# Patient Record
Sex: Female | Born: 1935
Health system: Southern US, Community
[De-identification: ages and names within clinical notes are randomized; demographics above are authoritative.]

## PROBLEM LIST (undated history)

## (undated) DIAGNOSIS — N189 Chronic kidney disease, unspecified: Secondary | ICD-10-CM

## (undated) DIAGNOSIS — Z950 Presence of cardiac pacemaker: Secondary | ICD-10-CM

## (undated) DIAGNOSIS — K219 Gastro-esophageal reflux disease without esophagitis: Secondary | ICD-10-CM

## (undated) DIAGNOSIS — E039 Hypothyroidism, unspecified: Secondary | ICD-10-CM

## (undated) DIAGNOSIS — I480 Paroxysmal atrial fibrillation: Secondary | ICD-10-CM

## (undated) DIAGNOSIS — R809 Proteinuria, unspecified: Secondary | ICD-10-CM

## (undated) DIAGNOSIS — E119 Type 2 diabetes mellitus without complications: Secondary | ICD-10-CM

## (undated) DIAGNOSIS — I10 Essential (primary) hypertension: Secondary | ICD-10-CM

## (undated) DIAGNOSIS — K5790 Diverticulosis of intestine, part unspecified, without perforation or abscess without bleeding: Secondary | ICD-10-CM

## (undated) DIAGNOSIS — E785 Hyperlipidemia, unspecified: Secondary | ICD-10-CM

## (undated) DIAGNOSIS — I639 Cerebral infarction, unspecified: Secondary | ICD-10-CM

## (undated) DIAGNOSIS — I495 Sick sinus syndrome: Secondary | ICD-10-CM

## (undated) HISTORY — DX: Hyperlipidemia, unspecified: E78.5

## (undated) HISTORY — DX: Type 2 diabetes mellitus without complications: E11.9

## (undated) HISTORY — DX: Hypothyroidism, unspecified: E03.9

## (undated) HISTORY — PX: HEMORRHOID SURGERY: SHX153

## (undated) HISTORY — DX: Sick sinus syndrome: I49.5

## (undated) HISTORY — PX: CHOLECYSTECTOMY: SHX55

## (undated) HISTORY — DX: Cerebral infarction, unspecified: I63.9

## (undated) HISTORY — DX: Essential (primary) hypertension: I10

## (undated) HISTORY — PX: OTHER SURGICAL HISTORY: SHX169

## (undated) HISTORY — DX: Proteinuria, unspecified: R80.9

## (undated) HISTORY — DX: Gastro-esophageal reflux disease without esophagitis: K21.9

## (undated) HISTORY — DX: Paroxysmal atrial fibrillation: I48.0

---

## 1997-02-27 DIAGNOSIS — I639 Cerebral infarction, unspecified: Secondary | ICD-10-CM

## 1997-02-27 HISTORY — PX: OTHER SURGICAL HISTORY: SHX169

## 1997-02-27 HISTORY — DX: Cerebral infarction, unspecified: I63.9

## 1997-10-21 ENCOUNTER — Inpatient Hospital Stay (HOSPITAL_COMMUNITY): Admission: RE | Admit: 1997-10-21 | Discharge: 1997-10-24 | Payer: Self-pay | Admitting: Internal Medicine

## 1997-10-27 ENCOUNTER — Inpatient Hospital Stay (HOSPITAL_COMMUNITY): Admission: EM | Admit: 1997-10-27 | Discharge: 1997-11-09 | Payer: Self-pay | Admitting: Internal Medicine

## 1998-04-22 ENCOUNTER — Encounter: Payer: Self-pay | Admitting: Emergency Medicine

## 1998-04-22 ENCOUNTER — Inpatient Hospital Stay (HOSPITAL_COMMUNITY): Admission: EM | Admit: 1998-04-22 | Discharge: 1998-04-23 | Payer: Self-pay | Admitting: Emergency Medicine

## 1999-02-24 ENCOUNTER — Encounter: Payer: Self-pay | Admitting: Gastroenterology

## 1999-02-24 ENCOUNTER — Ambulatory Visit (HOSPITAL_COMMUNITY): Admission: RE | Admit: 1999-02-24 | Discharge: 1999-02-24 | Payer: Self-pay | Admitting: Gastroenterology

## 1999-03-13 ENCOUNTER — Inpatient Hospital Stay (HOSPITAL_COMMUNITY): Admission: EM | Admit: 1999-03-13 | Discharge: 1999-03-13 | Payer: Self-pay | Admitting: Emergency Medicine

## 1999-05-17 ENCOUNTER — Encounter (INDEPENDENT_AMBULATORY_CARE_PROVIDER_SITE_OTHER): Payer: Self-pay

## 1999-05-17 ENCOUNTER — Ambulatory Visit (HOSPITAL_COMMUNITY): Admission: RE | Admit: 1999-05-17 | Discharge: 1999-05-17 | Payer: Self-pay | Admitting: Gastroenterology

## 1999-11-18 ENCOUNTER — Encounter: Admission: RE | Admit: 1999-11-18 | Discharge: 1999-11-18 | Payer: Self-pay

## 1999-11-18 ENCOUNTER — Encounter: Payer: Self-pay | Admitting: Internal Medicine

## 2000-04-01 ENCOUNTER — Emergency Department (HOSPITAL_COMMUNITY): Admission: EM | Admit: 2000-04-01 | Discharge: 2000-04-01 | Payer: Self-pay

## 2000-09-07 ENCOUNTER — Encounter: Payer: Self-pay | Admitting: General Surgery

## 2000-09-10 ENCOUNTER — Encounter (INDEPENDENT_AMBULATORY_CARE_PROVIDER_SITE_OTHER): Payer: Self-pay

## 2000-09-10 ENCOUNTER — Observation Stay (HOSPITAL_COMMUNITY): Admission: RE | Admit: 2000-09-10 | Discharge: 2000-09-11 | Payer: Self-pay | Admitting: General Surgery

## 2002-01-06 ENCOUNTER — Encounter: Payer: Self-pay | Admitting: Family Medicine

## 2002-01-06 ENCOUNTER — Encounter: Admission: RE | Admit: 2002-01-06 | Discharge: 2002-01-06 | Payer: Self-pay | Admitting: Family Medicine

## 2003-01-20 ENCOUNTER — Encounter: Admission: RE | Admit: 2003-01-20 | Discharge: 2003-01-20 | Payer: Self-pay | Admitting: Nurse Practitioner

## 2003-07-10 ENCOUNTER — Ambulatory Visit (HOSPITAL_COMMUNITY): Admission: RE | Admit: 2003-07-10 | Discharge: 2003-07-10 | Payer: Self-pay | Admitting: Internal Medicine

## 2003-10-29 ENCOUNTER — Ambulatory Visit: Payer: Self-pay | Admitting: Nurse Practitioner

## 2003-11-30 ENCOUNTER — Ambulatory Visit: Payer: Self-pay | Admitting: Nurse Practitioner

## 2003-12-21 ENCOUNTER — Ambulatory Visit: Payer: Self-pay | Admitting: Nurse Practitioner

## 2003-12-29 ENCOUNTER — Encounter (INDEPENDENT_AMBULATORY_CARE_PROVIDER_SITE_OTHER): Payer: Self-pay | Admitting: Nurse Practitioner

## 2003-12-29 LAB — CONVERTED CEMR LAB

## 2003-12-30 ENCOUNTER — Ambulatory Visit: Payer: Self-pay | Admitting: Nurse Practitioner

## 2004-01-05 ENCOUNTER — Encounter (INDEPENDENT_AMBULATORY_CARE_PROVIDER_SITE_OTHER): Payer: Self-pay | Admitting: Nurse Practitioner

## 2004-01-05 ENCOUNTER — Other Ambulatory Visit: Admission: RE | Admit: 2004-01-05 | Discharge: 2004-01-05 | Payer: Self-pay | Admitting: Family Medicine

## 2004-01-05 LAB — CONVERTED CEMR LAB: Pap Smear: NORMAL

## 2004-01-28 ENCOUNTER — Ambulatory Visit: Payer: Self-pay | Admitting: Nurse Practitioner

## 2004-01-29 ENCOUNTER — Encounter: Admission: RE | Admit: 2004-01-29 | Discharge: 2004-01-29 | Payer: Self-pay | Admitting: Family Medicine

## 2004-03-02 ENCOUNTER — Ambulatory Visit: Payer: Self-pay | Admitting: Nurse Practitioner

## 2004-03-30 ENCOUNTER — Ambulatory Visit: Payer: Self-pay | Admitting: Nurse Practitioner

## 2004-04-27 ENCOUNTER — Ambulatory Visit: Payer: Self-pay | Admitting: Nurse Practitioner

## 2004-05-02 ENCOUNTER — Ambulatory Visit (HOSPITAL_COMMUNITY): Admission: RE | Admit: 2004-05-02 | Discharge: 2004-05-02 | Payer: Self-pay | Admitting: Internal Medicine

## 2004-05-02 ENCOUNTER — Ambulatory Visit: Payer: Self-pay | Admitting: Nurse Practitioner

## 2004-05-05 ENCOUNTER — Ambulatory Visit: Payer: Self-pay | Admitting: Nurse Practitioner

## 2004-05-30 ENCOUNTER — Ambulatory Visit: Payer: Self-pay | Admitting: Nurse Practitioner

## 2004-06-29 ENCOUNTER — Ambulatory Visit: Payer: Self-pay | Admitting: Nurse Practitioner

## 2004-07-20 ENCOUNTER — Encounter (INDEPENDENT_AMBULATORY_CARE_PROVIDER_SITE_OTHER): Payer: Self-pay | Admitting: Specialist

## 2004-07-20 ENCOUNTER — Ambulatory Visit (HOSPITAL_COMMUNITY): Admission: RE | Admit: 2004-07-20 | Discharge: 2004-07-20 | Payer: Self-pay | Admitting: Gastroenterology

## 2004-08-01 ENCOUNTER — Ambulatory Visit: Payer: Self-pay | Admitting: Nurse Practitioner

## 2004-09-01 ENCOUNTER — Ambulatory Visit: Payer: Self-pay | Admitting: Nurse Practitioner

## 2004-09-29 ENCOUNTER — Ambulatory Visit: Payer: Self-pay | Admitting: Nurse Practitioner

## 2004-10-13 ENCOUNTER — Ambulatory Visit: Payer: Self-pay | Admitting: Nurse Practitioner

## 2004-11-01 ENCOUNTER — Ambulatory Visit: Payer: Self-pay | Admitting: Nurse Practitioner

## 2004-11-09 ENCOUNTER — Ambulatory Visit (HOSPITAL_COMMUNITY): Admission: RE | Admit: 2004-11-09 | Discharge: 2004-11-09 | Payer: Self-pay | Admitting: *Deleted

## 2004-11-15 ENCOUNTER — Ambulatory Visit: Payer: Self-pay | Admitting: Nurse Practitioner

## 2004-11-22 ENCOUNTER — Ambulatory Visit: Payer: Self-pay | Admitting: Nurse Practitioner

## 2004-12-06 ENCOUNTER — Ambulatory Visit: Payer: Self-pay | Admitting: Nurse Practitioner

## 2005-01-03 ENCOUNTER — Ambulatory Visit: Payer: Self-pay | Admitting: Nurse Practitioner

## 2005-01-17 ENCOUNTER — Ambulatory Visit: Payer: Self-pay | Admitting: Internal Medicine

## 2005-02-13 ENCOUNTER — Ambulatory Visit: Payer: Self-pay | Admitting: Nurse Practitioner

## 2005-03-06 ENCOUNTER — Encounter (INDEPENDENT_AMBULATORY_CARE_PROVIDER_SITE_OTHER): Payer: Self-pay | Admitting: Nurse Practitioner

## 2005-03-06 LAB — CONVERTED CEMR LAB: WBC, blood: 7 10*3/uL

## 2005-03-20 ENCOUNTER — Ambulatory Visit: Payer: Self-pay | Admitting: Nurse Practitioner

## 2005-03-22 ENCOUNTER — Ambulatory Visit: Payer: Self-pay | Admitting: Nurse Practitioner

## 2005-04-10 ENCOUNTER — Encounter: Admission: RE | Admit: 2005-04-10 | Discharge: 2005-04-10 | Payer: Self-pay | Admitting: Family Medicine

## 2005-04-20 ENCOUNTER — Ambulatory Visit: Payer: Self-pay | Admitting: Nurse Practitioner

## 2005-05-22 ENCOUNTER — Ambulatory Visit: Payer: Self-pay | Admitting: Nurse Practitioner

## 2005-06-26 ENCOUNTER — Ambulatory Visit: Payer: Self-pay | Admitting: Internal Medicine

## 2005-07-25 ENCOUNTER — Ambulatory Visit: Payer: Self-pay | Admitting: Nurse Practitioner

## 2005-07-31 ENCOUNTER — Ambulatory Visit: Payer: Self-pay | Admitting: Internal Medicine

## 2005-08-02 ENCOUNTER — Ambulatory Visit: Payer: Self-pay | Admitting: Nurse Practitioner

## 2005-08-24 ENCOUNTER — Ambulatory Visit: Payer: Self-pay | Admitting: Nurse Practitioner

## 2005-08-28 ENCOUNTER — Ambulatory Visit: Payer: Self-pay | Admitting: Nurse Practitioner

## 2005-09-20 ENCOUNTER — Ambulatory Visit: Payer: Self-pay | Admitting: Nurse Practitioner

## 2005-10-23 ENCOUNTER — Ambulatory Visit: Payer: Self-pay | Admitting: Nurse Practitioner

## 2005-10-31 ENCOUNTER — Ambulatory Visit: Payer: Self-pay | Admitting: Nurse Practitioner

## 2005-11-14 ENCOUNTER — Ambulatory Visit: Payer: Self-pay | Admitting: Nurse Practitioner

## 2005-11-28 ENCOUNTER — Ambulatory Visit: Payer: Self-pay | Admitting: Nurse Practitioner

## 2005-12-01 ENCOUNTER — Ambulatory Visit: Payer: Self-pay | Admitting: Nurse Practitioner

## 2005-12-01 LAB — CONVERTED CEMR LAB: Hgb A1c MFr Bld: 7.7 %

## 2005-12-15 ENCOUNTER — Ambulatory Visit: Payer: Self-pay | Admitting: Nurse Practitioner

## 2005-12-15 LAB — CONVERTED CEMR LAB: Blood Glucose, Fasting: 175 mg/dL

## 2006-01-15 ENCOUNTER — Ambulatory Visit: Payer: Self-pay | Admitting: Nurse Practitioner

## 2006-02-12 ENCOUNTER — Ambulatory Visit: Payer: Self-pay | Admitting: Nurse Practitioner

## 2006-03-05 ENCOUNTER — Ambulatory Visit: Payer: Self-pay | Admitting: Nurse Practitioner

## 2006-03-05 LAB — CONVERTED CEMR LAB: TSH: 3.436 microintl units/mL

## 2006-03-06 ENCOUNTER — Encounter (INDEPENDENT_AMBULATORY_CARE_PROVIDER_SITE_OTHER): Payer: Self-pay | Admitting: Nurse Practitioner

## 2006-03-06 LAB — CONVERTED CEMR LAB
RBC count: 4.91 10*6/uL
WBC, blood: 4.91 10*3/uL
WBC, blood: 7 10*3/uL

## 2006-03-27 ENCOUNTER — Ambulatory Visit: Payer: Self-pay | Admitting: Nurse Practitioner

## 2006-04-02 ENCOUNTER — Ambulatory Visit: Payer: Self-pay | Admitting: Nurse Practitioner

## 2006-04-30 ENCOUNTER — Ambulatory Visit: Payer: Self-pay | Admitting: Nurse Practitioner

## 2006-04-30 LAB — CONVERTED CEMR LAB: INR: 3.3

## 2006-05-16 ENCOUNTER — Encounter (INDEPENDENT_AMBULATORY_CARE_PROVIDER_SITE_OTHER): Payer: Self-pay | Admitting: Nurse Practitioner

## 2006-05-16 DIAGNOSIS — I6789 Other cerebrovascular disease: Secondary | ICD-10-CM | POA: Insufficient documentation

## 2006-05-16 DIAGNOSIS — E119 Type 2 diabetes mellitus without complications: Secondary | ICD-10-CM | POA: Insufficient documentation

## 2006-05-16 DIAGNOSIS — K649 Unspecified hemorrhoids: Secondary | ICD-10-CM | POA: Insufficient documentation

## 2006-05-17 ENCOUNTER — Encounter: Admission: RE | Admit: 2006-05-17 | Discharge: 2006-05-17 | Payer: Self-pay | Admitting: Nurse Practitioner

## 2006-05-28 ENCOUNTER — Ambulatory Visit: Payer: Self-pay | Admitting: Nurse Practitioner

## 2006-06-04 ENCOUNTER — Ambulatory Visit: Payer: Self-pay | Admitting: Nurse Practitioner

## 2006-06-25 ENCOUNTER — Ambulatory Visit: Payer: Self-pay | Admitting: Nurse Practitioner

## 2006-07-25 ENCOUNTER — Ambulatory Visit: Payer: Self-pay | Admitting: Nurse Practitioner

## 2006-07-28 DIAGNOSIS — I10 Essential (primary) hypertension: Secondary | ICD-10-CM | POA: Insufficient documentation

## 2006-07-28 DIAGNOSIS — I428 Other cardiomyopathies: Secondary | ICD-10-CM | POA: Insufficient documentation

## 2006-07-28 DIAGNOSIS — E785 Hyperlipidemia, unspecified: Secondary | ICD-10-CM | POA: Insufficient documentation

## 2006-08-22 ENCOUNTER — Ambulatory Visit: Payer: Self-pay | Admitting: Family Medicine

## 2006-09-19 ENCOUNTER — Ambulatory Visit: Payer: Self-pay | Admitting: Nurse Practitioner

## 2006-10-02 ENCOUNTER — Ambulatory Visit: Payer: Self-pay | Admitting: Nurse Practitioner

## 2006-10-16 ENCOUNTER — Ambulatory Visit: Payer: Self-pay | Admitting: Nurse Practitioner

## 2006-11-13 ENCOUNTER — Ambulatory Visit: Payer: Self-pay | Admitting: Nurse Practitioner

## 2006-12-18 ENCOUNTER — Encounter (INDEPENDENT_AMBULATORY_CARE_PROVIDER_SITE_OTHER): Payer: Self-pay | Admitting: Nurse Practitioner

## 2006-12-18 ENCOUNTER — Ambulatory Visit: Payer: Self-pay | Admitting: Internal Medicine

## 2006-12-18 LAB — CONVERTED CEMR LAB
ALT: 19 units/L (ref 0–35)
AST: 19 units/L (ref 0–37)
Albumin: 4.2 g/dL (ref 3.5–5.2)
Alkaline Phosphatase: 75 units/L (ref 39–117)
BUN: 16 mg/dL (ref 6–23)
Basophils Absolute: 0.1 10*3/uL (ref 0.0–0.1)
Basophils Relative: 1 % (ref 0–1)
CO2: 22 meq/L (ref 19–32)
Calcium: 9 mg/dL (ref 8.4–10.5)
Chloride: 103 meq/L (ref 96–112)
Cholesterol: 182 mg/dL (ref 0–200)
Creatinine, Ser: 0.83 mg/dL (ref 0.40–1.20)
Eosinophils Absolute: 0.5 10*3/uL (ref 0.0–0.7)
Eosinophils Relative: 9 % — ABNORMAL HIGH (ref 0–5)
Glucose, Bld: 188 mg/dL — ABNORMAL HIGH (ref 70–99)
HCT: 43.1 % (ref 36.0–46.0)
HDL: 42 mg/dL (ref >39)
Hemoglobin: 13.6 g/dL (ref 12.0–15.0)
LDL Cholesterol: 87 mg/dL (ref 0–99)
Lymphocytes Relative: 22 % (ref 12–46)
Lymphs Abs: 1.2 10*3/uL (ref 0.7–3.3)
MCHC: 31.6 g/dL (ref 30.0–36.0)
MCV: 87.1 fL (ref 78.0–100.0)
Monocytes Absolute: 0.4 10*3/uL (ref 0.2–0.7)
Monocytes Relative: 8 % (ref 3–11)
Neutro Abs: 3.3 10*3/uL (ref 1.7–7.7)
Neutrophils Relative %: 60 % (ref 43–77)
Platelets: 224 10*3/uL (ref 150–400)
Potassium: 4.4 meq/L (ref 3.5–5.3)
RBC: 4.95 M/uL (ref 3.87–5.11)
RDW: 16 % — ABNORMAL HIGH (ref 11.5–14.0)
Sodium: 142 meq/L (ref 135–145)
TSH: 5.953 microintl units/mL — ABNORMAL HIGH (ref 0.350–5.50)
Total Bilirubin: 0.5 mg/dL (ref 0.3–1.2)
Total CHOL/HDL Ratio: 4.3
Total Protein: 7.5 g/dL (ref 6.0–8.3)
Triglycerides: 265 mg/dL — ABNORMAL HIGH (ref <150)
VLDL: 53 mg/dL — ABNORMAL HIGH (ref 0–40)
WBC: 5.5 10*3/uL (ref 4.0–10.5)

## 2007-01-15 ENCOUNTER — Ambulatory Visit: Payer: Self-pay | Admitting: Nurse Practitioner

## 2007-02-12 ENCOUNTER — Ambulatory Visit: Payer: Self-pay | Admitting: Nurse Practitioner

## 2007-02-19 ENCOUNTER — Ambulatory Visit: Payer: Self-pay | Admitting: Nurse Practitioner

## 2007-02-27 ENCOUNTER — Ambulatory Visit: Payer: Self-pay | Admitting: Nurse Practitioner

## 2007-03-27 ENCOUNTER — Ambulatory Visit: Payer: Self-pay | Admitting: Nurse Practitioner

## 2007-04-12 ENCOUNTER — Ambulatory Visit: Payer: Self-pay | Admitting: Internal Medicine

## 2007-04-12 ENCOUNTER — Encounter (INDEPENDENT_AMBULATORY_CARE_PROVIDER_SITE_OTHER): Payer: Self-pay | Admitting: Nurse Practitioner

## 2007-04-12 LAB — CONVERTED CEMR LAB
ALT: 30 units/L (ref 0–35)
AST: 16 units/L (ref 0–37)
Albumin: 4.1 g/dL (ref 3.5–5.2)
Alkaline Phosphatase: 82 units/L (ref 39–117)
BUN: 16 mg/dL (ref 6–23)
Basophils Absolute: 0 10*3/uL (ref 0.0–0.1)
Basophils Relative: 1 % (ref 0–1)
CO2: 19 meq/L (ref 19–32)
Calcium: 9.4 mg/dL (ref 8.4–10.5)
Chloride: 100 meq/L (ref 96–112)
Creatinine, Ser: 0.76 mg/dL (ref 0.40–1.20)
Eosinophils Absolute: 0.5 10*3/uL (ref 0.0–0.7)
Eosinophils Relative: 7 % — ABNORMAL HIGH (ref 0–5)
Glucose, Bld: 193 mg/dL — ABNORMAL HIGH (ref 70–99)
HCT: 41.4 % (ref 36.0–46.0)
Hemoglobin: 13.2 g/dL (ref 12.0–15.0)
Lymphocytes Relative: 20 % (ref 12–46)
Lymphs Abs: 1.5 10*3/uL (ref 0.7–4.0)
MCHC: 31.9 g/dL (ref 30.0–36.0)
MCV: 87.2 fL (ref 78.0–100.0)
Monocytes Absolute: 0.5 10*3/uL (ref 0.1–1.0)
Monocytes Relative: 6 % (ref 3–12)
Neutro Abs: 5.1 10*3/uL (ref 1.7–7.7)
Neutrophils Relative %: 67 % (ref 43–77)
Platelets: 215 10*3/uL (ref 150–400)
Potassium: 4.2 meq/L (ref 3.5–5.3)
RBC: 4.75 M/uL (ref 3.87–5.11)
RDW: 15.8 % — ABNORMAL HIGH (ref 11.5–15.5)
Sodium: 133 meq/L — ABNORMAL LOW (ref 135–145)
TSH: 3.208 microintl units/mL (ref 0.350–5.50)
Total Bilirubin: 0.5 mg/dL (ref 0.3–1.2)
Total Protein: 7.5 g/dL (ref 6.0–8.3)
WBC: 7.6 10*3/uL (ref 4.0–10.5)

## 2007-04-24 ENCOUNTER — Ambulatory Visit: Payer: Self-pay | Admitting: Internal Medicine

## 2007-05-22 ENCOUNTER — Ambulatory Visit: Payer: Self-pay | Admitting: Nurse Practitioner

## 2007-05-29 ENCOUNTER — Encounter: Admission: RE | Admit: 2007-05-29 | Discharge: 2007-05-29 | Payer: Self-pay | Admitting: Family Medicine

## 2007-06-05 ENCOUNTER — Ambulatory Visit: Payer: Self-pay | Admitting: Internal Medicine

## 2007-07-03 ENCOUNTER — Ambulatory Visit: Payer: Self-pay | Admitting: Nurse Practitioner

## 2007-07-31 ENCOUNTER — Ambulatory Visit: Payer: Self-pay | Admitting: Nurse Practitioner

## 2007-08-28 ENCOUNTER — Ambulatory Visit: Payer: Self-pay | Admitting: Internal Medicine

## 2007-09-25 ENCOUNTER — Ambulatory Visit: Payer: Self-pay | Admitting: Family Medicine

## 2007-09-25 ENCOUNTER — Encounter (INDEPENDENT_AMBULATORY_CARE_PROVIDER_SITE_OTHER): Payer: Self-pay | Admitting: Internal Medicine

## 2007-09-25 LAB — CONVERTED CEMR LAB
INR: 2.4 — ABNORMAL HIGH (ref 0.0–1.5)
Prothrombin Time: 27.7 s — ABNORMAL HIGH (ref 11.6–15.2)

## 2007-10-23 ENCOUNTER — Ambulatory Visit: Payer: Self-pay | Admitting: Internal Medicine

## 2007-10-23 LAB — CONVERTED CEMR LAB
INR: 2.4 — ABNORMAL HIGH (ref 0.0–1.5)
Prothrombin Time: 27.4 s — ABNORMAL HIGH (ref 11.6–15.2)

## 2007-11-20 ENCOUNTER — Ambulatory Visit: Payer: Self-pay | Admitting: Family Medicine

## 2007-11-20 ENCOUNTER — Encounter (INDEPENDENT_AMBULATORY_CARE_PROVIDER_SITE_OTHER): Payer: Self-pay | Admitting: Internal Medicine

## 2007-11-20 LAB — CONVERTED CEMR LAB
INR: 2.4 — ABNORMAL HIGH (ref 0.0–1.5)
Prothrombin Time: 27.6 s — ABNORMAL HIGH (ref 11.6–15.2)

## 2007-12-18 ENCOUNTER — Ambulatory Visit: Payer: Self-pay | Admitting: Internal Medicine

## 2007-12-18 ENCOUNTER — Encounter (INDEPENDENT_AMBULATORY_CARE_PROVIDER_SITE_OTHER): Payer: Self-pay | Admitting: Internal Medicine

## 2007-12-18 ENCOUNTER — Ambulatory Visit: Payer: Self-pay | Admitting: Family Medicine

## 2007-12-18 LAB — CONVERTED CEMR LAB
Basophils Absolute: 0 10*3/uL (ref 0.0–0.1)
Basophils Relative: 1 % (ref 0–1)
Eosinophils Absolute: 0.6 10*3/uL (ref 0.0–0.7)
Eosinophils Relative: 9 % — ABNORMAL HIGH (ref 0–5)
HCT: 40.1 % (ref 36.0–46.0)
Hemoglobin: 12.8 g/dL (ref 12.0–15.0)
INR: 2.4 — ABNORMAL HIGH (ref 0.0–1.5)
Lymphocytes Relative: 21 % (ref 12–46)
Lymphs Abs: 1.5 10*3/uL (ref 0.7–4.0)
MCHC: 31.9 g/dL (ref 30.0–36.0)
MCV: 83.5 fL (ref 78.0–100.0)
Monocytes Absolute: 0.4 10*3/uL (ref 0.1–1.0)
Monocytes Relative: 6 % (ref 3–12)
Neutro Abs: 4.5 10*3/uL (ref 1.7–7.7)
Neutrophils Relative %: 64 % (ref 43–77)
Platelets: 238 10*3/uL (ref 150–400)
Prothrombin Time: 28.1 s — ABNORMAL HIGH (ref 11.6–15.2)
RBC: 4.8 M/uL (ref 3.87–5.11)
RDW: 16.4 % — ABNORMAL HIGH (ref 11.5–15.5)
TSH: 2.738 microintl units/mL (ref 0.350–4.50)
WBC: 7.1 10*3/uL (ref 4.0–10.5)

## 2008-01-15 ENCOUNTER — Ambulatory Visit: Payer: Self-pay | Admitting: Family Medicine

## 2008-01-15 ENCOUNTER — Encounter (INDEPENDENT_AMBULATORY_CARE_PROVIDER_SITE_OTHER): Payer: Self-pay | Admitting: Internal Medicine

## 2008-01-15 LAB — CONVERTED CEMR LAB
INR: 2.6 — ABNORMAL HIGH (ref 0.0–1.5)
Prothrombin Time: 30 s — ABNORMAL HIGH (ref 11.6–15.2)

## 2008-02-06 ENCOUNTER — Ambulatory Visit: Payer: Self-pay | Admitting: Family Medicine

## 2008-03-04 ENCOUNTER — Ambulatory Visit: Payer: Self-pay | Admitting: Internal Medicine

## 2008-03-04 ENCOUNTER — Encounter (INDEPENDENT_AMBULATORY_CARE_PROVIDER_SITE_OTHER): Payer: Self-pay | Admitting: Internal Medicine

## 2008-03-04 LAB — CONVERTED CEMR LAB
ALT: 16 units/L (ref 0–35)
AST: 12 units/L (ref 0–37)
Albumin: 4 g/dL (ref 3.5–5.2)
Alkaline Phosphatase: 73 units/L (ref 39–117)
BUN: 12 mg/dL (ref 6–23)
CO2: 24 meq/L (ref 19–32)
Calcium: 9 mg/dL (ref 8.4–10.5)
Chloride: 105 meq/L (ref 96–112)
Cholesterol: 98 mg/dL (ref 0–200)
Creatinine, Ser: 0.74 mg/dL (ref 0.40–1.20)
Glucose, Bld: 172 mg/dL — ABNORMAL HIGH (ref 70–99)
HDL: 50 mg/dL (ref >39)
LDL Cholesterol: 28 mg/dL (ref 0–99)
Potassium: 4.7 meq/L (ref 3.5–5.3)
Sodium: 142 meq/L (ref 135–145)
Total Bilirubin: 0.6 mg/dL (ref 0.3–1.2)
Total CHOL/HDL Ratio: 2
Total Protein: 7.3 g/dL (ref 6.0–8.3)
Triglycerides: 99 mg/dL (ref <150)
VLDL: 20 mg/dL (ref 0–40)

## 2008-03-12 ENCOUNTER — Ambulatory Visit (HOSPITAL_COMMUNITY): Admission: RE | Admit: 2008-03-12 | Discharge: 2008-03-12 | Payer: Self-pay | Admitting: Family Medicine

## 2008-04-01 ENCOUNTER — Ambulatory Visit: Payer: Self-pay | Admitting: Internal Medicine

## 2008-04-29 ENCOUNTER — Ambulatory Visit: Payer: Self-pay | Admitting: Internal Medicine

## 2008-06-01 ENCOUNTER — Ambulatory Visit: Payer: Self-pay | Admitting: Internal Medicine

## 2008-06-15 ENCOUNTER — Ambulatory Visit: Payer: Self-pay | Admitting: *Deleted

## 2008-06-18 ENCOUNTER — Encounter: Admission: RE | Admit: 2008-06-18 | Discharge: 2008-06-18 | Payer: Self-pay | Admitting: Family Medicine

## 2008-07-06 ENCOUNTER — Ambulatory Visit: Payer: Self-pay | Admitting: Internal Medicine

## 2008-08-03 ENCOUNTER — Ambulatory Visit: Payer: Self-pay | Admitting: Internal Medicine

## 2008-09-01 ENCOUNTER — Ambulatory Visit: Payer: Self-pay | Admitting: Internal Medicine

## 2008-10-05 ENCOUNTER — Ambulatory Visit: Payer: Self-pay | Admitting: Family Medicine

## 2008-10-05 ENCOUNTER — Ambulatory Visit: Payer: Self-pay | Admitting: Internal Medicine

## 2008-10-27 ENCOUNTER — Ambulatory Visit: Payer: Self-pay | Admitting: Internal Medicine

## 2008-11-10 ENCOUNTER — Ambulatory Visit: Payer: Self-pay | Admitting: Family Medicine

## 2008-11-24 ENCOUNTER — Ambulatory Visit: Payer: Self-pay | Admitting: Family Medicine

## 2008-12-14 ENCOUNTER — Encounter (INDEPENDENT_AMBULATORY_CARE_PROVIDER_SITE_OTHER): Payer: Self-pay | Admitting: Internal Medicine

## 2008-12-14 ENCOUNTER — Ambulatory Visit: Payer: Self-pay | Admitting: Internal Medicine

## 2008-12-14 ENCOUNTER — Ambulatory Visit: Payer: Self-pay | Admitting: Family Medicine

## 2008-12-14 LAB — CONVERTED CEMR LAB
ALT: 16 units/L (ref 0–35)
AST: 14 units/L (ref 0–37)
Albumin: 4.5 g/dL (ref 3.5–5.2)
Alkaline Phosphatase: 61 units/L (ref 39–117)
BUN: 19 mg/dL (ref 6–23)
CO2: 20 meq/L (ref 19–32)
Calcium: 9.7 mg/dL (ref 8.4–10.5)
Chloride: 107 meq/L (ref 96–112)
Cholesterol: 92 mg/dL (ref 0–200)
Creatinine, Ser: 0.89 mg/dL (ref 0.40–1.20)
Glucose, Bld: 155 mg/dL — ABNORMAL HIGH (ref 70–99)
HDL: 39 mg/dL — ABNORMAL LOW (ref >39)
LDL Cholesterol: 28 mg/dL (ref 0–99)
Potassium: 4.8 meq/L (ref 3.5–5.3)
Sodium: 144 meq/L (ref 135–145)
Total Bilirubin: 0.5 mg/dL (ref 0.3–1.2)
Total CHOL/HDL Ratio: 2.4
Total Protein: 7.3 g/dL (ref 6.0–8.3)
Triglycerides: 123 mg/dL (ref <150)
VLDL: 25 mg/dL (ref 0–40)

## 2008-12-16 ENCOUNTER — Encounter (INDEPENDENT_AMBULATORY_CARE_PROVIDER_SITE_OTHER): Payer: Self-pay | Admitting: Internal Medicine

## 2008-12-16 LAB — CONVERTED CEMR LAB: TSH: 2.393 microintl units/mL (ref 0.350–4.500)

## 2008-12-28 ENCOUNTER — Ambulatory Visit: Payer: Self-pay | Admitting: Internal Medicine

## 2008-12-28 ENCOUNTER — Ambulatory Visit: Payer: Self-pay | Admitting: Family Medicine

## 2009-01-25 ENCOUNTER — Ambulatory Visit: Payer: Self-pay | Admitting: Internal Medicine

## 2009-02-24 ENCOUNTER — Ambulatory Visit: Payer: Self-pay | Admitting: Internal Medicine

## 2009-03-24 ENCOUNTER — Ambulatory Visit: Payer: Self-pay | Admitting: Internal Medicine

## 2009-03-31 ENCOUNTER — Ambulatory Visit: Payer: Self-pay | Admitting: Family Medicine

## 2009-03-31 ENCOUNTER — Encounter (INDEPENDENT_AMBULATORY_CARE_PROVIDER_SITE_OTHER): Payer: Self-pay | Admitting: Adult Health

## 2009-04-12 ENCOUNTER — Ambulatory Visit: Payer: Self-pay | Admitting: Internal Medicine

## 2009-04-20 ENCOUNTER — Ambulatory Visit: Payer: Self-pay | Admitting: Internal Medicine

## 2009-04-21 ENCOUNTER — Ambulatory Visit: Payer: Self-pay | Admitting: Family Medicine

## 2009-04-21 ENCOUNTER — Encounter (INDEPENDENT_AMBULATORY_CARE_PROVIDER_SITE_OTHER): Payer: Self-pay | Admitting: Family Medicine

## 2009-05-05 ENCOUNTER — Ambulatory Visit: Payer: Self-pay | Admitting: Family Medicine

## 2009-05-19 ENCOUNTER — Ambulatory Visit: Payer: Self-pay | Admitting: Family Medicine

## 2009-06-08 ENCOUNTER — Ambulatory Visit: Payer: Self-pay | Admitting: Internal Medicine

## 2009-06-08 LAB — CONVERTED CEMR LAB
Cholesterol: 95 mg/dL (ref 0–200)
HDL: 43 mg/dL (ref >39)
Hgb A1c MFr Bld: 8.1 % — ABNORMAL HIGH (ref 4.6–6.1)
LDL Cholesterol: 35 mg/dL (ref 0–99)
Microalb, Ur: 0.51 mg/dL (ref 0.00–1.89)
Total CHOL/HDL Ratio: 2.2
Triglycerides: 87 mg/dL (ref <150)
VLDL: 17 mg/dL (ref 0–40)

## 2009-06-14 ENCOUNTER — Ambulatory Visit: Payer: Self-pay | Admitting: Internal Medicine

## 2009-06-14 LAB — CONVERTED CEMR LAB
BUN: 17 mg/dL (ref 6–23)
CO2: 21 meq/L (ref 19–32)
Calcium: 9.7 mg/dL (ref 8.4–10.5)
Chloride: 105 meq/L (ref 96–112)
Creatinine, Ser: 0.82 mg/dL (ref 0.40–1.20)
Glucose, Bld: 210 mg/dL — ABNORMAL HIGH (ref 70–99)
Potassium: 4.5 meq/L (ref 3.5–5.3)
Sodium: 139 meq/L (ref 135–145)

## 2009-06-22 ENCOUNTER — Encounter: Admission: RE | Admit: 2009-06-22 | Discharge: 2009-06-22 | Payer: Self-pay | Admitting: Family Medicine

## 2009-06-28 ENCOUNTER — Ambulatory Visit: Payer: Self-pay | Admitting: Internal Medicine

## 2009-07-27 ENCOUNTER — Ambulatory Visit: Payer: Self-pay | Admitting: Internal Medicine

## 2009-08-24 ENCOUNTER — Ambulatory Visit: Payer: Self-pay | Admitting: Internal Medicine

## 2009-08-31 ENCOUNTER — Ambulatory Visit: Payer: Self-pay | Admitting: Internal Medicine

## 2009-08-31 LAB — CONVERTED CEMR LAB
Direct LDL: 45 mg/dL
Hgb A1c MFr Bld: 7.9 % — ABNORMAL HIGH (ref <5.7)

## 2009-09-21 ENCOUNTER — Ambulatory Visit: Payer: Self-pay | Admitting: Internal Medicine

## 2009-10-05 ENCOUNTER — Ambulatory Visit: Payer: Self-pay | Admitting: Internal Medicine

## 2009-11-02 ENCOUNTER — Ambulatory Visit: Payer: Self-pay | Admitting: Internal Medicine

## 2009-11-09 ENCOUNTER — Ambulatory Visit: Payer: Self-pay | Admitting: Internal Medicine

## 2009-11-09 LAB — CONVERTED CEMR LAB
INR: 1.96 — ABNORMAL HIGH (ref <1.50)
Prothrombin Time: 22.5 s — ABNORMAL HIGH (ref 11.6–15.2)

## 2009-11-20 ENCOUNTER — Encounter: Payer: Self-pay | Admitting: Internal Medicine

## 2009-11-23 ENCOUNTER — Encounter (INDEPENDENT_AMBULATORY_CARE_PROVIDER_SITE_OTHER): Payer: Self-pay | Admitting: Internal Medicine

## 2009-11-23 LAB — CONVERTED CEMR LAB
INR: 1.89 — ABNORMAL HIGH (ref <1.50)
Prothrombin Time: 21.9 s — ABNORMAL HIGH (ref 11.6–15.2)

## 2009-12-06 ENCOUNTER — Ambulatory Visit: Payer: Self-pay | Admitting: Internal Medicine

## 2009-12-07 ENCOUNTER — Encounter (INDEPENDENT_AMBULATORY_CARE_PROVIDER_SITE_OTHER): Payer: Self-pay | Admitting: *Deleted

## 2009-12-07 LAB — CONVERTED CEMR LAB
INR: 2.38 — ABNORMAL HIGH (ref <1.50)
Prothrombin Time: 26.1 s — ABNORMAL HIGH (ref 11.6–15.2)

## 2009-12-28 ENCOUNTER — Encounter (INDEPENDENT_AMBULATORY_CARE_PROVIDER_SITE_OTHER): Payer: Self-pay | Admitting: Family Medicine

## 2009-12-28 LAB — CONVERTED CEMR LAB
INR: 2.36 — ABNORMAL HIGH (ref <1.50)
Prothrombin Time: 25.9 s — ABNORMAL HIGH (ref 11.6–15.2)

## 2010-01-05 ENCOUNTER — Ambulatory Visit: Payer: Self-pay | Admitting: Internal Medicine

## 2010-01-05 DIAGNOSIS — I4891 Unspecified atrial fibrillation: Secondary | ICD-10-CM | POA: Insufficient documentation

## 2010-03-20 ENCOUNTER — Encounter: Payer: Self-pay | Admitting: Family Medicine

## 2010-03-30 ENCOUNTER — Encounter (INDEPENDENT_AMBULATORY_CARE_PROVIDER_SITE_OTHER): Payer: Self-pay | Admitting: *Deleted

## 2010-03-30 LAB — CONVERTED CEMR LAB
ALT: 17 units/L (ref 0–35)
AST: 19 units/L (ref 0–37)
Albumin: 4.2 g/dL (ref 3.5–5.2)
Alkaline Phosphatase: 73 units/L (ref 39–117)
BUN: 17 mg/dL (ref 6–23)
Basophils Absolute: 0 10*3/uL (ref 0.0–0.1)
Basophils Relative: 1 % (ref 0–1)
CO2: 23 meq/L (ref 19–32)
Calcium: 9.9 mg/dL (ref 8.4–10.5)
Chloride: 103 meq/L (ref 96–112)
Creatinine, Ser: 0.92 mg/dL (ref 0.40–1.20)
Eosinophils Absolute: 0.4 10*3/uL (ref 0.0–0.7)
Eosinophils Relative: 7 % — ABNORMAL HIGH (ref 0–5)
Glucose, Bld: 207 mg/dL — ABNORMAL HIGH (ref 70–99)
HCT: 39.5 % (ref 36.0–46.0)
Hemoglobin: 12.8 g/dL (ref 12.0–15.0)
Lymphocytes Relative: 17 % (ref 12–46)
Lymphs Abs: 1 10*3/uL (ref 0.7–4.0)
MCHC: 32.4 g/dL (ref 30.0–36.0)
MCV: 84.8 fL (ref 78.0–100.0)
Monocytes Absolute: 0.4 10*3/uL (ref 0.1–1.0)
Monocytes Relative: 7 % (ref 3–12)
Neutro Abs: 4 10*3/uL (ref 1.7–7.7)
Neutrophils Relative %: 68 % (ref 43–77)
Platelets: 211 10*3/uL (ref 150–400)
Potassium: 4.5 meq/L (ref 3.5–5.3)
RBC: 4.66 M/uL (ref 3.87–5.11)
RDW: 16.2 % — ABNORMAL HIGH (ref 11.5–15.5)
Sodium: 140 meq/L (ref 135–145)
Total Bilirubin: 0.4 mg/dL (ref 0.3–1.2)
Total Protein: 7.5 g/dL (ref 6.0–8.3)
WBC: 5.9 10*3/uL (ref 4.0–10.5)

## 2010-03-31 NOTE — Cardiovascular Report (Signed)
Summary: Office Visit   Office Visit   Imported By: Sallee Provencal 01/14/2010 16:40:57  _____________________________________________________________________  External Attachment:    Type:   Image     Comment:   External Document

## 2010-03-31 NOTE — Procedures (Signed)
Summary: pacer check/medtronic   Current Medications (verified): 1)  Tiazac 180 Mg Cp24 (Diltiazem Hcl Er Beads) .... Once Daily 2)  Glucophage 500 Mg Tabs (Metformin Hcl) .... Bid 3)  Toprol Xl 50 Mg Tb24 (Metoprolol Succinate) .... Take 1 Tablet By Mouth Two Times A Day 4)  Ranitidine Hcl 150 Mg Caps (Ranitidine Hcl) .... Two Times A Day 5)  Crestor 10 Mg Tabs (Rosuvastatin Calcium) .... Once Daily 6)  Klor-Con 20 Meq Pack (Potassium Chloride) .... Once Daily 7)  Furosemide 20 Mg Tabs (Furosemide) .Marland Kitchen.. 1 Tablet By Mouth Once Daily 8)  Coumadin  Tabs (Warfarin Sodium Tabs) .... Dose Per Pharmacy 9)  Levothyroxine Sodium 25 Mcg Tabs (Levothyroxine Sodium) .... Take 1 Tablet By Mouth Once Daily  Allergies (verified): No Known Drug Allergies  PPM Specifications Following MD:  Thompson Grayer, MD     Referring MD:  Geanie Cooley PPM Vendor:  Medtronic     PPM Model Number:  [id number]     PPM Serial Number:  [id number] PPM DOI:  11/09/2004     PPM Implanting MD:  Sinda Du  Lead 1    Location: RA     DOI: 11/03/1997     Model #: 438-01     Serial #: [id number]     Status: active Lead 2    Location: RV     DOI: 11/03/1997     Model #: [id number]     Serial #: [id number]     Status: active  Magnet Response Rate:  BOL 85 ERI 65  Indications:  Brady   PPM Follow Up Battery Voltage:  2.95 V       PPM Device Measurements Atrium  Amplitude: 0.2 mV, Impedance: 312 ohms, Threshold: 0.5 V at 0.4 msec Right Ventricle  Amplitude: 6.4 mV, Impedance: 272 ohms, Threshold: 1.5 V at 0.4 msec  Episodes MS Episodes:  462     Percent Mode Switch:  19.3%     Ventricular High Rate:  9     Atrial Pacing:  64.8%     Ventricular Pacing:  14.4%  Parameters Mode:  MVP     Lower Rate Limit:  60     Upper Rate Limit:  130 Paced AV Delay:  180     Sensed AV Delay:  150 Next Cardiology Appt Due:  05/30/2010 Tech Comments:  462 AF EPISODES---221 TREATED EPISODES.  LONGEST EPISODE 7 MINUTES.  + COUMADIN. NORMAL DEVICE  FUNCTION.  PT HR AT TODAYS CHECK 130.  DR Martinique IS PT'S CARDIOLOGIST--WILL FAX OVER CHECK.  TURNED OFF ATRIAL THERAPIES.  CHANGED RA OUTPUT FROM 1.5 TO 2.00 AND RV OUTPUT FROM 2.0 TO 3.00 V DUE TO RV THRESHOLD. SPOKE W/PT ABOUT CARELINK AND AT THIS TIME PT IS NOT INTERESTED.  PT PREFERS TO COME IN OFFICE.  ROV IN 6 MTHS W/JA. Shelly Bombard  December 06, 2009 9:44 AM   Patient Instructions: 1)  Your physician recommends that you schedule a follow-up appointment in: 6 months with Dr Rayann Heman.     Appended Document: pacer check/medtronic She appears to be tracking an atrial tachycardia.  AT therapy at times appears to be successful. I would like to see the patient to determine the next appropriate treatment.  I would favor considering turning atrial tachycardia therapies back on, decreasing upper tracking rate, and adjusting medicine. Please schedule her to see me in next few weeks.  Appended Document: pacer check/medtronic pt scheduled for ov w/ja on 01-05-10 @ 930.  pt aware of  appt. Shelly Bombard

## 2010-03-31 NOTE — Miscellaneous (Signed)
Summary: Device preload  Clinical Lists Changes  Observations: Added new observation of PPM INDICATN: Brady (11/20/2009 12:01) Added new observation of MAGNET RTE: BOL 85 ERI 65 (11/20/2009 12:01) Added new observation of PPMLEADSTAT2: active (11/20/2009 12:01) Added new observation of PPMLEADSER2: [id number]  (11/20/2009 12:01) Added new observation of PPMLEADMOD2: 438-10  (11/20/2009 12:01) Added new observation of PPMLEADDOI2: 11/03/1997  (11/20/2009 12:01) Added new observation of PPMLEADLOC2: RV  (11/20/2009 12:01) Added new observation of PPMLEADSTAT1: active  (11/20/2009 12:01) Added new observation of PPMLEADSER1: 33421  (11/20/2009 12:01) Added new observation of PPMLEADMOD1: 438-01  (11/20/2009 12:01) Added new observation of PPMLEADDOI1: 11/03/1997  (11/20/2009 12:01) Added new observation of PPMLEADLOC1: RA  (11/20/2009 12:01) Added new observation of PPM IMP MD: Sinda Du  (11/20/2009 12:01) Added new observation of PPM DOI: 11/09/2004  (11/20/2009 12:01) Added new observation of PPM SERL#: [id number]  (11/20/2009 12:01) Added new observation of PPM MODL#: [id number]  (11/20/2009 12:01) Added new observation of PACEMAKERMFG: Medtronic  (11/20/2009 12:01) Added new observation of PPM REFER MD: Geanie Cooley  (11/20/2009 12:01) Added new observation of PACEMAKER MD: Thompson Grayer, MD  (11/20/2009 12:01)      PPM Specifications Following MD:  Thompson Grayer, MD     Referring MD:  Geanie Cooley PPM Vendor:  Medtronic     PPM Model Number:  [id number]     PPM Serial Number:  [id number] H PPM DOI:  11/09/2004     PPM Implanting MD:  Sinda Du  Lead 1    Location: RA     DOI: 11/03/1997     Model #: 438-01     Serial #: [id number]     Status: active Lead 2    Location: RV     DOI: 11/03/1997     Model #: [id number]     Serial #: [id number]     Status: active  Magnet Response Rate:  BOL 85 ERI 65  Indications:  [name]

## 2010-03-31 NOTE — Assessment & Plan Note (Signed)
Summary: tachaycardia/mt   Visit Type:  Follow-up Referring Tiffany [name]:  Dr Martinique Primary Tiffany [name]:  Prudencio Pair   History of Present Illness: Tiffany [name] is a pleasant 77 yo WF with a h/o prior CVA, atrial fibrillation/ atrial tachycardia, DM, and bradycardia s/p PPM who presents today to establish care in the EP clinic.  She underwent initial pacemaker implantation 1999 for SSS.  Her most recent MDT pacemaker generator was placed by Dr Verlon Setting in 2006.   Presently, she is doing quite well.  The patient denies symptoms of palpitations, chest pain, orthopnea, PND, lower extremity edema, dizziness, presyncope, syncope, or neurologic sequela.   She reports dypsnea with ambulation to the mail box which is stable.  The patient is tolerating medications without difficulties and is otherwise without complaint today.   Current Medications (verified): 1)  Tiazac 180 Mg Cp24 (Diltiazem Hcl Er Beads) .... Once Daily 2)  Metformin Hcl 1000 Mg Tabs (Metformin Hcl) .... Two Times A Day 3)  Metoprolol Tartrate 25 Mg Tabs (Metoprolol Tartrate) .... Take One Tablet By Mouth Twice A Day 4)  Ranitidine Hcl 150 Mg Caps (Ranitidine Hcl) .... Two Times A Day 5)  Crestor 10 Mg Tabs (Rosuvastatin Calcium) .... Once Daily 6)  Klor-Con 20 Meq Pack (Potassium Chloride) .... Once Daily 7)  Furosemide 20 Mg Tabs (Furosemide) .Marland Kitchen.. 1 Tablet By Mouth Once Daily 8)  Coumadin  Tabs (Warfarin Sodium Tabs) .... Dose Per Pharmacy 9)  Levothyroxine Sodium 25 Mcg Tabs (Levothyroxine Sodium) .... Take 1 Tablet By Mouth Once Daily 10)  Potassium Chloride Crys Cr 20 Meq Cr-Tabs (Potassium Chloride Crys Cr) .... Take One Tablet By Mouth Daily  Allergies (verified): No Known Drug Allergies  Past History:  Past Medical History: CVA 1999 Paroxysmal Atrial fibrillation and atrial tachycardia s/p PPM for tachycardia/ bradycardia syndrome Diabetes mellitus, type II Hyperlipidemia Hypertension last microalbumin done in 1/07     Past Surgical History: Hemorrhoidectomy Hysterectomy Permanent pacemaker (1999), updated 2006 (MDT) by Dr Verlon Setting Cholecystectomy  Social History: Pt lives in [place].  Divorced.  Denies TED.  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  Review of Systems       All systems are reviewed and negative except as listed in the HPI.   Vital Signs:  Patient profile:   77 year old female Height:      62 inches Weight:      188 pounds BMI:     34.51 Pulse rate:   66 / minute BP sitting:   102 / 70  (left arm)  Vitals Entered By: Tiffany [name] CMA (January 05, 2010 9:27 AM)  Physical Exam  General:  NAD Head:  normocephalic and atraumatic Eyes:  PERRLA/EOM intact; conjunctiva and lids normal. Mouth:  Teeth, gums and palate normal. Oral mucosa normal. Neck:  supple Chest Wall:  R sided pacemaker pocket is well healed Lungs:  Clear bilaterally to auscultation and percussion. Heart:  Non-displaced PMI, chest non-tender; regular rate and rhythm, S1, S2 without murmurs, rubs or gallops. Carotid upstroke normal, no bruit. Normal abdominal aortic size, no bruits. Femorals normal pulses, no bruits. Pedals normal pulses. No edema, no varicosities. Abdomen:  Bowel sounds positive; abdomen soft and non-tender without masses, organomegaly, or hernias noted. No hepatosplenomegaly. Msk:  Back normal, normal gait. Muscle strength and tone normal. Pulses:  pulses normal in all 4 extremities Extremities:  No clubbing or cyanosis. Neurologic:  Alert and oriented x 3.   PPM Specifications Following MD:  Thompson Grayer, MD     Referring MD:  Geanie Cooley PPM Vendor:  Medtronic     PPM Model Number:  [id number]     PPM Serial Number:  [id number] PPM DOI:  11/09/2004     PPM Implanting MD:  Sinda Du  Lead 1    Location: RA     DOI: 11/03/1997     Model #: 438-01     Serial #: [id number]     Status: active Lead 2    Location: RV     DOI: 11/03/1997     Model #: 438-10      Serial #: [id number]     Status: active  Magnet Response Rate:  BOL 85 ERI 65  Indications:  [name]   PPM Follow Up Battery Voltage:  2.95 V       PPM Device Measurements Atrium  Impedance: 312 ohms, Threshold: 1.0 V at 0.4 msec Right Ventricle  Amplitude: 10.4 mV, Impedance: 264 ohms, Threshold: 1.0 V at 0.4 msec  Parameters Mode:  MVP     Lower Rate Limit:  60     Upper Rate Limit:  130 Paced AV Delay:  180     Sensed AV Delay:  150 Next Remote Date:  04/07/2010     Tech Comments:  [place] [facility] PT---NORMAL DEVICE FUNCTION.  CHANGED RV OUTPUT FROM 3.0 TO 2.5 AND Atrial  SENSITIVITY FROM 0.15 TO 0.4mV.  CHANGED MTR/MSR FROM 130 TO 110 AND PAV INTERVAL FROM 180 TO 200 AND SAV INTERVAL FROM 150 TO 179ms.  TURNED ATRIAL THERAPIES ON PER JA. PT WOULD LIKE TO BE ENROLLED IN CARELINK.  [place] TRANSMISSION 04-07-10. Shelly Bombard  January 05, 2010 9:58 AM MD Comments:  agree atrial sensitivity adjusted to reduce far field sensing on the atrial channel Atrial therapies cut back on to treat Atach/aflutter and upper rate decreased to 110  bpm  Impression & Recommendations:  Problem # 1:  ATRIAL FIBRILLATION (ICD-427.31) The patient has h/o afib and atrial tachycardia which appears to be reasonably well controlled without AAD. Continue cardizem and metoprolol.  Continue coumadin longterm. Pacemaker therapies adjusted as above  Problem # 2:  * Hx of PACEMAKER pacemaker for bradycardia as above  Problem # 3:  HYPERTENSION (ICD-401.9) stable Her updated medication list for this problem includes:    Tiazac 180 Mg Cp24 (Diltiazem hcl er beads) ..... Once daily    Metoprolol Tartrate 25 Mg Tabs (Metoprolol tartrate) .Marland Kitchen... Take one tablet by mouth twice a day    Furosemide 20 Mg Tabs (Furosemide) .Marland Kitchen... 1 tablet by mouth once daily  Patient Instructions: 1)  Your physician wants you to follow-up in: 12 months with Dr Vallery Ridge will receive a reminder letter in the mail two months in advance. If  you don't receive a letter, please call our office to schedule the follow-up appointment. 2)  [facility] Transmission on 04/07/2010

## 2010-04-13 ENCOUNTER — Encounter: Payer: Self-pay | Admitting: Internal Medicine

## 2010-04-13 ENCOUNTER — Encounter (INDEPENDENT_AMBULATORY_CARE_PROVIDER_SITE_OTHER): Payer: Medicare Other

## 2010-04-13 DIAGNOSIS — I498 Other specified cardiac arrhythmias: Secondary | ICD-10-CM

## 2010-04-20 NOTE — Procedures (Signed)
Summary: mdt ppm   Current Medications (verified): 1)  Tiazac 180 Mg Cp24 (Diltiazem Hcl Er Beads) .... Once Daily 2)  Metformin Hcl 1000 Mg Tabs (Metformin Hcl) .... Two Times A Day 3)  Metoprolol Tartrate 25 Mg Tabs (Metoprolol Tartrate) .... Take One Tablet By Mouth Twice A Day 4)  Ranitidine Hcl 150 Mg Caps (Ranitidine Hcl) .... Two Times A Day 5)  Crestor 10 Mg Tabs (Rosuvastatin Calcium) .... Once Daily 6)  Klor-Con 20 Meq Pack (Potassium Chloride) .... Once Daily 7)  Furosemide 20 Mg Tabs (Furosemide) .Marland Kitchen.. 1 Tablet By Mouth Once Daily 8)  Coumadin  Tabs (Warfarin Sodium Tabs) .... Dose Per Pharmacy 9)  Levothyroxine Sodium 25 Mcg Tabs (Levothyroxine Sodium) .... Take 1 Tablet By Mouth Once Daily  Allergies (verified): No Known Drug Allergies  PPM Specifications Following MD:  Thompson Grayer, MD     Referring MD:  Geanie Cooley PPM Vendor:  Medtronic     PPM Model Number:  [id number]     PPM Serial Number:  [id number] PPM DOI:  11/09/2004     PPM Implanting MD:  Sinda Du  Lead 1    Location: RA     DOI: 11/03/1997     Model #: 438-01     Serial #: [id number]     Status: active Lead 2    Location: RV     DOI: 11/03/1997     Model #: [id number]     Serial #: [id number]     Status: active  Magnet Response Rate:  BOL 85 ERI 65  Indications:  [name]   Parameters Mode:  MVP     Lower Rate Limit:  60     Upper Rate Limit:  130 Paced AV Delay:  180     Sensed AV Delay:  150 Tech Comments:  See PaceArt MD Comments:  see scanned report

## 2010-04-26 NOTE — Cardiovascular Report (Signed)
Summary: Office Visit   Office Visit   Imported By: Sallee Provencal 04/22/2010 14:43:20  _____________________________________________________________________  External Attachment:    Type:   Image     Comment:   External Document

## 2010-05-16 ENCOUNTER — Other Ambulatory Visit: Payer: Self-pay | Admitting: Family Medicine

## 2010-05-16 DIAGNOSIS — Z1231 Encounter for screening mammogram for malignant neoplasm of breast: Secondary | ICD-10-CM

## 2010-06-24 ENCOUNTER — Ambulatory Visit
Admission: RE | Admit: 2010-06-24 | Discharge: 2010-06-24 | Disposition: A | Discharge status: Home / Self Care | Payer: Medicare Other | Source: Ambulatory Visit | Attending: Family Medicine | Admitting: Family Medicine

## 2010-06-24 DIAGNOSIS — Z1231 Encounter for screening mammogram for malignant neoplasm of breast: Secondary | ICD-10-CM

## 2010-07-15 NOTE — H&P (Signed)
[place]  Patient:    Tiffany [name]                    MRN: [id number] Adm. Date:  [id number] Attending:  Zigmund Gottron Dictator:   Alethia Berthold, M.D.                         History and Physical  PROBLEM LIST: 1. Bright red blood per rectum. 2. History of sick sinus syndrome status post pacemaker. 3. History of congestive heart failure. 4. History of atrial fibrillation. 5. Status post CVA in 1999.  CHIEF COMPLAINT:  Rectal bleeding.  HISTORY OF PRESENT ILLNESS:  This is a 77 year old white female who had been constipated x 5 days.  She took a natural laxative, over-the-counter, and had three to four large bowel movements today.  She did not have any abdominal pain with these bowel movements, with the last bowel movement she noted a small amount of  bright red blood per rectum.  She was not concerned, within an hour to two hours she had another urge to have a bowel movement.  She went into the restroom. She did not have any stool rather it was a large amount of bright red blood per rectum in the toilet.  She would estimate that it was approximately 1/2 to 1 cup of bleeding.  The bleeding continued even with wiping.  She held some pressure and  came into the emergency department.  She has not had any bleeding since that time. She has had GI history and has been seen by both Dr. Deatra Ina with whom she had a  colonoscopy with polyp removal x 2.  She has also been seen by Dr. Watt Climes.  She ad a bleeding episode 2 to 3 weeks ago and was seen at [facility] where she had workup with what apparently was a barium enema and was noted to have some diverticuli per the patient and was referred back to Dr. Watt Climes on February 16.  PAST MEDICAL HISTORY:  As above.  Also status post cholelithiasis.  Status post  hysterectomy.  Status post CVA in 1999.  CURRENT MEDICATIONS: 1. Lasix 20 mg q.d. 2. K-Dur 10 mg q.d. 3. Coumadin 2.5 mg  q.d. 4. Diltiazem 180 mg capsule q.d.  SOCIAL HISTORY:  No tobacco.  No alcohol.  She does live by herself at a senior  citizen apartment.  FAMILY HISTORY:  Three brothers with diabetes and two sisters with diabetes. Father with diabetes and coronary artery disease.  REVIEW OF SYSTEMS:  Negative for fever or chills.  No urinary complaints.  No chest pain.  No palpitations.  No abdominal pain.  Positive shortness of breath with exertion.  Patient is able to climb 6-8 stairs.  No diarrhea.  Positive constipation.  Occasional right ankle swelling.  No weakness.  PHYSICAL EXAMINATION:  GENERAL:  She is generally a pleasant female.  She is alert and oriented x 3.  HEENT:  Normocephalic, atraumatic.  Tympanic membranes were pearly white. Pupils equal, round, and reactive to light.  Oropharynx was clear.  Moist mucous membranes.  Upper dentures.  NECK:  Supple, no lymphadenopathy, no thyromegaly.  HEART:  Irregularly irregular, no murmurs, rubs, or gallops.  LUNGS:  She had occasional crackling of the base, otherwise, was clear to auscultation.  GASTROINTESTINAL:  Positive bowel sounds, soft, nontender, no distention, no rebound, no guarding.  EXTREMITIES:  No clubbing.  No cyanosis.  No edema.  Positive varicosities.  NEUROLOGICAL:  Reflexes 2+ and symmetrical.  Cranial nerves II-XII intact.  LABORATORY DATA:  WBC 8.2, hemoglobin was 13.6 and hematocrit 39, platelets 240, 72% were neutrophils.  Sodium 138, potassium 3.7, bicarb 106, chloride 25, BUN nd creatinine are 17 and 0.9, and her sugars were 178.  The rest of her complete metabolic panel was within normal limits.  Her INR was 2.3.  ASSESSMENT AND PLAN:  #1 - BRIGHT RED BLOOD PER RECTUM.  This is a 77 year old white female with a moderate amount of bright red blood per rectum, hemodynamically currently stable and asymptomatic, not currently bleeding.  We will admit for close observation.  She has had a GI workup  in the past, apparently relatively benign, suspect internal hemorrhoids but we will refer to Dr. Watt Climes for further evaluation.  If she continues to bleed here in the hospital, we will obtain an in-hospital consult. IV fluids at 50 cc/hr at one-half normal saline.  Follow up on BMP.  #2 - CONGESTIVE HEART FAILURE.  We will continue usual doses of Lasix and K-Dur.  #3 - HISTORY OF ATRIAL FIBRILLATION WITH SICK SINUS SYNDROME.  Patient had stated that she had a thyroid problem but was not continued on thyroid medication at discharge.  We will recheck a TSH for further follow up.  #4 - HYPERGLYCEMIA.  Here in the hospital we will check a hemoglobin A1C to rule out diabetes given family history and age. DD:  03/13/99 TD:  03/12/99 Job: [id number]

## 2010-07-15 NOTE — Discharge Summary (Signed)
[place]  Patient:    Tiffany [name]                    MRN: [id number] Adm. Date:  [id number] Disc. Date: 03/13/99 Attending:  Zigmund Gottron Dictator:   Charlcie Cradle, M.D.                           Discharge Summary  DISCHARGE DIAGNOSES: 1. Rectal bleeding. 2. Sick sinus syndrome with pacemaker. 3. Congestive heart failure. 4. History of atrial fibrillation. 5. Status post cerebrovascular accident.  DISCHARGE MEDICATIONS: 1. Lasix 20 mg p.o. q.d. 2. K-Dur 10 mEq p.o. q.d. 3. Cardizem 180 p.o. q.d. 4. Coumadin 2.5 mg p.o. q.d.  BRIEF ADMISSION HISTORY AND PHYSICAL:  This patient is a 77 year old Caucasian female with past medical history as stated above.  She reported that she had several bowel movements on the day of admission.  Originally, the patient did not notice any blood in her stools; however, with the last two bowel movements, she  noted some blood.  On the ______, she noted about 1/2 cup of blood.  The patient reports she has had rectal bleeding x 8 or 9 years with multiple workups for the rectal bleeding.  She also notes she has had polyps removed x 2.  The patient reports her last bleeding was over two to three weeks ago.  She was seen at [facility].  The patient reports that she had some sort of radiographic study done, possibly a colonoscopy.  The patient was not admitted at that time, and she was set up with a followup appointment with Dr. Watt Climes on February 16.  The patients initial exam showed her vital signs to be stable.  Her physical examination was significant for crackles in the bases of her lungs bilaterally,  otherwise clear to auscultation.  Her abdominal exam was soft, nontender, nondistended with good bowel sounds, no masses.  Initial laboratory work was significant for hemoglobin of 15.6, hematocrit 39.  Initial complete metabolic panel was completely normal.  Initial PT,  PTT, and INR showed a PT of 20.1, INR 2.3, PTT 40.  HOSPITAL COURSE:  #1 - LOWER GASTROINTESTINAL BLEEDING:  The patient was admitted for a lower GI bleed which had been extensively worked up in the past.  The patient was currently hemodynamically stable when she was admitted.  She was started on IV fluids. She was typed and crossed for two units of packed red blood cells in the event of hemodynamic instability.  Given the patients extensive GI workup in the past, it was decided the patient did not need to have a GI workup now.  Dr. Hassell Done was called as he was on call for Dr. Watt Climes, and Dr. Hassell Done had nothing to offer the  patient at this time.  The patient had a CBC rechecked in the morning.   Her hemoglobin and hematocrit was stable at 13.1 and 38.1.  The patient reported no  bleeding.  Given the fact that the patient already had a followup appointment scheduled with Dr. Watt Climes on February 16, the patient was deemed stable for discharge.  She will follow up as scheduled with him on February 16.  #2 - CONGESTIVE HEART FAILURE:  The patient was continued on Lasix throughout this admission.  She remained very stable.  No further workup was done.  #3 - PACEMAKER WITH SICK SINUS SYNDROME AND HISTORY OF ATRIAL  FIBRILLATION: The patient was on Coumadin when she came in.  Her Coumadin was appropriately therapeutic at an INR of 2.3.  Given the patients small amount of bleeding and he fact that she was not actively bleeding and the fact that the patient has a pacemaker, her Coumadin was restarted prior to discharge.  The patient also reported that she thought she had a history of being on Synthroid in the past.  A TSH was drawn during this hospitalization which is not back. If the TSH is abnormal, this will need to be followed up as an outpatient.  CONDITION ON DISCHARGE:  Stable.  FOLLOWUP:  She is discharged to be followed by with Dr. Watt Climes on February 16. DD:  03/13/99 TD:   03/13/99 Job: [id number]

## 2010-07-15 NOTE — Discharge Summary (Signed)
[facility]  Patient:    Tiffany [name], Tiffany [name]                 MRN: [id number] Proc. Date: 09/11/00 Adm. Date:  [id number] Disc. Date: [id number] Attending:  Myrtie Cruise                           Discharge Summary  DISCHARGE DIAGNOSES: 1. Internal and external hemorrhoids. 2. Internal and external hemorrhoidectomy.  HISTORY:  Tiffany [name] is a 77 year old Caucasian female referred to me by [facility] for management of symptomatic bleeding hemorrhoids of longstanding duration.  The patient is a mild diabetic, also has a history of chronic atrial fibrillation which she is followed by Dr. Mare Ferrari, who I discussed with, and he said it would be fine just to stop her Coumadin that she has been on in preparation for the surgery.  Because of her age, etc., I thought it would be best to do her as a 24-hour evaluation instead of doing her as a straight out patient.  The patient was taken to the operative suite.  Her hematocrit was not low. Her prothrombin time was about 16 seconds, and she had an internal and external hemorrhoidectomy in all three quadrants with general anesthesia in a lithotomy position.  She had been given some antibiotics immediately preoperatively and has done nicely.  She is now 24 hours after surgery, was a little bit nauseated, but I think can be released and is not having any significant rectal discomfort.  She has voided and has also soaked in a sitz bath and will resume her chronic medications including her Coumadin.  Her glucoses here have been in the 130 range, and she is on Glucophage which she will continue.  CONDITION ON DISCHARGE:  She is discharged in improved condition and will be seen in the office in one week. DD:  09/11/00 TD:  09/11/00 Job: [id number]

## 2010-07-15 NOTE — Cardiovascular Report (Signed)
Tiffany [name], KOPAS          ACCOUNT NO.:  [id number]   MEDICAL RECORD NO.:  [id number]          PATIENT TYPE:  OIB   LOCATION:  2854                         FACILITY:  [facility]   PHYSICIAN:  Kaylyn Lim., M.D.DATE OF BIRTH:  12-16-1931   DATE OF PROCEDURE:  11/09/2004  DATE OF DISCHARGE:                              CARDIAC CATHETERIZATION   PROCEDURE:  Physicist, medical.   INDICATIONS FOR PROCEDURE:  Generator end-of-life.   PROCEDURE DESCRIPTION:  The patient was brought to the cardiac  catheterization laboratory after appropriate informed consent. She is  prepped and draped in sterile fashion. Approximately 20 cc of 1% lidocaine  was used for local anesthesia. A 2-inch incision was made in the right  deltopectoral groove above her implanted generator. Blunt and Bovie  dissection was then performed until we got to the capsule surrounding the  generator. The capsule was then opened with scissors. The generator was  identified, as well as the header, with leads attached. The generator was  removed from the pocket without difficulty. The atrial lead was then  interrogated. The atrial lead is an Intermedics model O6467120, serial number  [id number], date of  implant November 03, 1997. The atrial lead results are as  follows:  P waves measured 1.2 mV, impedance 395 ohms, threshold 0.8 volts  at 0.4 milliseconds, with a current of 2.1 mA. The ventricular lead was then  identified and removed from the generator. The ventricular lead was then  interrogated. The ventricular lead is an Intermedics model O6467120, serial  number [id number], date of implant November 03, 1997. The following results  were obtained from the ventricular lead:  Impedance was 319 ohms, threshold  of 0.6 volts at 0.4 milliseconds, with a current of 2.4 mA. The ventricular  and atrial leads were then attached to an In-Rhythm Medtronic generator,  [id number], serial number [id number]. Generator was then sewn into the  pocket.  Pocket was copiously irrigated with kanamycin solution. Pocket was then  closed in three continuous layers with 2-0, followed by 2-0, followed by 4-0  Vicryl suture. The patient tolerated the procedure well, no apparent  complications.      Kaylyn Lim., M.D.  Electronically Signed     TWK/MEDQ  D:  11/09/2004  T:  11/09/2004  Job:  [id number]   cc:   Peter M. Martinique, M.D.  [address], [place], [place] [address]  Fax: [phone]

## 2010-07-15 NOTE — Op Note (Signed)
[facility]  Patient:    AIRYANNA, DEFRIES                 MRN: [id number] Proc. Date: 09/10/00 Adm. Date:  [id number] Attending:  Myrtie Cruise CC:         [facility]   Operative Report  PREOPERATIVE DIAGNOSES:  Internal/external hemorrhoids.  OPERATION PERFORMED:  Internal/external hemorrhoidectomy.  ANESTHESIA:  General.  SURGEON:  Dr. Rise Patience.  HISTORY OF PRESENT ILLNESS:  Ms. Margaret Pyle is a 77 year old female referred to me by [facility] for management of sympathetic bleeding, prolapse and internal and external hemorrhoids. The patient had previous colonoscopies. The last one was approximately a year ago by Dr. Watt Climes and no abnormalities noted with the exception of significant hemorrhoids. The patient is on Coumadin for chronic atrial fibrillation and with this combination, she has had frequent episodes of bleeding and was referred to [place]. On examination in the office, she has got large internal and external hemorrhoids and with the Coumadin, I thought it was best just to proceed on and perform internal and external hemorrhoidectomy and not try to do any type of rubber banding or local excision in this setting. I therefore stopped the Coumadin, talked with her cardiologist and he said it was fine to be off the Coumadin for a short time. Her prothrombin time today was about 15 seconds and she was taken to the operating room.  DESCRIPTION OF PROCEDURE:  Preoperatively, she was given 3 gm of Unasyn and induction of general anesthesia ______ tube. She has PAS stockings and was placed up in lithotomy position. With this position, she had prolapse ______, general hemorrhoids and we prepped the perianal area with a Betadine surgical solution and draped her in a sterile manner. I anesthetized the sphincter complex with about 20 cc of 0.5 Marcaine with adrenaline and then she had been draped with a sterile field. I removed all three  internal and external hemorrhoids starting at the right anterior quadrant first and the right posterior lateral quadrant second near the left lateral quadrant which was the largest in complex. The pedicles were sutured with 2-0 chromic. Using a Bowie clamp, I under clamped the hemorrhoid, the ______ was divided and then sutured over the clamp removing the clamp, pulling the sutures tied and then the exterior half of the hemorrhoidectomy incision closed with running 2-0 chromic sutures. The bleeding was controlled and was good and at completion, it would still admit an anoscope without problems. I had exposed the sphincter in all three quadrants but the sphincter was not divided. The area of the anus, I then applied some xylocaine ointment within the anoscope and then dressing on the external portion of the ______ with a little stretch bandage. Because of her cardiac pathology, I am going to keep her overnight and she should be able to be released in the morning and will restart Coumadin at that time. DD:  09/10/00 TD:  09/10/00 Job: [id number]

## 2010-07-15 NOTE — Op Note (Signed)
Tiffany [name], Tiffany [name]          ACCOUNT NO.:  [id number]   MEDICAL RECORD NO.:  [id number]          PATIENT TYPE:  AMB   LOCATION:  ENDO                         FACILITY:  [facility]   PHYSICIAN:  Jeryl Columbia, M.D.    DATE OF BIRTH:  May 16, 1931   DATE OF PROCEDURE:  07/20/2004  DATE OF DISCHARGE:                                 OPERATIVE REPORT   PROCEDURE:  Colonoscopy with biopsy.   INDICATIONS FOR PROCEDURE:  Patient with lower abdominal pain and cramps due  for colonic screening.   Consent was signed after risks, benefits, methods, and options were  thoroughly discussed multiple times in the past.   MEDICINES USED:  Demerol 40, Versed 4.   DESCRIPTION OF PROCEDURE:  Rectal inspection was pertinent for small  external hemorrhoids. Digital exam was negative. The video pediatric  adjustable colonoscope was inserted, fairly easily advanced around the colon  to the level of the ileocecal valve. Unfortunately to advance to the cecal  pole required rolling her on her back and then on her right side and then  back on her back with various abdominal pressures. The cecum was identified  by the appendiceal orifice and the ileocecal valve. In fact, the scope was  inserted a short ways into the terminal ileum which was normal. Photo  documentation was obtained. The scope was slowly withdrawn. On insertion,  other than a left sided diverticula, no abnormalities were seen. On slow  withdrawal through the colon, the prep was adequate. There was some liquid  stool that required washing and suctioning. Other than the left sided  diverticula and tortuosity and some difficulty holding air, no other  abnormalities were seen as we slowly withdrew back to the rectum. On  retroflexion, there were some small residual hemorrhoids but a tiny polyp on  retroflexion which was cold biopsied x2.  The scope was straightened and  readvanced a short ways up the left side of the colon, air was suctioned,  scope removed. Anorectal pullthrough confirmed the hemorrhoids, ruled out  any other lesions. The polyp seemed to stop bleeding on withdrawal. The  patient tolerated the procedure well. There was no obvious or immediate  complications.   ENDOSCOPIC DIAGNOSIS:  1.  Internal and external small hemorrhoids.  2.  Left sided diverticula and tortuosity.  3.  Rectal tiny polyp on retroflexion cold biopsied.  4.  Otherwise within normal limits to the terminal ileum.   PLAN:  Await pathology. Happy to see back p.r.n., consider CT scan or a  trial of antispasmodics if her pain continues. Possibly just due to  adhesional disease but would get a one CT to be sure and as above happy to  see back p.r.n. Consider repeat colonic screening pending path.      MEM/MEDQ  D:  07/20/2004  T:  07/20/2004  Job:  [id number]   cc:   [place]

## 2010-08-22 ENCOUNTER — Encounter: Payer: Self-pay | Admitting: Internal Medicine

## 2010-10-19 ENCOUNTER — Other Ambulatory Visit: Payer: Self-pay | Admitting: *Deleted

## 2010-10-19 MED ORDER — METOPROLOL TARTRATE 25 MG PO TABS: 25.0000 mg | ORAL_TABLET | Freq: Two times a day (BID) | ORAL | Status: DC

## 2010-10-19 NOTE — Telephone Encounter (Signed)
escribe medication per fax request  

## 2011-01-04 ENCOUNTER — Ambulatory Visit (INDEPENDENT_AMBULATORY_CARE_PROVIDER_SITE_OTHER): Payer: Medicare Other | Admitting: Internal Medicine

## 2011-01-04 ENCOUNTER — Encounter: Payer: Self-pay | Admitting: Internal Medicine

## 2011-01-04 DIAGNOSIS — I428 Other cardiomyopathies: Secondary | ICD-10-CM

## 2011-01-04 DIAGNOSIS — I498 Other specified cardiac arrhythmias: Secondary | ICD-10-CM

## 2011-01-04 DIAGNOSIS — I4719 Other supraventricular tachycardia: Secondary | ICD-10-CM

## 2011-01-04 DIAGNOSIS — I495 Sick sinus syndrome: Secondary | ICD-10-CM | POA: Insufficient documentation

## 2011-01-04 DIAGNOSIS — I4891 Unspecified atrial fibrillation: Secondary | ICD-10-CM

## 2011-01-04 DIAGNOSIS — I10 Essential (primary) hypertension: Secondary | ICD-10-CM

## 2011-01-04 DIAGNOSIS — I471 Supraventricular tachycardia: Secondary | ICD-10-CM | POA: Insufficient documentation

## 2011-01-04 LAB — PACEMAKER DEVICE OBSERVATION
AL AMPLITUDE: 0.7 mv
AL IMPEDENCE PM: 300 Ohm
ATRIAL PACING PM: 24.25
BATTERY VOLTAGE: 2.92 V
BRDY-0002RV: 60 {beats}/min
BRDY-0004RV: 110 {beats}/min
RV LEAD AMPLITUDE: 10.8 mv
RV LEAD IMPEDENCE PM: 272 Ohm
RV LEAD THRESHOLD: 1 V
VENTRICULAR PACING PM: 71.49

## 2011-01-04 MED ORDER — DILTIAZEM HCL ER COATED BEADS 240 MG PO CP24: 240.0000 mg | ORAL_CAPSULE | Freq: Every day | ORAL | Status: DC

## 2011-01-04 NOTE — Progress Notes (Signed)
The patient presents today for routine electrophysiology followup.  Since last being seen in our clinic, the patient reports doing very well.   She remains active. Today, she denies symptoms of palpitations, chest pain, shortness of breath, orthopnea, PND, lower extremity edema, dizziness, presyncope, syncope, or neurologic sequela.  The patient feels that she is tolerating medications without difficulties and is otherwise without complaint today.   Past Medical History  Diagnosis Date  . Stroke 1999  . Paroxysmal atrial fibrillation     and atrial tachycardia  . Tachycardia-bradycardia syndrome     s/p PPM  . DM2 (diabetes mellitus, type 2)   . Hyperlipidemia   . HTN (hypertension)   . Microalbuminuria     last done in 1/07    Past Surgical History  Procedure Date  . Hemorrhoid surgery   . Hysterectomy (other)   . Permanent pacemaker 1999    updated 2006 (MDT) by Dr Verlon Setting  . Cholecystectomy     Current Outpatient Prescriptions  Medication Sig Dispense Refill  . glipiZIDE (GLUCOTROL) 10 MG tablet Take 10 mg by mouth daily.        Marland Kitchen diltiazem (TIAZAC) 180 MG 24 hr capsule Take 180 mg by mouth daily.        . furosemide (LASIX) 20 MG tablet Take 20 mg by mouth daily.        . Levothyroxine Sodium 25 MCG CAPS Take by mouth daily.        . metFORMIN (GLUCOPHAGE) 1000 MG tablet Take 1,000 mg by mouth 2 (two) times daily.        . metoprolol tartrate (LOPRESSOR) 25 MG tablet Take 1 tablet (25 mg total) by mouth 2 (two) times daily.  60 tablet  5  . potassium chloride (KLOR-CON) 20 MEQ packet Take 20 mEq by mouth daily.        . ranitidine (ZANTAC) 150 MG capsule Take 150 mg by mouth 2 (two) times daily.        . rosuvastatin (CRESTOR) 10 MG tablet Take 10 mg by mouth daily.        Marland Kitchen warfarin (COUMADIN) 1 MG tablet Take 2.5 mg by mouth as directed. DOSE PER PHARMACY        No Known Allergies  History   Social History  . Marital Status: Divorced    Spouse Name: N/A    Number of  Children: N/A  . Years of Education: N/A   Occupational History  . Not on file.   Social History Main Topics  . Smoking status: Never Smoker   . Smokeless tobacco: Never Used  . Alcohol Use: No  . Drug Use: No  . Sexually Active: Not on file   Other Topics Concern  . Not on file   Social History Narrative   Lives in [place]. 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 Denies TED.    Physical Exam: Filed Vitals:   01/04/11 0900  BP: 128/74  Pulse: 108  Height: 5\' 2"  (1.575 m)  Weight: 199 lb (90.266 kg)    GEN- The patient is well appearing, alert and oriented x 3 today.   Head- normocephalic, atraumatic Eyes-  Sclera clear, conjunctiva pink Ears- hearing intact Oropharynx- clear Neck- supple, no JVP Lymph- no cervical lymphadenopathy Lungs- Clear to ausculation bilaterally, normal work of breathing Chest- pacemaker pocket is well healed Heart- tachycardic Regular rate and rhythm, no murmurs, rubs or gallops, PMI not laterally displaced GI- soft, NT, ND, + BS  Extremities- no clubbing, cyanosis, or edema MS- no significant deformity or atrophy Skin- no rash or lesion Psych- euthymic mood, full affect Neuro- strength and sensation are intact  Pacemaker interrogation- reviewed in detail today,  See PACEART report  Assessment and Plan:

## 2011-01-04 NOTE — Assessment & Plan Note (Signed)
Stable Increase dilitiazem as above Continue coumadin long term

## 2011-01-04 NOTE — Assessment & Plan Note (Signed)
Stable No change required today  

## 2011-01-04 NOTE — Patient Instructions (Addendum)
Your physician wants you to follow-up in: 12 months with Dr Vallery Ridge will receive a reminder letter in the mail two months in advance. If you don't receive a letter, please call our office to schedule the follow-up appointment.  Remote monitoring is used to monitor your Pacemaker of ICD from home. This monitoring reduces the number of office visits required to check your device to one time per year. It allows [place] to keep an eye on the functioning of your device to ensure it is working properly. You are scheduled for a device check from home on 04/06/2011. You may send your transmission at any time that day. If you have a wireless device, the transmission will be sent automatically. After your physician reviews your transmission, you will receive a postcard with your next transmission date.   Your physician has recommended you make the following change in your medication:  1)Increase your Diltiazem to 240mg  daily

## 2011-01-04 NOTE — Assessment & Plan Note (Signed)
Normal pacemaker function See Pace Art report Changed to DDIR as above Also decreased atrial sensitivity to 0.45 due to oversensing.  Carelink every 3 months, return in 12 months

## 2011-01-04 NOTE — Assessment & Plan Note (Signed)
Pacemaker interrogation today reveals atrial tachycardia 110 bpm,  Pt is asymptomatic I have reprogrammed to DDIR to reduce V pacing.  We ambulated her in the hall and she appears to be tolerating this. Increase diltiazem to 240mg  daily

## 2011-03-13 ENCOUNTER — Telehealth: Payer: Self-pay | Admitting: Internal Medicine

## 2011-03-13 NOTE — Telephone Encounter (Signed)
Patient will call Medtronic Carelink for instructions and help with set up of transmitter.

## 2011-03-13 NOTE — Telephone Encounter (Deleted)
Pt calling re doing home transmission and cant figure it out, pls call

## 2011-04-04 ENCOUNTER — Telehealth: Payer: Self-pay | Admitting: Internal Medicine

## 2011-04-06 ENCOUNTER — Encounter: Payer: Medicare Other | Admitting: *Deleted

## 2011-04-07 NOTE — Telephone Encounter (Signed)
FU Call: Pt calling wanting to speak with Claiborne Billings regarding pt pacemaker machine. Pt said the first light never goes to the bulls eye. Please return pt call to discuss further.

## 2011-04-07 NOTE — Telephone Encounter (Signed)
Patient unable to send transmission remotely.  She is scheduled for an in office check 2/13 with the device clinic.

## 2011-04-12 ENCOUNTER — Ambulatory Visit (INDEPENDENT_AMBULATORY_CARE_PROVIDER_SITE_OTHER): Payer: Medicare Other | Admitting: *Deleted

## 2011-04-12 ENCOUNTER — Encounter: Payer: Self-pay | Admitting: Internal Medicine

## 2011-04-12 DIAGNOSIS — I428 Other cardiomyopathies: Secondary | ICD-10-CM

## 2011-04-12 DIAGNOSIS — I498 Other specified cardiac arrhythmias: Secondary | ICD-10-CM

## 2011-04-12 DIAGNOSIS — I471 Supraventricular tachycardia: Secondary | ICD-10-CM

## 2011-04-12 LAB — PACEMAKER DEVICE OBSERVATION
AL AMPLITUDE: 0.704 mv
AL IMPEDENCE PM: 288 Ohm
AL THRESHOLD: 0.5 V
ATRIAL PACING PM: 38.05
BATTERY VOLTAGE: 2.9 V
RV LEAD AMPLITUDE: 10.0992 mv
RV LEAD IMPEDENCE PM: 252 Ohm
RV LEAD THRESHOLD: 0.5 V
VENTRICULAR PACING PM: 96.73

## 2011-04-24 ENCOUNTER — Other Ambulatory Visit: Payer: Self-pay | Admitting: *Deleted

## 2011-04-24 MED ORDER — METOPROLOL TARTRATE 25 MG PO TABS: 25.0000 mg | ORAL_TABLET | Freq: Every day | ORAL | Status: DC

## 2011-04-25 ENCOUNTER — Other Ambulatory Visit: Payer: Self-pay | Admitting: *Deleted

## 2011-04-25 MED ORDER — METOPROLOL TARTRATE 25 MG PO TABS: 25.0000 mg | ORAL_TABLET | Freq: Two times a day (BID) | ORAL | Status: DC

## 2011-05-04 LAB — CBC AND DIFFERENTIAL
HCT: 39 % (ref 36–46)
Hemoglobin: 12.1 g/dL (ref 12.0–16.0)
Platelets: 213 10*3/uL (ref 150–399)

## 2011-05-04 LAB — LIPID PANEL
Cholesterol: 101 mg/dL (ref 0–200)
HDL: 45 mg/dL (ref 35–70)
LDL Cholesterol: 32 mg/dL
Triglycerides: 118 mg/dL (ref 40–160)

## 2011-05-04 LAB — BASIC METABOLIC PANEL: Glucose: 163 mg/dL

## 2011-05-04 LAB — TSH: TSH: 2.55 u[IU]/mL (ref <=5.90)

## 2011-05-16 ENCOUNTER — Other Ambulatory Visit: Payer: Self-pay | Admitting: Family Medicine

## 2011-05-16 DIAGNOSIS — Z1231 Encounter for screening mammogram for malignant neoplasm of breast: Secondary | ICD-10-CM

## 2011-06-26 ENCOUNTER — Ambulatory Visit: Payer: Medicare Other

## 2011-06-27 ENCOUNTER — Ambulatory Visit: Payer: Medicare Other

## 2011-06-27 ENCOUNTER — Ambulatory Visit (INDEPENDENT_AMBULATORY_CARE_PROVIDER_SITE_OTHER): Payer: Medicare Other | Admitting: Family Medicine

## 2011-06-27 VITALS — BP 135/67 | HR 83 | Temp 97.8°F | Resp 18 | Ht 62.0 in | Wt 200.4 lb

## 2011-06-27 DIAGNOSIS — R062 Wheezing: Secondary | ICD-10-CM

## 2011-06-27 DIAGNOSIS — R0989 Other specified symptoms and signs involving the circulatory and respiratory systems: Secondary | ICD-10-CM

## 2011-06-27 DIAGNOSIS — R0609 Other forms of dyspnea: Secondary | ICD-10-CM

## 2011-06-27 DIAGNOSIS — R058 Other specified cough: Secondary | ICD-10-CM

## 2011-06-27 DIAGNOSIS — J40 Bronchitis, not specified as acute or chronic: Secondary | ICD-10-CM

## 2011-06-27 DIAGNOSIS — R05 Cough: Secondary | ICD-10-CM

## 2011-06-27 MED ORDER — PREDNISONE 20 MG PO TABS: 40.0000 mg | ORAL_TABLET | Freq: Every day | ORAL | Status: AC

## 2011-06-27 MED ORDER — ALBUTEROL SULFATE (2.5 MG/3ML) 0.083% IN NEBU
2.5000 mg | INHALATION_SOLUTION | Freq: Once | RESPIRATORY_TRACT | Status: AC
  Administered 2011-06-27: 2.5 mg via RESPIRATORY_TRACT

## 2011-06-27 MED ORDER — MOMETASONE FURO-FORMOTEROL FUM 200-5 MCG/ACT IN AERO: 2.0000 | INHALATION_SPRAY | Freq: Two times a day (BID) | RESPIRATORY_TRACT | Status: DC

## 2011-06-27 NOTE — Patient Instructions (Signed)
Reactive Airway Disease, Child Reactive airway disease (RAD) is a condition where your lungs have overreacted to something and caused you to wheeze. As many as 15% of children will experience wheezing in the first year of life and as many as 25% may report a wheezing illness before their 5th birthday.  Many people believe that wheezing problems in a child means the child has the disease asthma. This is not always true. Because not all wheezing is asthma, the term reactive airway disease is often used until a diagnosis is made. A diagnosis of asthma is based on a number of different factors and made by your doctor. The more you know about this illness the better you will be prepared to handle it. Reactive airway disease cannot be cured, but it can usually be prevented and controlled. CAUSES  For reasons not completely known, a trigger causes your child's airways to become overactive, narrowed, and inflamed.  Some common triggers include:  Allergens (things that cause allergic reactions or allergies).   Infection (usually viral) commonly triggers attacks. Antibiotics are not helpful for viral infections and usually do not help with attacks.   Certain pets.   Pollens, trees, and grasses.   Certain foods.   Molds and dust.   Strong odors.   Exercise can trigger an attack.   Irritants (for example, pollution, cigarette smoke, strong odors, aerosol sprays, paint fumes) may trigger an attack. SMOKING CANNOT BE ALLOWED IN HOMES OF CHILDREN WITH REACTIVE AIRWAY DISEASE.   Weather changes - There does not seem to be one ideal climate for children with RAD. Trying to find one may be disappointing. Moving often does not help. In general:   Winds increase molds and pollens in the air.   Rain refreshes the air by washing irritants out.   Cold air may cause irritation.   Stress and emotional upset - Emotional problems do not cause reactive airway disease, but they can trigger an attack. Anxiety,  frustration, and anger may produce attacks. These emotions may also be produced by attacks, because difficulty breathing naturally causes anxiety.  Other Causes Of Wheezing In Children While uncommon, your doctor will consider other cause of wheezing such as:  Breathing in (inhaling) a foreign object.   Structural abnormalities in the lungs.   Prematurity.   Vocal chord dysfunction.   Cardiovascular causes.   Inhaling stomach acid into the lung from gastroesophageal reflux or GERD.   Cystic Fibrosis.  Any child with frequent coughing or breathing problems should be evaluated. This condition may also be made worse by exercise and crying. SYMPTOMS  During a RAD episode, muscles in the lung tighten (bronchospasm) and the airways become swollen (edema) and inflamed. As a result the airways narrow and produce symptoms including:  Wheezing is the most characteristic problem in this illness.   Frequent coughing (with or without exercise or crying) and recurrent respiratory infections are all early warning signs.   Chest tightness.   Shortness of breath.  While older children may be able to tell you they are having breathing difficulties, symptoms in young children may be harder to know about. Young children may have feeding difficulties or irritability. Reactive airway disease may go for long periods of time without being detected. Because your child may only have symptoms when exposed to certain triggers, it can also be difficult to detect. This is especially true if your caregiver cannot detect wheezing with their stethoscope.  Early Signs of Another RAD Episode The earlier you can stop  an episode the better, but everyone is different. Look for the following signs of an RAD episode and then follow your caregiver's instructions. Your child may or may not wheeze. Be on the lookout for the following symptoms:  Your child's skin "sucking in" between the ribs (retractions) when your child  breathes in.   Irritability.   Poor feeding.   Nausea.   Tightness in the chest.   Dry coughing and non-stop coughing.   Sweating.   Fatigue and getting tired more easily than usual.  DIAGNOSIS  After your caregiver takes a history and performs a physical exam, they may perform other tests to try to determine what caused your child's RAD. Tests may include:  A chest x-ray.   Tests on the lungs.   Lab tests.   Allergy testing.  If your caregiver is concerned about one of the uncommon causes of wheezing mentioned above, they will likely perform tests for those specific problems. Your caregiver also may ask for an evaluation by a specialist.  [place]   Notice the warning signs (see Early Sings of Another RAD Episode).   Remove your child from the trigger if you can identify it.   Medications taken before exercise allow most children to participate in sports. Swimming is the sport least likely to trigger an attack.   Remain calm during an attack. Reassure the child with a gentle, soothing voice that they will be able to breathe. Try to get them to relax and breathe slowly. When you react this way the child may soon learn to associate your gentle voice with getting better.   Medications can be given at this time as directed by your doctor. If breathing problems seem to be getting worse and are unresponsive to treatment seek immediate medical care. Further care is necessary.   Family members should learn how to give adrenaline (EpiPen) or use an anaphylaxis kit if your child has had severe attacks. Your caregiver can help you with this. This is especially important if you do not have readily accessible medical care.   Schedule a follow up appointment as directed by your caregiver. Ask your child's care giver about how to use your child's medications to avoid or stop attacks before they become severe.   Call your local emergency medical service (911 in the [place]  immediately if adrenaline has been given at home. Do this even if your child appears to be a lot better after the shot is given. A later, delayed reaction may develop which can be even more severe.  SEEK MEDICAL CARE IF:   There is wheezing or shortness of breath even if medications are given to prevent attacks.   An oral temperature above 102 F (38.9 C) develops.   There are muscle aches, chest pain, or thickening of sputum.   The sputum changes from clear or white to yellow, green, gray, or bloody.   There are problems that may be related to the medicine you are giving. For example, a rash, itching, swelling, or trouble breathing.  SEEK IMMEDIATE MEDICAL CARE IF:   The usual medicines do not stop your child's wheezing, or there is increased coughing.   Your child has increased difficulty breathing.   Retractions are present. Retractions are when the child's ribs appear to stick out while breathing.   Your child is not acting normally, passes out, or has color changes such as blue lips.   There are breathing difficulties with an inability to speak or cry  or grunts with each breath.  Document Released: 02/13/2005 Document Revised: 02/02/2011 Document Reviewed: 11/03/2008 [facility] Patient Information 2012 [place].

## 2011-06-27 NOTE — Progress Notes (Signed)
78 yo woman with severe cough associated with right anterior rib pain.  No fever.  Patient denies chest pain or h/o asthma other than the rib pain. Has been taking Claritin without much benefit Cough is productive:  Yellow Mild DOE When lying down, she hears lots of noise from noise.  Couldn't sleep well last night Goes to [facility] but they referred her here  O:NAD HEENT:  Swollen nasal passages, otherwise unremarkable Chest:  Diffuse ronchi and wheezes Heart:  Regular with II/VI systolic murmur or gallop Neck:  No JVD or adenopathy Extrem:  No edema or calf pain  [facility] reading (PRIMARY) by  Dr. Joseph Art:  CXR - pacemaker, heavy markings, perbronchial cuffing, cardiomegaly Patient given albuterol treatment: some improvement in wheezing afterwards  A:  . 2 bronchitis, no respiratory compromise  Plan: Prednisone and the lateral, recheck 72 hours

## 2011-06-30 ENCOUNTER — Ambulatory Visit (INDEPENDENT_AMBULATORY_CARE_PROVIDER_SITE_OTHER): Payer: Medicare Other | Admitting: Family Medicine

## 2011-06-30 VITALS — BP 143/83 | HR 106 | Temp 98.3°F | Resp 18 | Ht 62.5 in | Wt 200.2 lb

## 2011-06-30 DIAGNOSIS — R059 Cough, unspecified: Secondary | ICD-10-CM

## 2011-06-30 DIAGNOSIS — R05 Cough: Secondary | ICD-10-CM

## 2011-06-30 DIAGNOSIS — R062 Wheezing: Secondary | ICD-10-CM

## 2011-06-30 DIAGNOSIS — J40 Bronchitis, not specified as acute or chronic: Secondary | ICD-10-CM

## 2011-06-30 NOTE — Patient Instructions (Signed)
Only take Prednisone once daily for the next few days.  Continue the inhaler

## 2011-06-30 NOTE — Progress Notes (Signed)
78 year old woman with severe bronchitis comes back for recheck today. She says she feels much stronger her breathing is much better. She has no shortness of breath or chest pain. She also denies fever. Cough is minimally productive at this point.  Objective: Patient appears comfortable at rest with no respiratory discs comfort  HEENT: Unremarkable  Chest: Diffuse rhonchi and wheezes  Patient given nebulizer treatment  Extremities: No edema   A:  Increased sugar secondary to prednisone.  The bronchitis is improved, but not resolved.  P:  Prednisone 20 daily, continue inhaler Recheck Monday

## 2011-07-04 ENCOUNTER — Telehealth: Payer: Self-pay

## 2011-07-04 NOTE — Telephone Encounter (Signed)
.  [facility] Helene Kelp from [facility] called to request records from 06/27/11 & 06/30/11 for Ms. Wichert.  Patient has upcoming appt with [facility].  Fax number for records is [phone] Attn: Helene Kelp. Helene Kelp may be reached by phone at [phone] ask for [facility].

## 2011-07-04 NOTE — Telephone Encounter (Signed)
Faxed a noted stating we need a signed release to send patient records.

## 2011-07-27 ENCOUNTER — Ambulatory Visit
Admission: RE | Admit: 2011-07-27 | Discharge: 2011-07-27 | Disposition: A | Discharge status: Home / Self Care | Payer: Medicare Other | Source: Ambulatory Visit | Attending: Family Medicine | Admitting: Family Medicine

## 2011-07-27 DIAGNOSIS — Z1231 Encounter for screening mammogram for malignant neoplasm of breast: Secondary | ICD-10-CM

## 2011-08-24 ENCOUNTER — Other Ambulatory Visit: Payer: Self-pay

## 2011-08-24 MED ORDER — METOPROLOL TARTRATE 25 MG PO TABS: 25.0000 mg | ORAL_TABLET | Freq: Two times a day (BID) | ORAL | Status: DC

## 2011-09-20 ENCOUNTER — Ambulatory Visit: Payer: Medicare Other | Admitting: Internal Medicine

## 2011-09-20 ENCOUNTER — Ambulatory Visit (INDEPENDENT_AMBULATORY_CARE_PROVIDER_SITE_OTHER): Payer: Medicare Other | Admitting: Emergency Medicine

## 2011-09-20 VITALS — BP 121/60 | HR 63 | Temp 98.4°F | Resp 16 | Ht 63.0 in | Wt 199.0 lb

## 2011-09-20 DIAGNOSIS — R350 Frequency of micturition: Secondary | ICD-10-CM

## 2011-09-20 DIAGNOSIS — N390 Urinary tract infection, site not specified: Secondary | ICD-10-CM

## 2011-09-20 LAB — POCT UA - MICROSCOPIC ONLY
Casts, Ur, LPF, POC: NEGATIVE
Crystals, Ur, HPF, POC: NEGATIVE
Yeast, UA: NEGATIVE

## 2011-09-20 LAB — POCT URINALYSIS DIPSTICK
Glucose, UA: NEGATIVE
Nitrite, UA: NEGATIVE
Spec Grav, UA: >=1.03
Urobilinogen, UA: 0.2
pH, UA: 5.5

## 2011-09-20 MED ORDER — CEPHALEXIN 500 MG PO TABS: 500.0000 mg | ORAL_TABLET | Freq: Three times a day (TID) | ORAL | Status: AC

## 2011-09-20 NOTE — Progress Notes (Signed)
  Subjective:    Patient ID: Tiffany [name], female    DOB: [birth year], 78 y.o.   MRN: [id number]  HPI patient enters this morning with burning on urination discomfort.    Review of Systems patient has a history of diabetes and is currently on Coumadin for atrial fibrillation. Her other medical problems are stable at present     Objective:   Physical Exam  Results for orders placed in visit on 09/20/11  POCT URINALYSIS DIPSTICK      Component Value Range   Color, UA yellow     Clarity, UA cloudy     Glucose, UA neg     Bilirubin, UA small     Ketones, UA trace     Spec Grav, UA >=1.030     Blood, UA large     pH, UA 5.5     Protein, UA 3+     Urobilinogen, UA 0.2     Nitrite, UA neg     Leukocytes, UA large (3+)    POCT UA - MICROSCOPIC ONLY      Component Value Range   WBC, Ur, HPF, POC TNTC     RBC, urine, microscopic 25-30     Bacteria, U Microscopic 1+     Mucus, UA small     Epithelial cells, urine per micros 2-4     Crystals, Ur, HPF, POC neg     Casts, Ur, LPF, POC neg     Yeast, UA neg          Assessment & Plan:  Urine culture was done she is to be on cephalexin 503 times a day. She is to notify the Coumadin clinic she is on antibiotics. She is to drink as much water as possible. Patient is a diabetic

## 2011-09-23 LAB — URINE CULTURE: Colony Count: >=100000

## 2011-10-21 ENCOUNTER — Ambulatory Visit (INDEPENDENT_AMBULATORY_CARE_PROVIDER_SITE_OTHER): Payer: Medicare Other | Admitting: Family Medicine

## 2011-10-21 VITALS — BP 147/70 | HR 71 | Temp 98.0°F | Resp 16 | Ht 63.0 in | Wt 202.0 lb

## 2011-10-21 DIAGNOSIS — I4891 Unspecified atrial fibrillation: Secondary | ICD-10-CM

## 2011-10-21 DIAGNOSIS — I10 Essential (primary) hypertension: Secondary | ICD-10-CM

## 2011-10-21 DIAGNOSIS — Z7901 Long term (current) use of anticoagulants: Secondary | ICD-10-CM

## 2011-10-21 DIAGNOSIS — E785 Hyperlipidemia, unspecified: Secondary | ICD-10-CM

## 2011-10-21 DIAGNOSIS — E119 Type 2 diabetes mellitus without complications: Secondary | ICD-10-CM

## 2011-10-21 DIAGNOSIS — I495 Sick sinus syndrome: Secondary | ICD-10-CM

## 2011-10-21 DIAGNOSIS — I6789 Other cerebrovascular disease: Secondary | ICD-10-CM

## 2011-10-21 DIAGNOSIS — I428 Other cardiomyopathies: Secondary | ICD-10-CM

## 2011-10-21 LAB — COMPREHENSIVE METABOLIC PANEL
ALT: 27 U/L (ref 0–35)
AST: 21 U/L (ref 0–37)
Albumin: 4.1 g/dL (ref 3.5–5.2)
Alkaline Phosphatase: 58 U/L (ref 39–117)
BUN: 17 mg/dL (ref 6–23)
CO2: 23 mEq/L (ref 19–32)
Calcium: 9.4 mg/dL (ref 8.4–10.5)
Chloride: 107 mEq/L (ref 96–112)
Creat: 0.93 mg/dL (ref 0.50–1.10)
Glucose, Bld: 236 mg/dL — ABNORMAL HIGH (ref 70–99)
Potassium: 4.6 mEq/L (ref 3.5–5.3)
Sodium: 140 mEq/L (ref 135–145)
Total Bilirubin: 0.5 mg/dL (ref 0.3–1.2)
Total Protein: 7 g/dL (ref 6.0–8.3)

## 2011-10-21 LAB — POCT GLYCOSYLATED HEMOGLOBIN (HGB A1C): Hemoglobin A1C: 7.5

## 2011-10-21 LAB — PROTIME-INR
INR: 1.63 — ABNORMAL HIGH (ref <1.50)
Prothrombin Time: 19.9 seconds — ABNORMAL HIGH (ref 11.6–15.2)

## 2011-10-21 NOTE — Progress Notes (Signed)
Subjective: 78 year old lady who has been here to this practice a number of times, but went to [facility] as her primary care practice. They have closed, so she has come over here for management. She sees a cardiologist at [facility] heart care but has not had any other regular specialist she states. She has a list of problems. About 14 years ago she had a stroke from bradycardia and atrial fibrillation. She had a sick sinus syndrome, and got a pacemaker. She has remained on a regular therapy, and has continued to do well. She lives independently. She has very little endurance, just going out to the mailbox and back to interpret exhausted and has to sit down. She sleeps with some elevation of the head of the bed.  She does her own housework, gets places, and is very self-sufficient. She has one daugheter in the region.  She came here today for regulation of her Coumadin therapy again. She has no other major acute complaints.  Objective: Pleasant elderly lady, alert and oriented, in no acute distress at this time. Throat was clear. Neck supple without nodes or thyromegaly. Chest is clear to auscultation. Has a pacemaker above her right breast. Heart was regular without murmurs. Abdomen soft without masses. Minimal ankle edema today.  Assessment: Chronic anticoagulant therapy History of sick sinus syndrome and pacemaker History stroke History of cardiomyopathy Hyperlipidemia History of diabetes History of hypertension History of hyperlipidemia   Plan: Check her blood today and undergo the results of that in a few days. I will adjust the Coumadin as this. Her current dose is the 2.5 mg tablets which she is taking one on Sunday, Tuesday, Thursday, and Saturday and one half on Monday Wednesday and Friday.

## 2011-10-21 NOTE — Patient Instructions (Addendum)
Continue current medications. We will let you know what your blood thinner test shows a few days.  Unless informed otherwise she should get this checked about every 3 months. She can come in here and be checked on a walk-in basis.  One of the good practices for care of the elderly is: [place]:  [phone]    At our practice you can call and see if appointments are available with Dr. Ellsworth Lennox at [address] (the building next door to this) [phone].  You have seen Dr. Joseph Art in the past and can also check to see if he is taking any new appointment patients,

## 2011-10-23 ENCOUNTER — Telehealth: Payer: Self-pay | Admitting: Internal Medicine

## 2011-10-23 ENCOUNTER — Telehealth: Payer: Self-pay

## 2011-10-23 NOTE — Telephone Encounter (Signed)
Pt wants dr hopper to know she has an appt with [place] for her coumadin rx on Wednesday.  [facility]: [phone]  bf

## 2011-10-23 NOTE — Telephone Encounter (Signed)
New Problem:    Patient called in wanting to speak with you about having her coumadin draw at her urgent care on Saturday and that she is supposed to call back to find out the results so they could refill her warfarin.  Please call back.

## 2011-10-23 NOTE — Telephone Encounter (Signed)
Pt previously followed at [facility].  Was told by Urgent care to call our office to establish care for INR management.  She is a patient of Dr. Jackalyn Lombard.  Spoke with pt.  Set up appt for 8/28 at 9:30.

## 2011-10-25 ENCOUNTER — Ambulatory Visit (INDEPENDENT_AMBULATORY_CARE_PROVIDER_SITE_OTHER): Payer: Medicare Other | Admitting: *Deleted

## 2011-10-25 DIAGNOSIS — I6789 Other cerebrovascular disease: Secondary | ICD-10-CM

## 2011-10-25 DIAGNOSIS — I4891 Unspecified atrial fibrillation: Secondary | ICD-10-CM

## 2011-10-25 DIAGNOSIS — Z7901 Long term (current) use of anticoagulants: Secondary | ICD-10-CM

## 2011-10-25 LAB — POCT INR: INR: 1.6

## 2011-10-25 MED ORDER — WARFARIN SODIUM 2.5 MG PO TABS: 2.5000 mg | ORAL_TABLET | ORAL | Status: DC

## 2011-10-25 NOTE — Telephone Encounter (Signed)
Noted the coumadin clinic note

## 2011-11-01 ENCOUNTER — Other Ambulatory Visit: Payer: Self-pay | Admitting: Family Medicine

## 2011-11-01 MED ORDER — ROSUVASTATIN CALCIUM 10 MG PO TABS: 10.0000 mg | ORAL_TABLET | Freq: Every day | ORAL | Status: DC

## 2011-11-01 MED ORDER — LEVOTHYROXINE SODIUM 25 MCG PO CAPS: 25.0000 ug | ORAL_CAPSULE | Freq: Every day | ORAL | Status: DC

## 2011-11-01 MED ORDER — POTASSIUM CHLORIDE 20 MEQ PO PACK: 20.0000 meq | PACK | Freq: Every day | ORAL | Status: DC

## 2011-11-03 ENCOUNTER — Ambulatory Visit (INDEPENDENT_AMBULATORY_CARE_PROVIDER_SITE_OTHER): Payer: Medicare Other

## 2011-11-03 DIAGNOSIS — I4891 Unspecified atrial fibrillation: Secondary | ICD-10-CM

## 2011-11-03 DIAGNOSIS — Z7901 Long term (current) use of anticoagulants: Secondary | ICD-10-CM

## 2011-11-03 DIAGNOSIS — I6789 Other cerebrovascular disease: Secondary | ICD-10-CM

## 2011-11-03 LAB — POCT INR: INR: 2.4

## 2011-11-13 ENCOUNTER — Other Ambulatory Visit: Payer: Self-pay | Admitting: Family Medicine

## 2011-11-17 ENCOUNTER — Ambulatory Visit (INDEPENDENT_AMBULATORY_CARE_PROVIDER_SITE_OTHER): Payer: Medicare Other

## 2011-11-17 DIAGNOSIS — I6789 Other cerebrovascular disease: Secondary | ICD-10-CM

## 2011-11-17 DIAGNOSIS — Z7901 Long term (current) use of anticoagulants: Secondary | ICD-10-CM

## 2011-11-17 DIAGNOSIS — I4891 Unspecified atrial fibrillation: Secondary | ICD-10-CM

## 2011-11-17 LAB — POCT INR: INR: 2.3

## 2011-12-06 ENCOUNTER — Telehealth: Payer: Self-pay | Admitting: Internal Medicine

## 2011-12-06 ENCOUNTER — Telehealth: Payer: Self-pay

## 2011-12-06 NOTE — Telephone Encounter (Signed)
Call:  Ask her if she found a primary care physician.  I do not have office hours, and do not feel I can take her on as a regular patient in our walk-in setting.  Offer to refill one month if needed until she sees a PCP.  I cannot give 90 day.

## 2011-12-06 NOTE — Telephone Encounter (Signed)
Hinton Dyer from [employer] [facility] is calling in regards to patients maintenance medications and having them all filled for a 3 month supply (no controled substances included if any) she would like for them to be called into [facility] if possible. Also after reviewing claims history Hinton Dyer also noticed that pt is not on a medication to protect her kidneys. Best for [facility]# [phone]

## 2011-12-06 NOTE — Telephone Encounter (Signed)
Call:  Ask patient if she has worked on getting an appointment with an office based primary care doctor.  I do not believe I can take on patients like this since I do not take appointments.  Offer her one month of refills if needed.

## 2011-12-06 NOTE — Telephone Encounter (Signed)
[facility] [phone] wanted to inform pt Cardio Dr. Renee Rival maintenance medication can be refilled with a 90 day supply at the local [facility] at no extra charge.

## 2011-12-06 NOTE — Telephone Encounter (Signed)
Dr Linna Darner pt saw you on 10/21/11, but it looks like you were encouraging her to find a PCP. Do you want to RF her meds x 90 days, and do you want to address putting her on a med for kidney protection?

## 2011-12-07 ENCOUNTER — Other Ambulatory Visit: Payer: Self-pay

## 2011-12-07 MED ORDER — POTASSIUM CHLORIDE 20 MEQ PO PACK: 20.0000 meq | PACK | Freq: Every day | ORAL | Status: DC

## 2011-12-07 MED ORDER — LEVOTHYROXINE SODIUM 25 MCG PO CAPS: 25.0000 ug | ORAL_CAPSULE | Freq: Every day | ORAL | Status: DC

## 2011-12-07 MED ORDER — ROSUVASTATIN CALCIUM 10 MG PO TABS: 10.0000 mg | ORAL_TABLET | Freq: Every day | ORAL | Status: DC

## 2011-12-07 MED ORDER — DILTIAZEM HCL ER 180 MG PO CP24: 180.0000 mg | ORAL_CAPSULE | Freq: Every day | ORAL | Status: DC

## 2011-12-07 MED ORDER — FUROSEMIDE 20 MG PO TABS: 20.0000 mg | ORAL_TABLET | Freq: Every day | ORAL | Status: DC

## 2011-12-07 NOTE — Telephone Encounter (Signed)
Called pt and D/W her need to est care w/a PCP. We are not accepting new Medicare pts for PCP, so I gave pt name and # to [facility] who Dr Linna Darner had suggested to her at her [place]. Pt agreed to call them to try to set up appt. I advised pt that Dr Linna Darner has approved a 1 month supply of her medications to give her time to est care w/PCP and I called [facility] to find out what meds were needed. Gave approval for 30-day supply of pt's K+, Levothyroxide, Crestor, Furosemide and Diltiazem per Dr Clayborn Heron [place].

## 2011-12-08 ENCOUNTER — Ambulatory Visit (INDEPENDENT_AMBULATORY_CARE_PROVIDER_SITE_OTHER): Payer: Medicare Other

## 2011-12-08 DIAGNOSIS — I4891 Unspecified atrial fibrillation: Secondary | ICD-10-CM

## 2011-12-08 DIAGNOSIS — Z7901 Long term (current) use of anticoagulants: Secondary | ICD-10-CM

## 2011-12-08 DIAGNOSIS — I6789 Other cerebrovascular disease: Secondary | ICD-10-CM

## 2011-12-08 LAB — POCT INR: INR: 2.8

## 2011-12-12 ENCOUNTER — Other Ambulatory Visit: Payer: Self-pay | Admitting: Radiology

## 2011-12-12 MED ORDER — GLIPIZIDE 10 MG PO TABS: 10.0000 mg | ORAL_TABLET | Freq: Every day | ORAL | Status: DC

## 2011-12-19 ENCOUNTER — Encounter: Payer: Self-pay | Admitting: *Deleted

## 2012-01-04 ENCOUNTER — Encounter: Payer: Self-pay | Admitting: *Deleted

## 2012-01-04 DIAGNOSIS — Z950 Presence of cardiac pacemaker: Secondary | ICD-10-CM | POA: Insufficient documentation

## 2012-01-10 ENCOUNTER — Ambulatory Visit (INDEPENDENT_AMBULATORY_CARE_PROVIDER_SITE_OTHER): Payer: Medicare Other | Admitting: *Deleted

## 2012-01-10 ENCOUNTER — Encounter: Payer: Self-pay | Admitting: Internal Medicine

## 2012-01-10 ENCOUNTER — Ambulatory Visit (INDEPENDENT_AMBULATORY_CARE_PROVIDER_SITE_OTHER): Payer: Medicare Other | Admitting: Internal Medicine

## 2012-01-10 VITALS — BP 128/66 | HR 64 | Ht 63.0 in | Wt 200.0 lb

## 2012-01-10 DIAGNOSIS — I4891 Unspecified atrial fibrillation: Secondary | ICD-10-CM

## 2012-01-10 DIAGNOSIS — T82110A Breakdown (mechanical) of cardiac electrode, initial encounter: Secondary | ICD-10-CM

## 2012-01-10 DIAGNOSIS — I6789 Other cerebrovascular disease: Secondary | ICD-10-CM

## 2012-01-10 DIAGNOSIS — Z950 Presence of cardiac pacemaker: Secondary | ICD-10-CM

## 2012-01-10 DIAGNOSIS — I495 Sick sinus syndrome: Secondary | ICD-10-CM

## 2012-01-10 DIAGNOSIS — T82190A Other mechanical complication of cardiac electrode, initial encounter: Secondary | ICD-10-CM

## 2012-01-10 DIAGNOSIS — I428 Other cardiomyopathies: Secondary | ICD-10-CM

## 2012-01-10 DIAGNOSIS — Z7901 Long term (current) use of anticoagulants: Secondary | ICD-10-CM

## 2012-01-10 LAB — PACEMAKER DEVICE OBSERVATION
AL AMPLITUDE: 0.6 mv
AL IMPEDENCE PM: 296 Ohm
ATRIAL PACING PM: 54.88
BATTERY VOLTAGE: 2.86 V
BRDY-0002RV: 60 {beats}/min
BRDY-0004RV: 110 {beats}/min
RV LEAD IMPEDENCE PM: 280 Ohm
RV LEAD THRESHOLD: 1 V
VENTRICULAR PACING PM: 97.37

## 2012-01-10 LAB — POCT INR: INR: 2.3

## 2012-01-10 NOTE — Patient Instructions (Addendum)
Your physician wants you to follow-up in: 6 months with device clinic and 12 months with Dr Rayann Heman.  You will receive a reminder letter in the mail two months in advance. If you don't receive a letter, please call our office to schedule the follow-up appointment.  A chest x-ray takes a picture of the organs and structures inside the chest, including the heart, lungs, and blood vessels. This test can show several things, including, whether the heart is enlarges; whether fluid is building up in the lungs; and whether pacemaker / defibrillator leads are still in place.

## 2012-01-15 NOTE — Progress Notes (Signed)
The patient presents today for routine electrophysiology followup.  Since last being seen in our clinic, the patient reports doing very well. Today, she denies symptoms of palpitations, chest pain, shortness of breath,lower extremity edema, dizziness,syncope, or neurologic sequela.  The patient feels that she is tolerating medications without difficulties and is otherwise without complaint today.   Past Medical History  Diagnosis Date  . Stroke 1999  . Paroxysmal atrial fibrillation     and atrial tachycardia  . Tachycardia-bradycardia syndrome     s/p PPM  . DM2 (diabetes mellitus, type 2)   . Hyperlipidemia   . HTN (hypertension)   . Microalbuminuria     last done in 1/07   . Hypothyroidism   . Esophageal reflux    Past Surgical History  Procedure Date  . Hemorrhoid surgery   . Hysterectomy (other)   . Permanent pacemaker 1999    updated 2006 (MDT) by Dr Verlon Setting  . Cholecystectomy     Current Outpatient Prescriptions  Medication Sig Dispense Refill  . diltiazem (DILACOR XR) 180 MG 24 hr capsule Take 1 capsule (180 mg total) by mouth daily.  30 capsule  0  . furosemide (LASIX) 20 MG tablet Take 1 tablet (20 mg total) by mouth daily.  30 tablet  0  . glipiZIDE (GLUCOTROL) 10 MG tablet Take 1 tablet (10 mg total) by mouth daily.  30 tablet  2  . Levothyroxine Sodium 25 MCG CAPS Take 1 capsule (25 mcg total) by mouth daily. NEEDS OFFICE VISIT/LABS  30 capsule  0  . metFORMIN (GLUCOPHAGE) 1000 MG tablet TAKE 1 TABLET BY MOUTH TWICE DAILY.  60 tablet  2  . metoprolol tartrate (LOPRESSOR) 25 MG tablet Take 1 tablet (25 mg total) by mouth 2 (two) times daily.  60 tablet  3  . Mometasone Furo-Formoterol Fum 200-5 MCG/ACT AERO Inhale 2 puffs into the lungs 2 (two) times daily.  1 Inhaler  3  . potassium chloride (KLOR-CON) 20 MEQ packet Take 20 mEq by mouth daily. NEEDS OFFICE VISIT/LABS  30 tablet  0  . ranitidine (ZANTAC) 150 MG capsule Take 150 mg by mouth 2 (two) times daily.        .  rosuvastatin (CRESTOR) 10 MG tablet Take 1 tablet (10 mg total) by mouth daily. NEEDS OFFICE VISIT/LABS FOR MORE  30 tablet  0  . warfarin (COUMADIN) 2.5 MG tablet Take 1 tablet (2.5 mg total) by mouth as directed.  30 tablet  3    No Known Allergies  History   Social History  . Marital Status: Divorced    Spouse Name: N/A    Number of Children: N/A  . Years of Education: N/A   Occupational History  . Not on file.   Social History Main Topics  . Smoking status: Never Smoker   . Smokeless tobacco: Never Used  . Alcohol Use: No  . Drug Use: No  . Sexually Active: Not on file   Other Topics Concern  . Not on file   Social History Narrative   Lives in [place]. 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 Denies T[employer].    Physical Exam: Filed Vitals:   01/10/12 1433  BP: 128/66  Pulse: 64  Height: 5\' 3"  (1.6 m)  Weight: 200 lb (90.719 kg)  SpO2: 97%    GEN- The patient is well appearing, alert and oriented x 3 today.   Head- normocephalic, atraumatic Eyes-  Sclera clear, conjunctiva pink Ears- hearing intact  Oropharynx- clear Neck- supple, no JVP Lymph- no cervical lymphadenopathy Lungs- Clear to ausculation bilaterally, normal work of breathing Chest- pacemaker pocket is well healed Heart- tachycardic Regular rate and rhythm, no murmurs, rubs or gallops, PMI not laterally displaced GI- soft, NT, ND, + BS Extremities- no clubbing, cyanosis, or edema  Pacemaker interrogation- reviewed in detail today,  See PACEART report  Assessment and Plan:

## 2012-01-15 NOTE — Assessment & Plan Note (Signed)
Pacemaker function appears normal.  There are episodes of afib.  The device has a "check atrial lead position" alert.  I think that this is due to afib.  I will however order a CXR to make sure that her lead is stable. See Claudia Desanctis Art report No changes today  Return to the device clinic in 6 months

## 2012-01-15 NOTE — Assessment & Plan Note (Signed)
Continue long term anticoagulation with coumadin

## 2012-02-01 ENCOUNTER — Ambulatory Visit (INDEPENDENT_AMBULATORY_CARE_PROVIDER_SITE_OTHER)
Admission: RE | Admit: 2012-02-01 | Discharge: 2012-02-01 | Disposition: A | Discharge status: Home / Self Care | Payer: Medicare Other | Source: Ambulatory Visit | Attending: Internal Medicine | Admitting: Internal Medicine

## 2012-02-01 DIAGNOSIS — Z950 Presence of cardiac pacemaker: Secondary | ICD-10-CM

## 2012-02-01 DIAGNOSIS — T82110A Breakdown (mechanical) of cardiac electrode, initial encounter: Secondary | ICD-10-CM

## 2012-02-01 DIAGNOSIS — T82190A Other mechanical complication of cardiac electrode, initial encounter: Secondary | ICD-10-CM

## 2012-02-07 ENCOUNTER — Ambulatory Visit (INDEPENDENT_AMBULATORY_CARE_PROVIDER_SITE_OTHER): Payer: Medicare Other | Admitting: *Deleted

## 2012-02-07 DIAGNOSIS — Z7901 Long term (current) use of anticoagulants: Secondary | ICD-10-CM

## 2012-02-07 DIAGNOSIS — I4891 Unspecified atrial fibrillation: Secondary | ICD-10-CM

## 2012-02-07 DIAGNOSIS — I6789 Other cerebrovascular disease: Secondary | ICD-10-CM

## 2012-02-07 LAB — POCT INR: INR: 2.4

## 2012-02-07 MED ORDER — WARFARIN SODIUM 2.5 MG PO TABS: ORAL_TABLET | ORAL | Status: DC

## 2012-03-07 ENCOUNTER — Telehealth: Payer: Self-pay | Admitting: Pharmacist

## 2012-03-07 NOTE — Telephone Encounter (Signed)
Message copied by Cammie Mcgee on Thu Mar 07, 2012 10:28 AM ------      Message from: Margretta Sidle      Created: Mon Feb 19, 2012  4:19 PM       Lattie Haw can you help me with this. I do not know what Orbit II AF is and  Need help with this order       Thanks       Elbert Ewings RN       ----- Message -----         From: Thompson Grayer, MD         Sent: 02/18/2012   9:59 PM           To: Isla Pence, RN, Margretta Sidle, RN            Would not use Pradaxa given gerd      I suspect that costs are high with eliquis.            Would start renally adjusted xarelto (based on CrCl).  Offer enrollment in Orbit II AF when you start the novel agent.            Thanks      JA.            ----- Message -----         From: Margretta Sidle, RN         Sent: 02/07/2012  10:16 AM           To: Thompson Grayer, MD            Mrs Corniel expressed in her visit today, that she would like to change to something different from  Warfarin.      Thanks       Elbert Ewings RN

## 2012-03-07 NOTE — Telephone Encounter (Signed)
Spoke to research and they will address and discuss Orbit II AF trial with patient at 03/20/12 coumadin office visit.   I called and spoke with patient to alert her of upcoming discussion, she is still interested in NOAC instead of Coumadin.  Bonnita Nasuti Pharm.D. CPP, BCPS Clinical Pharmacist [phone] 03/07/2012 10:30 AM

## 2012-03-21 ENCOUNTER — Ambulatory Visit (INDEPENDENT_AMBULATORY_CARE_PROVIDER_SITE_OTHER): Payer: Medicare Other | Admitting: *Deleted

## 2012-03-21 DIAGNOSIS — Z7901 Long term (current) use of anticoagulants: Secondary | ICD-10-CM

## 2012-03-21 DIAGNOSIS — I4891 Unspecified atrial fibrillation: Secondary | ICD-10-CM

## 2012-03-21 DIAGNOSIS — I6789 Other cerebrovascular disease: Secondary | ICD-10-CM

## 2012-03-21 LAB — POCT INR: INR: 2.8

## 2012-03-21 NOTE — Progress Notes (Signed)
Nadine Counts, [facility] 03/07/2012 10:31 AM Signed  Spoke to research and they will address and discuss Orbit II AF trial with patient at 03/20/12 coumadin office visit.  I called and spoke with patient to alert her of upcoming discussion, she is still interested in NOAC instead of Coumadin.  Bonnita Nasuti Pharm.D. CPP, BCPS  Clinical Pharmacist  [phone]  03/07/2012 10:30 AM

## 2012-05-07 ENCOUNTER — Ambulatory Visit (INDEPENDENT_AMBULATORY_CARE_PROVIDER_SITE_OTHER): Payer: Medicare Other

## 2012-05-07 DIAGNOSIS — I6789 Other cerebrovascular disease: Secondary | ICD-10-CM

## 2012-05-07 DIAGNOSIS — I4891 Unspecified atrial fibrillation: Secondary | ICD-10-CM

## 2012-05-07 DIAGNOSIS — Z7901 Long term (current) use of anticoagulants: Secondary | ICD-10-CM

## 2012-05-07 LAB — POCT INR: INR: 2.9

## 2012-05-07 NOTE — Progress Notes (Deleted)
Pt was started on Eliquis for *** on {DATE MONTH DAY [id number].    Reviewed patients medication list.  Pt {Is/is not:9024} currently on any combined P-gp and strong CYP3A4 inhibitors/inducers (ketoconazole, traconazole, ritonavir, carbamazepine, phenytoin, rifampin, St. John's wort).  Reviewed labs.  SCr ***, Weight ***, CrCl- ***.  Dose {Desc; appropriate/inappropriate:30686::"appropriate"} based on CrCl.   Hgb and HCT [id number]

## 2012-06-19 ENCOUNTER — Ambulatory Visit (INDEPENDENT_AMBULATORY_CARE_PROVIDER_SITE_OTHER): Payer: Medicare Other | Admitting: *Deleted

## 2012-06-19 DIAGNOSIS — I6789 Other cerebrovascular disease: Secondary | ICD-10-CM

## 2012-06-19 DIAGNOSIS — I4891 Unspecified atrial fibrillation: Secondary | ICD-10-CM

## 2012-06-19 DIAGNOSIS — Z7901 Long term (current) use of anticoagulants: Secondary | ICD-10-CM

## 2012-06-19 LAB — POCT INR: INR: 3.1

## 2012-06-24 ENCOUNTER — Other Ambulatory Visit: Payer: Self-pay

## 2012-06-24 DIAGNOSIS — Z1231 Encounter for screening mammogram for malignant neoplasm of breast: Secondary | ICD-10-CM

## 2012-07-11 ENCOUNTER — Ambulatory Visit (INDEPENDENT_AMBULATORY_CARE_PROVIDER_SITE_OTHER): Payer: Medicare Other

## 2012-07-11 DIAGNOSIS — Z7901 Long term (current) use of anticoagulants: Secondary | ICD-10-CM

## 2012-07-11 DIAGNOSIS — I6789 Other cerebrovascular disease: Secondary | ICD-10-CM

## 2012-07-11 DIAGNOSIS — I4891 Unspecified atrial fibrillation: Secondary | ICD-10-CM

## 2012-07-11 LAB — POCT INR: INR: 2.4

## 2012-07-25 ENCOUNTER — Ambulatory Visit (INDEPENDENT_AMBULATORY_CARE_PROVIDER_SITE_OTHER): Payer: Medicare Other | Admitting: Cardiology

## 2012-07-25 VITALS — BP 132/63 | HR 65 | Wt 201.0 lb

## 2012-07-25 DIAGNOSIS — R0600 Dyspnea, unspecified: Secondary | ICD-10-CM

## 2012-07-25 DIAGNOSIS — I495 Sick sinus syndrome: Secondary | ICD-10-CM

## 2012-07-25 DIAGNOSIS — Z950 Presence of cardiac pacemaker: Secondary | ICD-10-CM

## 2012-07-25 DIAGNOSIS — R0609 Other forms of dyspnea: Secondary | ICD-10-CM

## 2012-07-25 DIAGNOSIS — I4891 Unspecified atrial fibrillation: Secondary | ICD-10-CM

## 2012-07-28 ENCOUNTER — Encounter: Payer: Self-pay | Admitting: Cardiology

## 2012-07-28 NOTE — Progress Notes (Signed)
ELECTROPHYSIOLOGY OFFICE NOTE  Patient ID: Tiffany [name] MRN: [id number], DOB/AGE: 1932-01-03   Date of Visit: 07/28/2012  Primary Physician: York Ram, MD Primary Cardiologist: Rayann Heman, MD Reason for Visit: EP/device follow-up  History of Present Illness  Tiffany [name] is a pleasant 79 year old woman with tachy-brady syndrome s/p PPM and PAF who presents today for routine electrophysiology followup. Since last being seen in our clinic, she reports she has noticed some increased SOB in the last 2 months, both with and without activity/exertion. She denies CP, palpitations, dizziness, near syncope or syncope. She states it has not really limited her activities and she is still able to perform ADLs. She denies LE swelling, orthopnea, PND or recent weight gain. Ms. Socks reports that she is compliant and tolerating medications without difficulty.  Past Medical History Past Medical History  Diagnosis Date  . Stroke 1999  . Paroxysmal atrial fibrillation     and atrial tachycardia  . Tachycardia-bradycardia syndrome     s/p PPM  . DM2 (diabetes mellitus, type 2)   . Hyperlipidemia   . HTN (hypertension)   . Microalbuminuria     last done in 1/07   . Hypothyroidism   . Esophageal reflux     Past Surgical History Past Surgical History  Procedure Laterality Date  . Hemorrhoid surgery    . Hysterectomy (other)    . Permanent pacemaker  1999    updated 2006 (MDT) by Dr Verlon Setting  . Cholecystectomy      Allergies/Intolerances No Known Allergies  Current Home Medications Current Outpatient Prescriptions  Medication Sig Dispense Refill  . diltiazem (DILACOR XR) 180 MG 24 hr capsule Take 1 capsule (180 mg total) by mouth daily.  30 capsule  0  . furosemide (LASIX) 20 MG tablet Take 1 tablet (20 mg total) by mouth daily.  30 tablet  0  . glipiZIDE (GLUCOTROL) 10 MG tablet Take 1 tablet (10 mg total) by mouth daily.  30 tablet  2  . Levothyroxine Sodium 25 MCG  CAPS Take 1 capsule (25 mcg total) by mouth daily. NEEDS OFFICE VISIT/LABS  30 capsule  0  . metFORMIN (GLUCOPHAGE) 1000 MG tablet TAKE 1 TABLET BY MOUTH TWICE DAILY.  60 tablet  2  . Mometasone Furo-Formoterol Fum 200-5 MCG/ACT AERO Inhale 2 puffs into the lungs 2 (two) times daily.  1 Inhaler  3  . potassium chloride (KLOR-CON) 20 MEQ packet Take 20 mEq by mouth daily. NEEDS OFFICE VISIT/LABS  30 tablet  0  . ranitidine (ZANTAC) 150 MG capsule Take 150 mg by mouth 2 (two) times daily.        . rosuvastatin (CRESTOR) 10 MG tablet Take 1 tablet (10 mg total) by mouth daily. NEEDS OFFICE VISIT/LABS FOR MORE  30 tablet  0  . warfarin (COUMADIN) 2.5 MG tablet Take as directed by coumadin clinic  30 tablet  3  . metoprolol tartrate (LOPRESSOR) 25 MG tablet Take 1 tablet (25 mg total) by mouth 2 (two) times daily.  60 tablet  3   No current facility-administered medications for this visit.   Social History Social History  . Marital Status: Divorced   Social History Main Topics  . Smoking status: Never Smoker   . Smokeless tobacco: Never Used  . Alcohol Use: No  . Drug Use: No   Review of Systems General: No chills, fever, night sweats or weight changes Cardiovascular: No chest pain, edema, orthopnea, palpitations, paroxysmal nocturnal dyspnea Dermatological: No rash, lesions or  masses Respiratory: No cough, dyspnea Urologic: No hematuria, dysuria Abdominal: No nausea, vomiting, diarrhea, bright red blood per rectum, melena, or hematemesis Neurologic: No visual changes, weakness, changes in mental status All other systems reviewed and are otherwise negative except as noted above.  Physical Exam Blood pressure 132/63, pulse 65, weight 201 lb (91.173 kg).  General: Well developed, well appearing 79 year old female in no acute distress. HEENT: Normocephalic, atraumatic. EOMs intact. Sclera nonicteric. Oropharynx clear.  Neck: Supple without bruits. No JVD. Lungs: Respirations regular and  unlabored, CTA bilaterally. No wheezes, rales or rhonchi. Heart: RRR. S1, S2 present. No murmurs, rub, S3 or S4. Abdomen: Soft, non-distended.  Extremities: No clubbing, cyanosis or edema. PT/Radials 2+ and equal bilaterally. Psych: Normal affect. Neuro: Alert and oriented X 3. Moves all extremities spontaneously.   Diagnostics Device interrogation today - Battery nearing ERI. Thresholds and impedances consistent with previous measurements. Atrial undersensing noted. Atrial sensitivity reprogrammed accordingly. Also changed mode to DDDR. 378 treated AT/AF episodes, 498 monitored AT/AF episodes, longest 3 hours. 242 of 310 pace terminated (78%). Device programmed at appropriate safety margins. Histogram distribution appropriate for patient activity level. ROV in 1 month for follow-up and to check battery  Assessment and Plan 1. Shortness of breath, both with and without activity/exertion I suspect this may be related to loss of AV synchrony by device interrogation today; therefore, I reprogrammed her PPM mode to DDDR and adjusted her atrial sensitivity. She is now P synchronous V pacing. She will call us on Monday to let us know how she feels. If her SOB persists, I will order an echo to evaluate her heart structure and function, specifically LV function in setting of chronic V pacing. In addition, she has multiple cardiac risk factors and her symptoms may also represent an anginal equivalent. I will also order a nuclear stress test for cardiac risk stratification.  2. Tachy-brady syndrome s/p PPM implant Her AT/AF burden is significantly lower than previous checks which is likely related to undersensing P waves based on my findings today Reprogrammed her mode to DDDR today and adjusted her atrial sensitivity  Battery is nearing ERI, voltage 2.82 Will follow-up with device clinic in 1 month for PPM battery check 3. PAF Stable; in SR today Longest AT/AF episode => 3 hours; by device interrogation  today, 78% of AT/AF episodes have been pace terminated No changes made to AT/AF therapies  Signed, Mabel Roll, PA-C 07/28/2012, 10:01 PM

## 2012-07-29 ENCOUNTER — Ambulatory Visit: Payer: Medicare Other

## 2012-07-29 ENCOUNTER — Telehealth: Payer: Self-pay | Admitting: Internal Medicine

## 2012-07-29 NOTE — Telephone Encounter (Signed)
New problem   Per pt she was told to call you to let you know how she's doing-per pt she's breathing fine right now

## 2012-07-29 NOTE — Telephone Encounter (Signed)
Routed to Brooke/kwm

## 2012-07-30 LAB — PACEMAKER DEVICE OBSERVATION
AL AMPLITUDE: 0.4 mv
AL IMPEDENCE PM: 264 Ohm
AL THRESHOLD: 1.5 V
ATRIAL PACING PM: 67.6
BATTERY VOLTAGE: 2.82 V
RV LEAD IMPEDENCE PM: 244 Ohm
RV LEAD THRESHOLD: 1 V
VENTRICULAR PACING PM: 99.8

## 2012-08-08 ENCOUNTER — Ambulatory Visit (INDEPENDENT_AMBULATORY_CARE_PROVIDER_SITE_OTHER): Payer: Medicare Other | Admitting: *Deleted

## 2012-08-08 DIAGNOSIS — I4891 Unspecified atrial fibrillation: Secondary | ICD-10-CM

## 2012-08-08 DIAGNOSIS — I6789 Other cerebrovascular disease: Secondary | ICD-10-CM

## 2012-08-08 DIAGNOSIS — Z7901 Long term (current) use of anticoagulants: Secondary | ICD-10-CM

## 2012-08-08 LAB — POCT INR: INR: 2.3

## 2012-08-13 ENCOUNTER — Encounter: Payer: Self-pay | Admitting: Internal Medicine

## 2012-08-23 ENCOUNTER — Ambulatory Visit (INDEPENDENT_AMBULATORY_CARE_PROVIDER_SITE_OTHER): Payer: Medicare Other | Admitting: *Deleted

## 2012-08-23 DIAGNOSIS — I495 Sick sinus syndrome: Secondary | ICD-10-CM

## 2012-08-23 LAB — PACEMAKER DEVICE OBSERVATION
AL IMPEDENCE PM: 268 Ohm
ATRIAL PACING PM: 13.9
BATTERY VOLTAGE: 2.82 V
RV LEAD IMPEDENCE PM: 248 Ohm
VENTRICULAR PACING PM: 99.9

## 2012-08-23 NOTE — Progress Notes (Signed)
Pacemaker check for battery. Battery voltage 2.82 V (ERI 2.81). Pt to have battery check at next protime visit.

## 2012-09-11 ENCOUNTER — Encounter: Payer: Self-pay | Admitting: Internal Medicine

## 2012-09-12 ENCOUNTER — Encounter: Payer: Self-pay | Admitting: Internal Medicine

## 2012-09-19 ENCOUNTER — Encounter: Payer: Self-pay | Admitting: Internal Medicine

## 2012-09-19 ENCOUNTER — Ambulatory Visit (INDEPENDENT_AMBULATORY_CARE_PROVIDER_SITE_OTHER): Payer: Medicaid Other

## 2012-09-19 ENCOUNTER — Ambulatory Visit (INDEPENDENT_AMBULATORY_CARE_PROVIDER_SITE_OTHER): Payer: Medicare Other | Admitting: *Deleted

## 2012-09-19 DIAGNOSIS — I4891 Unspecified atrial fibrillation: Secondary | ICD-10-CM

## 2012-09-19 DIAGNOSIS — Z45010 Encounter for checking and testing of cardiac pacemaker pulse generator [battery]: Secondary | ICD-10-CM

## 2012-09-19 DIAGNOSIS — Z45018 Encounter for adjustment and management of other part of cardiac pacemaker: Secondary | ICD-10-CM

## 2012-09-19 DIAGNOSIS — I6789 Other cerebrovascular disease: Secondary | ICD-10-CM

## 2012-09-19 DIAGNOSIS — Z7901 Long term (current) use of anticoagulants: Secondary | ICD-10-CM

## 2012-09-19 LAB — PACEMAKER DEVICE OBSERVATION
AL AMPLITUDE: 0.308 mv
AL IMPEDENCE PM: 272 Ohm
ATRIAL PACING PM: 8.91
BATTERY VOLTAGE: 2.81 V
BRDY-0002RA: 60 {beats}/min
BRDY-0004RA: 110 {beats}/min
RV LEAD IMPEDENCE PM: 248 Ohm
VENTRICULAR PACING PM: 99.77

## 2012-09-19 LAB — POCT INR: INR: 2.6

## 2012-09-19 NOTE — Progress Notes (Signed)
Battery reached ERI on 09/14/12, mode switched to VVI 65. Changed mode back to DDIR 60, full details in PaceArt. ROV w/ Brooke 09/27/12 at 12:00.

## 2012-09-27 ENCOUNTER — Encounter: Payer: Self-pay | Admitting: Cardiology

## 2012-09-27 ENCOUNTER — Encounter: Payer: Self-pay | Admitting: *Deleted

## 2012-09-27 ENCOUNTER — Encounter (HOSPITAL_COMMUNITY): Payer: Self-pay | Admitting: Pharmacy Technician

## 2012-09-27 ENCOUNTER — Ambulatory Visit (INDEPENDENT_AMBULATORY_CARE_PROVIDER_SITE_OTHER): Payer: Medicare Other | Admitting: Cardiology

## 2012-09-27 VITALS — BP 133/73 | HR 61 | Ht 63.0 in | Wt 200.0 lb

## 2012-09-27 DIAGNOSIS — I4891 Unspecified atrial fibrillation: Secondary | ICD-10-CM

## 2012-09-27 DIAGNOSIS — Z950 Presence of cardiac pacemaker: Secondary | ICD-10-CM

## 2012-09-27 DIAGNOSIS — I495 Sick sinus syndrome: Secondary | ICD-10-CM

## 2012-09-27 DIAGNOSIS — Z45010 Encounter for checking and testing of cardiac pacemaker pulse generator [battery]: Secondary | ICD-10-CM

## 2012-09-27 DIAGNOSIS — Z45018 Encounter for adjustment and management of other part of cardiac pacemaker: Secondary | ICD-10-CM

## 2012-09-27 LAB — CBC WITH DIFFERENTIAL/PLATELET
Basophils Absolute: 0 10*3/uL (ref 0.0–0.1)
Basophils Relative: 0.6 % (ref 0.0–3.0)
Eosinophils Absolute: 0.4 10*3/uL (ref 0.0–0.7)
Eosinophils Relative: 5.3 % — ABNORMAL HIGH (ref 0.0–5.0)
HCT: 36.4 % (ref 36.0–46.0)
Hemoglobin: 11.9 g/dL — ABNORMAL LOW (ref 12.0–15.0)
Lymphocytes Relative: 18.6 % (ref 12.0–46.0)
Lymphs Abs: 1.3 10*3/uL (ref 0.7–4.0)
MCHC: 32.6 g/dL (ref 30.0–36.0)
MCV: 82.8 fl (ref 78.0–100.0)
Monocytes Absolute: 0.5 10*3/uL (ref 0.1–1.0)
Monocytes Relative: 6.8 % (ref 3.0–12.0)
Neutro Abs: 4.7 10*3/uL (ref 1.4–7.7)
Neutrophils Relative %: 68.7 % (ref 43.0–77.0)
Platelets: 216 10*3/uL (ref 150.0–400.0)
RBC: 4.4 Mil/uL (ref 3.87–5.11)
RDW: 18.2 % — ABNORMAL HIGH (ref 11.5–14.6)
WBC: 6.8 10*3/uL (ref 4.5–10.5)

## 2012-09-27 LAB — BASIC METABOLIC PANEL
BUN: 17 mg/dL (ref 6–23)
CO2: 24 mEq/L (ref 19–32)
Calcium: 9.6 mg/dL (ref 8.4–10.5)
Chloride: 107 mEq/L (ref 96–112)
Creatinine, Ser: 1 mg/dL (ref 0.4–1.2)
GFR: 58.23 mL/min — ABNORMAL LOW (ref >60.00)
Glucose, Bld: 199 mg/dL — ABNORMAL HIGH (ref 70–99)
Potassium: 4.4 mEq/L (ref 3.5–5.1)
Sodium: 139 mEq/L (ref 135–145)

## 2012-09-27 LAB — PROTIME-INR
INR: 2.7 ratio — ABNORMAL HIGH (ref 0.8–1.0)
Prothrombin Time: 27.9 s — ABNORMAL HIGH (ref 10.2–12.4)

## 2012-09-27 NOTE — Progress Notes (Signed)
ELECTROPHYSIOLOGY OFFICE NOTE  Patient ID: Tiffany [name] MRN: [id number], DOB/AGE: [birth year]   Date of Visit: 09/27/2012  Primary Physician: York Ram, MD Primary Cardiologist: Rayann Heman, MD Reason for Visit: EP/device follow-up  History of Present Illness  Tiffany [name] is a pleasant 79 year old woman with tachy-brady syndrome s/p PPM and PAF who presents today for routine electrophysiology followup. Her PPM battery is at [facility].   Since last being seen in our clinic, she reports she is doing well and has no cardiac complaints. She denies CP, SOB, palpitations, dizziness, near syncope or syncope. She denies LE swelling, orthopnea, PND or recent weight gain. Tiffany [name] reports that she is compliant and tolerating medications without difficulty.  Past Medical History Past Medical History  Diagnosis Date  . Stroke 1999  . Paroxysmal atrial fibrillation     and atrial tachycardia  . Tachycardia-bradycardia syndrome     s/p PPM  . DM2 (diabetes mellitus, type 2)   . Hyperlipidemia   . HTN (hypertension)   . Microalbuminuria     last done in 1/07   . Hypothyroidism   . Esophageal reflux     Past Surgical History Past Surgical History  Procedure Laterality Date  . Hemorrhoid surgery    . Hysterectomy (other)    . Permanent pacemaker  1999    updated 2006 (MDT) by Dr Verlon Setting  . Cholecystectomy      Allergies/Intolerances No Known Allergies Current Home Medications Current Outpatient Prescriptions  Medication Sig Dispense Refill  . diltiazem (DILACOR XR) 180 MG 24 hr capsule Take 1 capsule (180 mg total) by mouth daily.  30 capsule  0  . furosemide (LASIX) 20 MG tablet Take 1 tablet (20 mg total) by mouth daily.  30 tablet  0  . glipiZIDE (GLUCOTROL) 10 MG tablet Take 1 tablet (10 mg total) by mouth daily.  30 tablet  2  . Levothyroxine Sodium 25 MCG CAPS Take 1 capsule (25 mcg total) by mouth daily. NEEDS OFFICE VISIT/LABS  30 capsule  0  . metFORMIN  (GLUCOPHAGE) 1000 MG tablet TAKE 1 TABLET BY MOUTH TWICE DAILY.  60 tablet  2  . metoprolol tartrate (LOPRESSOR) 25 MG tablet Take 1 tablet (25 mg total) by mouth 2 (two) times daily.  60 tablet  3  . Mometasone Furo-Formoterol Fum 200-5 MCG/ACT AERO Inhale 2 puffs into the lungs 2 (two) times daily.  1 Inhaler  3  . potassium chloride (KLOR-CON) 20 MEQ packet Take 20 mEq by mouth daily. NEEDS OFFICE VISIT/LABS  30 tablet  0  . ranitidine (ZANTAC) 150 MG capsule Take 150 mg by mouth 2 (two) times daily.        . rosuvastatin (CRESTOR) 10 MG tablet Take 1 tablet (10 mg total) by mouth daily. NEEDS OFFICE VISIT/LABS FOR MORE  30 tablet  0  . warfarin (COUMADIN) 2.5 MG tablet Take as directed by coumadin clinic  30 tablet  3   No current facility-administered medications for this visit.   Social History History   Social History  . Marital Status: Divorced    Spouse Name: N/A    Number of Children: N/A  . Years of Education: N/A   Occupational History  . Not on file.   Social History Main Topics  . Smoking status: Never Smoker   . Smokeless tobacco: Never Used  . Alcohol Use: No  . Drug Use: No  . Sexually Active: Not on file   Other Topics Concern  .  Not on file   Social History Narrative   Lives in [place]. 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 Denies TED.     Review of Systems General: No chills, fever, night sweats or weight changes Cardiovascular: No chest pain, dyspnea on exertion, edema, orthopnea, palpitations, paroxysmal nocturnal dyspnea Dermatological: No rash, lesions or masses Respiratory: No cough, dyspnea Urologic: No hematuria, dysuria Abdominal: No nausea, vomiting, diarrhea, bright red blood per rectum, melena, or hematemesis Neurologic: No visual changes, weakness, changes in mental status All other systems reviewed and are otherwise negative except as noted above.  Physical Exam Vitals: Blood pressure 133/73, pulse 61,  height 5\' 3"  (1.6 m), weight 200 lb (90.719 kg).  General: Well developed, well appearing 79 y.o. female in no acute distress. HEENT: Normocephalic, atraumatic. EOMs intact. Sclera nonicteric. Oropharynx clear.  Neck: Supple without bruits. No JVD. Lungs: Respirations regular and unlabored, CTA bilaterally. No wheezes, rales or rhonchi. Heart: RRR. S1, S2 present. No murmurs, rub, S3 or S4. Abdomen: Soft, non-tender, non-distended. BS present x 4 quadrants. No hepatosplenomegaly.  Extremities: No clubbing, cyanosis or edema. DP/PT/Radials 2+ and equal bilaterally. Psych: Normal affect. Neuro: Alert and oriented X 3. Moves all extremities spontaneously.   Diagnostics Device interrogation reviewed from 09/19/2012 - battery voltage 2.81; V paced 99.7% of time  Assessment and Plan 1. PPM battery at [facility] 2. Tachy-brady syndrome s/p PPM implant  3. PAF   Tiffany [name] presents for EP follow-up. Her PPM battery is at [facility]. She is PPM dependent. Discussed the need for PPM generator change. Reviewed the procedure involved, including risks and benefits. These risks include but are not limited to bleeding, infection and/or lead dislodgement / malfunction. Tiffany [name] expressed verbal understanding of the procedure including risks and benefits and wishes to proceed. Scheduled with Dr. Rayann Heman on Thursday, 10/03/2012  Signed, Ileene Hutchinson, PA-C 09/27/2012, 1:09 PM

## 2012-09-27 NOTE — Patient Instructions (Addendum)
Your physician has recommended that you have a pacemaker GENERATOR CHANGE. A pacemaker is a small device that is placed under the skin of your chest or abdomen to help control abnormal heart rhythms. This device uses electrical pulses to prompt the heart to beat at a normal rate. Pacemakers are used to treat heart rhythms that are too slow. Wire (leads) are attached to the pacemaker that goes into the chambers of you heart. This is done in the hospital and usually requires and overnight stay. Please see the instruction sheet given to you today for more information.  Your physician has recommended you make the following change in your medication:   [place] GLIPIZIDE THE MORNING OF PROCEDURE  Your physician recommends that you return for lab work in:  CBC/BMET/PT INR TODAY  YOUR PROCEDURE WILL BE AT THE [facility] ENTRANCE A ON AUGUST 7 TH AT 9:30 AM BUT ARRIVE AT 7:30 A.M

## 2012-10-02 MED ORDER — CHLORHEXIDINE GLUCONATE 4 % EX LIQD
60.0000 mL | Freq: Once | CUTANEOUS | Status: DC
  Filled 2012-10-02: qty 60

## 2012-10-02 MED ORDER — SODIUM CHLORIDE 0.9 % IV SOLN
INTRAVENOUS | Status: DC
  Administered 2012-10-03: 1000 mL via INTRAVENOUS

## 2012-10-02 MED ORDER — SODIUM CHLORIDE 0.9 % IR SOLN
80.0000 mg | Status: AC
  Filled 2012-10-02: qty 2

## 2012-10-02 MED ORDER — CEFAZOLIN SODIUM-DEXTROSE 2-3 GM-% IV SOLR
2.0000 g | INTRAVENOUS | Status: AC
  Filled 2012-10-02 (×2): qty 50

## 2012-10-03 ENCOUNTER — Encounter (HOSPITAL_COMMUNITY)
Admission: RE | Disposition: A | Discharge status: Home / Self Care | Payer: Self-pay | Source: Ambulatory Visit | Attending: Internal Medicine

## 2012-10-03 ENCOUNTER — Ambulatory Visit (HOSPITAL_COMMUNITY)
Admission: RE | Admit: 2012-10-03 | Discharge: 2012-10-03 | Disposition: A | Discharge status: Home / Self Care | Payer: Medicare Other | Source: Ambulatory Visit | Attending: Internal Medicine | Admitting: Internal Medicine

## 2012-10-03 DIAGNOSIS — E119 Type 2 diabetes mellitus without complications: Secondary | ICD-10-CM | POA: Insufficient documentation

## 2012-10-03 DIAGNOSIS — I442 Atrioventricular block, complete: Secondary | ICD-10-CM

## 2012-10-03 DIAGNOSIS — E785 Hyperlipidemia, unspecified: Secondary | ICD-10-CM | POA: Insufficient documentation

## 2012-10-03 DIAGNOSIS — I10 Essential (primary) hypertension: Secondary | ICD-10-CM | POA: Insufficient documentation

## 2012-10-03 DIAGNOSIS — I495 Sick sinus syndrome: Secondary | ICD-10-CM | POA: Diagnosis present

## 2012-10-03 DIAGNOSIS — Z950 Presence of cardiac pacemaker: Secondary | ICD-10-CM | POA: Diagnosis present

## 2012-10-03 DIAGNOSIS — Z79899 Other long term (current) drug therapy: Secondary | ICD-10-CM | POA: Insufficient documentation

## 2012-10-03 DIAGNOSIS — E039 Hypothyroidism, unspecified: Secondary | ICD-10-CM | POA: Insufficient documentation

## 2012-10-03 DIAGNOSIS — Z7901 Long term (current) use of anticoagulants: Secondary | ICD-10-CM | POA: Insufficient documentation

## 2012-10-03 DIAGNOSIS — I4891 Unspecified atrial fibrillation: Secondary | ICD-10-CM | POA: Diagnosis present

## 2012-10-03 DIAGNOSIS — Z45018 Encounter for adjustment and management of other part of cardiac pacemaker: Secondary | ICD-10-CM | POA: Insufficient documentation

## 2012-10-03 DIAGNOSIS — Z45010 Encounter for checking and testing of cardiac pacemaker pulse generator [battery]: Secondary | ICD-10-CM

## 2012-10-03 DIAGNOSIS — K219 Gastro-esophageal reflux disease without esophagitis: Secondary | ICD-10-CM | POA: Insufficient documentation

## 2012-10-03 DIAGNOSIS — Z8673 Personal history of transient ischemic attack (TIA), and cerebral infarction without residual deficits: Secondary | ICD-10-CM | POA: Insufficient documentation

## 2012-10-03 HISTORY — PX: PACEMAKER GENERATOR CHANGE: SHX5998

## 2012-10-03 HISTORY — PX: PACEMAKER GENERATOR CHANGE: SHX5481

## 2012-10-03 LAB — PROTIME-INR
INR: 2.49 — ABNORMAL HIGH (ref 0.00–1.49)
Prothrombin Time: 26.1 seconds — ABNORMAL HIGH (ref 11.6–15.2)

## 2012-10-03 LAB — SURGICAL PCR SCREEN
MRSA, PCR: NEGATIVE
Staphylococcus aureus: NEGATIVE

## 2012-10-03 LAB — GLUCOSE, CAPILLARY: Glucose-Capillary: 154 mg/dL — ABNORMAL HIGH (ref 70–99)

## 2012-10-03 SURGERY — PACEMAKER GENERATOR CHANGE
Anesthesia: LOCAL

## 2012-10-03 MED ORDER — SODIUM CHLORIDE 0.9 % IJ SOLN: 3.0000 mL | Freq: Two times a day (BID) | INTRAMUSCULAR | Status: DC

## 2012-10-03 MED ORDER — ONDANSETRON HCL 4 MG/2ML IJ SOLN: 4.0000 mg | Freq: Four times a day (QID) | INTRAMUSCULAR | Status: DC | PRN

## 2012-10-03 MED ORDER — LIDOCAINE HCL (PF) 1 % IJ SOLN
INTRAMUSCULAR | Status: AC
  Filled 2012-10-03: qty 60

## 2012-10-03 MED ORDER — MIDAZOLAM HCL 5 MG/5ML IJ SOLN
INTRAMUSCULAR | Status: AC
  Filled 2012-10-03: qty 5

## 2012-10-03 MED ORDER — SODIUM CHLORIDE 0.9 % IJ SOLN: 3.0000 mL | INTRAMUSCULAR | Status: DC | PRN

## 2012-10-03 MED ORDER — MUPIROCIN 2 % EX OINT
TOPICAL_OINTMENT | Freq: Two times a day (BID) | CUTANEOUS | Status: DC
  Administered 2012-10-03: 08:00:00 via NASAL
  Filled 2012-10-03 (×2): qty 22

## 2012-10-03 MED ORDER — SODIUM CHLORIDE 0.9 % IV SOLN: 250.0000 mL | INTRAVENOUS | Status: DC | PRN

## 2012-10-03 MED ORDER — HEPARIN (PORCINE) IN NACL 2-0.9 UNIT/ML-% IJ SOLN
INTRAMUSCULAR | Status: AC
  Filled 2012-10-03: qty 500

## 2012-10-03 MED ORDER — ACETAMINOPHEN 325 MG PO TABS: 325.0000 mg | ORAL_TABLET | ORAL | Status: DC | PRN

## 2012-10-03 MED ORDER — HYDROCODONE-ACETAMINOPHEN 5-325 MG PO TABS: 1.0000 | ORAL_TABLET | ORAL | Status: DC | PRN

## 2012-10-03 MED ORDER — FENTANYL CITRATE 0.05 MG/ML IJ SOLN
INTRAMUSCULAR | Status: AC
  Filled 2012-10-03: qty 2

## 2012-10-03 NOTE — Interval H&P Note (Signed)
History and Physical Interval Note:  10/03/2012 8:26 AM  [place]  has presented today for surgery, with the diagnosis of End of life  The various methods of treatment have been discussed with the patient and family. After consideration of risks, benefits and other options for treatment, the patient has consented to  Procedure(s): PACEMAKER GENERATOR CHANGE (N/A) as a surgical intervention .  The patient's history has been reviewed, patient examined, no change in status, stable for surgery.  I have reviewed the patient's chart and labs.  Questions were answered to the patient's satisfaction.     Thompson Grayer

## 2012-10-03 NOTE — H&P (View-Only) (Signed)
ELECTROPHYSIOLOGY OFFICE NOTE  Patient ID: Tiffany [name] MRN: [id number], DOB/AGE: 1931-10-31   Date of Visit: 09/27/2012  Primary Physician: Tiffany Ram, MD Primary Cardiologist: Tiffany Heman, MD Reason for Visit: EP/device follow-up  History of Present Illness  Tiffany [name] is a pleasant 79 year old woman with tachy-brady syndrome s/p PPM and PAF who presents today for routine electrophysiology followup. Her PPM battery is at [facility].   Since last being seen in our clinic, she reports she is doing well and has no cardiac complaints. She denies CP, SOB, palpitations, dizziness, near syncope or syncope. She denies LE swelling, orthopnea, PND or recent weight gain. Tiffany [name] reports that she is compliant and tolerating medications without difficulty.  Past Medical History Past Medical History  Diagnosis Date  . Stroke 1999  . Paroxysmal atrial fibrillation     and atrial tachycardia  . Tachycardia-bradycardia syndrome     s/p PPM  . DM2 (diabetes mellitus, type 2)   . Hyperlipidemia   . HTN (hypertension)   . Microalbuminuria     last done in 1/07   . Hypothyroidism   . Esophageal reflux     Past Surgical History Past Surgical History  Procedure Laterality Date  . Hemorrhoid surgery    . Hysterectomy (other)    . Permanent pacemaker  1999    updated 2006 (MDT) by [name]  . Cholecystectomy      Allergies/Intolerances No Known Allergies Current Home Medications Current Outpatient Prescriptions  Medication Sig Dispense Refill  . diltiazem (DILACOR XR) 180 MG 24 hr capsule Take 1 capsule (180 mg total) by mouth daily.  30 capsule  0  . furosemide (LASIX) 20 MG tablet Take 1 tablet (20 mg total) by mouth daily.  30 tablet  0  . glipiZIDE (GLUCOTROL) 10 MG tablet Take 1 tablet (10 mg total) by mouth daily.  30 tablet  2  . Levothyroxine Sodium 25 MCG CAPS Take 1 capsule (25 mcg total) by mouth daily. NEEDS OFFICE VISIT/LABS  30 capsule  0  . metFORMIN  (GLUCOPHAGE) 1000 MG tablet TAKE 1 TABLET BY MOUTH TWICE DAILY.  60 tablet  2  . metoprolol tartrate (LOPRESSOR) 25 MG tablet Take 1 tablet (25 mg total) by mouth 2 (two) times daily.  60 tablet  3  . Mometasone Furo-Formoterol Fum 200-5 MCG/ACT AERO Inhale 2 puffs into the lungs 2 (two) times daily.  1 Inhaler  3  . potassium chloride (KLOR-CON) 20 MEQ packet Take 20 mEq by mouth daily. NEEDS OFFICE VISIT/LABS  30 tablet  0  . ranitidine (ZANTAC) 150 MG capsule Take 150 mg by mouth 2 (two) times daily.        . rosuvastatin (CRESTOR) 10 MG tablet Take 1 tablet (10 mg total) by mouth daily. NEEDS OFFICE VISIT/LABS FOR MORE  30 tablet  0  . warfarin (COUMADIN) 2.5 MG tablet Take as directed by coumadin clinic  30 tablet  3   No current facility-administered medications for this visit.   Social History History   Social History  . Marital Status: Divorced    Spouse Name: N/A    Number of Children: N/A  . Years of Education: N/A   Occupational History  . Not on file.   Social History Main Topics  . Smoking status: Never Smoker   . Smokeless tobacco: Never Used  . Alcohol Use: No  . Drug Use: No  . Sexually Active: Not on file   Other Topics Concern  .  Not on file   Social History Narrative   Lives in [place]. 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 Denies TED.     Review of Systems General: No chills, fever, night sweats or weight changes Cardiovascular: No chest pain, dyspnea on exertion, edema, orthopnea, palpitations, paroxysmal nocturnal dyspnea Dermatological: No rash, lesions or masses Respiratory: No cough, dyspnea Urologic: No hematuria, dysuria Abdominal: No nausea, vomiting, diarrhea, bright red blood per rectum, melena, or hematemesis Neurologic: No visual changes, weakness, changes in mental status All other systems reviewed and are otherwise negative except as noted above.  Physical Exam Vitals: Blood pressure 133/73, pulse 61,  height 5\' 3"  (1.6 m), weight 200 lb (90.719 kg).  General: Well developed, well appearing 79 y.o. female in no acute distress. HEENT: Normocephalic, atraumatic. EOMs intact. Sclera nonicteric. Oropharynx clear.  Neck: Supple without bruits. No JVD. Lungs: Respirations regular and unlabored, CTA bilaterally. No wheezes, rales or rhonchi. Heart: RRR. S1, S2 present. No murmurs, rub, S3 or S4. Abdomen: Soft, non-tender, non-distended. BS present x 4 quadrants. No hepatosplenomegaly.  Extremities: No clubbing, cyanosis or edema. DP/PT/Radials 2+ and equal bilaterally. Psych: Normal affect. Neuro: Alert and oriented X 3. Moves all extremities spontaneously.   Diagnostics Device interrogation reviewed from 09/19/2012 - battery voltage 2.81; V paced 99.7% of time  Assessment and Plan 1. PPM battery at [facility] 2. Tachy-brady syndrome s/p PPM implant  3. PAF   Tiffany [name] presents for EP follow-up. Her PPM battery is at [facility]. She is PPM dependent. Discussed the need for PPM generator change. Reviewed the procedure involved, including risks and benefits. These risks include but are not limited to bleeding, infection and/or lead dislodgement / malfunction. Tiffany [name] expressed verbal understanding of the procedure including risks and benefits and wishes to proceed. Scheduled with Dr. Rayann [name] on Thursday, 10/03/2012  Signed, Tiffany Hutchinson, PA-C 09/27/2012, 1:09 PM

## 2012-10-03 NOTE — Op Note (Signed)
SURGEON:  Thompson Grayer, MD     PREPROCEDURE DIAGNOSES:   1. Complete heart block   2. Paroxsymal Atrial fibrillation and atrial flutter    POSTPROCEDURE DIAGNOSES:   1. Complete heart block   2. Paroxsymal Atrial fibrillation and atrial flutter    PROCEDURES:   1. Pacemaker pulse generator replacement.   2. Skin pocket revision.     INTRODUCTION:  Tiffany [name] is a 79 y.o. female with a history of prior PPM placement for symptomatic bradycardia.  She has advanced to complete heart block and has no underlying escape rhythm chronically.  She has done well since her pacemaker was implanted. She has received appropriate atrial therapies for atrial fibrillation and atrial flutter with her prior Enrhythm device. She has recently reached ERI battery status.  She presents today for pacemaker pulse generator replacement.       DESCRIPTION OF THE PROCEDURE:  Informed written consent was obtained, and the patient was brought to the electrophysiology lab in the fasting state.  The patient's pacemaker was interrogated today and found to be at elective replacement indicator battery status.  The patient was adequately sedated with intravenous Versed and Fentanyl as outlined in the nursing report.  The patient's right chest was prepped and draped in the usual sterile fashion by the EP lab staff.  The skin overlying the existing pacemaker was infiltrated with lidocaine for local analgesia.  A 4-cm incision was made over the pacemaker pocket.  Using a combination of sharp and blunt dissection, the pacemaker was exposed and removed from the body.  The device was disconnected from the leads. A single silk suture was identified and removed which had secured the device to the pectoralis fascia.  There was no foreign matter or debris within the pocket.  The atrial lead was confirmed to be an Intermedics model 438-10 (serial number [id number]) lead implanted on 11/03/1997.  The right ventricular lead was confirmed to  be an Intermedics Model 438-10 (serial number [id number]) lead implanted on the same date as the atrial lead (above).  Both leads were examined and their integrity was confirmed to be intact.  Atrial lead P-waves (atypical atrial flutter) measured 0.4 mV with impedance of 288 ohms and a threshold of 0.8 V at 0.5 msec.  Right ventricular lead R-waves were not present due to complete heart block.  Her RV impedance was 272 ohms and a threshold of 0.6 V at 0.5 msec.  Both leads were connected to a Medtronic Advisa DR MRI model [id number] (serial number [id number]) pacemaker.  This device was chosen due to atrial therapies.  Her system is not considered to be MRI compatible given her leads.  The pocket was revised to accommodate this new device.  Electrocautery was required to assure hemostasis.  The pocket was irrigated with copious gentamicin solution. The pacemaker was then placed into the pocket.  The pocket was then closed in 2 layers with 2-0 Vicryl suture over the subcutaneous and subcuticular layers.  Steri-Strips and a sterile dressing were then applied.  There were no early apparent complications.     CONCLUSIONS:   1. Successful pacemaker pulse generator replacement for elective replacement indicator battery status with an indication of complete heart block  2. No early apparent complications.     Thompson Grayer, MD 10/03/2012 12:36 PM

## 2012-10-05 ENCOUNTER — Encounter (HOSPITAL_COMMUNITY): Payer: Self-pay | Admitting: *Deleted

## 2012-10-14 ENCOUNTER — Ambulatory Visit (INDEPENDENT_AMBULATORY_CARE_PROVIDER_SITE_OTHER): Payer: Medicare Other | Admitting: *Deleted

## 2012-10-14 DIAGNOSIS — I4891 Unspecified atrial fibrillation: Secondary | ICD-10-CM

## 2012-10-14 DIAGNOSIS — I495 Sick sinus syndrome: Secondary | ICD-10-CM

## 2012-10-14 LAB — PACEMAKER DEVICE OBSERVATION
AL AMPLITUDE: 0.25 mv
AL IMPEDENCE PM: 304 Ohm
ATRIAL PACING PM: 33.12
BAMS-0001: 140 {beats}/min
BATTERY VOLTAGE: 3.0349 V
BRDY-0002RV: 60 {beats}/min
BRDY-0003RV: 115 {beats}/min
BRDY-0004RV: 110 {beats}/min
RV LEAD IMPEDENCE PM: 285 Ohm
RV LEAD THRESHOLD: 1 V
VENTRICULAR PACING PM: 99.91

## 2012-10-14 NOTE — Progress Notes (Signed)
Wound check ppm in clinic. Normal device function. Pt in AF 7.4% of time. + Warfarin. Site well healed with no redness or swelling. ROV in 3 mths w/JA.

## 2012-10-16 ENCOUNTER — Ambulatory Visit: Payer: Medicare Other

## 2012-10-31 ENCOUNTER — Ambulatory Visit (INDEPENDENT_AMBULATORY_CARE_PROVIDER_SITE_OTHER): Payer: Medicare Other | Admitting: *Deleted

## 2012-10-31 DIAGNOSIS — I4891 Unspecified atrial fibrillation: Secondary | ICD-10-CM

## 2012-10-31 DIAGNOSIS — Z7901 Long term (current) use of anticoagulants: Secondary | ICD-10-CM

## 2012-10-31 DIAGNOSIS — I6789 Other cerebrovascular disease: Secondary | ICD-10-CM

## 2012-10-31 LAB — POCT INR: INR: 2.6

## 2012-11-04 ENCOUNTER — Encounter: Payer: Self-pay | Admitting: Internal Medicine

## 2012-12-12 ENCOUNTER — Ambulatory Visit (INDEPENDENT_AMBULATORY_CARE_PROVIDER_SITE_OTHER): Payer: Medicare Other | Admitting: General Practice

## 2012-12-12 DIAGNOSIS — I6789 Other cerebrovascular disease: Secondary | ICD-10-CM

## 2012-12-12 DIAGNOSIS — Z7901 Long term (current) use of anticoagulants: Secondary | ICD-10-CM

## 2012-12-12 DIAGNOSIS — I4891 Unspecified atrial fibrillation: Secondary | ICD-10-CM

## 2012-12-12 LAB — POCT INR: INR: 2.4

## 2013-01-15 ENCOUNTER — Encounter: Payer: Medicare Other | Admitting: Internal Medicine

## 2013-02-05 ENCOUNTER — Encounter: Payer: Self-pay | Admitting: Internal Medicine

## 2013-02-05 ENCOUNTER — Ambulatory Visit (INDEPENDENT_AMBULATORY_CARE_PROVIDER_SITE_OTHER): Payer: Medicare Other | Admitting: Internal Medicine

## 2013-02-05 ENCOUNTER — Ambulatory Visit (INDEPENDENT_AMBULATORY_CARE_PROVIDER_SITE_OTHER): Payer: Medicare Other | Admitting: Pharmacist

## 2013-02-05 VITALS — BP 139/85 | HR 74 | Ht 63.0 in | Wt 196.1 lb

## 2013-02-05 DIAGNOSIS — I6789 Other cerebrovascular disease: Secondary | ICD-10-CM

## 2013-02-05 DIAGNOSIS — Z7901 Long term (current) use of anticoagulants: Secondary | ICD-10-CM

## 2013-02-05 DIAGNOSIS — I428 Other cardiomyopathies: Secondary | ICD-10-CM

## 2013-02-05 DIAGNOSIS — I4891 Unspecified atrial fibrillation: Secondary | ICD-10-CM

## 2013-02-05 DIAGNOSIS — I495 Sick sinus syndrome: Secondary | ICD-10-CM

## 2013-02-05 LAB — MDC_IDC_ENUM_SESS_TYPE_INCLINIC
Battery Remaining Longevity: 84 mo
Battery Voltage: 3.01 V
Brady Statistic AP VP Percent: 38.23 %
Brady Statistic AP VS Percent: 0 %
Brady Statistic AS VP Percent: 61.75 %
Brady Statistic AS VS Percent: 0.02 %
Brady Statistic RA Percent Paced: 38.24 %
Brady Statistic RV Percent Paced: 99.98 %
Date Time Interrogation Session: 20141210124919
Lead Channel Impedance Value: 285 Ohm
Lead Channel Impedance Value: 304 Ohm
Lead Channel Impedance Value: 323 Ohm
Lead Channel Impedance Value: 323 Ohm
Lead Channel Pacing Threshold Amplitude: 1 V
Lead Channel Pacing Threshold Amplitude: 1.25 V
Lead Channel Pacing Threshold Pulse Width: 0.4 ms
Lead Channel Pacing Threshold Pulse Width: 0.4 ms
Lead Channel Sensing Intrinsic Amplitude: 0.125 mV
Lead Channel Sensing Intrinsic Amplitude: 11.125 mV
Lead Channel Setting Pacing Amplitude: 2 V
Lead Channel Setting Pacing Amplitude: 2.5 V
Lead Channel Setting Pacing Pulse Width: 0.4 ms
Lead Channel Setting Sensing Sensitivity: 8 mV
Zone Setting Detection Interval: 400 ms
Zone Setting Detection Interval: 430 ms

## 2013-02-05 LAB — POCT INR: INR: 2.5

## 2013-02-05 NOTE — Progress Notes (Signed)
The patient presents today for routine electrophysiology followup.  Since last being seen in our clinic, the patient reports doing very well. Today, she denies symptoms of palpitations, chest pain, shortness of breath,lower extremity edema, dizziness,syncope, or neurologic sequela.  The patient feels that she is tolerating medications without difficulties and is otherwise without complaint today.   Past Medical History  Diagnosis Date  . Stroke 1999  . Paroxysmal atrial fibrillation     and atrial tachycardia  . Tachycardia-bradycardia syndrome     s/p PPM  . DM2 (diabetes mellitus, type 2)   . Hyperlipidemia   . HTN (hypertension)   . Microalbuminuria     last done in 1/07   . Hypothyroidism   . Esophageal reflux    Past Surgical History  Procedure Laterality Date  . Hemorrhoid surgery    . Hysterectomy (other)    . Permanent pacemaker  1999    updated 2006 (MDT) by Dr Verlon Setting; gen change 09-2012 by Dr Rayann Heman (MDT)  . Cholecystectomy    . Pacemaker generator change  10/03/12    DMT Advisa DR gen change by Dr Rayann Heman    Current Outpatient Prescriptions  Medication Sig Dispense Refill  . alclomethasone (ACLOVATE) 0.05 % cream Apply 1 application topically 2 (two) times daily as needed (rash).      Marland Kitchen diltiazem (DILACOR XR) 180 MG 24 hr capsule Take 1 capsule (180 mg total) by mouth daily.  30 capsule  0  . furosemide (LASIX) 20 MG tablet Take 1 tablet (20 mg total) by mouth daily.  30 tablet  0  . glipiZIDE (GLUCOTROL) 10 MG tablet Take 10 mg by mouth 2 (two) times daily before a meal.      . Levothyroxine Sodium 25 MCG CAPS Take 1 capsule (25 mcg total) by mouth daily. NEEDS OFFICE VISIT/LABS  30 capsule  0  . metFORMIN (GLUCOPHAGE) 1000 MG tablet Take 1,000 mg by mouth 2 (two) times daily with a meal.      . metoprolol tartrate (LOPRESSOR) 25 MG tablet Take 25 mg by mouth 2 (two) times daily.      . mometasone-formoterol (DULERA) 200-5 MCG/ACT AERO Inhale 2 puffs into the lungs daily  as needed for wheezing or shortness of breath.      . potassium chloride SA (K-DUR,KLOR-CON) 20 MEQ tablet Take 20 mEq by mouth daily.      . ranitidine (ZANTAC) 150 MG capsule Take 150 mg by mouth 2 (two) times daily.        . rosuvastatin (CRESTOR) 10 MG tablet Take 1 tablet (10 mg total) by mouth daily. NEEDS OFFICE VISIT/LABS FOR MORE  30 tablet  0  . warfarin (COUMADIN) 2.5 MG tablet Take 1.25-2.5 mg by mouth daily. 2.5 mg daily except 1.25 mg on mondays and fridays       No current facility-administered medications for this visit.    No Known Allergies  History   Social History  . Marital Status: Divorced    Spouse Name: N/A    Number of Children: N/A  . Years of Education: N/A   Occupational History  . Not on file.   Social History Main Topics  . Smoking status: Never Smoker   . Smokeless tobacco: Never Used  . Alcohol Use: No  . Drug Use: No  . Sexual Activity: Not on file   Other Topics Concern  . Not on file   Social History Narrative   Lives in [place]. 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 Denies TED.    Physical Exam: Filed Vitals:   02/05/13 1223 02/05/13 1229  BP:  139/85  Pulse:  74  Height: 5\' 3"  (1.6 m) 5\' 3"  (1.6 m)  Weight: 196 lb 1.9 oz (88.959 kg) 196 lb 1.9 oz (88.959 kg)    GEN- The patient is well appearing, alert and oriented x 3 today.   Head- normocephalic, atraumatic Eyes-  Sclera clear, conjunctiva pink Ears- hearing intact Oropharynx- clear Neck- supple, no JVP Lymph- no cervical lymphadenopathy Lungs- Clear to ausculation bilaterally, normal work of breathing Chest- pacemaker pocket is well healed Heart- tachycardic Regular rate and rhythm, no murmurs, rubs or gallops, PMI not laterally displaced GI- soft, NT, ND, + BS Extremities- no clubbing, cyanosis, or edema  Pacemaker interrogation- reviewed in detail today,  See PACEART report  Assessment and Plan:  1. Paroxysmal atrial  fibrillation Stable No change required today Continue long term anticoagulation  2. Tachycardia/ bradycardia syndrome Normal pacemaker function See Pace Art report No changes today  [facility] every 3 months Return to see me in 9 months

## 2013-02-05 NOTE — Patient Instructions (Signed)
Your physician wants you to follow-up in: Aug 2015 with Dr Rayann Heman Dennis Bast will receive a reminder letter in the mail two months in advance. If you don't receive a letter, please call our office to schedule the follow-up appointment.   Remote monitoring is used to monitor your Pacemaker or ICD from home. This monitoring reduces the number of office visits required to check your device to one time per year. It allows [place] to keep an eye on the functioning of your device to ensure it is working properly. You are scheduled for a device check from home on 05/09/13. You may send your transmission at any time that day. If you have a wireless device, the transmission will be sent automatically. After your physician reviews your transmission, you will receive a postcard with your next transmission date.

## 2013-03-19 ENCOUNTER — Ambulatory Visit (INDEPENDENT_AMBULATORY_CARE_PROVIDER_SITE_OTHER): Payer: Medicare Other | Admitting: Pharmacist

## 2013-03-19 DIAGNOSIS — I4891 Unspecified atrial fibrillation: Secondary | ICD-10-CM

## 2013-03-19 DIAGNOSIS — I6789 Other cerebrovascular disease: Secondary | ICD-10-CM

## 2013-03-19 DIAGNOSIS — Z5181 Encounter for therapeutic drug level monitoring: Secondary | ICD-10-CM

## 2013-03-19 DIAGNOSIS — Z7901 Long term (current) use of anticoagulants: Secondary | ICD-10-CM

## 2013-03-19 LAB — POCT INR: INR: 2.7

## 2013-04-09 ENCOUNTER — Telehealth: Payer: Self-pay | Admitting: Pharmacist

## 2013-04-09 NOTE — Telephone Encounter (Signed)
Patient called an wants to change protimes to Dr. Kevan Ny with [facility] on [address].  She attempted to call their office, but tells me we needed to call their office to get this done.  I left a message with [facility] at [phone] with our desire to have them manage patient's warfarin moving forward, and they are to call us back with their response.  Will need to call patient back at [phone] with their decision.

## 2013-04-10 ENCOUNTER — Ambulatory Visit: Payer: Self-pay | Admitting: Pharmacist

## 2013-04-10 NOTE — Telephone Encounter (Signed)
Nurse from Dr. Randel Pigg office called back to let me know they would be happy to take over her protime management but just needed to get her current dose, last INR, and date of next protime.  I gave them all of this information, and they will call patient for protime visit.  I called patient to inform her of this, and she understands that she will now go to their office for coumadin management.

## 2013-05-06 ENCOUNTER — Telehealth: Payer: Self-pay | Admitting: Internal Medicine

## 2013-05-06 NOTE — Telephone Encounter (Signed)
Attempted to trouble shoot connection w/ pt but not successful w/ test transmission. Gave pt tech svcs #.

## 2013-05-06 NOTE — Telephone Encounter (Signed)
New Message:  Pt is requesting a call back to help her with the process of sending in a remote transmission.

## 2013-05-09 ENCOUNTER — Ambulatory Visit (INDEPENDENT_AMBULATORY_CARE_PROVIDER_SITE_OTHER): Payer: Medicare Other | Admitting: *Deleted

## 2013-05-09 DIAGNOSIS — I495 Sick sinus syndrome: Secondary | ICD-10-CM

## 2013-05-09 DIAGNOSIS — Z950 Presence of cardiac pacemaker: Secondary | ICD-10-CM

## 2013-05-09 DIAGNOSIS — I4891 Unspecified atrial fibrillation: Secondary | ICD-10-CM

## 2013-05-22 LAB — MDC_IDC_ENUM_SESS_TYPE_REMOTE
Battery Remaining Longevity: 77 mo
Battery Voltage: 2.99 V
Brady Statistic AP VP Percent: 44.56 %
Brady Statistic AP VS Percent: 0.01 %
Brady Statistic AS VP Percent: 55.27 %
Brady Statistic AS VS Percent: 0.16 %
Brady Statistic RA Percent Paced: 44.57 %
Brady Statistic RV Percent Paced: 99.84 %
Date Time Interrogation Session: 20150313003359
Lead Channel Impedance Value: 266 Ohm
Lead Channel Impedance Value: 285 Ohm
Lead Channel Impedance Value: 323 Ohm
Lead Channel Impedance Value: 323 Ohm
Lead Channel Pacing Threshold Amplitude: 1 V
Lead Channel Pacing Threshold Amplitude: 1.25 V
Lead Channel Pacing Threshold Pulse Width: 0.4 ms
Lead Channel Pacing Threshold Pulse Width: 0.4 ms
Lead Channel Sensing Intrinsic Amplitude: 0.125 mV
Lead Channel Sensing Intrinsic Amplitude: 0.125 mV
Lead Channel Sensing Intrinsic Amplitude: 17.5 mV
Lead Channel Sensing Intrinsic Amplitude: 17.5 mV
Lead Channel Setting Pacing Amplitude: 2.5 V
Lead Channel Setting Pacing Amplitude: 2.5 V
Lead Channel Setting Pacing Pulse Width: 0.4 ms
Lead Channel Setting Sensing Sensitivity: 8 mV
Zone Setting Detection Interval: 400 ms
Zone Setting Detection Interval: 430 ms

## 2013-06-12 ENCOUNTER — Ambulatory Visit: Payer: Medicare Other

## 2013-06-18 ENCOUNTER — Other Ambulatory Visit: Payer: Self-pay

## 2013-06-18 ENCOUNTER — Encounter: Payer: Self-pay | Admitting: Internal Medicine

## 2013-06-18 DIAGNOSIS — Z1231 Encounter for screening mammogram for malignant neoplasm of breast: Secondary | ICD-10-CM

## 2013-07-10 ENCOUNTER — Encounter (INDEPENDENT_AMBULATORY_CARE_PROVIDER_SITE_OTHER): Payer: Self-pay

## 2013-07-10 ENCOUNTER — Ambulatory Visit
Admission: RE | Admit: 2013-07-10 | Discharge: 2013-07-10 | Disposition: A | Discharge status: Home / Self Care | Payer: Medicare Other | Source: Ambulatory Visit

## 2013-07-10 DIAGNOSIS — Z1231 Encounter for screening mammogram for malignant neoplasm of breast: Secondary | ICD-10-CM

## 2013-08-12 ENCOUNTER — Telehealth: Payer: Self-pay | Admitting: Cardiology

## 2013-08-12 ENCOUNTER — Encounter: Payer: Medicare Other | Admitting: *Deleted

## 2013-08-12 ENCOUNTER — Telehealth: Payer: Self-pay | Admitting: *Deleted

## 2013-08-12 NOTE — Telephone Encounter (Signed)
Gave pt technical support number to call and receive help troubleshoot her monitor.

## 2013-08-12 NOTE — Telephone Encounter (Addendum)
Pt has [employer] Tiffany [name] w/ phone line plugged directly into modem port. Pt states she does not have any other phone jacks. Pt does have DSL filter. She has attempted to transmit w/ & w/out her DSL filter. Pt will attempt various connection options.

## 2013-08-13 ENCOUNTER — Encounter: Payer: Self-pay | Admitting: Cardiology

## 2013-08-18 NOTE — Telephone Encounter (Signed)
Pt has a coaxial modem through [employer]. Pt no longer has a functional analog phone line. Pt understands she will need to be checked in person in the clinic every six months. Made appt to see Dr. Rayann Heman 11/10/13.

## 2013-09-19 ENCOUNTER — Ambulatory Visit (INDEPENDENT_AMBULATORY_CARE_PROVIDER_SITE_OTHER): Payer: Medicare Other | Admitting: *Deleted

## 2013-09-19 ENCOUNTER — Encounter: Payer: Self-pay | Admitting: Internal Medicine

## 2013-09-19 DIAGNOSIS — I495 Sick sinus syndrome: Secondary | ICD-10-CM

## 2013-09-19 LAB — MDC_IDC_ENUM_SESS_TYPE_REMOTE
Battery Remaining Longevity: 66 mo
Battery Voltage: 2.99 V
Brady Statistic AP VP Percent: 82.55 %
Brady Statistic AP VS Percent: 0 %
Brady Statistic AS VP Percent: 17.45 %
Brady Statistic AS VS Percent: 0 %
Brady Statistic RA Percent Paced: 82.55 %
Brady Statistic RV Percent Paced: 100 %
Date Time Interrogation Session: 20150724133355
Lead Channel Impedance Value: 266 Ohm
Lead Channel Impedance Value: 285 Ohm
Lead Channel Impedance Value: 304 Ohm
Lead Channel Impedance Value: 323 Ohm
Lead Channel Pacing Threshold Amplitude: 1 V
Lead Channel Pacing Threshold Pulse Width: 0.4 ms
Lead Channel Sensing Intrinsic Amplitude: 0.125 mV
Lead Channel Sensing Intrinsic Amplitude: 0.125 mV
Lead Channel Sensing Intrinsic Amplitude: 11.25 mV
Lead Channel Sensing Intrinsic Amplitude: 11.25 mV
Lead Channel Setting Pacing Amplitude: 2.25 V
Lead Channel Setting Pacing Amplitude: 2.5 V
Lead Channel Setting Pacing Pulse Width: 0.4 ms
Lead Channel Setting Sensing Sensitivity: 8 mV
Zone Setting Detection Interval: 400 ms
Zone Setting Detection Interval: 430 ms

## 2013-09-19 NOTE — Progress Notes (Signed)
Remote pacemaker transmission.   

## 2013-10-09 ENCOUNTER — Encounter: Payer: Self-pay | Admitting: Cardiology

## 2013-11-10 ENCOUNTER — Ambulatory Visit (INDEPENDENT_AMBULATORY_CARE_PROVIDER_SITE_OTHER): Payer: Medicare Other | Admitting: Internal Medicine

## 2013-11-10 ENCOUNTER — Encounter: Payer: Self-pay | Admitting: Internal Medicine

## 2013-11-10 VITALS — BP 118/70 | HR 86 | Ht 61.0 in | Wt 194.0 lb

## 2013-11-10 DIAGNOSIS — I4891 Unspecified atrial fibrillation: Secondary | ICD-10-CM

## 2013-11-10 DIAGNOSIS — I495 Sick sinus syndrome: Secondary | ICD-10-CM

## 2013-11-10 DIAGNOSIS — I480 Paroxysmal atrial fibrillation: Secondary | ICD-10-CM

## 2013-11-10 LAB — MDC_IDC_ENUM_SESS_TYPE_INCLINIC
Battery Remaining Longevity: 66 mo
Battery Voltage: 2.99 V
Brady Statistic AP VP Percent: 72.86 %
Brady Statistic AP VS Percent: 0.01 %
Brady Statistic AS VP Percent: 27.08 %
Brady Statistic AS VS Percent: 0.05 %
Brady Statistic RA Percent Paced: 72.87 %
Brady Statistic RV Percent Paced: 99.94 %
Date Time Interrogation Session: 20150914152949
Lead Channel Impedance Value: 266 Ohm
Lead Channel Impedance Value: 285 Ohm
Lead Channel Impedance Value: 323 Ohm
Lead Channel Impedance Value: 323 Ohm
Lead Channel Pacing Threshold Amplitude: 1 V
Lead Channel Pacing Threshold Amplitude: 1.125 V
Lead Channel Pacing Threshold Pulse Width: 0.4 ms
Lead Channel Pacing Threshold Pulse Width: 0.4 ms
Lead Channel Sensing Intrinsic Amplitude: 0.25 mV
Lead Channel Sensing Intrinsic Amplitude: 0.25 mV
Lead Channel Sensing Intrinsic Amplitude: 10.25 mV
Lead Channel Sensing Intrinsic Amplitude: 10.25 mV
Lead Channel Setting Pacing Amplitude: 2.5 V
Lead Channel Setting Pacing Amplitude: 2.5 V
Lead Channel Setting Pacing Pulse Width: 0.4 ms
Lead Channel Setting Sensing Sensitivity: 8 mV
Zone Setting Detection Interval: 400 ms
Zone Setting Detection Interval: 430 ms

## 2013-11-10 NOTE — Patient Instructions (Signed)
Your physician wants you to follow-up in: 12 months with Dr Vallery Ridge will receive a reminder letter in the mail two months in advance. If you don't receive a letter, please call our office to schedule the follow-up appointment.    Remote monitoring is used to monitor your Pacemaker or ICD from home. This monitoring reduces the number of office visits required to check your device to one time per year. It allows [place] to keep an eye on the functioning of your device to ensure it is working properly. You are scheduled for a device check from home on 02/09/14. You may send your transmission at any time that day. If you have a wireless device, the transmission will be sent automatically. After your physician reviews your transmission, you will receive a postcard with your next transmission date.

## 2013-11-10 NOTE — Progress Notes (Signed)
The patient presents today for routine electrophysiology followup.  Since last being seen in our clinic, the patient reports doing very well. Today, she denies symptoms of palpitations, chest pain, shortness of breath,lower extremity edema, dizziness,syncope, or neurologic sequela.  The patient feels that she is tolerating medications without difficulties and is otherwise without complaint today.   Past Medical History  Diagnosis Date  . Stroke 1999  . Paroxysmal atrial fibrillation     and atrial tachycardia  . Tachycardia-bradycardia syndrome     s/p PPM  . DM2 (diabetes mellitus, type 2)   . Hyperlipidemia   . HTN (hypertension)   . Microalbuminuria     last done in 1/07   . Hypothyroidism   . Esophageal reflux    Past Surgical History  Procedure Laterality Date  . Hemorrhoid surgery    . Hysterectomy (other)    . Permanent pacemaker  1999    updated 2006 (MDT) by Dr Verlon Setting; gen change 09-2012 by Dr Rayann Heman (MDT)  . Cholecystectomy    . Pacemaker generator change  10/03/12    DMT Advisa DR gen change by Dr Rayann Heman    Current Outpatient Prescriptions  Medication Sig Dispense Refill  . alclomethasone (ACLOVATE) 0.05 % cream Apply 1 application topically 2 (two) times daily as needed (rash).      Marland Kitchen diltiazem (DILACOR XR) 180 MG 24 hr capsule Take 1 capsule (180 mg total) by mouth daily.  30 capsule  0  . furosemide (LASIX) 20 MG tablet Take 1 tablet (20 mg total) by mouth daily.  30 tablet  0  . glipiZIDE (GLUCOTROL) 10 MG tablet Take 10 mg by mouth 2 (two) times daily before a meal.      . Levothyroxine Sodium 25 MCG CAPS Take 1 capsule (25 mcg total) by mouth daily. NEEDS OFFICE VISIT/LABS  30 capsule  0  . metFORMIN (GLUCOPHAGE) 1000 MG tablet Take 1,000 mg by mouth 2 (two) times daily with a meal.      . metoprolol tartrate (LOPRESSOR) 25 MG tablet Take 25 mg by mouth 2 (two) times daily.      . mometasone-formoterol (DULERA) 200-5 MCG/ACT AERO Inhale 2 puffs into the lungs daily  as needed for wheezing or shortness of breath.      . potassium chloride SA (K-DUR,KLOR-CON) 20 MEQ tablet Take 20 mEq by mouth daily.      . ranitidine (ZANTAC) 150 MG capsule Take 150 mg by mouth 2 (two) times daily.        . rosuvastatin (CRESTOR) 10 MG tablet Take 1 tablet (10 mg total) by mouth daily. NEEDS OFFICE VISIT/LABS FOR MORE  30 tablet  0  . warfarin (COUMADIN) 2.5 MG tablet Take 1.25-2.5 mg by mouth daily. 2.5 mg daily except 1.25 mg on mondays and fridays       No current facility-administered medications for this visit.    No Known Allergies  History   Social History  . Marital Status: Divorced    Spouse Name: N/A    Number of Children: N/A  . Years of Education: N/A   Occupational History  . Not on file.   Social History Main Topics  . Smoking status: Never Smoker   . Smokeless tobacco: Never Used  . Alcohol Use: No  . Drug Use: No  . Sexual Activity: Not on file   Other Topics Concern  . Not on file   Social History Narrative   Lives in [place]. 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 Denies TED.    Physical Exam: Filed Vitals:   11/10/13 1209  BP: 118/70  Pulse: 86  Height: 5\' 1"  (1.549 m)  Weight: 194 lb (87.998 kg)    GEN- The patient is well appearing, alert and oriented x 3 today.   Head- normocephalic, atraumatic Eyes-  Sclera clear, conjunctiva pink Ears- hearing intact Oropharynx- clear Neck- supple, no JVP Lymph- no cervical lymphadenopathy Lungs- Clear to ausculation bilaterally, normal work of breathing Chest- pacemaker pocket is well healed Heart- tachycardic Regular rate and rhythm, no murmurs, rubs or gallops, PMI not laterally displaced GI- soft, NT, ND, + BS Extremities- no clubbing, cyanosis, or edema  Pacemaker interrogation- reviewed in detail today,  See PACEART report  Assessment and Plan:  1. Paroxysmal atrial fibrillation Stable No change required today Continue long term  anticoagulation  2. Tachycardia/ bradycardia syndrome Normal pacemaker function See Pace Art report No changes today  [facility] every 3 months Return to see me in 1 year

## 2013-11-18 ENCOUNTER — Inpatient Hospital Stay (HOSPITAL_COMMUNITY)
Admission: EM | Admit: 2013-11-18 | Discharge: 2013-11-22 | DRG: 378 | Disposition: A | Discharge status: Home / Self Care | Payer: Medicare Other | Attending: Internal Medicine | Admitting: Internal Medicine

## 2013-11-18 ENCOUNTER — Encounter (HOSPITAL_COMMUNITY): Payer: Self-pay | Admitting: Emergency Medicine

## 2013-11-18 DIAGNOSIS — T45515A Adverse effect of anticoagulants, initial encounter: Secondary | ICD-10-CM | POA: Diagnosis present

## 2013-11-18 DIAGNOSIS — K219 Gastro-esophageal reflux disease without esophagitis: Secondary | ICD-10-CM | POA: Diagnosis present

## 2013-11-18 DIAGNOSIS — I428 Other cardiomyopathies: Secondary | ICD-10-CM | POA: Diagnosis present

## 2013-11-18 DIAGNOSIS — K644 Residual hemorrhoidal skin tags: Secondary | ICD-10-CM | POA: Diagnosis present

## 2013-11-18 DIAGNOSIS — I10 Essential (primary) hypertension: Secondary | ICD-10-CM | POA: Diagnosis present

## 2013-11-18 DIAGNOSIS — K921 Melena: Secondary | ICD-10-CM

## 2013-11-18 DIAGNOSIS — Z7901 Long term (current) use of anticoagulants: Secondary | ICD-10-CM

## 2013-11-18 DIAGNOSIS — D126 Benign neoplasm of colon, unspecified: Secondary | ICD-10-CM | POA: Diagnosis present

## 2013-11-18 DIAGNOSIS — D62 Acute posthemorrhagic anemia: Secondary | ICD-10-CM | POA: Diagnosis present

## 2013-11-18 DIAGNOSIS — Z8673 Personal history of transient ischemic attack (TIA), and cerebral infarction without residual deficits: Secondary | ICD-10-CM

## 2013-11-18 DIAGNOSIS — K5731 Diverticulosis of large intestine without perforation or abscess with bleeding: Secondary | ICD-10-CM | POA: Diagnosis present

## 2013-11-18 DIAGNOSIS — N390 Urinary tract infection, site not specified: Secondary | ICD-10-CM | POA: Diagnosis present

## 2013-11-18 DIAGNOSIS — E119 Type 2 diabetes mellitus without complications: Secondary | ICD-10-CM | POA: Diagnosis present

## 2013-11-18 DIAGNOSIS — Z950 Presence of cardiac pacemaker: Secondary | ICD-10-CM | POA: Diagnosis not present

## 2013-11-18 DIAGNOSIS — E785 Hyperlipidemia, unspecified: Secondary | ICD-10-CM | POA: Diagnosis present

## 2013-11-18 DIAGNOSIS — E039 Hypothyroidism, unspecified: Secondary | ICD-10-CM | POA: Diagnosis present

## 2013-11-18 DIAGNOSIS — R791 Abnormal coagulation profile: Secondary | ICD-10-CM | POA: Diagnosis present

## 2013-11-18 DIAGNOSIS — I4891 Unspecified atrial fibrillation: Secondary | ICD-10-CM | POA: Diagnosis present

## 2013-11-18 DIAGNOSIS — D6832 Hemorrhagic disorder due to extrinsic circulating anticoagulants: Secondary | ICD-10-CM

## 2013-11-18 DIAGNOSIS — R197 Diarrhea, unspecified: Secondary | ICD-10-CM | POA: Diagnosis present

## 2013-11-18 DIAGNOSIS — I482 Chronic atrial fibrillation, unspecified: Secondary | ICD-10-CM

## 2013-11-18 HISTORY — DX: Diverticulosis of intestine, part unspecified, without perforation or abscess without bleeding: K57.90

## 2013-11-18 HISTORY — DX: Hypothyroidism, unspecified: E03.9

## 2013-11-18 LAB — PROTIME-INR
INR: 2.28 — ABNORMAL HIGH (ref 0.00–1.49)
Prothrombin Time: 25.1 seconds — ABNORMAL HIGH (ref 11.6–15.2)

## 2013-11-18 LAB — CBC
HCT: 36.1 % (ref 36.0–46.0)
Hemoglobin: 11.4 g/dL — ABNORMAL LOW (ref 12.0–15.0)
MCH: 26.8 pg (ref 26.0–34.0)
MCHC: 31.6 g/dL (ref 30.0–36.0)
MCV: 84.9 fL (ref 78.0–100.0)
Platelets: 202 10*3/uL (ref 150–400)
RBC: 4.25 MIL/uL (ref 3.87–5.11)
RDW: 17.2 % — ABNORMAL HIGH (ref 11.5–15.5)
WBC: 7.1 10*3/uL (ref 4.0–10.5)

## 2013-11-18 LAB — COMPREHENSIVE METABOLIC PANEL
ALT: 22 U/L (ref 0–35)
AST: 19 U/L (ref 0–37)
Albumin: 3.8 g/dL (ref 3.5–5.2)
Alkaline Phosphatase: 62 U/L (ref 39–117)
Anion gap: 15 (ref 5–15)
BUN: 23 mg/dL (ref 6–23)
CO2: 23 mEq/L (ref 19–32)
Calcium: 9.4 mg/dL (ref 8.4–10.5)
Chloride: 101 mEq/L (ref 96–112)
Creatinine, Ser: 1.11 mg/dL — ABNORMAL HIGH (ref 0.50–1.10)
GFR calc Af Amer: 53 mL/min — ABNORMAL LOW (ref >90)
GFR calc non Af Amer: 46 mL/min — ABNORMAL LOW (ref >90)
Glucose, Bld: 279 mg/dL — ABNORMAL HIGH (ref 70–99)
Potassium: 4.8 mEq/L (ref 3.7–5.3)
Sodium: 139 mEq/L (ref 137–147)
Total Bilirubin: 0.3 mg/dL (ref 0.3–1.2)
Total Protein: 7.5 g/dL (ref 6.0–8.3)

## 2013-11-18 MED ORDER — PHYTONADIONE 5 MG PO TABS
2.5000 mg | ORAL_TABLET | Freq: Once | ORAL | Status: AC
  Administered 2013-11-18: 2.5 mg via ORAL
  Filled 2013-11-18: qty 1

## 2013-11-18 MED ORDER — INSULIN ASPART 100 UNIT/ML ~~LOC~~ SOLN
0.0000 [IU] | SUBCUTANEOUS | Status: DC
  Administered 2013-11-19: 3 [IU] via SUBCUTANEOUS
  Administered 2013-11-19: 2 [IU] via SUBCUTANEOUS
  Administered 2013-11-19: 3 [IU] via SUBCUTANEOUS
  Administered 2013-11-19 – 2013-11-20 (×2): 5 [IU] via SUBCUTANEOUS
  Administered 2013-11-20 (×2): 1 [IU] via SUBCUTANEOUS
  Administered 2013-11-20: 3 [IU] via SUBCUTANEOUS
  Administered 2013-11-20 – 2013-11-21 (×2): 5 [IU] via SUBCUTANEOUS
  Administered 2013-11-21: 3 [IU] via SUBCUTANEOUS
  Administered 2013-11-21: 2 [IU] via SUBCUTANEOUS
  Administered 2013-11-21: 3 [IU] via SUBCUTANEOUS

## 2013-11-18 MED ORDER — SODIUM CHLORIDE 0.9 % IJ SOLN
3.0000 mL | Freq: Two times a day (BID) | INTRAMUSCULAR | Status: DC
  Administered 2013-11-20 – 2013-11-21 (×3): 3 mL via INTRAVENOUS

## 2013-11-18 MED ORDER — SODIUM CHLORIDE 0.9 % IV SOLN
INTRAVENOUS | Status: AC
  Administered 2013-11-18: via INTRAVENOUS

## 2013-11-18 MED ORDER — SODIUM CHLORIDE 0.9 % IV BOLUS (SEPSIS)
1000.0000 mL | Freq: Once | INTRAVENOUS | Status: AC
  Administered 2013-11-18: 1000 mL via INTRAVENOUS

## 2013-11-18 NOTE — ED Notes (Signed)
Per EMS: Pt reports that she had 1 episode of bowel incontinence with dark tarry blood in loose stool. Reports intermittent bleeding with movement. Pt takes Coumadin. BP 149/77. Pt is AO x4, denies dizziness or lightheadedness. Denies abdominal pain. Hx: hemorrhoids.

## 2013-11-18 NOTE — H&P (Signed)
PCP:   Marlou Sa ERIC, MD   Chief Complaint:  Rectal bleeding  HPI: 80 yo female with painless rectal bleeding bright red blood with clots started earlier today several episodes.  No n/v.  No abd pain.  No prior h/o gib.  Last colonoscopy over 5 years ago maybe 14.  No fevers.  No recent illness.  She is on coumadin for afib.  Review of Systems:  Positive and negative as per HPI otherwise all other systems are negative  Past Medical History: Past Medical History  Diagnosis Date  . Stroke 1999  . Paroxysmal atrial fibrillation     and atrial tachycardia  . Tachycardia-bradycardia syndrome     s/p PPM  . DM2 (diabetes mellitus, type 2)   . Hyperlipidemia   . HTN (hypertension)   . Microalbuminuria     last done in 1/07   . Hypothyroidism   . Esophageal reflux    Past Surgical History  Procedure Laterality Date  . Hemorrhoid surgery    . Hysterectomy (other)    . Permanent pacemaker  1999    updated 2006 (MDT) by Dr Verlon Setting; gen change 09-2012 by Dr Rayann Heman (MDT)  . Cholecystectomy    . Pacemaker generator change  10/03/12    DMT Advisa DR gen change by Dr Rayann Heman    Medications: Prior to Admission medications   Medication Sig Start Date End Date Taking? Authorizing Provider  alclomethasone (ACLOVATE) 0.05 % cream Apply 1 application topically 2 (two) times daily as needed (rash).    Historical Provider, MD  diltiazem (DILACOR XR) 180 MG 24 hr capsule Take 1 capsule (180 mg total) by mouth daily. 12/07/11   Posey Boyer, MD  furosemide (LASIX) 20 MG tablet Take 1 tablet (20 mg total) by mouth daily. 12/07/11   Posey Boyer, MD  glipiZIDE (GLUCOTROL) 10 MG tablet Take 10 mg by mouth 2 (two) times daily before a meal.    Historical Provider, MD  Levothyroxine Sodium 25 MCG CAPS Take 1 capsule (25 mcg total) by mouth daily. NEEDS OFFICE VISIT/LABS 12/07/11   Posey Boyer, MD  metFORMIN (GLUCOPHAGE) 1000 MG tablet Take 1,000 mg by mouth 2 (two) times daily with a meal.     Historical Provider, MD  metoprolol tartrate (LOPRESSOR) 25 MG tablet Take 25 mg by mouth 2 (two) times daily.    Historical Provider, MD  mometasone-formoterol (DULERA) 200-5 MCG/ACT AERO Inhale 2 puffs into the lungs daily as needed for wheezing or shortness of breath.    Historical Provider, MD  potassium chloride SA (K-DUR,KLOR-CON) 20 MEQ tablet Take 20 mEq by mouth daily.    Historical Provider, MD  ranitidine (ZANTAC) 150 MG capsule Take 150 mg by mouth 2 (two) times daily.      Historical Provider, MD  rosuvastatin (CRESTOR) 10 MG tablet Take 1 tablet (10 mg total) by mouth daily. NEEDS OFFICE VISIT/LABS FOR MORE 12/07/11   Posey Boyer, MD  warfarin (COUMADIN) 2.5 MG tablet Take 1.25-2.5 mg by mouth daily. 2.5 mg daily except 1.25 mg on mondays and fridays    Historical Provider, MD    Allergies:  No Known Allergies  Social History:  reports that she has never smoked. She has never used smokeless tobacco. She reports that she does not drink alcohol or use illicit drugs.  Family History: None   Physical Exam: Filed Vitals:   11/18/13 1935  BP: 147/60  Pulse: 71  Temp: 99.3 F (37.4 C)  TempSrc: Oral  Resp: 18  SpO2: 96%   General appearance: alert, cooperative and no distress Head: Normocephalic, without obvious abnormality, atraumatic Eyes: negative Nose: Nares normal. Septum midline. Mucosa normal. No drainage or sinus tenderness. Neck: no JVD and supple, symmetrical, trachea midline Lungs: clear to auscultation bilaterally Heart: regular rate and rhythm, S1, S2 normal, no murmur, click, rub or gallop Abdomen: soft, non-tender; bowel sounds normal; no masses,  no organomegaly Extremities: extremities normal, atraumatic, no cyanosis or edema Pulses: 2+ and symmetric Skin: Skin color, texture, turgor normal. No rashes or lesions Neurologic: Grossly normal    Labs on Admission:   Recent Labs  11/18/13 1945  NA 139  K 4.8  CL 101  CO2 23  GLUCOSE 279*   BUN 23  CREATININE 1.11*  CALCIUM 9.4    Recent Labs  11/18/13 1945  AST 19  ALT 22  ALKPHOS 62  BILITOT 0.3  PROT 7.5  ALBUMIN 3.8    Recent Labs  11/18/13 1945  WBC 7.1  HGB 11.4*  HCT 36.1  MCV 84.9  PLT 202   Radiological Exams on Admission: No results found.  Assessment/Plan  80 yo female with brbpr painless  Principal Problem:   Hematochezia-  Etiology unclear.  Hgb over 11.  Vss.  Will reverse coumadin with some vit k due to active bleeding, inr is 2.5.  Keep npo overnight and will need to call GI in am for scoping.  Serial h/h q hrs and transfuse if significant drop or deterioration of vitals.  Suspect diverticular bleed, but could be from hemorrhoids or polyp.  Active Problems:  Stable unless o/w noted   CARDIOMYOPATHY   Atrial fibrillation-  Rate controlled.   Pacemaker-Medtronic   Warfarin-induced coagulopathy-  Vit k 2.5 mg po given. Hold coumadin.  Admit to tele.  Full code.  Lavelle Akel A 11/18/2013, 8:58 PM

## 2013-11-18 NOTE — ED Provider Notes (Signed)
CSN: [id number]     Arrival date & time 11/18/13  1933 History   First MD Initiated Contact with Patient 11/18/13 1941     Chief Complaint  Patient presents with  . Rectal Bleeding    Patient is a 80 y.o. female presenting with hematochezia. The history is provided by the patient.  Rectal Bleeding Quality:  Bright red Amount:  Copious Timing:  Constant Progression:  Unchanged Chronicity:  New Context: spontaneously   Context: not constipation, not diarrhea, not hemorrhoids and not rectal pain   Relieved by:  Nothing Associated symptoms: no abdominal pain, no dizziness, no fever, no hematemesis, no light-headedness, no loss of consciousness, no recent illness and no vomiting   Risk factors: anticoagulant use   Risk factors: no NSAID use    Pt notes painless bleeding from rectum, not associated with bowel movement. Compliant with coumadin for a fib.  Has history of diverticulosis on CT imaging previously  Past Medical History  Diagnosis Date  . Stroke 1999  . Paroxysmal atrial fibrillation     and atrial tachycardia  . Tachycardia-bradycardia syndrome     s/p PPM  . DM2 (diabetes mellitus, type 2)   . Hyperlipidemia   . HTN (hypertension)   . Microalbuminuria     last done in 1/07   . Hypothyroidism   . Esophageal reflux    Past Surgical History  Procedure Laterality Date  . Hemorrhoid surgery    . Hysterectomy (other)    . Permanent pacemaker  1999    updated 2006 (MDT) by Dr Verlon Setting; gen change 09-2012 by Dr Rayann Heman (MDT)  . Cholecystectomy    . Pacemaker generator change  10/03/12    DMT Advisa DR gen change by Dr Rayann Heman   No family history on file. History  Substance Use Topics  . Smoking status: Never Smoker   . Smokeless tobacco: Never Used  . Alcohol Use: No   OB History   Grav Para Term Preterm Abortions TAB SAB Ect Mult Living                 Review of Systems  Constitutional: Negative for fever, chills, diaphoresis and appetite change.  Respiratory:  Negative for cough and shortness of breath.   Gastrointestinal: Positive for hematochezia. Negative for nausea, vomiting, abdominal pain, diarrhea, constipation and hematemesis.  Genitourinary: Negative for vaginal bleeding and vaginal discharge.  Musculoskeletal: Negative for back pain.  Skin: Negative for pallor.  Neurological: Negative for dizziness, loss of consciousness, syncope, weakness, light-headedness and headaches.  All other systems reviewed and are negative.    Allergies  Review of patient's allergies indicates no known allergies.  Home Medications   Prior to Admission medications   Medication Sig Start Date End Date Taking? Authorizing Provider  alclomethasone (ACLOVATE) 0.05 % cream Apply 1 application topically 2 (two) times daily as needed (rash).    Historical Provider, MD  diltiazem (DILACOR XR) 180 MG 24 hr capsule Take 1 capsule (180 mg total) by mouth daily. 12/07/11   Posey Boyer, MD  furosemide (LASIX) 20 MG tablet Take 1 tablet (20 mg total) by mouth daily. 12/07/11   Posey Boyer, MD  glipiZIDE (GLUCOTROL) 10 MG tablet Take 10 mg by mouth 2 (two) times daily before a meal.    Historical Provider, MD  Levothyroxine Sodium 25 MCG CAPS Take 1 capsule (25 mcg total) by mouth daily. NEEDS OFFICE VISIT/LABS 12/07/11   Posey Boyer, MD  metFORMIN (GLUCOPHAGE) 1000 MG tablet Take  1,000 mg by mouth 2 (two) times daily with a meal.    Historical Provider, MD  metoprolol tartrate (LOPRESSOR) 25 MG tablet Take 25 mg by mouth 2 (two) times daily.    Historical Provider, MD  mometasone-formoterol (DULERA) 200-5 MCG/ACT AERO Inhale 2 puffs into the lungs daily as needed for wheezing or shortness of breath.    Historical Provider, MD  potassium chloride SA (K-DUR,KLOR-CON) 20 MEQ tablet Take 20 mEq by mouth daily.    Historical Provider, MD  ranitidine (ZANTAC) 150 MG capsule Take 150 mg by mouth 2 (two) times daily.      Historical Provider, MD  rosuvastatin (CRESTOR) 10  MG tablet Take 1 tablet (10 mg total) by mouth daily. NEEDS OFFICE VISIT/LABS FOR MORE 12/07/11   Posey Boyer, MD  warfarin (COUMADIN) 2.5 MG tablet Take 1.25-2.5 mg by mouth daily. 2.5 mg daily except 1.25 mg on mondays and fridays    Historical Provider, MD   BP 147/60  Pulse 71  Temp(Src) 99.3 F (37.4 C) (Oral)  Resp 18  SpO2 96% Physical Exam  Nursing note and vitals reviewed. Constitutional: She is oriented to person, place, and time. She appears well-developed and well-nourished. No distress.  HENT:  Head: Normocephalic and atraumatic.  Nose: Nose normal.  Eyes: Conjunctivae are normal.  No conj pallor   Neck: Normal range of motion. Neck supple. No tracheal deviation present.  Cardiovascular: Normal rate, regular rhythm and normal heart sounds.   No murmur heard. Pulmonary/Chest: Effort normal and breath sounds normal. No respiratory distress. She has no wheezes. She has no rales.  Abdominal: Soft. Bowel sounds are normal. She exhibits no distension and no mass. There is no tenderness.  Genitourinary:  No external or internal hemorrhoids, no fissures.  Brigt red blood oozing from rectum.    Musculoskeletal: Normal range of motion. She exhibits no edema and no tenderness.  Neurological: She is alert and oriented to person, place, and time.  Skin: Skin is warm and dry. No rash noted. No pallor.  Psychiatric: She has a normal mood and affect.    ED Course  Procedures (including critical care time) Labs Review Labs Reviewed  PROTIME-INR - Abnormal; Notable for the following:    Prothrombin Time 25.1 (*)    INR 2.28 (*)    All other components within normal limits  CBC - Abnormal; Notable for the following:    Hemoglobin 11.4 (*)    RDW 17.2 (*)    All other components within normal limits  COMPREHENSIVE METABOLIC PANEL - Abnormal; Notable for the following:    Glucose, Bld 279 (*)    Creatinine, Ser 1.11 (*)    GFR calc non Af Amer 46 (*)    GFR calc Af Amer 53  (*)    All other components within normal limits  HEMOGLOBIN AND HEMATOCRIT, BLOOD  HEMOGLOBIN AND HEMATOCRIT, BLOOD  TYPE AND SCREEN    Imaging Review No results found.   EKG Interpretation None      MDM   Final diagnoses:  Hematochezia  Warfarin-induced coagulopathy    Pt presents with BRBPR.  VSS, no pallor.  AAOx3. Exam without obvious source of external bleeding.  BRBPR oozing internally.  Suspect diverticulosis given hx of such and current painless rectal bleeding. Exacerbated by coumadin use, therapeutic.  No signs of peritonitis or systemic toxicity to suggest surgical emergency. Hgb stable.  Consulted hospitalist who requests 2.5mg  PO vit K.    Tammy Sours, MD 11/18/13 [phone]

## 2013-11-18 NOTE — ED Notes (Signed)
Pts family would like to be contacted with any changes.   Daughter [facility]: [phone]                           Cell: [phone]                           Work: [phone] Ext: [phone]  Daughter [place]: [phone]

## 2013-11-19 DIAGNOSIS — I4891 Unspecified atrial fibrillation: Secondary | ICD-10-CM

## 2013-11-19 LAB — CBC
HCT: 29.7 % — ABNORMAL LOW (ref 36.0–46.0)
Hemoglobin: 9.6 g/dL — ABNORMAL LOW (ref 12.0–15.0)
MCH: 27.2 pg (ref 26.0–34.0)
MCHC: 32.3 g/dL (ref 30.0–36.0)
MCV: 84.1 fL (ref 78.0–100.0)
Platelets: 181 10*3/uL (ref 150–400)
RBC: 3.53 MIL/uL — ABNORMAL LOW (ref 3.87–5.11)
RDW: 16.7 % — ABNORMAL HIGH (ref 11.5–15.5)
WBC: 8.3 10*3/uL (ref 4.0–10.5)

## 2013-11-19 LAB — GLUCOSE, CAPILLARY
Glucose-Capillary: 168 mg/dL — ABNORMAL HIGH (ref 70–99)
Glucose-Capillary: 206 mg/dL — ABNORMAL HIGH (ref 70–99)
Glucose-Capillary: 192 mg/dL — ABNORMAL HIGH (ref 70–99)
Glucose-Capillary: 252 mg/dL — ABNORMAL HIGH (ref 70–99)
Glucose-Capillary: 214 mg/dL — ABNORMAL HIGH (ref 70–99)

## 2013-11-19 LAB — HEMOGLOBIN AND HEMATOCRIT, BLOOD
HCT: 30.8 % — ABNORMAL LOW (ref 36.0–46.0)
HCT: 28.7 % — ABNORMAL LOW (ref 36.0–46.0)
Hemoglobin: 9.7 g/dL — ABNORMAL LOW (ref 12.0–15.0)
Hemoglobin: 9.1 g/dL — ABNORMAL LOW (ref 12.0–15.0)

## 2013-11-19 LAB — PREPARE RBC (CROSSMATCH)

## 2013-11-19 LAB — PROTIME-INR
INR: 1.99 — ABNORMAL HIGH (ref 0.00–1.49)
Prothrombin Time: 22.6 seconds — ABNORMAL HIGH (ref 11.6–15.2)

## 2013-11-19 MED ORDER — VITAMIN K1 10 MG/ML IJ SOLN
1.0000 mg | Freq: Once | INTRAVENOUS | Status: AC
  Administered 2013-11-19: 1 mg via INTRAVENOUS
  Filled 2013-11-19: qty 0.1

## 2013-11-19 MED ORDER — SODIUM CHLORIDE 0.9 % IV SOLN
Freq: Once | INTRAVENOUS | Status: AC
  Administered 2013-11-19: 08:00:00 via INTRAVENOUS

## 2013-11-19 NOTE — Progress Notes (Signed)
Patient's case was discussed with the hospital team. She has had two colonoscopies by me, one in 2001 and one in 2006. With diverticuli only. And probably with diverticular bleed now. I will see her tomorrow but call if increased bleeding and clear liquid diet is ok. Use FFP if bleeding increases.

## 2013-11-19 NOTE — Progress Notes (Signed)
Patient continues to pass large clots mixed with bright red blood per rectum. Hgb drop of > 2gm/dL since admission. Patient is asymptomatic with stable vital signs. AV sequential pacing on telemetry with rate at 60 since admission. Notified Forrest Moron, NP on-call for [facility]. Plan to recheck CBC STAT.  Will notify on-call of result if within transfusion range.  Continuing to monitor closely.

## 2013-11-19 NOTE — Progress Notes (Signed)
Patient has had 5 episodes of bleeding with clots this shift. MD aware and ordered initially 2 units RBC but discontinued the 2nd unit. 1 unit RBC transfused without and reaction noted. Will continue to monitor.

## 2013-11-19 NOTE — Progress Notes (Addendum)
   [facility] PROGRESS NOTE Interim History: 80 yo female with painless rectal bleeding bright red blood with clots started earlier today several episodes. she is on coumadin for afib.   Assessment/Plan: Hematochezia - NPO, given vit K IV x1. - Last colonoscopy > 5 years ago not in system. - Had 2 medium size bloody BM about a cup. Transfuse 1 unit of PRBC. Consult GI. - check a CBC in am.  Warfarin-induced coagulopathy - INR pending, was giving Vit K.  Atrial fibrillation - Rate controlled, hold coumadin.   Code Status: full Family Communication: none  Disposition Plan: inpatient   Consultants:  GI  Procedures:  none  Antibiotics:  None  HPI/Subjective: Hungry painless bleeding. Want to get this over.  Objective: Filed Vitals:   11/18/13 2200 11/18/13 2230 11/18/13 2342 11/19/13 0530  BP: 145/52 136/45 153/64 119/52  Pulse: 60 60 65 60  Temp:   98.4 F (36.9 C) 98 F (36.7 C)  TempSrc:   Oral Oral  Resp: 14 19 20 20   Height:   5\' 1"  (1.549 m)   Weight:   87.635 kg (193 lb 3.2 oz)   SpO2: 96% 95% 96% 98%   No intake or output data in the 24 hours ending 11/19/13 0718 Filed Weights   11/18/13 2342  Weight: 87.635 kg (193 lb 3.2 oz)    Exam:  General: Alert, awake, oriented x3, in no acute distress.  HEENT: No bruits, no goiter.  Heart: Regular rate and rhythm. Lungs: Good air movement, clear Abdomen: Soft, nontender, nondistended, positive bowel sounds.    Data Reviewed: Basic Metabolic Panel:  Recent Labs Lab 11/18/13 1945  NA 139  K 4.8  CL 101  CO2 23  GLUCOSE 279*  BUN 23  CREATININE 1.11*  CALCIUM 9.4   Liver Function Tests:  Recent Labs Lab 11/18/13 1945  AST 19  ALT 22  ALKPHOS 62  BILITOT 0.3  PROT 7.5  ALBUMIN 3.8   No results found for this basename: LIPASE, AMYLASE,  in the last 168 hours No results found for this basename: AMMONIA,  in the last 168 hours CBC:  Recent Labs Lab 11/18/13 1945  11/19/13 0025 11/19/13 0535  WBC 7.1  --   --   HGB 11.4* 9.7* 9.1*  HCT 36.1 30.8* 28.7*  MCV 84.9  --   --   PLT 202  --   --    Cardiac Enzymes: No results found for this basename: CKTOTAL, CKMB, CKMBINDEX, TROPONINI,  in the last 168 hours BNP (last 3 results) No results found for this basename: PROBNP,  in the last 8760 hours CBG:  Recent Labs Lab 11/19/13 0524  GLUCAP 168*    No results found for this or any previous visit (from the past 240 hour(s)).   Studies: No results found.  Scheduled Meds: . insulin aspart  0-9 Units Subcutaneous 6 times per day  . sodium chloride  3 mL Intravenous Q12H   Continuous Infusions: . sodium chloride 75 mL/hr at 11/18/13 2347     Charlynne Cousins  [facility] Pager [phone]. If 8PM-8AM, please contact night-coverage at [email or web address], password [facility] 11/19/2013, 7:18 AM  LOS: 1 day

## 2013-11-19 NOTE — Progress Notes (Signed)
Patient complains of shakiness. Two large consecutive bloody stools with clots within the past 15 minutes. Notified Dr. Olevia Bowens verbally.  Orders to transfuse RBC received.  Continuing to monitor.

## 2013-11-19 NOTE — Progress Notes (Signed)
UR Completed Meili Kleckley Graves-Bigelow, RN,BSN [phone]  

## 2013-11-19 NOTE — Care Management Note (Unsigned)
    Page 1 of 1   11/21/2013     4:01:30 PM CARE MANAGEMENT NOTE 11/21/2013  Patient:  [facility] MA[facility]   Account Number:  [id number]  Date Initiated:  11/19/2013  Documentation initiated by:  GRAVES-BIGELOW,Aadit Hagood  Subjective/Objective Assessment:   Pt admitted for Rectal bleeding.GI to consult for possible Scope. Pt is from home alone, has family support.     Action/Plan:   CM will continue to monitor.   Anticipated DC Date:  11/21/2013   Anticipated DC Plan:  [place]  CM consult      Choice offered to / List presented to:             Status of service:  In process, will continue to follow Medicare Important Message given?  YES (If response is "NO", the following Medicare IM given date fields will be blank) Date Medicare IM given:  11/19/2013 Medicare IM given by:  GRAVES-BIGELOW,Dequann Vandervelden Date Additional Medicare IM given:  11/21/2013 Additional Medicare IM given by:  Braham  Discharge Disposition:    Per UR Regulation:  Reviewed for med. necessity/level of care/duration of stay  If discussed at [place] of Stay Meetings, dates discussed:    Comments:

## 2013-11-20 ENCOUNTER — Encounter (HOSPITAL_COMMUNITY): Payer: Self-pay | Admitting: Internal Medicine

## 2013-11-20 DIAGNOSIS — E039 Hypothyroidism, unspecified: Secondary | ICD-10-CM

## 2013-11-20 DIAGNOSIS — D62 Acute posthemorrhagic anemia: Secondary | ICD-10-CM | POA: Diagnosis present

## 2013-11-20 DIAGNOSIS — I10 Essential (primary) hypertension: Secondary | ICD-10-CM

## 2013-11-20 HISTORY — DX: Hypothyroidism, unspecified: E03.9

## 2013-11-20 LAB — CBC
HCT: 26.3 % — ABNORMAL LOW (ref 36.0–46.0)
HCT: 25.4 % — ABNORMAL LOW (ref 36.0–46.0)
Hemoglobin: 8.5 g/dL — ABNORMAL LOW (ref 12.0–15.0)
Hemoglobin: 8.3 g/dL — ABNORMAL LOW (ref 12.0–15.0)
MCH: 27.2 pg (ref 26.0–34.0)
MCH: 26.8 pg (ref 26.0–34.0)
MCHC: 32.3 g/dL (ref 30.0–36.0)
MCHC: 32.7 g/dL (ref 30.0–36.0)
MCV: 84.3 fL (ref 78.0–100.0)
MCV: 81.9 fL (ref 78.0–100.0)
Platelets: 157 10*3/uL (ref 150–400)
Platelets: 170 10*3/uL (ref 150–400)
RBC: 3.12 MIL/uL — ABNORMAL LOW (ref 3.87–5.11)
RBC: 3.1 MIL/uL — ABNORMAL LOW (ref 3.87–5.11)
RDW: 17 % — ABNORMAL HIGH (ref 11.5–15.5)
RDW: 16.9 % — ABNORMAL HIGH (ref 11.5–15.5)
WBC: 7.5 10*3/uL (ref 4.0–10.5)
WBC: 7.3 10*3/uL (ref 4.0–10.5)

## 2013-11-20 LAB — PROTIME-INR
INR: 1.53 — ABNORMAL HIGH (ref 0.00–1.49)
Prothrombin Time: 18.4 seconds — ABNORMAL HIGH (ref 11.6–15.2)

## 2013-11-20 LAB — GLUCOSE, CAPILLARY
Glucose-Capillary: 147 mg/dL — ABNORMAL HIGH (ref 70–99)
Glucose-Capillary: 149 mg/dL — ABNORMAL HIGH (ref 70–99)
Glucose-Capillary: 186 mg/dL — ABNORMAL HIGH (ref 70–99)
Glucose-Capillary: 235 mg/dL — ABNORMAL HIGH (ref 70–99)
Glucose-Capillary: 251 mg/dL — ABNORMAL HIGH (ref 70–99)

## 2013-11-20 LAB — HEMOGLOBIN A1C
Hgb A1c MFr Bld: 7.6 % — ABNORMAL HIGH (ref <5.7)
Mean Plasma Glucose: 171 mg/dL — ABNORMAL HIGH (ref <117)

## 2013-11-20 LAB — CLOSTRIDIUM DIFFICILE BY PCR: Toxigenic C. Difficile by PCR: NEGATIVE

## 2013-11-20 MED ORDER — POLYETHYLENE GLYCOL 3350 17 GM/SCOOP PO POWD
255.0000 g | Freq: Once | ORAL | Status: AC
  Administered 2013-11-20: 255 g via ORAL
  Filled 2013-11-20: qty 255

## 2013-11-20 MED ORDER — SODIUM CHLORIDE 0.9 % IV SOLN
INTRAVENOUS | Status: DC
  Administered 2013-11-21: 12:00:00 via INTRAVENOUS

## 2013-11-20 MED ORDER — ATORVASTATIN CALCIUM 20 MG PO TABS
20.0000 mg | ORAL_TABLET | Freq: Every day | ORAL | Status: DC
  Administered 2013-11-20 – 2013-11-21 (×2): 20 mg via ORAL
  Filled 2013-11-20 (×3): qty 1

## 2013-11-20 MED ORDER — MOMETASONE FURO-FORMOTEROL FUM 200-5 MCG/ACT IN AERO: 2.0000 | INHALATION_SPRAY | Freq: Every day | RESPIRATORY_TRACT | Status: DC | PRN

## 2013-11-20 MED ORDER — LEVOTHYROXINE SODIUM 25 MCG PO TABS
25.0000 ug | ORAL_TABLET | Freq: Every day | ORAL | Status: DC
  Administered 2013-11-21 – 2013-11-22 (×2): 25 ug via ORAL
  Filled 2013-11-20 (×3): qty 1

## 2013-11-20 NOTE — Progress Notes (Signed)
[facility] PROGRESS NOTE  Tiffany [name] [id number] DOB: Jun 20, 1931 DOA: 11/18/2013 PCP: Marlou Sa ERIC, MD  Assessment/Plan: #1 hematochezia Likely secondary to diverticular bleed. Patient has had 2 colonoscopies done per GI in 2001 and in 2006 which were positive for diverticula. Patient with multiple bloody bowel movements overnight however this morning seems to be improving. Hemoglobin currently at 8.5 from 11.4 on admission. Patient status post 1 unit packed red blood cells. Continue serial CBCs. Patient currently on clear liquids. Patient for colonoscopy in the morning. Coumadin on hold. Will defer to GI when to resume anticoagulation. GI following and appreciate input and recommendations.  #2 acute blood loss anemia Secondary to problem #1.  #3 history of atrial fibrillation Currently rate controlled. Diltiazem and metoprolol on hold. Coumadin on hold secondary to problem #1. Will discuss with cardiology as to whether patient's anticoagulation could be downgraded to Plavix or aspirin per GI request. Follow.  #4 warfarin-induced coagulopathy Patient status post vitamin K. INR currently a 1.53. Follow.  #5 diabetes mellitus Will check a hemoglobin A1c. CBGs have ranged from 147-186. Continue sliding scale insulin.  #6 hypothyroidism Continue home dose Synthroid.  #7 hypertension Stable. BP meds on hold.  #8 hyperlipidemia Resume statin.  #9 prophylaxis SCDs for DVT prophylaxis.  Code Status: Full Family Communication: Updated patient and daughters at bedside. Disposition Plan: Home when medically stable.   Consultants:  Gastroenterology: Dr. Watt Climes 11/19/2013  Procedures:  1 unit packed red blood cells 11/19/2013  Antibiotics:  None  HPI/Subjective: Per nursing the patient patient with multiple bloody bowel movements overnight. Patient with brown stool this morning. Patient currently sitting on the bedside commode.  Objective: Filed Vitals:   11/20/13 0500  BP: 129/48  Pulse: 66  Temp: 97.7 F (36.5 C)  Resp: 18    Intake/Output Summary (Last 24 hours) at 11/20/13 1029 Last data filed at 11/19/13 1734  Gross per 24 hour  Intake  891.5 ml  Output      0 ml  Net  891.5 ml   Filed Weights   11/18/13 2342 11/20/13 0500  Weight: 87.635 kg (193 lb 3.2 oz) 86.728 kg (191 lb 3.2 oz)    Exam:   General:  NAD  Cardiovascular: RRR with a grade 3/6 systolic ejection murmur  Respiratory: Clear to auscultation bilaterally  Abdomen: Soft, nontender, nondistended, positive bowel sounds  Musculoskeletal: No clubbing cyanosis or edema  Data Reviewed: Basic Metabolic Panel:  Recent Labs Lab 11/18/13 1945  NA 139  K 4.8  CL 101  CO2 23  GLUCOSE 279*  BUN 23  CREATININE 1.11*  CALCIUM 9.4   Liver Function Tests:  Recent Labs Lab 11/18/13 1945  AST 19  ALT 22  ALKPHOS 62  BILITOT 0.3  PROT 7.5  ALBUMIN 3.8   No results found for this basename: LIPASE, AMYLASE,  in the last 168 hours No results found for this basename: AMMONIA,  in the last 168 hours CBC:  Recent Labs Lab 11/18/13 1945 11/19/13 0025 11/19/13 0535 11/19/13 1827 11/20/13 0620  WBC 7.1  --   --  8.3 7.5  HGB 11.4* 9.7* 9.1* 9.6* 8.5*  HCT 36.1 30.8* 28.7* 29.7* 26.3*  MCV 84.9  --   --  84.1 84.3  PLT 202  --   --  181 157   Cardiac Enzymes: No results found for this basename: CKTOTAL, CKMB, CKMBINDEX, TROPONINI,  in the last 168 hours BNP (last 3 results) No results found for this basename: PROBNP,  in  the last 8760 hours CBG:  Recent Labs Lab 11/19/13 1557 11/19/13 2028 11/20/13 0044 11/20/13 0509 11/20/13 0813  GLUCAP 252* 214* 149* 147* 186*    Recent Results (from the past 240 hour(s))  CLOSTRIDIUM DIFFICILE BY PCR     Status: None   Collection Time    11/20/13  8:02 AM      Result Value Ref Range Status   C difficile by pcr NEGATIVE  NEGATIVE Final     Studies: No results found.  Scheduled Meds: .  insulin aspart  0-9 Units Subcutaneous 6 times per day  . sodium chloride  3 mL Intravenous Q12H   Continuous Infusions:   Principal Problem:   Hematochezia Active Problems:   Acute blood loss anemia   DIABETES MELLITUS, TYPE II   HYPERTENSION   CARDIOMYOPATHY   Atrial fibrillation   Pacemaker-Medtronic   Warfarin-induced coagulopathy   Unspecified hypothyroidism    Time spent: 65 minutes    [facility] MD [facility] Pager [phone]. If 7PM-7AM, please contact night-coverage at [email or web address], password [facility] 11/20/2013, 10:29 AM  LOS: 2 days

## 2013-11-20 NOTE — Progress Notes (Signed)
It was reported to this nurse that patient had up to 5 dark loose stools throughout the night.  Had one this am.  MD notified.  Hgb. Noted to be 8.5.

## 2013-11-20 NOTE — Consult Note (Signed)
Reason for Consult: Probable diverticular bleeding Referring Physician: Hospital team  Tiffany [name] is an 80 y.o. female.  HPI: Patient seen and examined and hospital computer reviewed as well as office chart and she had 2 previous colonoscopies by me the last one in 2006 which showed hyperplastic polyps and diverticuli and she's had chronic diarrhea requiring Imodium for years but no blood until the day of admission and had been on Coumadin for 15 years and she has no other GI complaints and her case was discussed with her self and her daughter and the medical team x2 and she does say her stool is brown this morning and her bleeding stopped yesterday Past Medical History  Diagnosis Date  . Stroke 1999  . Paroxysmal atrial fibrillation     and atrial tachycardia  . Tachycardia-bradycardia syndrome     s/p PPM  . DM2 (diabetes mellitus, type 2)   . Hyperlipidemia   . HTN (hypertension)   . Microalbuminuria     last done in 1/07   . Hypothyroidism   . Esophageal reflux   . Diverticulosis   . Unspecified hypothyroidism 11/20/2013    Past Surgical History  Procedure Laterality Date  . Hemorrhoid surgery    . Hysterectomy (other)    . Permanent pacemaker  1999    updated 2006 (MDT) by Dr Verlon Setting; gen change 09-2012 by Dr Rayann Heman (MDT)  . Cholecystectomy    . Pacemaker generator change  10/03/12    DMT Advisa DR gen change by Dr Rayann Heman    No family history on file.  Social History:  reports that she has never smoked. She has never used smokeless tobacco. She reports that she does not drink alcohol or use illicit drugs.  Allergies: No Known Allergies  Medications: I have reviewed the patient's current medications.  Results for orders placed during the hospital encounter of 11/18/13 (from the past 48 hour(s))  PROTIME-INR     Status: Abnormal   Collection Time    11/18/13  7:45 PM      Result Value Ref Range   Prothrombin Time 25.1 (*) 11.6 - 15.2 seconds   INR 2.28 (*)  0.00 - 1.49  CBC     Status: Abnormal   Collection Time    11/18/13  7:45 PM      Result Value Ref Range   WBC 7.1  4.0 - 10.5 K/uL   RBC 4.25  3.87 - 5.11 MIL/uL   Hemoglobin 11.4 (*) 12.0 - 15.0 g/dL   HCT 36.1  36.0 - 46.0 %   MCV 84.9  78.0 - 100.0 fL   MCH 26.8  26.0 - 34.0 pg   MCHC 31.6  30.0 - 36.0 g/dL   RDW 17.2 (*) 11.5 - 15.5 %   Platelets 202  150 - 400 K/uL  COMPREHENSIVE METABOLIC PANEL     Status: Abnormal   Collection Time    11/18/13  7:45 PM      Result Value Ref Range   Sodium 139  137 - 147 mEq/L   Potassium 4.8  3.7 - 5.3 mEq/L   Chloride 101  96 - 112 mEq/L   CO2 23  19 - 32 mEq/L   Glucose, Bld 279 (*) 70 - 99 mg/dL   BUN 23  6 - 23 mg/dL   Creatinine, Ser 1.11 (*) 0.50 - 1.10 mg/dL   Calcium 9.4  8.4 - 10.5 mg/dL   Total Protein 7.5  6.0 - 8.3 g/dL  Albumin 3.8  3.5 - 5.2 g/dL   AST 19  0 - 37 U/L   ALT 22  0 - 35 U/L   Alkaline Phosphatase 62  39 - 117 U/L   Total Bilirubin 0.3  0.3 - 1.2 mg/dL   GFR calc non Af Amer 46 (*) >90 mL/min   GFR calc Af Amer 53 (*) >90 mL/min   Comment: (NOTE)     The eGFR has been calculated using the CKD EPI equation.     This calculation has not been validated in all clinical situations.     eGFR's persistently <90 mL/min signify possible Chronic Kidney     Disease.   Anion gap 15  5 - 15  TYPE AND SCREEN     Status: None   Collection Time    11/18/13  7:56 PM      Result Value Ref Range   ABO/RH(D) O POS     Antibody Screen POS     Sample Expiration 11/21/2013     Antibody Identification ANTI K ANTI FYA (Duffy a)     DAT, IgG NEG     Unit Number [id number]     Blood Component Type RED CELLS,LR     Unit division 00     Status of Unit ISSUED,FINAL     Donor AG Type       Value: NEGATIVE FOR DUFFY A ANTIGEN NEGATIVE FOR KELL ANTIGEN   Transfusion Status OK TO TRANSFUSE     Crossmatch Result COMPATIBLE     Unit Number [id number]     Blood Component Type RED CELLS,LR     Unit division 00      Status of Unit ALLOCATED     Donor AG Type       Value: NEGATIVE FOR DUFFY A ANTIGEN NEGATIVE FOR KELL ANTIGEN   Transfusion Status OK TO TRANSFUSE     Crossmatch Result COMPATIBLE    HEMOGLOBIN AND HEMATOCRIT, BLOOD     Status: Abnormal   Collection Time    11/19/13 12:25 AM      Result Value Ref Range   Hemoglobin 9.7 (*) 12.0 - 15.0 g/dL   HCT 30.8 (*) 36.0 - 46.0 %  GLUCOSE, CAPILLARY     Status: Abnormal   Collection Time    11/19/13  5:24 AM      Result Value Ref Range   Glucose-Capillary 168 (*) 70 - 99 mg/dL  HEMOGLOBIN AND HEMATOCRIT, BLOOD     Status: Abnormal   Collection Time    11/19/13  5:35 AM      Result Value Ref Range   Hemoglobin 9.1 (*) 12.0 - 15.0 g/dL   HCT 28.7 (*) 36.0 - 46.0 %  PREPARE RBC (CROSSMATCH)     Status: None   Collection Time    11/19/13  7:52 AM      Result Value Ref Range   Order Confirmation ORDER PROCESSED BY BLOOD BANK    GLUCOSE, CAPILLARY     Status: Abnormal   Collection Time    11/19/13  7:54 AM      Result Value Ref Range   Glucose-Capillary 206 (*) 70 - 99 mg/dL   Comment 1 Documented in Chart     Comment 2 Notify RN    GLUCOSE, CAPILLARY     Status: Abnormal   Collection Time    11/19/13 11:35 AM      Result Value Ref Range   Glucose-Capillary 192 (*) 70 - 99 mg/dL  Comment 1 Documented in Chart     Comment 2 Notify RN    GLUCOSE, CAPILLARY     Status: Abnormal   Collection Time    11/19/13  3:57 PM      Result Value Ref Range   Glucose-Capillary 252 (*) 70 - 99 mg/dL   Comment 1 Documented in Chart     Comment 2 Notify RN    PROTIME-INR     Status: Abnormal   Collection Time    11/19/13  6:27 PM      Result Value Ref Range   Prothrombin Time 22.6 (*) 11.6 - 15.2 seconds   INR 1.99 (*) 0.00 - 1.49  CBC     Status: Abnormal   Collection Time    11/19/13  6:27 PM      Result Value Ref Range   WBC 8.3  4.0 - 10.5 K/uL   RBC 3.53 (*) 3.87 - 5.11 MIL/uL   Hemoglobin 9.6 (*) 12.0 - 15.0 g/dL   HCT 29.7 (*) 36.0 -  46.0 %   MCV 84.1  78.0 - 100.0 fL   MCH 27.2  26.0 - 34.0 pg   MCHC 32.3  30.0 - 36.0 g/dL   RDW 16.7 (*) 11.5 - 15.5 %   Platelets 181  150 - 400 K/uL  GLUCOSE, CAPILLARY     Status: Abnormal   Collection Time    11/19/13  8:28 PM      Result Value Ref Range   Glucose-Capillary 214 (*) 70 - 99 mg/dL  GLUCOSE, CAPILLARY     Status: Abnormal   Collection Time    11/20/13 12:44 AM      Result Value Ref Range   Glucose-Capillary 149 (*) 70 - 99 mg/dL  GLUCOSE, CAPILLARY     Status: Abnormal   Collection Time    11/20/13  5:09 AM      Result Value Ref Range   Glucose-Capillary 147 (*) 70 - 99 mg/dL  PROTIME-INR     Status: Abnormal   Collection Time    11/20/13  6:20 AM      Result Value Ref Range   Prothrombin Time 18.4 (*) 11.6 - 15.2 seconds   INR 1.53 (*) 0.00 - 1.49  CBC     Status: Abnormal   Collection Time    11/20/13  6:20 AM      Result Value Ref Range   WBC 7.5  4.0 - 10.5 K/uL   RBC 3.12 (*) 3.87 - 5.11 MIL/uL   Hemoglobin 8.5 (*) 12.0 - 15.0 g/dL   HCT 26.3 (*) 36.0 - 46.0 %   MCV 84.3  78.0 - 100.0 fL   MCH 27.2  26.0 - 34.0 pg   MCHC 32.3  30.0 - 36.0 g/dL   RDW 17.0 (*) 11.5 - 15.5 %   Platelets 157  150 - 400 K/uL  CLOSTRIDIUM DIFFICILE BY PCR     Status: None   Collection Time    11/20/13  8:02 AM      Result Value Ref Range   C difficile by pcr NEGATIVE  NEGATIVE  GLUCOSE, CAPILLARY     Status: Abnormal   Collection Time    11/20/13  8:13 AM      Result Value Ref Range   Glucose-Capillary 186 (*) 70 - 99 mg/dL    No results found.  ROS negative except above she denies any aspirin or nonsteroidals with her Coumadin Blood pressure 129/48, pulse 66, temperature 97.7 F (  36.5 C), temperature source Oral, resp. rate 18, height 5' 1"  (1.549 m), weight 86.728 kg (191 lb 3.2 oz), SpO2 98.00%. Physical Exam vital signs stable afebrile no acute distress exam pertinent for her abdomen being soft nontender labs reviewed  Assessment/Plan: Probable  diverticular bleeding in a patient on Coumadin but also with chronic diarrhea Plan: I offered them outpatient followup and holding off on a colonoscopy unless we wanted to proceed for recurrent bleeding or if she wanted a workup for her diarrhea and we also talked about possibly trying other medicines other than Imodium but after our prolonged conversation with the patient and her daughter including the risks benefits and methods of colonoscopy they prefer to proceed tomorrow with further workup and plans pending those findings and as an aside we last medical team to see if she can be treated with a lesser anticoagulant for her risk of stroke and a fib Evelyna Folker E 11/20/2013, 11:38 AM

## 2013-11-21 ENCOUNTER — Encounter (HOSPITAL_COMMUNITY)
Admission: EM | Disposition: A | Discharge status: Home / Self Care | Payer: Self-pay | Source: Home / Self Care | Attending: Internal Medicine

## 2013-11-21 ENCOUNTER — Encounter (HOSPITAL_COMMUNITY): Payer: Self-pay | Admitting: *Deleted

## 2013-11-21 HISTORY — PX: COLONOSCOPY: SHX5424

## 2013-11-21 LAB — CBC
HCT: 24.6 % — ABNORMAL LOW (ref 36.0–46.0)
Hemoglobin: 7.9 g/dL — ABNORMAL LOW (ref 12.0–15.0)
MCH: 26.7 pg (ref 26.0–34.0)
MCHC: 32.1 g/dL (ref 30.0–36.0)
MCV: 83.1 fL (ref 78.0–100.0)
Platelets: 164 10*3/uL (ref 150–400)
RBC: 2.96 MIL/uL — ABNORMAL LOW (ref 3.87–5.11)
RDW: 17.2 % — ABNORMAL HIGH (ref 11.5–15.5)
WBC: 6.3 10*3/uL (ref 4.0–10.5)

## 2013-11-21 LAB — GLUCOSE, CAPILLARY
Glucose-Capillary: 168 mg/dL — ABNORMAL HIGH (ref 70–99)
Glucose-Capillary: 189 mg/dL — ABNORMAL HIGH (ref 70–99)
Glucose-Capillary: 189 mg/dL — ABNORMAL HIGH (ref 70–99)
Glucose-Capillary: 192 mg/dL — ABNORMAL HIGH (ref 70–99)
Glucose-Capillary: 204 mg/dL — ABNORMAL HIGH (ref 70–99)
Glucose-Capillary: 214 mg/dL — ABNORMAL HIGH (ref 70–99)
Glucose-Capillary: 268 mg/dL — ABNORMAL HIGH (ref 70–99)

## 2013-11-21 LAB — BASIC METABOLIC PANEL WITH GFR
Anion gap: 10 (ref 5–15)
BUN: 9 mg/dL (ref 6–23)
CO2: 22 meq/L (ref 19–32)
Calcium: 8.2 mg/dL — ABNORMAL LOW (ref 8.4–10.5)
Chloride: 112 meq/L (ref 96–112)
Creatinine, Ser: 0.9 mg/dL (ref 0.50–1.10)
GFR calc Af Amer: 69 mL/min — ABNORMAL LOW
GFR calc non Af Amer: 59 mL/min — ABNORMAL LOW
Glucose, Bld: 186 mg/dL — ABNORMAL HIGH (ref 70–99)
Potassium: 4.3 meq/L (ref 3.7–5.3)
Sodium: 144 meq/L (ref 137–147)

## 2013-11-21 LAB — PROTIME-INR
INR: 1.38 (ref 0.00–1.49)
Prothrombin Time: 17 seconds — ABNORMAL HIGH (ref 11.6–15.2)

## 2013-11-21 LAB — HEMOGLOBIN AND HEMATOCRIT, BLOOD
HCT: 24.9 % — ABNORMAL LOW (ref 36.0–46.0)
Hemoglobin: 8.2 g/dL — ABNORMAL LOW (ref 12.0–15.0)

## 2013-11-21 SURGERY — COLONOSCOPY
Anesthesia: Moderate Sedation

## 2013-11-21 MED ORDER — FENTANYL CITRATE 0.05 MG/ML IJ SOLN
INTRAMUSCULAR | Status: AC
  Filled 2013-11-21: qty 2

## 2013-11-21 MED ORDER — FENTANYL CITRATE 0.05 MG/ML IJ SOLN
INTRAMUSCULAR | Status: DC | PRN
  Administered 2013-11-21 (×2): 25 ug via INTRAVENOUS

## 2013-11-21 MED ORDER — MIDAZOLAM HCL 5 MG/ML IJ SOLN
INTRAMUSCULAR | Status: AC
  Filled 2013-11-21: qty 2

## 2013-11-21 MED ORDER — SODIUM CHLORIDE 0.9 % IV SOLN
INTRAVENOUS | Status: DC
  Administered 2013-11-21: 09:00:00 via INTRAVENOUS

## 2013-11-21 MED ORDER — INSULIN ASPART 100 UNIT/ML ~~LOC~~ SOLN
0.0000 [IU] | Freq: Three times a day (TID) | SUBCUTANEOUS | Status: DC
  Administered 2013-11-22 (×2): 5 [IU] via SUBCUTANEOUS

## 2013-11-21 MED ORDER — MIDAZOLAM HCL 5 MG/5ML IJ SOLN
INTRAMUSCULAR | Status: DC | PRN
  Administered 2013-11-21: 2 mg via INTRAVENOUS
  Administered 2013-11-21: 1 mg via INTRAVENOUS

## 2013-11-21 NOTE — Progress Notes (Signed)
Inpatient Diabetes Program Recommendations  AACE/ADA: New Consensus Statement on Inpatient Glycemic Control (2013)  Target Ranges:  Prepandial:   less than 140 mg/dL      Peak postprandial:   less than 180 mg/dL (1-2 hours)      Critically ill patients:  140 - 180 mg/dL     Results for Tiffany [name], Tiffany [name] (MRN [id number]) as of 11/21/2013 09:25  Ref. Range 11/20/2013 00:44 11/20/2013 05:09 11/20/2013 08:13 11/20/2013 11:42 11/20/2013 16:25 11/20/2013 20:41  Glucose-Capillary Latest Range: 70-99 mg/dL 149 (H) 147 (H) 186 (H) 251 (H) 235 (H) 192 (H)    Results for Tiffany [name], Tiffany [name] (MRN [id number]) as of 11/21/2013 09:25  Ref. Range 11/21/2013 00:13 11/21/2013 04:46 11/21/2013 07:45  Glucose-Capillary Latest Range: 70-99 mg/dL 214 (H) 204 (H) 168 (H)     MD- Please consider the following insulin adjustments:  Increase SSI to Novolog Moderate SSI Q4 hours (currently ordered as Sensitive SSI Q4 hours)     Will follow Wyn Quaker RN, MSN, CDE Diabetes Coordinator Inpatient Diabetes Program Team Pager: [phone] (8a-10p)

## 2013-11-21 NOTE — Op Note (Signed)
[facility] [place], [address]   COLONOSCOPY PROCEDURE REPORT     EXAM DATE: 11/21/2013  PATIENT NAME:      Tiffany [name], Tiffany [name]           MR #: [id number] BIRTHDATE:       06/03/1931      VISIT #:     [id number]  ATTENDING:     Clarene Essex, MD     STATUS:     outpatient REFERRING MD: ASA CLASS:        Class II  INDICATIONS:  The patient is a 80 yr old female here for a colonoscopy due to hematochezia and chronic diarrhea. PROCEDURE PERFORMED:     Colonoscopy with biopsy MEDICATIONS:     Fentanyl 50 mcg IV and Versed 3 mg IV ESTIMATED BLOOD LOSS:     None  CONSENT: The patient understands the risks and benefits of the procedure and understands that these risks include, but are not limited to: sedation, allergic reaction, infection, perforation and/or bleeding. Alternative means of evaluation and treatment include, among others: physical exam, x-rays, and/or surgical intervention. The patient elects to proceed with this endoscopic procedure.  DESCRIPTION OF PROCEDURE: During intra-op preparation period all mechanical & medical equipment was checked for proper function. Hand hygiene and appropriate measures for infection prevention was taken. After the risks, benefits and alternatives of the procedure were thoroughly explained, Informed consent was verified, confirmed and timeout was successfully executed by the treatment team. A digital exam revealed no abnormalities of the rectum.      The Pentax Ped Colon [id number] endoscope was introduced through the anus and advanced to the terminal ileum which was intubated for a short distance. this did require abdominal pressure but no position changesThe prep was adequate.. The instrument was then slowly withdrawn as the colon was fully examined. no signs of bleeding were seen and the patient tolerated the procedure well and the findings are recorded below      Retroflexed views  revealed no abnormalities.  The scope was then completely withdrawn from the patient and the procedure terminated. WITHDRAWAL TIME:    ADVERSE EVENTS:      There were no complications. IMPRESSIONS:     1. Small external hemorrhoid 2. Sigmoid and distal descending multiple diverticuli without any blood 3. Small distal sigmoid polyp cold biopsied 4 otherwise within normal limits to the terminal ileum with random colon biopsies to rule out microscopic colitis  RECOMMENDATIONS:     await pathology happy to see back when necessary otherwise will advance diet and asked medical team to reevaluate blood thinners needs and okay with me to resume in a few days if no delayed complication or recurrent bleed although would expect a little bit of bright red blood from my biopsies today  RECALL:     as needed pending pathology  Clarene Essex, MD eSigned:  Clarene Essex, MD 11/21/2013 11:10 AM   cc:     PATIENT NAME:  Tiffany [name], Tiffany [name] MR#: [id number]

## 2013-11-21 NOTE — Progress Notes (Signed)
Tiffany [name] 10:00 AM  Subjective: Patient doing well without signs of bleeding and no new complaints  Objective: Vital signs stable afebrile no acute distress exam please see pre-assessment evaluation labs stable  Assessment: Diverticular bleed and chronic diarrhea  Plan: Okay to proceed with colonoscopy today with further workup and plans pending those findings  [facility] E

## 2013-11-21 NOTE — Progress Notes (Signed)
[facility] PROGRESS NOTE  Tiffany [name] [id number] DOB: [birth year] DOA: 11/18/2013 PCP: Marlou Sa ERIC, MD  Assessment/Plan: #1 hematochezia Likely secondary to diverticular bleed. Patient has had 2 colonoscopies done per GI in 2001 and in 2006 which were positive for diverticula. Patient with clinical improvement with less bloody bowel movements a more brown stool. Hemoglobin currently at 7.9 from 11.4 on admission. Patient status post 1 unit packed red blood cells. Continue serial CBCs. Patient status post colonoscopy today which showed small external hemorrhoids, sigmoid and distal descending multiple diverticula without any blood, small distal sigmoid polyp cold biopsied otherwise within normal limits the terminal ileum with random colon biopsies to rule out microscopic colitis. Pathology is pending. Diet has been advanced per gastroenterology. Per GI anticoagulation may be root resumed in the next 3-4 days.   #2 acute blood loss anemia Secondary to problem #1. Patient is status post 1 unit packed red blood cells. Hemoglobin currently at 7.9. Repeat H&H. Follow.  #3 history of atrial fibrillation Currently rate controlled. Diltiazem and metoprolol on hold. Coumadin on hold secondary to problem #1. Discussed anticoagulation with cardiology who feel that patient's risk for a CVA on significantly high based on the chest 24 and recommend continuing Coumadin at this time. Patient is status post colonoscopy and per GI anticoagulation may be resumed in the next 3-4 days.  #4 warfarin-induced coagulopathy Patient status post vitamin K. INR currently a 1.38. Follow.  #5 diabetes mellitus Hemoglobin A1c = 7.6. CBGs have ranged from 168-268. Will change sliding scale insulin to moderate scale.   #6 hypothyroidism Continued on home dose Synthroid.  #7 hypertension Stable. BP meds on hold.  #8 hyperlipidemia Resumed statin.  #9 prophylaxis SCDs for DVT prophylaxis.  Code  Status: Full Family Communication: Updated patient at bedside. Disposition Plan: Home tomorrow.   Consultants:  Gastroenterology: Dr. Watt Climes 11/19/2013  Procedures:  1 unit packed red blood cells 11/19/2013  Colonoscopy 11/21/2013 per Dr. Watt Climes  Antibiotics:  None  HPI/Subjective: Per nursing the patient brown stools. Patient is status post colonoscopy. No complaints.  Objective: Filed Vitals:   11/21/13 1432  BP: 115/45  Pulse:   Temp:   Resp:     Intake/Output Summary (Last 24 hours) at 11/21/13 1726 Last data filed at 11/21/13 1203  Gross per 24 hour  Intake    443 ml  Output      0 ml  Net    443 ml   Filed Weights   11/18/13 2342 11/20/13 0500 11/21/13 0500  Weight: 87.635 kg (193 lb 3.2 oz) 86.728 kg (191 lb 3.2 oz) 87.181 kg (192 lb 3.2 oz)    Exam:   General:  NAD  Cardiovascular: RRR with a grade 3/6 systolic ejection murmur  Respiratory: Clear to auscultation bilaterally  Abdomen: Soft, nontender, nondistended, positive bowel sounds  Musculoskeletal: No clubbing cyanosis or edema  Data Reviewed: Basic Metabolic Panel:  Recent Labs Lab 11/18/13 1945 11/21/13 0805  NA 139 144  K 4.8 4.3  CL 101 112  CO2 23 22  GLUCOSE 279* 186*  BUN 23 9  CREATININE 1.11* 0.90  CALCIUM 9.4 8.2*   Liver Function Tests:  Recent Labs Lab 11/18/13 1945  AST 19  ALT 22  ALKPHOS 62  BILITOT 0.3  PROT 7.5  ALBUMIN 3.8   No results found for this basename: LIPASE, AMYLASE,  in the last 168 hours No results found for this basename: AMMONIA,  in the last 168 hours CBC:  Recent Labs Lab  11/18/13 1945  11/19/13 0535 11/19/13 1827 11/20/13 0620 11/20/13 1515 11/21/13 0805  WBC 7.1  --   --  8.3 7.5 7.3 6.3  HGB 11.4*  < > 9.1* 9.6* 8.5* 8.3* 7.9*  HCT 36.1  < > 28.7* 29.7* 26.3* 25.4* 24.6*  MCV 84.9  --   --  84.1 84.3 81.9 83.1  PLT 202  --   --  181 157 170 164  < > = values in this interval not displayed. Cardiac Enzymes: No results  found for this basename: CKTOTAL, CKMB, CKMBINDEX, TROPONINI,  in the last 168 hours BNP (last 3 results) No results found for this basename: PROBNP,  in the last 8760 hours CBG:  Recent Labs Lab 11/21/13 0013 11/21/13 0446 11/21/13 0745 11/21/13 1209 11/21/13 1659  GLUCAP 214* 204* 168* 189* 268*    Recent Results (from the past 240 hour(s))  CLOSTRIDIUM DIFFICILE BY PCR     Status: None   Collection Time    11/20/13  8:02 AM      Result Value Ref Range Status   C difficile by pcr NEGATIVE  NEGATIVE Final     Studies: No results found.  Scheduled Meds: . atorvastatin  20 mg Oral q1800  . insulin aspart  0-9 Units Subcutaneous 6 times per day  . levothyroxine  25 mcg Oral QAC breakfast  . sodium chloride  3 mL Intravenous Q12H   Continuous Infusions: . sodium chloride 75 mL/hr at 11/21/13 1205    Principal Problem:   Hematochezia Active Problems:   Acute blood loss anemia   DIABETES MELLITUS, TYPE II   HYPERTENSION   CARDIOMYOPATHY   Atrial fibrillation   Pacemaker-Medtronic   Warfarin-induced coagulopathy   Unspecified hypothyroidism    Time spent: 94 minutes    [facility] MD [facility] Pager [phone]. If 7PM-7AM, please contact night-coverage at [email or web address], password [facility] 11/21/2013, 5:26 PM  LOS: 3 days

## 2013-11-22 DIAGNOSIS — N390 Urinary tract infection, site not specified: Secondary | ICD-10-CM

## 2013-11-22 DIAGNOSIS — K5731 Diverticulosis of large intestine without perforation or abscess with bleeding: Principal | ICD-10-CM

## 2013-11-22 LAB — TYPE AND SCREEN
ABO/RH(D): O POS
Antibody Screen: POSITIVE
DAT, IgG: NEGATIVE
Donor AG Type: NEGATIVE
Donor AG Type: NEGATIVE
Unit division: 0
Unit division: 0

## 2013-11-22 LAB — CBC
HCT: 24.4 % — ABNORMAL LOW (ref 36.0–46.0)
Hemoglobin: 7.8 g/dL — ABNORMAL LOW (ref 12.0–15.0)
MCH: 27.7 pg (ref 26.0–34.0)
MCHC: 32 g/dL (ref 30.0–36.0)
MCV: 86.5 fL (ref 78.0–100.0)
Platelets: 163 10*3/uL (ref 150–400)
RBC: 2.82 MIL/uL — ABNORMAL LOW (ref 3.87–5.11)
RDW: 17.5 % — ABNORMAL HIGH (ref 11.5–15.5)
WBC: 7.2 10*3/uL (ref 4.0–10.5)

## 2013-11-22 LAB — GLUCOSE, CAPILLARY
Glucose-Capillary: 199 mg/dL — ABNORMAL HIGH (ref 70–99)
Glucose-Capillary: 222 mg/dL — ABNORMAL HIGH (ref 70–99)
Glucose-Capillary: 228 mg/dL — ABNORMAL HIGH (ref 70–99)
Glucose-Capillary: 228 mg/dL — ABNORMAL HIGH (ref 70–99)

## 2013-11-22 LAB — URINALYSIS, ROUTINE W REFLEX MICROSCOPIC
Bilirubin Urine: NEGATIVE
Glucose, UA: NEGATIVE mg/dL
Ketones, ur: NEGATIVE mg/dL
Nitrite: POSITIVE — AB
Protein, ur: 100 mg/dL — AB
Specific Gravity, Urine: 1.01 (ref 1.005–1.030)
Urobilinogen, UA: 0.2 mg/dL (ref 0.0–1.0)
pH: 5 (ref 5.0–8.0)

## 2013-11-22 LAB — URINE MICROSCOPIC-ADD ON

## 2013-11-22 MED ORDER — WARFARIN SODIUM 2.5 MG PO TABS: 1.2500 mg | ORAL_TABLET | Freq: Every day | ORAL | Status: DC

## 2013-11-22 MED ORDER — CEPHALEXIN 500 MG PO CAPS
500.0000 mg | ORAL_CAPSULE | Freq: Two times a day (BID) | ORAL | Status: DC
  Administered 2013-11-22: 500 mg via ORAL
  Filled 2013-11-22 (×2): qty 1

## 2013-11-22 MED ORDER — CEPHALEXIN 500 MG PO CAPS: 500.0000 mg | ORAL_CAPSULE | Freq: Two times a day (BID) | ORAL | Status: AC

## 2013-11-22 NOTE — ED Provider Notes (Signed)
I saw and evaluated the patient, reviewed the resident's note and I agree with the findings and plan.  Pt c/o brbpr.  No fever, no faintness or dizziness. abd soft nt. Labs.    Mirna Mires, MD 11/22/13 2109

## 2013-11-22 NOTE — Discharge Summary (Signed)
Physician Discharge Summary  Tiffany [name] [id number] DOB: 02-Dec-1931 DOA: 11/18/2013  PCP: Kevan Ny, MD  Admit date: 11/18/2013 Discharge date: 11/22/2013  Time spent: 65 minutes  Recommendations for Outpatient Follow-up:  1. Follow up with Marlou Sa, ERIC, MD in 1 week. On followup CBC needs to be checked to follow up on H/H. Urine cultures and sensitivities, will need to be followed up upon on followup with her PCP. Patient will be discharged on a course of Keflex. 2. Follow up at Kevan Ny, MD office for PT/INR check on Wednesday, 11/26/2013  Discharge Diagnoses:  Principal Problem:   Hematochezia Active Problems:   Acute blood loss anemia   DIABETES MELLITUS, TYPE II   HYPERTENSION   CARDIOMYOPATHY   Atrial fibrillation   Pacemaker-[employer]   Warfarin-induced coagulopathy   Unspecified hypothyroidism   UTI (urinary tract infection)   Discharge Condition: stable and improved  Diet recommendation: Heart healthy  Filed Weights   11/18/13 2342 11/20/13 0500 11/21/13 0500  Weight: 87.635 kg (193 lb 3.2 oz) 86.728 kg (191 lb 3.2 oz) 87.181 kg (192 lb 3.2 oz)    History of present illness:  80 yo female who presented to the ED with painless rectal bleeding bright red blood with clots started on the day of admission with multiple episodes. Patient denied any nausea, no vomiting no abdominal pain no prior history of GI bleed.  No fevers. No recent illness. She was on coumadin for afib. Patient was subsequently admitted for further evaluation and management.   Hospital Course:  #1 hematochezia  Patient had presented with bright red blood per rectum which was started on the day of admission. Patient with no prior history of GI bleed. Patient with a history of diverticulosis. Patient was admitted to the telemetry floor, monitored and placed on gentle IV fluids and supportive care. Patient's Coumadin was held. It was felt patient's GI bleed was likely secondary to  diverticular bleed. Patient has had 2 colonoscopies done per GI in 2001 and in 2006 which were positive for diverticula. Patient improved clinically on a daily basis with improvement in her bloody bowel movements. Patient's hemoglobin trickled down from 11.1 admission to 7.9. Patient was transfused 1 unit packed red blood cells. Patient underwent colonoscopy 11/21/13, which showed small external hemorrhoids, sigmoid and distal descending multiple diverticula without any blood, small distal sigmoid polyp cold biopsied otherwise within normal limits the terminal ileum with random colon biopsies to rule out microscopic colitis. Pathology is pending. Patient's bleeding improved and had resolved by day of discharge. Patient's diet was advanced which she tolerated. Patient's Coumadin will be resumed on Monday, 11/24/2013.  #2 acute blood loss anemia  Secondary to problem #1. Patient was transfused 1 unit packed red blood cells. Hemoglobin currently at 7.8 and stable. Outpatient follow up. #3 history of atrial fibrillation  Currently rate controlled. Diltiazem and metoprolol on hold. Coumadin on hold secondary to problem #1. Discussed anticoagulation with cardiology who feel that patient's risk for a CVA on significantly high based on the CHADSS score and recommend continuing Coumadin at this time. Patient is status post colonoscopy and per GI anticoagulation may be resumed next few days. Patient's Coumadin will be resumed on discharge on Monday, 11/24/2013. #4 warfarin-induced coagulopathy  Patient status post vitamin K. INR currently a 1.38. Follow.  #5 diabetes mellitus  Hemoglobin A1c = 7.6. CBGs have ranged from 168-268. Patient's oral hypoglycemic agents were held during the hospitalization and the patient was placed on a sliding scale insulin.  #  6 hypothyroidism  Continued on home dose Synthroid.  #7 hypertension  Stable. BP meds on hold, secondary to borderline BP and GIB. Patient improved clinically did  not have any further leading and antihypertensive medications will be resumed on discharge. #8 hyperlipidemia  Resumed home regimen of statin.  #9 urinary tract infection On day of discharge patient was complaining of some dysuria. Urinalysis which was obtained was nitrite positive moderate leukocytes too numerous to count WBCs. Urine cultures are pending at the time of discharge. Patient will be discharged home on a seven-day course of Keflex 500 twice a day.      Procedures: 1 unit packed red blood cells 11/19/2013  Colonoscopy 11/21/2013 per Dr. Watt Climes   Consultations: Gastroenterology: Dr. Watt Climes 11/19/2013   Discharge Exam: Filed Vitals:   11/22/13 1024  BP: 126/45  Pulse: 62  Temp: 97.7 F (36.5 C)  Resp: 18    General: NAD Cardiovascular: RRR Respiratory: CTAB  Discharge Instructions You were cared for by a hospitalist during your hospital stay. If you have any questions about your discharge medications or the care you received while you were in the hospital after you are discharged, you can call the unit and asked to speak with the hospitalist on call if the hospitalist that took care of you is not available. Once you are discharged, your primary care physician will handle any further medical issues. Please note that NO REFILLS for any discharge medications will be authorized once you are discharged, as it is imperative that you return to your primary care physician (or establish a relationship with a primary care physician if you do not have one) for your aftercare needs so that they can reassess your need for medications and monitor your lab values.  Discharge Instructions   Diet - low sodium heart healthy    Complete by:  As directed      Discharge instructions    Complete by:  As directed   Follow up with Marlou Sa, ERIC, MD in 1 week Follow up at PCP office on Wednesday for PT/INR (coumadin check)     Increase activity slowly    Complete by:  As directed            Current Discharge Medication List    START taking these medications   Details  cephALEXin (KEFLEX) 500 MG capsule Take 1 capsule (500 mg total) by mouth 2 (two) times daily. Take for 7 days then stop. Qty: 14 capsule, Refills: 0      CONTINUE these medications which have CHANGED   Details  warfarin (COUMADIN) 2.5 MG tablet Take 0.5-1 tablets (1.25-2.5 mg total) by mouth daily. Resume on Monday 11/24/13.  2.5 mg daily except 1.25 mg on Wednesday      CONTINUE these medications which have NOT CHANGED   Details  alclomethasone (ACLOVATE) 0.05 % cream Apply 1 application topically 2 (two) times daily as needed (rash).    diltiazem (DILACOR XR) 180 MG 24 hr capsule Take 180 mg by mouth daily.    furosemide (LASIX) 20 MG tablet Take 20 mg by mouth daily.    glipiZIDE (GLUCOTROL) 10 MG tablet Take 10 mg by mouth 2 (two) times daily before a meal.    levothyroxine (SYNTHROID, LEVOTHROID) 25 MCG tablet Take 25 mcg by mouth daily before breakfast.    metFORMIN (GLUCOPHAGE) 1000 MG tablet Take 1,000 mg by mouth 2 (two) times daily with a meal.    metoprolol tartrate (LOPRESSOR) 25 MG tablet Take 25 mg by  mouth 2 (two) times daily.    mometasone-formoterol (DULERA) 200-5 MCG/ACT AERO Inhale 2 puffs into the lungs daily as needed for wheezing or shortness of breath.    potassium chloride SA (K-DUR,KLOR-CON) 20 MEQ tablet Take 20 mEq by mouth daily.    ranitidine (ZANTAC) 150 MG capsule Take 150 mg by mouth 2 (two) times daily.      rosuvastatin (CRESTOR) 10 MG tablet Take 10 mg by mouth daily.       No Known Allergies Follow-up Information   Follow up with Marlou Sa, ERIC, MD. Schedule an appointment as soon as possible for a visit in 1 week.   Specialty:  Internal Medicine   Contact information:   [facility] [place] [address] [phone]       Follow up with Kevan Ny, MD On 11/26/2013. (f/u on wednesday 11/26/13 for PT/INR check at PCP  office.)    Specialty:  Internal Medicine   Contact information:   [facility] [place] [address] [phone]        The results of significant diagnostics from this hospitalization (including imaging, microbiology, ancillary and laboratory) are listed below for reference.    Significant Diagnostic Studies: No results found.  Microbiology: Recent Results (from the past 240 hour(s))  CLOSTRIDIUM DIFFICILE BY PCR     Status: None   Collection Time    11/20/13  8:02 AM      Result Value Ref Range Status   C difficile by pcr NEGATIVE  NEGATIVE Final     Labs: Basic Metabolic Panel:  Recent Labs Lab 11/18/13 1945 11/21/13 0805  NA 139 144  K 4.8 4.3  CL 101 112  CO2 23 22  GLUCOSE 279* 186*  BUN 23 9  CREATININE 1.11* 0.90  CALCIUM 9.4 8.2*   Liver Function Tests:  Recent Labs Lab 11/18/13 1945  AST 19  ALT 22  ALKPHOS 62  BILITOT 0.3  PROT 7.5  ALBUMIN 3.8   No results found for this basename: LIPASE, AMYLASE,  in the last 168 hours No results found for this basename: AMMONIA,  in the last 168 hours CBC:  Recent Labs Lab 11/19/13 1827 11/20/13 0620 11/20/13 1515 11/21/13 0805 11/21/13 1837 11/22/13 0535  WBC 8.3 7.5 7.3 6.3  --  7.2  HGB 9.6* 8.5* 8.3* 7.9* 8.2* 7.8*  HCT 29.7* 26.3* 25.4* 24.6* 24.9* 24.4*  MCV 84.1 84.3 81.9 83.1  --  86.5  PLT 181 157 170 164  --  163   Cardiac Enzymes: No results found for this basename: CKTOTAL, CKMB, CKMBINDEX, TROPONINI,  in the last 168 hours BNP: BNP (last 3 results) No results found for this basename: PROBNP,  in the last 8760 hours CBG:  Recent Labs Lab 11/21/13 2051 11/22/13 0002 11/22/13 0519 11/22/13 0726 11/22/13 1150  GLUCAP 189* 199* 222* 228* 228*       Signed:  Coleton Woon MD [facility] 11/22/2013, 3:02 PM

## 2013-11-22 NOTE — Progress Notes (Signed)
Patient discharged to go home.Discharge instructions given and patient and family verbalized understanding

## 2013-11-22 NOTE — Progress Notes (Signed)
Subjective: Diarrhea and blood in stool has resolved. Tolerating diet.  Objective: Vital signs in last 24 hours: Temp:  [97.7 F (36.5 C)-98.2 F (36.8 C)] 97.7 F (36.5 C) (09/26 1024) Pulse Rate:  [59-63] 62 (09/26 1024) Resp:  [16-18] 18 (09/26 1024) BP: (110-126)/(35-53) 126/45 mmHg (09/26 1024) SpO2:  [96 %-100 %] 99 % (09/26 1024) Weight change:  Last BM Date: 11/21/13  PE: GEN:  NAD ABD:  Soft  Lab Results: CBC    Component Value Date/Time   WBC 7.2 11/22/2013 0535   RBC 2.82* 11/22/2013 0535   HGB 7.8* 11/22/2013 0535   HCT 24.4* 11/22/2013 0535   PLT 163 11/22/2013 0535   MCV 86.5 11/22/2013 0535   MCH 27.7 11/22/2013 0535   MCHC 32.0 11/22/2013 0535   RDW 17.5* 11/22/2013 0535   LYMPHSABS 1.3 09/27/2012 1338   MONOABS 0.5 09/27/2012 1338   EOSABS 0.4 09/27/2012 1338   BASOSABS 0.0 09/27/2012 1338   CMP     Component Value Date/Time   NA 144 11/21/2013 0805   K 4.3 11/21/2013 0805   CL 112 11/21/2013 0805   CO2 22 11/21/2013 0805   GLUCOSE 186* 11/21/2013 0805   BUN 9 11/21/2013 0805   CREATININE 0.90 11/21/2013 0805   CREATININE 0.93 10/21/2011 1044   CALCIUM 8.2* 11/21/2013 0805   PROT 7.5 11/18/2013 1945   ALBUMIN 3.8 11/18/2013 1945   AST 19 11/18/2013 1945   ALT 22 11/18/2013 1945   ALKPHOS 62 11/18/2013 1945   BILITOT 0.3 11/18/2013 1945   GFRNONAA 59* 11/21/2013 0805   GFRAA 69* 11/21/2013 0805   Assessment:  1.  Diarrhea.  Unclear etiology.  Perhaps microscopic colitis, biopsies done during yesterday's colonoscopy. 2.  Blood in stool.  Possibly diverticular.  Plan:  1.  Awaiting biopsies. 2.  Advance diet. 3.  Ok to discharge home today from GI perspective, Dr. Perley Jain office will call patient next week with biopsy results, and will decide on appropriate follow-up plan at that point. 4.  Will sign-off; please call with questions.   Landry Dyke 11/22/2013, 11:14 AM

## 2013-11-24 ENCOUNTER — Encounter (HOSPITAL_COMMUNITY): Payer: Self-pay | Admitting: Gastroenterology

## 2013-11-25 LAB — URINE CULTURE
Colony Count: >=100000
Colony Count: >=100000

## 2014-01-02 ENCOUNTER — Other Ambulatory Visit (HOSPITAL_COMMUNITY): Payer: Self-pay | Admitting: Primary Care

## 2014-01-02 ENCOUNTER — Ambulatory Visit (HOSPITAL_COMMUNITY)
Admission: RE | Admit: 2014-01-02 | Discharge: 2014-01-02 | Disposition: A | Discharge status: Home / Self Care | Payer: Medicare Other | Source: Ambulatory Visit | Attending: Primary Care | Admitting: Primary Care

## 2014-01-02 DIAGNOSIS — R05 Cough: Secondary | ICD-10-CM

## 2014-01-02 DIAGNOSIS — R0602 Shortness of breath: Secondary | ICD-10-CM | POA: Diagnosis not present

## 2014-01-02 DIAGNOSIS — R053 Chronic cough: Secondary | ICD-10-CM

## 2014-02-05 ENCOUNTER — Encounter (HOSPITAL_COMMUNITY): Payer: Self-pay | Admitting: Internal Medicine

## 2014-02-09 ENCOUNTER — Ambulatory Visit (INDEPENDENT_AMBULATORY_CARE_PROVIDER_SITE_OTHER): Payer: Medicare Other | Admitting: *Deleted

## 2014-02-09 DIAGNOSIS — I495 Sick sinus syndrome: Secondary | ICD-10-CM

## 2014-02-09 LAB — MDC_IDC_ENUM_SESS_TYPE_REMOTE
Battery Remaining Longevity: 64 mo
Battery Voltage: 2.99 V
Brady Statistic AP VP Percent: 100 %
Brady Statistic AP VS Percent: 0 %
Brady Statistic AS VP Percent: 0 %
Brady Statistic AS VS Percent: 0 %
Brady Statistic RA Percent Paced: 100 %
Brady Statistic RV Percent Paced: 100 %
Date Time Interrogation Session: 20151214172418
Lead Channel Impedance Value: 247 Ohm
Lead Channel Impedance Value: 266 Ohm
Lead Channel Impedance Value: 304 Ohm
Lead Channel Impedance Value: 304 Ohm
Lead Channel Pacing Threshold Amplitude: 1 V
Lead Channel Pacing Threshold Amplitude: 1.25 V
Lead Channel Pacing Threshold Pulse Width: 0.4 ms
Lead Channel Pacing Threshold Pulse Width: 0.4 ms
Lead Channel Sensing Intrinsic Amplitude: 0.25 mV
Lead Channel Sensing Intrinsic Amplitude: 0.25 mV
Lead Channel Sensing Intrinsic Amplitude: 10.25 mV
Lead Channel Sensing Intrinsic Amplitude: 10.25 mV
Lead Channel Setting Pacing Amplitude: 2.5 V
Lead Channel Setting Pacing Amplitude: 2.5 V
Lead Channel Setting Pacing Pulse Width: 0.4 ms
Lead Channel Setting Sensing Sensitivity: 8 mV
Zone Setting Detection Interval: 400 ms
Zone Setting Detection Interval: 430 ms

## 2014-02-09 NOTE — Progress Notes (Signed)
Remote pacemaker transmission.   

## 2014-02-13 ENCOUNTER — Encounter: Payer: Self-pay | Admitting: Cardiology

## 2014-03-05 ENCOUNTER — Encounter: Payer: Self-pay | Admitting: Cardiology

## 2014-03-09 ENCOUNTER — Encounter: Payer: Self-pay | Admitting: Internal Medicine

## 2014-05-11 ENCOUNTER — Telehealth: Payer: Self-pay | Admitting: Internal Medicine

## 2014-05-11 NOTE — Telephone Encounter (Signed)
error 

## 2014-05-13 ENCOUNTER — Ambulatory Visit (INDEPENDENT_AMBULATORY_CARE_PROVIDER_SITE_OTHER): Payer: Medicare Other | Admitting: *Deleted

## 2014-05-13 DIAGNOSIS — I495 Sick sinus syndrome: Secondary | ICD-10-CM

## 2014-05-13 NOTE — Progress Notes (Signed)
Remote pacemaker transmission.   

## 2014-05-14 LAB — MDC_IDC_ENUM_SESS_TYPE_REMOTE
Battery Remaining Longevity: 62 mo
Battery Voltage: 2.99 V
Brady Statistic AP VP Percent: 100 %
Brady Statistic AP VS Percent: 0 %
Brady Statistic AS VP Percent: 0 %
Brady Statistic AS VS Percent: 0 %
Brady Statistic RA Percent Paced: 100 %
Brady Statistic RV Percent Paced: 100 %
Date Time Interrogation Session: 20160316133701
Lead Channel Impedance Value: 266 Ohm
Lead Channel Impedance Value: 266 Ohm
Lead Channel Impedance Value: 323 Ohm
Lead Channel Impedance Value: 323 Ohm
Lead Channel Pacing Threshold Amplitude: 1 V
Lead Channel Pacing Threshold Amplitude: 1.125 V
Lead Channel Pacing Threshold Pulse Width: 0.4 ms
Lead Channel Pacing Threshold Pulse Width: 0.4 ms
Lead Channel Sensing Intrinsic Amplitude: 0.25 mV
Lead Channel Sensing Intrinsic Amplitude: 0.3 mV
Lead Channel Sensing Intrinsic Amplitude: 10.25 mV
Lead Channel Sensing Intrinsic Amplitude: 10.25 mV
Lead Channel Setting Pacing Amplitude: 2.5 V
Lead Channel Setting Pacing Amplitude: 2.5 V
Lead Channel Setting Pacing Pulse Width: 0.4 ms
Lead Channel Setting Sensing Sensitivity: 8 mV
Zone Setting Detection Interval: 400 ms
Zone Setting Detection Interval: 430 ms

## 2014-05-19 ENCOUNTER — Encounter: Payer: Self-pay | Admitting: *Deleted

## 2014-05-26 ENCOUNTER — Encounter: Payer: Self-pay | Admitting: Internal Medicine

## 2014-08-12 ENCOUNTER — Encounter: Payer: Medicare Other | Admitting: *Deleted

## 2014-08-19 ENCOUNTER — Telehealth: Payer: Self-pay | Admitting: Internal Medicine

## 2014-08-19 ENCOUNTER — Ambulatory Visit (INDEPENDENT_AMBULATORY_CARE_PROVIDER_SITE_OTHER): Payer: Medicare Other | Admitting: *Deleted

## 2014-08-19 DIAGNOSIS — I495 Sick sinus syndrome: Secondary | ICD-10-CM | POA: Diagnosis not present

## 2014-08-19 NOTE — Progress Notes (Signed)
Remote pacemaker transmission.   

## 2014-08-19 NOTE — Telephone Encounter (Signed)
Informed pt that transmission was received.  

## 2014-08-19 NOTE — Telephone Encounter (Signed)
New message      Did you get her remote transmission?

## 2014-08-23 LAB — CUP PACEART REMOTE DEVICE CHECK
Battery Remaining Longevity: 60 mo
Battery Voltage: 2.99 V
Brady Statistic AP VP Percent: 100 %
Brady Statistic AP VS Percent: 0 %
Brady Statistic AS VP Percent: 0 %
Brady Statistic AS VS Percent: 0 %
Brady Statistic RA Percent Paced: 100 %
Brady Statistic RV Percent Paced: 100 %
Date Time Interrogation Session: 20160622152218
Lead Channel Impedance Value: 266 Ohm
Lead Channel Impedance Value: 266 Ohm
Lead Channel Impedance Value: 323 Ohm
Lead Channel Impedance Value: 323 Ohm
Lead Channel Pacing Threshold Amplitude: 1 V
Lead Channel Pacing Threshold Amplitude: 1 V
Lead Channel Pacing Threshold Pulse Width: 0.4 ms
Lead Channel Pacing Threshold Pulse Width: 0.4 ms
Lead Channel Sensing Intrinsic Amplitude: 0.25 mV
Lead Channel Sensing Intrinsic Amplitude: 0.25 mV
Lead Channel Sensing Intrinsic Amplitude: 10.25 mV
Lead Channel Sensing Intrinsic Amplitude: 10.25 mV
Lead Channel Setting Pacing Amplitude: 2.5 V
Lead Channel Setting Pacing Amplitude: 2.5 V
Lead Channel Setting Pacing Pulse Width: 0.4 ms
Lead Channel Setting Sensing Sensitivity: 8 mV
Zone Setting Detection Interval: 400 ms
Zone Setting Detection Interval: 430 ms

## 2014-08-26 ENCOUNTER — Encounter: Payer: Self-pay | Admitting: Cardiology

## 2014-09-07 ENCOUNTER — Encounter: Payer: Self-pay | Admitting: Internal Medicine

## 2014-11-16 ENCOUNTER — Encounter: Payer: Medicare Other | Admitting: Internal Medicine

## 2014-11-18 ENCOUNTER — Observation Stay (HOSPITAL_COMMUNITY)
Admission: EM | Admit: 2014-11-18 | Discharge: 2014-11-20 | Disposition: A | Discharge status: Home / Self Care | Payer: Medicare Other | Attending: Internal Medicine | Admitting: Internal Medicine

## 2014-11-18 ENCOUNTER — Emergency Department (HOSPITAL_COMMUNITY): Payer: Medicare Other

## 2014-11-18 ENCOUNTER — Encounter (HOSPITAL_COMMUNITY): Payer: Self-pay | Admitting: Emergency Medicine

## 2014-11-18 DIAGNOSIS — E039 Hypothyroidism, unspecified: Secondary | ICD-10-CM | POA: Diagnosis not present

## 2014-11-18 DIAGNOSIS — I495 Sick sinus syndrome: Secondary | ICD-10-CM | POA: Diagnosis not present

## 2014-11-18 DIAGNOSIS — Z8673 Personal history of transient ischemic attack (TIA), and cerebral infarction without residual deficits: Secondary | ICD-10-CM | POA: Diagnosis not present

## 2014-11-18 DIAGNOSIS — K529 Noninfective gastroenteritis and colitis, unspecified: Secondary | ICD-10-CM | POA: Diagnosis not present

## 2014-11-18 DIAGNOSIS — I2510 Atherosclerotic heart disease of native coronary artery without angina pectoris: Secondary | ICD-10-CM | POA: Diagnosis not present

## 2014-11-18 DIAGNOSIS — K449 Diaphragmatic hernia without obstruction or gangrene: Secondary | ICD-10-CM | POA: Diagnosis not present

## 2014-11-18 DIAGNOSIS — N179 Acute kidney failure, unspecified: Secondary | ICD-10-CM | POA: Diagnosis not present

## 2014-11-18 DIAGNOSIS — K219 Gastro-esophageal reflux disease without esophagitis: Secondary | ICD-10-CM | POA: Diagnosis not present

## 2014-11-18 DIAGNOSIS — E1122 Type 2 diabetes mellitus with diabetic chronic kidney disease: Secondary | ICD-10-CM | POA: Insufficient documentation

## 2014-11-18 DIAGNOSIS — Z7901 Long term (current) use of anticoagulants: Secondary | ICD-10-CM | POA: Diagnosis not present

## 2014-11-18 DIAGNOSIS — I480 Paroxysmal atrial fibrillation: Secondary | ICD-10-CM | POA: Insufficient documentation

## 2014-11-18 DIAGNOSIS — R112 Nausea with vomiting, unspecified: Secondary | ICD-10-CM

## 2014-11-18 DIAGNOSIS — I700 Atherosclerosis of aorta: Secondary | ICD-10-CM | POA: Insufficient documentation

## 2014-11-18 DIAGNOSIS — E785 Hyperlipidemia, unspecified: Secondary | ICD-10-CM | POA: Insufficient documentation

## 2014-11-18 DIAGNOSIS — I129 Hypertensive chronic kidney disease with stage 1 through stage 4 chronic kidney disease, or unspecified chronic kidney disease: Secondary | ICD-10-CM | POA: Diagnosis not present

## 2014-11-18 DIAGNOSIS — E872 Acidosis, unspecified: Secondary | ICD-10-CM

## 2014-11-18 DIAGNOSIS — E860 Dehydration: Secondary | ICD-10-CM | POA: Insufficient documentation

## 2014-11-18 DIAGNOSIS — E119 Type 2 diabetes mellitus without complications: Secondary | ICD-10-CM

## 2014-11-18 DIAGNOSIS — Z950 Presence of cardiac pacemaker: Secondary | ICD-10-CM | POA: Insufficient documentation

## 2014-11-18 DIAGNOSIS — Z23 Encounter for immunization: Secondary | ICD-10-CM | POA: Diagnosis not present

## 2014-11-18 DIAGNOSIS — I517 Cardiomegaly: Secondary | ICD-10-CM | POA: Insufficient documentation

## 2014-11-18 DIAGNOSIS — I10 Essential (primary) hypertension: Secondary | ICD-10-CM | POA: Diagnosis present

## 2014-11-18 DIAGNOSIS — R197 Diarrhea, unspecified: Secondary | ICD-10-CM

## 2014-11-18 DIAGNOSIS — I4891 Unspecified atrial fibrillation: Secondary | ICD-10-CM | POA: Insufficient documentation

## 2014-11-18 DIAGNOSIS — N183 Chronic kidney disease, stage 3 (moderate): Secondary | ICD-10-CM | POA: Insufficient documentation

## 2014-11-18 LAB — COMPREHENSIVE METABOLIC PANEL
ALT: 19 U/L (ref 14–54)
AST: 24 U/L (ref 15–41)
Albumin: 3.6 g/dL (ref 3.5–5.0)
Alkaline Phosphatase: 70 U/L (ref 38–126)
Anion gap: 14 (ref 5–15)
BUN: 28 mg/dL — ABNORMAL HIGH (ref 6–20)
CO2: 23 mmol/L (ref 22–32)
Calcium: 9.5 mg/dL (ref 8.9–10.3)
Chloride: 101 mmol/L (ref 101–111)
Creatinine, Ser: 1.73 mg/dL — ABNORMAL HIGH (ref 0.44–1.00)
GFR calc Af Amer: 31 mL/min — ABNORMAL LOW (ref >60)
GFR calc non Af Amer: 27 mL/min — ABNORMAL LOW (ref >60)
Glucose, Bld: 216 mg/dL — ABNORMAL HIGH (ref 65–99)
Potassium: 4.1 mmol/L (ref 3.5–5.1)
Sodium: 138 mmol/L (ref 135–145)
Total Bilirubin: 0.4 mg/dL (ref 0.3–1.2)
Total Protein: 6.8 g/dL (ref 6.5–8.1)

## 2014-11-18 LAB — CBC WITH DIFFERENTIAL/PLATELET
Basophils Absolute: 0 10*3/uL (ref 0.0–0.1)
Basophils Relative: 0 %
Eosinophils Absolute: 1 10*3/uL — ABNORMAL HIGH (ref 0.0–0.7)
Eosinophils Relative: 14 %
HCT: 39.6 % (ref 36.0–46.0)
Hemoglobin: 13.1 g/dL (ref 12.0–15.0)
Lymphocytes Relative: 12 %
Lymphs Abs: 0.9 10*3/uL (ref 0.7–4.0)
MCH: 27.5 pg (ref 26.0–34.0)
MCHC: 33.1 g/dL (ref 30.0–36.0)
MCV: 83 fL (ref 78.0–100.0)
Monocytes Absolute: 0.6 10*3/uL (ref 0.1–1.0)
Monocytes Relative: 8 %
Neutro Abs: 4.7 10*3/uL (ref 1.7–7.7)
Neutrophils Relative %: 66 %
Platelets: 190 10*3/uL (ref 150–400)
RBC: 4.77 MIL/uL (ref 3.87–5.11)
RDW: 16.4 % — ABNORMAL HIGH (ref 11.5–15.5)
WBC: 7.1 10*3/uL (ref 4.0–10.5)

## 2014-11-18 LAB — URINALYSIS, ROUTINE W REFLEX MICROSCOPIC
Bilirubin Urine: NEGATIVE
Glucose, UA: NEGATIVE mg/dL
Hgb urine dipstick: NEGATIVE
Ketones, ur: NEGATIVE mg/dL
Leukocytes, UA: NEGATIVE
Nitrite: NEGATIVE
Protein, ur: NEGATIVE mg/dL
Specific Gravity, Urine: 1.022 (ref 1.005–1.030)
Urobilinogen, UA: 0.2 mg/dL (ref 0.0–1.0)
pH: 5 (ref 5.0–8.0)

## 2014-11-18 LAB — C DIFFICILE QUICK SCREEN W PCR REFLEX
C Diff antigen: NEGATIVE
C Diff interpretation: NEGATIVE
C Diff toxin: NEGATIVE

## 2014-11-18 LAB — I-STAT CG4 LACTIC ACID, ED
Lactic Acid, Venous: 3.34 mmol/L (ref 0.5–2.0)
Lactic Acid, Venous: 1.9 mmol/L (ref 0.5–2.0)

## 2014-11-18 LAB — I-STAT TROPONIN, ED: Troponin i, poc: 0.04 ng/mL (ref 0.00–0.08)

## 2014-11-18 LAB — PROTIME-INR
INR: 3.92 — ABNORMAL HIGH (ref 0.00–1.49)
Prothrombin Time: 37.4 seconds — ABNORMAL HIGH (ref 11.6–15.2)

## 2014-11-18 LAB — LIPASE, BLOOD: Lipase: 23 U/L (ref 22–51)

## 2014-11-18 LAB — CBG MONITORING, ED: Glucose-Capillary: 226 mg/dL — ABNORMAL HIGH (ref 65–99)

## 2014-11-18 MED ORDER — ONDANSETRON HCL 4 MG/2ML IJ SOLN
4.0000 mg | Freq: Once | INTRAMUSCULAR | Status: AC
  Administered 2014-11-18: 4 mg via INTRAVENOUS
  Filled 2014-11-18: qty 2

## 2014-11-18 MED ORDER — SODIUM CHLORIDE 0.9 % IV BOLUS (SEPSIS): 1000.0000 mL | Freq: Once | INTRAVENOUS | Status: DC

## 2014-11-18 MED ORDER — IOHEXOL 300 MG/ML  SOLN
25.0000 mL | INTRAMUSCULAR | Status: AC
  Administered 2014-11-18: 25 mL via ORAL

## 2014-11-18 MED ORDER — CIPROFLOXACIN IN D5W 400 MG/200ML IV SOLN
400.0000 mg | Freq: Once | INTRAVENOUS | Status: AC
  Administered 2014-11-19: 400 mg via INTRAVENOUS
  Filled 2014-11-18: qty 200

## 2014-11-18 MED ORDER — SODIUM CHLORIDE 0.9 % IV BOLUS (SEPSIS)
1000.0000 mL | Freq: Once | INTRAVENOUS | Status: AC
  Administered 2014-11-18: 1000 mL via INTRAVENOUS

## 2014-11-18 MED ORDER — METRONIDAZOLE IN NACL 5-0.79 MG/ML-% IV SOLN
500.0000 mg | Freq: Once | INTRAVENOUS | Status: AC
  Administered 2014-11-19: 500 mg via INTRAVENOUS
  Filled 2014-11-18: qty 100

## 2014-11-18 NOTE — ED Provider Notes (Signed)
CSN: [id number]     Arrival date & time 11/18/14  1752 History   First MD Initiated Contact with Patient 11/18/14 1755     Chief Complaint  Patient presents with  . Weakness  . Emesis   Patient is a 81 y.o. female presenting with vomiting and general illness. The history is provided by the patient. No language interpreter was used.  Emesis Associated symptoms: abdominal pain and diarrhea   Associated symptoms: no chills and no headaches   Illness Location:  Generalized Quality:  N/V/D Severity:  Moderate Onset quality:  Gradual Timing:  Constant Progression:  Unchanged Chronicity:  New Context:  PMHx of HTN, HLD, DMT2, PAF (taking Coumadin), & diverticulosis presenting with N/V/D. onset 5 days ago. Emesis bilious but nonbloody. Diarrhea watery and nonbloody. Patient treated with 2 rounds of antibiotics outpatient 1 month ago for a UTI. Patient denies recent suspicious food intake, recent travel, recent hospital admission, or sick contacts. Patient denies abdominal pain, fever, chills, cough, SOB, CP, hematemesis, hematochezia, melena, hematuria, dysuria, urinary frequency, or urinary urgency. Associated symptoms: abdominal pain, diarrhea, fatigue, nausea and vomiting   Associated symptoms: no chest pain, no congestion, no cough, no fever, no headaches, no loss of consciousness, no rhinorrhea and no shortness of breath     Past Medical History  Diagnosis Date  . Stroke 1999  . Paroxysmal atrial fibrillation     and atrial tachycardia  . Tachycardia-bradycardia syndrome     s/p PPM  . DM2 (diabetes mellitus, type 2)   . Hyperlipidemia   . HTN (hypertension)   . Microalbuminuria     last done in 1/07   . Hypothyroidism   . Esophageal reflux   . Diverticulosis   . Unspecified hypothyroidism 11/20/2013   Past Surgical History  Procedure Laterality Date  . Hemorrhoid surgery    . Hysterectomy (other)    . Permanent pacemaker  1999    updated 2006 (MDT) by Dr Verlon Setting; gen  change 09-2012 by Dr Rayann Heman (MDT)  . Cholecystectomy    . Pacemaker generator change  10/03/12    DMT Advisa DR gen change by Dr Rayann Heman  . Colonoscopy N/A 11/21/2013    Procedure: COLONOSCOPY;  Surgeon: Jeryl Columbia, MD;  Location: [facility] ENDOSCOPY;  Service: Endoscopy;  Laterality: N/A;  . Pacemaker generator change N/A 10/03/2012    Procedure: PACEMAKER GENERATOR CHANGE;  Surgeon: Thompson Grayer, MD;  Location: [facility] CATH LAB;  Service: Cardiovascular;  Laterality: N/A;   No family history on file. Social History  Substance Use Topics  . Smoking status: Never Smoker   . Smokeless tobacco: Never Used  . Alcohol Use: No   OB History    No data available      Review of Systems  Constitutional: Positive for fatigue. Negative for fever and chills.  HENT: Negative for congestion and rhinorrhea.   Respiratory: Negative for cough and shortness of breath.   Cardiovascular: Negative for chest pain.  Gastrointestinal: Positive for nausea, vomiting, abdominal pain and diarrhea. Negative for constipation, blood in stool, abdominal distention, anal bleeding and rectal pain.  Genitourinary: Positive for urgency. Negative for dysuria, frequency and hematuria.  Neurological: Negative for loss of consciousness and headaches.  All other systems reviewed and are negative.   Allergies  Review of patient's allergies indicates no known allergies.  Home Medications   Prior to Admission medications   Medication Sig Start Date End Date Taking? Authorizing Ariv Penrod  diltiazem (DILACOR XR) 180 MG 24 hr capsule Take 180  mg by mouth daily.   Yes Historical Tasfia Vasseur, MD  FERGON 240 (27 FE) MG tablet Take 1 tablet by mouth 3 (three) times daily. 10/31/14  Yes Historical Larone Kliethermes, MD  furosemide (LASIX) 20 MG tablet Take 20 mg by mouth daily.   Yes Historical Chanita Boden, MD  glipiZIDE (GLUCOTROL) 10 MG tablet Take 10 mg by mouth 2 (two) times daily before a meal.   Yes Historical Tam Delisle, MD  levothyroxine (SYNTHROID,  LEVOTHROID) 25 MCG tablet Take 25 mcg by mouth daily before breakfast.   Yes Historical Machele Deihl, MD  metFORMIN (GLUCOPHAGE) 1000 MG tablet Take 1,000 mg by mouth 2 (two) times daily with a meal.   Yes Historical Jacobo Moncrief, MD  metoprolol tartrate (LOPRESSOR) 25 MG tablet Take 25 mg by mouth 2 (two) times daily.   Yes Historical Javier Mamone, MD  potassium chloride SA (K-DUR,KLOR-CON) 20 MEQ tablet Take 20 mEq by mouth daily.   Yes Historical Shabria Egley, MD  ranitidine (ZANTAC) 150 MG capsule Take 150 mg by mouth every morning.    Yes Historical Kordelia Severin, MD  rosuvastatin (CRESTOR) 10 MG tablet Take 10 mg by mouth daily.   Yes Historical Sevag Shearn, MD  warfarin (COUMADIN) 2.5 MG tablet Take 0.5-1 tablets (1.25-2.5 mg total) by mouth daily. Resume on Monday 11/24/13.  2.5 mg daily except 1.25 mg on Wednesday 11/24/13  Yes Eugenie Filler, MD   BP 125/63 mmHg  Pulse 59  Temp(Src) 97.8 F (36.6 C) (Oral)  Resp 16  Ht 5\' 3"  (1.6 m)  Wt 185 lb (83.915 kg)  BMI 32.78 kg/m2  SpO2 95%   Physical Exam  Constitutional: She is oriented to person, place, and time. She appears well-developed and well-nourished. No distress.  HENT:  Head: Normocephalic and atraumatic.  Eyes: Conjunctivae are normal. Pupils are equal, round, and reactive to light.  Neck: Normal range of motion. Neck supple.  Cardiovascular: Normal rate, regular rhythm and intact distal pulses.   Pulmonary/Chest: Effort normal and breath sounds normal.  Abdominal: Soft. Bowel sounds are normal. She exhibits no distension. There is tenderness (eopigastrum). There is no rebound and no guarding.  Musculoskeletal: Normal range of motion.  Neurological: She is alert and oriented to person, place, and time.  Skin: Skin is warm and dry. She is not diaphoretic.  Nursing note and vitals reviewed.   ED Course  Procedures   Labs Review Labs Reviewed  COMPREHENSIVE METABOLIC PANEL - Abnormal; Notable for the following:    Glucose, Bld 216 (*)     BUN 28 (*)    Creatinine, Ser 1.73 (*)    GFR calc non Af Amer 27 (*)    GFR calc Af Amer 31 (*)    All other components within normal limits  CBC WITH DIFFERENTIAL/PLATELET - Abnormal; Notable for the following:    RDW 16.4 (*)    Eosinophils Absolute 1.0 (*)    All other components within normal limits  PROTIME-INR - Abnormal; Notable for the following:    Prothrombin Time 37.4 (*)    INR 3.92 (*)    All other components within normal limits  CBG MONITORING, ED - Abnormal; Notable for the following:    Glucose-Capillary 226 (*)    All other components within normal limits  I-STAT CG4 LACTIC ACID, ED - Abnormal; Notable for the following:    Lactic Acid, Venous 3.34 (*)    All other components within normal limits  C DIFFICILE QUICK SCREEN W PCR REFLEX  URINE CULTURE  LIPASE, BLOOD  URINALYSIS,  ROUTINE W REFLEX MICROSCOPIC (NOT AT [facility])  I-STAT TROPOININ, ED  I-STAT CG4 LACTIC ACID, ED   Imaging Review Ct Abdomen Pelvis Wo Contrast  11/18/2014   CLINICAL DATA:  Pt from home for eval of ongoing emesis and weakness x5 days, pt with hx of DM and states CBG has been increasing even with taking metformin. Pt states every time she tries to eat she feels bloated and vomits.  EXAM: CT ABDOMEN AND PELVIS WITHOUT CONTRAST  TECHNIQUE: Multidetector CT imaging of the abdomen and pelvis was performed following the standard protocol without IV contrast.  COMPARISON:  07/10/2003  FINDINGS: Lower chest: Pacemaker/AICD lead to the right ventricle. The heart is enlarged. Coronary artery calcifications and dense mitral calcification noted. Atelectasis/ scarring identified at the lung bases.  Upper abdomen: Status post cholecystectomy. No focal abnormality identified within the liver, spleen, pancreas, or adrenal glands. There is a low-attenuation lesion within the upper pole of the right kidney measures 2.2 cm. Lesion appears low-attenuation but is not fully characterized on this noncontrast exam. There  is a low-attenuation lesion within the lower pole the right kidney measuring 2.1 cm. Left kidney has a normal appearance. There is no hydronephrosis. No intrarenal calculi.  Gastrointestinal tract: Small hiatal hernia is present. The stomach otherwise has a normal appearance. The wall of the proximal small bowel loops appears thickened, beginning with the transverse portion of the duodenum an involving multiple loops of jejunum in the left upper quadrant. The mid small bowel loops appear mildly dilated. Distal small bowel loops are normal in caliber. Colonic loops contain numerous diverticula but there is no associated inflammation. The appendix is normal in appearance.  Pelvis: Urinary bladder has a normal appearance. The uterus is absent. No adnexal mass. No free pelvic fluid.  Retroperitoneum: There is atherosclerotic calcification of the abdominal aorta. Aorta is tortuous but not aneurysmal. No retroperitoneal or mesenteric adenopathy.  Abdominal wall: Small paraumbilical hernia contains mesenteric fat.  Osseous structures: Chronic wedge compression fracture of L2. No suspicious lytic or blastic lesions are identified.  IMPRESSION: 1. Abnormal small bowel. There is wall thickening of the distal duodenum and jejunal loops. Ileal loops appear mildly dilated. No associated obstruction. Considerations include inflammatory or infectious causes. There is no significant abdominal adenopathy. 2. Normal appendix. 3. Significant colonic diverticulosis without acute diverticulitis. 4. Coronary artery disease. 5. Cholecystectomy. 6. Low-attenuation lesions within the right kidney are consistent with cysts. 7. Chronic L1 compression fracture.   Electronically Signed   By: Nolon Nations M.D.   On: 11/18/2014 23:42   Dg Chest Portable 1 View  11/18/2014   CLINICAL DATA:  Weakness, nausea, emesis and diarrhea x 5 days. Previous right sided chest pain. No SOB  EXAM: PORTABLE CHEST - 1 VIEW  COMPARISON:  01/02/2014   FINDINGS: Right-sided pacemaker leads to the right atrium and right ventricle. The heart is mildly enlarged. There are no focal consolidations. No pleural effusions or pulmonary edema.  IMPRESSION: 1. Cardiomegaly. 2. No edema.   Electronically Signed   By: Nolon Nations M.D.   On: 11/18/2014 19:02   I have personally reviewed and evaluated these images and lab results as part of my medical decision-making.   EKG Interpretation   Date/Time:  Wednesday November 18 2014 19:10:04 EDT Ventricular Rate:  64 PR Interval:  66 QRS Duration: 162 QT Interval:  468 QTC Calculation: 483 R Axis:   -23 Text Interpretation:  Atrial-ventricular dual-paced rhythm No further  analysis attempted due to paced rhythm No  significant change since last  tracing Confirmed by YAO  MD, DAVID ([id number]) on 11/18/2014 7:25:21 PM      MDM  Tiffany [name] is an 81 yo female w/ PMHx of HTN, HLD, DMT2, PAF (taking Coumadin), & diverticulosis presenting with N/V/D. onset 5 days ago. Emesis bilious but nonbloody. Diarrhea watery and nonbloody. Also notes urinary urgency. Patient treated with 2 rounds of antibiotics outpatient 1 month ago for a UTI. Patient denies recent suspicious food intake, recent travel, recent hospital admission, or sick contacts. Patient denies abdominal pain, fever, chills, cough, SOB, CP, hematemesis, hematochezia, melena, hematuria, dysuria, or urinary frequenc. PSHx notable for hysterectomy, cholecystectomy, and hemorrhoidectomy.  Elderly female lying in the stretcher in NAD. Afebrile. Not tachycardic. Normotensive. Breathing well on RA and maintaining saturations without supplemental oxygen. Lungs CTAB. Abd w/ mild TTP of epigastrum w/o guarding or rebound. CV RRR. Remainder of examination unremarkable.  EKG showing no ST elevation or depression. I-STAT troponin 0.04. I-STAT lactic acid 3.34 - patient given 2 boluses of normal saline in the ED, repeat lactic acid following fluid resuscitation was  1.9. CMP notable for BUN/creatinine of 28/1.73 (chart review revealing baseline creatinine of 0.9-1.0) and glucose of 216. Lipase 23. Given lactic acidosis and acute renal failure a CT abdomen pelvis was obtained and showed enteritis but no evidence of appendicitis, diverticulitis, or other acute pathology. Tested for Clostridium difficile using stool sample which was negative. Laboratory and imaging results were personally reviewed by myself and used in the medical decision making of this patient's treatment and disposition.  Patient admitted to hospital service for further evaluation and management of severe dehydration with resulting acute renal failure. Pt understands and agrees with the plan and has no further questions or concerns.   Pt care discussed with and followed by my attending, Dr. Shirlyn Goltz  Mayer Camel, MD Pager [phone]   Final diagnoses:  Nausea vomiting and diarrhea  Dehydration  Acute renal failure, unspecified acute renal failure type  Lactic acidosis  Enteritis    Mayer Camel, MD 11/19/14 [id number]  Wandra Arthurs, MD 11/19/14 1104

## 2014-11-18 NOTE — ED Notes (Signed)
Rose(daughter) POA [phone]  ** call with any updates. **

## 2014-11-18 NOTE — ED Notes (Signed)
CBG 226

## 2014-11-18 NOTE — ED Notes (Signed)
Family at bedside and updated on plan of care and informed of admission per Dr. Darl Householder. Family planning to leave at this time and numbers given to this RN.

## 2014-11-18 NOTE — ED Notes (Signed)
Per EMS: pt from home for eval of ongoing emesis and weakness x5 days, pt with hx of DM and states CBG has been increasing even with taking metformin. Pt states everytime she tries to eat she feels bloated and vomits. no neuro deficits noted per EMS. Pt with pacemaker. No cp or sob noted. Axo x4. Pt from home.

## 2014-11-19 ENCOUNTER — Encounter (HOSPITAL_COMMUNITY): Payer: Self-pay | Admitting: Internal Medicine

## 2014-11-19 ENCOUNTER — Observation Stay (HOSPITAL_BASED_OUTPATIENT_CLINIC_OR_DEPARTMENT_OTHER): Payer: Medicare Other

## 2014-11-19 DIAGNOSIS — I4891 Unspecified atrial fibrillation: Secondary | ICD-10-CM

## 2014-11-19 DIAGNOSIS — N179 Acute kidney failure, unspecified: Secondary | ICD-10-CM | POA: Diagnosis present

## 2014-11-19 DIAGNOSIS — K529 Noninfective gastroenteritis and colitis, unspecified: Secondary | ICD-10-CM | POA: Diagnosis present

## 2014-11-19 LAB — COMPREHENSIVE METABOLIC PANEL
ALT: 17 U/L (ref 14–54)
AST: 18 U/L (ref 15–41)
Albumin: 2.8 g/dL — ABNORMAL LOW (ref 3.5–5.0)
Alkaline Phosphatase: 59 U/L (ref 38–126)
Anion gap: 8 (ref 5–15)
BUN: 20 mg/dL (ref 6–20)
CO2: 24 mmol/L (ref 22–32)
Calcium: 7.8 mg/dL — ABNORMAL LOW (ref 8.9–10.3)
Chloride: 104 mmol/L (ref 101–111)
Creatinine, Ser: 1.42 mg/dL — ABNORMAL HIGH (ref 0.44–1.00)
GFR calc Af Amer: 39 mL/min — ABNORMAL LOW (ref >60)
GFR calc non Af Amer: 34 mL/min — ABNORMAL LOW (ref >60)
Glucose, Bld: 127 mg/dL — ABNORMAL HIGH (ref 65–99)
Potassium: 3.8 mmol/L (ref 3.5–5.1)
Sodium: 136 mmol/L (ref 135–145)
Total Bilirubin: 0.6 mg/dL (ref 0.3–1.2)
Total Protein: 5.5 g/dL — ABNORMAL LOW (ref 6.5–8.1)

## 2014-11-19 LAB — CBC WITH DIFFERENTIAL/PLATELET
Basophils Absolute: 0 10*3/uL (ref 0.0–0.1)
Basophils Relative: 0 %
Eosinophils Absolute: 2.8 10*3/uL — ABNORMAL HIGH (ref 0.0–0.7)
Eosinophils Relative: 38 %
HCT: 33.1 % — ABNORMAL LOW (ref 36.0–46.0)
Hemoglobin: 10.8 g/dL — ABNORMAL LOW (ref 12.0–15.0)
Lymphocytes Relative: 14 %
Lymphs Abs: 1 10*3/uL (ref 0.7–4.0)
MCH: 27.6 pg (ref 26.0–34.0)
MCHC: 32.6 g/dL (ref 30.0–36.0)
MCV: 84.4 fL (ref 78.0–100.0)
Monocytes Absolute: 0.5 10*3/uL (ref 0.1–1.0)
Monocytes Relative: 7 %
Neutro Abs: 3 10*3/uL (ref 1.7–7.7)
Neutrophils Relative %: 41 %
Platelets: 148 10*3/uL — ABNORMAL LOW (ref 150–400)
RBC: 3.92 MIL/uL (ref 3.87–5.11)
RDW: 16.8 % — ABNORMAL HIGH (ref 11.5–15.5)
WBC: 7.3 10*3/uL (ref 4.0–10.5)

## 2014-11-19 LAB — GLUCOSE, CAPILLARY
Glucose-Capillary: 177 mg/dL — ABNORMAL HIGH (ref 65–99)
Glucose-Capillary: 207 mg/dL — ABNORMAL HIGH (ref 65–99)
Glucose-Capillary: 144 mg/dL — ABNORMAL HIGH (ref 65–99)

## 2014-11-19 LAB — PROTIME-INR
INR: 4.32 — ABNORMAL HIGH (ref 0.00–1.49)
Prothrombin Time: 40.2 seconds — ABNORMAL HIGH (ref 11.6–15.2)

## 2014-11-19 MED ORDER — METRONIDAZOLE 500 MG PO TABS
250.0000 mg | ORAL_TABLET | Freq: Three times a day (TID) | ORAL | Status: DC
  Administered 2014-11-19 – 2014-11-20 (×4): 250 mg via ORAL
  Filled 2014-11-19 (×4): qty 1

## 2014-11-19 MED ORDER — CIPROFLOXACIN IN D5W 400 MG/200ML IV SOLN: 400.0000 mg | Freq: Two times a day (BID) | INTRAVENOUS | Status: DC

## 2014-11-19 MED ORDER — METOPROLOL TARTRATE 25 MG PO TABS
25.0000 mg | ORAL_TABLET | Freq: Two times a day (BID) | ORAL | Status: DC
  Administered 2014-11-19 – 2014-11-20 (×3): 25 mg via ORAL
  Filled 2014-11-19 (×3): qty 1

## 2014-11-19 MED ORDER — POTASSIUM CHLORIDE CRYS ER 20 MEQ PO TBCR
20.0000 meq | EXTENDED_RELEASE_TABLET | Freq: Every day | ORAL | Status: DC
  Administered 2014-11-19 – 2014-11-20 (×2): 20 meq via ORAL
  Filled 2014-11-19 (×2): qty 1

## 2014-11-19 MED ORDER — SODIUM CHLORIDE 0.9 % IV SOLN
INTRAVENOUS | Status: DC
  Administered 2014-11-19: 01:00:00 via INTRAVENOUS

## 2014-11-19 MED ORDER — DILTIAZEM HCL ER 180 MG PO CP24
180.0000 mg | ORAL_CAPSULE | Freq: Every day | ORAL | Status: DC
  Administered 2014-11-19 – 2014-11-20 (×2): 180 mg via ORAL
  Filled 2014-11-19 (×4): qty 1

## 2014-11-19 MED ORDER — CIPROFLOXACIN HCL 500 MG PO TABS
250.0000 mg | ORAL_TABLET | Freq: Two times a day (BID) | ORAL | Status: DC
  Administered 2014-11-19 – 2014-11-20 (×3): 250 mg via ORAL
  Filled 2014-11-19 (×3): qty 1

## 2014-11-19 MED ORDER — GLIPIZIDE 5 MG PO TABS
10.0000 mg | ORAL_TABLET | Freq: Two times a day (BID) | ORAL | Status: DC
  Administered 2014-11-19: 10 mg via ORAL
  Filled 2014-11-19: qty 2

## 2014-11-19 MED ORDER — WARFARIN SODIUM 2.5 MG PO TABS: 1.2500 mg | ORAL_TABLET | Freq: Every day | ORAL | Status: DC

## 2014-11-19 MED ORDER — FAMOTIDINE 20 MG PO TABS
20.0000 mg | ORAL_TABLET | Freq: Two times a day (BID) | ORAL | Status: DC
  Administered 2014-11-19 – 2014-11-20 (×3): 20 mg via ORAL
  Filled 2014-11-19 (×3): qty 1

## 2014-11-19 MED ORDER — WARFARIN - PHARMACIST DOSING INPATIENT
Freq: Every day | Status: DC
  Administered 2014-11-19: 19:00:00

## 2014-11-19 MED ORDER — ONDANSETRON HCL 4 MG PO TABS
4.0000 mg | ORAL_TABLET | Freq: Four times a day (QID) | ORAL | Status: DC | PRN
Start: 2014-11-19 — End: 2014-11-20

## 2014-11-19 MED ORDER — INSULIN ASPART 100 UNIT/ML ~~LOC~~ SOLN
0.0000 [IU] | Freq: Three times a day (TID) | SUBCUTANEOUS | Status: DC
  Administered 2014-11-19 – 2014-11-20 (×2): 3 [IU] via SUBCUTANEOUS

## 2014-11-19 MED ORDER — SODIUM CHLORIDE 0.9 % IJ SOLN: 3.0000 mL | Freq: Two times a day (BID) | INTRAMUSCULAR | Status: DC

## 2014-11-19 MED ORDER — INSULIN ASPART 100 UNIT/ML ~~LOC~~ SOLN: 0.0000 [IU] | Freq: Every day | SUBCUTANEOUS | Status: DC

## 2014-11-19 MED ORDER — LEVOTHYROXINE SODIUM 25 MCG PO TABS
25.0000 ug | ORAL_TABLET | Freq: Every day | ORAL | Status: DC
  Administered 2014-11-19 – 2014-11-20 (×2): 25 ug via ORAL
  Filled 2014-11-19 (×3): qty 1

## 2014-11-19 MED ORDER — ONDANSETRON HCL 4 MG/2ML IJ SOLN: 4.0000 mg | Freq: Four times a day (QID) | INTRAMUSCULAR | Status: DC | PRN

## 2014-11-19 MED ORDER — METRONIDAZOLE IN NACL 5-0.79 MG/ML-% IV SOLN
500.0000 mg | Freq: Three times a day (TID) | INTRAVENOUS | Status: DC
  Administered 2014-11-19: 500 mg via INTRAVENOUS
  Filled 2014-11-19: qty 100

## 2014-11-19 MED ORDER — ROSUVASTATIN CALCIUM 10 MG PO TABS
10.0000 mg | ORAL_TABLET | Freq: Every day | ORAL | Status: DC
  Administered 2014-11-19 – 2014-11-20 (×2): 10 mg via ORAL
  Filled 2014-11-19 (×2): qty 1

## 2014-11-19 MED ORDER — SODIUM CHLORIDE 0.9 % IV SOLN
INTRAVENOUS | Status: DC
  Administered 2014-11-19 – 2014-11-20 (×3): via INTRAVENOUS

## 2014-11-19 MED ORDER — INFLUENZA VAC SPLIT QUAD 0.5 ML IM SUSY
0.5000 mL | PREFILLED_SYRINGE | INTRAMUSCULAR | Status: AC
  Administered 2014-11-20: 0.5 mL via INTRAMUSCULAR
  Filled 2014-11-19: qty 0.5

## 2014-11-19 NOTE — Progress Notes (Signed)
ANTICOAGULATION CONSULT NOTE - Initial Consult  Pharmacy Consult for Coumadin Indication: atrial fibrillation  No Known Allergies  Patient Measurements: Height: 5\' 3"  (160 cm) Weight: 195 lb 11.2 oz (88.769 kg) IBW/kg (Calculated) : 52.4  Vital Signs: Temp: 97.7 F (36.5 C) (09/22 0146) Temp Source: Oral (09/22 0146) BP: 120/46 mmHg (09/22 0146) Pulse Rate: 64 (09/22 0146)  Labs:  Recent Labs  11/18/14 1525 11/18/14 1816  HGB  --  13.1  HCT  --  39.6  PLT  --  190  LABPROT 37.4*  --   INR 3.92*  --   CREATININE  --  1.73*    Estimated Creatinine Clearance: 27.4 mL/min (by C-G formula based on Cr of 1.73).   Medical History: Past Medical History  Diagnosis Date  . Stroke 1999  . Paroxysmal atrial fibrillation     and atrial tachycardia  . Tachycardia-bradycardia syndrome     s/p PPM  . DM2 (diabetes mellitus, type 2)   . Hyperlipidemia   . HTN (hypertension)   . Microalbuminuria     last done in 1/07   . Hypothyroidism   . Esophageal reflux   . Diverticulosis   . Unspecified hypothyroidism 11/20/2013    Medications:  Prescriptions prior to admission  Medication Sig Dispense Refill Last Dose  . diltiazem (DILACOR XR) 180 MG 24 hr capsule Take 180 mg by mouth daily.   11/18/2014 at Unknown time  . FERGON 240 (27 FE) MG tablet Take 1 tablet by mouth 3 (three) times daily.  3 11/18/2014 at Unknown time  . furosemide (LASIX) 20 MG tablet Take 20 mg by mouth daily.   11/18/2014 at Unknown time  . glipiZIDE (GLUCOTROL) 10 MG tablet Take 10 mg by mouth 2 (two) times daily before a meal.   11/18/2014 at Unknown time  . levothyroxine (SYNTHROID, LEVOTHROID) 25 MCG tablet Take 25 mcg by mouth daily before breakfast.   11/18/2014 at Unknown time  . metFORMIN (GLUCOPHAGE) 1000 MG tablet Take 1,000 mg by mouth 2 (two) times daily with a meal.   11/18/2014 at Unknown time  . metoprolol tartrate (LOPRESSOR) 25 MG tablet Take 25 mg by mouth 2 (two) times daily.   11/18/2014 at  0930  . potassium chloride SA (K-DUR,KLOR-CON) 20 MEQ tablet Take 20 mEq by mouth daily.   11/18/2014 at Unknown time  . ranitidine (ZANTAC) 150 MG capsule Take 150 mg by mouth every morning.    11/18/2014 at Unknown time  . rosuvastatin (CRESTOR) 10 MG tablet Take 10 mg by mouth daily.   11/18/2014 at Unknown time  . warfarin (COUMADIN) 2.5 MG tablet Take 0.5-1 tablets (1.25-2.5 mg total) by mouth daily. Resume on Monday 11/24/13.  2.5 mg daily except 1.25 mg on Wednesday   11/18/2014 at Unknown time    Assessment: 81 y.o. female presents with weakness and emesis. Pt on coumadin PTA for afib. Admit INR 3.92 (supratherapeutic). Home dose 2.5mg  daily except for 1.25mg  on Wed - last dose 9/21. CBC stable upon admission.  Goal of Therapy:  INR 2-3 Monitor platelets by anticoagulation protocol: Yes   Plan:  Daily INR Hold coumadin tonight  Sherlon Handing, PharmD, BCPS Clinical pharmacist, pager [phone] 11/19/2014,2:11 AM

## 2014-11-19 NOTE — Progress Notes (Signed)
Pt seen and examined, admitted earlier this am 82/F with N/V/D, CT with mild wall thickening of small intestine,  Clinically improved, continue IVF, change Abx to PO, cdiff negative Advance diet, home in 1-2days Pt eval, [name], [name]

## 2014-11-19 NOTE — Progress Notes (Signed)
*  PRELIMINARY RESULTS* Echocardiogram 2D Echocardiogram has been performed.  Leavy Cella 11/19/2014, 3:24 PM

## 2014-11-19 NOTE — Evaluation (Signed)
Physical Therapy Evaluation Patient Details Name: Tiffany [name] MRN: [id number] DOB: 1931-05-14 Today's Date: 11/19/2014   History of Present Illness  81 y.o. female with past medical history of CVA, paroxysmal atrial fibrillation, type 2 diabetes, hyperlipidemia, hypertension, diverticulosis, hypothyroidism who comes to the emergency department due to abdominal pain, nausea vomiting and diarrhea several times daily since Friday   Clinical Impression  Patient is s/p above resulting in functional limitations due to the deficits listed below (see PT Problem List). Anticipating that the patient will D/C home with family assistance. Patient reporting that her daughter is planning to stay with her for awhile. Patient was able to ambulate 75 feet with supervision using rolling walker. Patient states that she feels more stable using walker right now. Will continue with skilled PT to progress patients mobility and independence.        Follow Up Recommendations Home health PT;Supervision for mobility/OOB    Equipment Recommendations  Rolling walker with 5" wheels    Recommendations for Other Services       Precautions / Restrictions Precautions Precautions: Fall Restrictions Weight Bearing Restrictions: No      Mobility  Bed Mobility               General bed mobility comments: found and returned to bed.   Transfers Overall transfer level: Needs assistance Equipment used: Rolling walker (2 wheeled) Transfers: Sit to/from Stand Sit to Stand: Supervision            Ambulation/Gait Ambulation/Gait assistance: Supervision Ambulation Distance (Feet): 75 Feet Assistive device: Rolling walker (2 wheeled) Gait Pattern/deviations: Step-through pattern Gait velocity: decreased   General Gait Details: patient reports feeling more stable using rw.   Stairs            Wheelchair Mobility    Modified Rankin (Stroke Patients Only)       Balance Overall  balance assessment: Needs assistance Sitting-balance support: No upper extremity supported Sitting balance-Leahy Scale: Good     Standing balance support: Bilateral upper extremity supported Standing balance-Leahy Scale: Poor                               Pertinent Vitals/Pain Pain Assessment: Faces Faces Pain Scale: No hurt Pain Intervention(s): Monitored during session    Home Living Family/patient expects to be discharged to:: Private residence Living Arrangements: Alone Available Help at Discharge: Other (Comment) (reports that her daughter may stay with her for a few days) Type of Home: Apartment Home Access: Stairs to enter Entrance Stairs-Rails: None Entrance Stairs-Number of Steps: 1 Home Layout: One level Home Equipment: Cane - single point;Other (comment) (has a walker somewhere but not sure of details.)      Prior Function Level of Independence: Independent with assistive device(s)         Comments: using cane      Hand Dominance        Extremity/Trunk Assessment                         Communication   Communication: No difficulties  Cognition Arousal/Alertness: Awake/alert Behavior During Therapy: WFL for tasks assessed/performed Overall Cognitive Status: Within Functional Limits for tasks assessed                      General Comments      Exercises        Assessment/Plan    PT  Assessment Patient needs continued PT services  PT Diagnosis Difficulty walking   PT Problem List Decreased strength;Decreased balance;Decreased activity tolerance;Decreased mobility;Decreased knowledge of use of DME  PT Treatment Interventions DME instruction;Gait training;Stair training;Functional mobility training;Therapeutic activities;Balance training;Therapeutic exercise;Patient/family education   PT Goals (Current goals can be found in the Care Plan section) Acute Rehab PT Goals Patient Stated Goal: go home PT Goal  Formulation: With patient Time For Goal Achievement: 12/03/14 Potential to Achieve Goals: Good    Frequency Min 3X/week   Barriers to discharge        Co-evaluation               End of Session   Activity Tolerance: Patient tolerated treatment well Patient left: in bed;with call bell/phone within reach;with chair alarm set Nurse Communication: Mobility status    Functional Assessment Tool Used: clinical judgment Functional Limitation: Mobility: Walking and moving around Mobility: Walking and Moving Around Current Status [id number]): At least 20 percent but less than 40 percent impaired, limited or restricted Mobility: Walking and Moving Around Goal Status [phone]): At least 1 percent but less than 20 percent impaired, limited or restricted    Time: [id number] PT Time Calculation (min) (ACUTE ONLY): 28 min   Charges:   PT Evaluation $Initial PT Evaluation Tier I: 1 Procedure PT Treatments $Gait Training: 8-22 mins   PT G Codes:   PT G-Codes **NOT FOR INPATIENT CLASS** Functional Assessment Tool Used: clinical judgment Functional Limitation: Mobility: Walking and moving around Mobility: Walking and Moving Around Current Status [id number]): At least 20 percent but less than 40 percent impaired, limited or restricted Mobility: Walking and Moving Around Goal Status [phone]): At least 1 percent but less than 20 percent impaired, limited or restricted    Cassell Clement, PT, [place] Pager [phone] Office [phone]  11/19/2014, 12:24 PM

## 2014-11-19 NOTE — H&P (Signed)
[facility] History and Physical  Tiffany [name] [id number] DOB: Apr 21, 1931 DOA: 11/18/2014  Referring physician: Mayer Camel, MD PCP: Kevan Ny, MD   Chief Complaint: Nausea, vomiting and diarrhea.  HPI: Tiffany [name] is a 81 y.o. female with past medical history of CVA, paroxysmal atrial fibrillation, type 2 diabetes, hyperlipidemia, hypertension, diverticulosis, hypothyroidism who comes to the emergency department due to abdominal pain, nausea vomiting and diarrhea several times daily since Friday. She denies fever, chills but feels very weak and fatigued. She denies headache, chest pain, dyspnea, productive cough, or urinary symptoms. Workup in the ER reveals elevated BUN and creatinine. She is currently in no acute distress.  Review of Systems:  Constitutional:  No weight loss, night sweats, Fevers, chills, fatigue.  HEENT:  No headaches, Difficulty swallowing,Tooth/dental problems,Sore throat,  No sneezing, itching, ear ache, nasal congestion, post nasal drip,  Cardio-vascular:  No chest pain, Orthopnea, PND, swelling in lower extremities, anasarca, dizziness, palpitations  GI:  As above mentioned. She denies hematochezia or melena. appetite  Resp:  No shortness of breath with exertion or at rest. No excess mucus, no productive cough, No non-productive cough, No coughing up of blood.No change in color of mucus.No wheezing.No chest wall deformity  Skin:  no rash or lesions.  GU:  no dysuria, change in color of urine, no urgency or frequency. No flank pain.  Musculoskeletal:  Generalized weakness. No joint pain or swelling. No decreased range of motion. No back pain.  Psych:  No change in mood or affect. No depression or anxiety. No memory loss.   Past Medical History  Diagnosis Date  . Stroke 1999  . Paroxysmal atrial fibrillation     and atrial tachycardia  . Tachycardia-bradycardia syndrome     s/p PPM  . DM2 (diabetes mellitus, type  2)   . Hyperlipidemia   . HTN (hypertension)   . Microalbuminuria     last done in 1/07   . Hypothyroidism   . Esophageal reflux   . Diverticulosis   . Unspecified hypothyroidism 11/20/2013   Past Surgical History  Procedure Laterality Date  . Hemorrhoid surgery    . Hysterectomy (other)    . Permanent pacemaker  1999    updated 2006 (MDT) by Dr Verlon Setting; gen change 09-2012 by Dr Rayann Heman (MDT)  . Cholecystectomy    . Pacemaker generator change  10/03/12    DMT Advisa DR gen change by Dr Rayann Heman  . Colonoscopy N/A 11/21/2013    Procedure: COLONOSCOPY;  Surgeon: Jeryl Columbia, MD;  Location[facility] ENDOSCOPY;  Service: Endoscopy;  Laterality: N/A;  . Pacemaker generator change N/A 10/03/2012    Procedure: PACEMAKER GENERATOR CHANGE;  Surgeon: Thompson Grayer, MD;  Location: [facility] CATH LAB;  Service: Cardiovascular;  Laterality: N/A;   Social History:  reports that she has never smoked. She has never used smokeless tobacco. She reports that she does not drink alcohol or use illicit drugs.  No Known Allergies  History reviewed. No pertinent family history.   Prior to Admission medications   Medication Sig Start Date End Date Taking? Authorizing Provider  diltiazem (DILACOR XR) 180 MG 24 hr capsule Take 180 mg by mouth daily.   Yes Historical Provider, MD  FERGON 240 (27 FE) MG tablet Take 1 tablet by mouth 3 (three) times daily. 10/31/14  Yes Historical Provider, MD  furosemide (LASIX) 20 MG tablet Take 20 mg by mouth daily.   Yes Historical Provider, MD  glipiZIDE (GLUCOTROL) 10 MG tablet Take 10  mg by mouth 2 (two) times daily before a meal.   Yes Historical Provider, MD  levothyroxine (SYNTHROID, LEVOTHROID) 25 MCG tablet Take 25 mcg by mouth daily before breakfast.   Yes Historical Provider, MD  metFORMIN (GLUCOPHAGE) 1000 MG tablet Take 1,000 mg by mouth 2 (two) times daily with a meal.   Yes Historical Provider, MD  metoprolol tartrate (LOPRESSOR) 25 MG tablet Take 25 mg by mouth 2 (two) times daily.    Yes Historical Provider, MD  potassium chloride SA (K-DUR,KLOR-CON) 20 MEQ tablet Take 20 mEq by mouth daily.   Yes Historical Provider, MD  ranitidine (ZANTAC) 150 MG capsule Take 150 mg by mouth every morning.    Yes Historical Provider, MD  rosuvastatin (CRESTOR) 10 MG tablet Take 10 mg by mouth daily.   Yes Historical Provider, MD  warfarin (COUMADIN) 2.5 MG tablet Take 0.5-1 tablets (1.25-2.5 mg total) by mouth daily. Resume on Monday 11/24/13.  2.5 mg daily except 1.25 mg on Wednesday 11/24/13  Yes Eugenie Filler, MD   Physical Exam: Filed Vitals:   11/18/14 2130 11/18/14 2145 11/18/14 2245 11/18/14 2315  BP: 124/42 116/46 128/48 125/63  Pulse: 64 60 46 59  Temp:      TempSrc:      Resp: 19 24 19 16   Height:      Weight:      SpO2: 96% 94% 93% 95%    Wt Readings from Last 3 Encounters:  11/18/14 83.915 kg (185 lb)  11/21/13 87.181 kg (192 lb 3.2 oz)  11/10/13 87.998 kg (194 lb)    General:  Appears calm and comfortable Eyes: PERRL, normal lids, irises & conjunctiva ENT: grossly normal hearing, lips & tongue are dry. Neck: no LAD, masses or thyromegaly Cardiovascular: Irregularly irregular, No LE edema. Telemetry: Atrial fibrillation rate controlled. Respiratory: CTA bilaterally, no w/r/r. Normal respiratory effort. Abdomen: Positive bowel sounds, soft, positive mild left lower quadrant tenderness, no guarding, no rebound tenderness. Skin: no rash or induration seen on limited exam Musculoskeletal: grossly normal tone BUE/BLE Psychiatric: grossly normal mood and affect, speech fluent and appropriate Neurologic: grossly non-focal.          Labs on Admission:  Basic Metabolic Panel:  Recent Labs Lab 11/18/14 1816  NA 138  K 4.1  CL 101  CO2 23  GLUCOSE 216*  BUN 28*  CREATININE 1.73*  CALCIUM 9.5   Liver Function Tests:  Recent Labs Lab 11/18/14 1816  AST 24  ALT 19  ALKPHOS 70  BILITOT 0.4  PROT 6.8  ALBUMIN 3.6    Recent Labs Lab  11/18/14 1816  LIPASE 23   CBC:  Recent Labs Lab 11/18/14 1816  WBC 7.1  NEUTROABS 4.7  HGB 13.1  HCT 39.6  MCV 83.0  PLT 190    CBG:  Recent Labs Lab 11/18/14 1758  GLUCAP 226*    Radiological Exams on Admission: Ct Abdomen Pelvis Wo Contrast  11/18/2014   CLINICAL DATA:  Pt from home for eval of ongoing emesis and weakness x5 days, pt with hx of DM and states CBG has been increasing even with taking metformin. Pt states every time she tries to eat she feels bloated and vomits.  EXAM: CT ABDOMEN AND PELVIS WITHOUT CONTRAST  TECHNIQUE: Multidetector CT imaging of the abdomen and pelvis was performed following the standard protocol without IV contrast.  COMPARISON:  07/10/2003  FINDINGS: Lower chest: Pacemaker/AICD lead to the right ventricle. The heart is enlarged. Coronary artery calcifications and dense mitral  calcification noted. Atelectasis/ scarring identified at the lung bases.  Upper abdomen: Status post cholecystectomy. No focal abnormality identified within the liver, spleen, pancreas, or adrenal glands. There is a low-attenuation lesion within the upper pole of the right kidney measures 2.2 cm. Lesion appears low-attenuation but is not fully characterized on this noncontrast exam. There is a low-attenuation lesion within the lower pole the right kidney measuring 2.1 cm. Left kidney has a normal appearance. There is no hydronephrosis. No intrarenal calculi.  Gastrointestinal tract: Small hiatal hernia is present. The stomach otherwise has a normal appearance. The wall of the proximal small bowel loops appears thickened, beginning with the transverse portion of the duodenum an involving multiple loops of jejunum in the left upper quadrant. The mid small bowel loops appear mildly dilated. Distal small bowel loops are normal in caliber. Colonic loops contain numerous diverticula but there is no associated inflammation. The appendix is normal in appearance.  Pelvis: Urinary bladder  has a normal appearance. The uterus is absent. No adnexal mass. No free pelvic fluid.  Retroperitoneum: There is atherosclerotic calcification of the abdominal aorta. Aorta is tortuous but not aneurysmal. No retroperitoneal or mesenteric adenopathy.  Abdominal wall: Small paraumbilical hernia contains mesenteric fat.  Osseous structures: Chronic wedge compression fracture of L2. No suspicious lytic or blastic lesions are identified.  IMPRESSION: 1. Abnormal small bowel. There is wall thickening of the distal duodenum and jejunal loops. Ileal loops appear mildly dilated. No associated obstruction. Considerations include inflammatory or infectious causes. There is no significant abdominal adenopathy. 2. Normal appendix. 3. Significant colonic diverticulosis without acute diverticulitis. 4. Coronary artery disease. 5. Cholecystectomy. 6. Low-attenuation lesions within the right kidney are consistent with cysts. 7. Chronic L1 compression fracture.   Electronically Signed   By: Nolon Nations M.D.   On: 11/18/2014 23:42   Dg Chest Portable 1 View  11/18/2014   CLINICAL DATA:  Weakness, nausea, emesis and diarrhea x 5 days. Previous right sided chest pain. No SOB  EXAM: PORTABLE CHEST - 1 VIEW  COMPARISON:  01/02/2014  FINDINGS: Right-sided pacemaker leads to the right atrium and right ventricle. The heart is mildly enlarged. There are no focal consolidations. No pleural effusions or pulmonary edema.  IMPRESSION: 1. Cardiomegaly. 2. No edema.   Electronically Signed   By: Nolon Nations M.D.   On: 11/18/2014 19:02    EKG: Independently reviewed. Vent. rate 64 BPM PR interval 66 ms QRS duration 162 ms QT/QTc 468/483 ms P-R-T axes 0 -23 95 Atrial-ventricular dual-paced rhythm No further analysis attempted due to paced rhythm  Assessment/Plan Principal Problem:   Acute gastroenteritis C. difficile toxin was negative. Admit to telemetry monitoring  Continue careful IV hydration. Monitor electrolytes  and fluid status. Analgesics and antiemetics as needed. Continue IV antibiotics.  Active Problems:   DM type 2 (diabetes mellitus, type 2) Continue glipizide and monitor CBG.    Hyperlipidemia Continue current therapy and monitor LFTs.    Essential hypertension Continue Diltiazem and metoprolol. Monitor blood pressure.    Atrial fibrillation Continue current therapy with calcium channel blocker and beta blocker for rate control. Hold warfarin and asked pharmacy to dose. Daily PT/INR. Check echocardiogram to assess left ventricle function.    Hypothyroidism   ARF (acute renal failure)     Code Status: Full code. DVT Prophylaxis: The patient is on warfarin at home. INR is supratherapeutic Family Communication:  Disposition Plan: Admit for IV hydration and electrolyte replacement.  Time spent: Over 70 minutes were spent in  the process of this admission.  Reubin Milan [facility] Pager [phone].

## 2014-11-20 DIAGNOSIS — N179 Acute kidney failure, unspecified: Secondary | ICD-10-CM | POA: Diagnosis not present

## 2014-11-20 DIAGNOSIS — E860 Dehydration: Secondary | ICD-10-CM

## 2014-11-20 DIAGNOSIS — K529 Noninfective gastroenteritis and colitis, unspecified: Secondary | ICD-10-CM | POA: Diagnosis not present

## 2014-11-20 LAB — BASIC METABOLIC PANEL
Anion gap: 5 (ref 5–15)
BUN: 14 mg/dL (ref 6–20)
CO2: 24 mmol/L (ref 22–32)
Calcium: 7.9 mg/dL — ABNORMAL LOW (ref 8.9–10.3)
Chloride: 109 mmol/L (ref 101–111)
Creatinine, Ser: 1.43 mg/dL — ABNORMAL HIGH (ref 0.44–1.00)
GFR calc Af Amer: 39 mL/min — ABNORMAL LOW (ref >60)
GFR calc non Af Amer: 34 mL/min — ABNORMAL LOW (ref >60)
Glucose, Bld: 196 mg/dL — ABNORMAL HIGH (ref 65–99)
Potassium: 4.2 mmol/L (ref 3.5–5.1)
Sodium: 138 mmol/L (ref 135–145)

## 2014-11-20 LAB — CBC
HCT: 32.7 % — ABNORMAL LOW (ref 36.0–46.0)
Hemoglobin: 10.6 g/dL — ABNORMAL LOW (ref 12.0–15.0)
MCH: 27.6 pg (ref 26.0–34.0)
MCHC: 32.4 g/dL (ref 30.0–36.0)
MCV: 85.2 fL (ref 78.0–100.0)
Platelets: 154 10*3/uL (ref 150–400)
RBC: 3.84 MIL/uL — ABNORMAL LOW (ref 3.87–5.11)
RDW: 16.9 % — ABNORMAL HIGH (ref 11.5–15.5)
WBC: 7.7 10*3/uL (ref 4.0–10.5)

## 2014-11-20 LAB — URINE CULTURE: Culture: NO GROWTH

## 2014-11-20 LAB — PROTIME-INR
INR: 3.71 — ABNORMAL HIGH (ref 0.00–1.49)
Prothrombin Time: 35.9 seconds — ABNORMAL HIGH (ref 11.6–15.2)

## 2014-11-20 LAB — GLUCOSE, CAPILLARY
Glucose-Capillary: 170 mg/dL — ABNORMAL HIGH (ref 65–99)
Glucose-Capillary: 209 mg/dL — ABNORMAL HIGH (ref 65–99)

## 2014-11-20 MED ORDER — METRONIDAZOLE 250 MG PO TABS: 250.0000 mg | ORAL_TABLET | Freq: Three times a day (TID) | ORAL | Status: DC

## 2014-11-20 MED ORDER — CIPROFLOXACIN HCL 250 MG PO TABS: 250.0000 mg | ORAL_TABLET | Freq: Two times a day (BID) | ORAL | Status: DC

## 2014-11-20 MED ORDER — DIPHENHYDRAMINE HCL 12.5 MG/5ML PO ELIX
12.5000 mg | ORAL_SOLUTION | Freq: Once | ORAL | Status: AC
  Administered 2014-11-20: 12.5 mg via ORAL
  Filled 2014-11-20: qty 5

## 2014-11-20 MED ORDER — WARFARIN SODIUM 2.5 MG PO TABS: 1.2500 mg | ORAL_TABLET | Freq: Every day | ORAL | Status: DC

## 2014-11-20 MED ORDER — ONDANSETRON 4 MG PO TBDP: 4.0000 mg | ORAL_TABLET | Freq: Three times a day (TID) | ORAL | Status: DC | PRN

## 2014-11-20 NOTE — Progress Notes (Signed)
Pharmacist Provided - Patient Medication Education Prior to Discharge   Tiffany [name] is an 81 y.o. female who presented to [facility] on 11/18/2014 with a chief complaint of  Chief Complaint  Patient presents with  . Weakness  . Emesis     [x]  Patient will be discharged with 3 new medications []  Patient being discharged without any new medications  The following medications were discussed with the patient:  Flagyl, Cipro, Zofran   Pain Control medications: []  Yes    [x]  No  Diabetes Medications: []  Yes    [x]  No  Heart Failure Medications: []  Yes    [x]  No  Anticoagulation Medications:  []  Yes    [x]  No  Antibiotics at discharge: [x]  Yes    []  No  Allergy Assessment Completed and Updated: [x]  Yes    []  No Identified Patient Allergies: No Known Allergies   Medication Adherence Assessment: [x]  Excellent (no doses missed/week)      []  Good (1 dose missed/week)      []  Partial (2-3 doses missed/week)      []  Poor (>3 doses missed/week)  Barriers to Obtaining Medications: []  Yes [x]  No   Assessment: I spoke with the patient and her daughter (pt is to be discharged home with her) and they were both very eager to hear about her new medications. I spoke with her about Flagyl, Cipro, and zofran. I counseled her to avoid alcohol and any alcohol containing products (cough syrup, mouthwash, etc.) while on flagyl and for 2 days after the end of her 5 day course. I also counseled on the administration and additional side effects of all three new medications. Neither the patient nor her daughter had any addition questions regarding the therapy. They will be receiving hard copy prescriptions and getting the filled at the [facility] on [address].   Time spent preparing for discharge counseling: 15 Time spent counseling patient: [place]. Lennox Grumbles, PharmD Pharmacy Resident  Pager: [phone] 11/20/2014 5:12 PM

## 2014-11-20 NOTE — Care Management Note (Signed)
Case Management Note  Patient Details  Name: Tiffany [name] MRN: [id number] Date of Birth: [birth year]  Subjective/Objective:     Date: 923/16 Spoke with patient at the bedside along with her daughter , Tiffany [name]. Introduced self as Tourist information centre manager and explained role in discharge planning and how to be reached. Verified patient lives in town, alone, has DME rolling walker . Expressed no potential need for no other DME. Verified patient anticipates to go home with daughter, Tiffany [name], who will be staying with her, at time of discharge and will have full-time supervision by family  at this time to best of their knowledge. Patient  denied needing help with their medication. Patient drives  to MD appointments. Verified patient has PCP .  Patient chose [facility] for HHPT, referral made to [facility], [place] notified.  Soc will begin 24-48 hrs post dc.  Plan: CM will continue to follow for discharge planning and [facility] resources.                Action/Plan:   Expected Discharge Date:                  Expected Discharge Plan:  [place]  In-House Referral:     Discharge planning Services  CM Consult  Post Acute Care Choice:  Home Health Choice offered to:  Patient  DME Arranged:    DME Agency:     HH Arranged:    [place] Agency:     Status of Service:  Completed, signed off  Medicare Important Message Given:    Date Medicare IM Given:    Medicare IM give by:    Date Additional Medicare IM Given:    Additional Medicare Important Message give by:     If discussed at [place] [facility] of Stay Meetings, dates discussed:    Additional Comments:  Zenon Mayo, RN 11/20/2014, 4:27 PM

## 2014-11-20 NOTE — Progress Notes (Signed)
PT Cancellation Note  Patient Details Name: Tiffany [name] MRN: [id number] DOB: Aug 06, 1931   Cancelled Treatment:    Reason Eval/Treat Not Completed: Other (comment) (Pt declined as she is leaving and doesn't need PT).  Pt is discharging this afternoon.   Ramond Dial 11/20/2014, 11:45 AM   Mee Hives, PT MS Acute Rehab Dept. Number: [facility] [id number] and [place] [phone]

## 2014-11-20 NOTE — Discharge Summary (Addendum)
Physician Discharge Summary  Tiffany [name] [id number] DOB: [birth year] DOA: 11/18/2014  PCP: Tiffany Ny, MD  Admit date: 11/18/2014 Discharge date: 11/20/2014  Time spent: 45 minutes  Recommendations for Outpatient Follow-up:  1. PCP in 1 week 2. INR check in 1 week, advised to resume warfarin on 9/26, INR supratherapeutic  Discharge Diagnoses:  Principal Problem:   Acute gastroenteritis   Mild Ileitis   DM type 2 (diabetes mellitus, type 2)   Hyperlipidemia   Essential hypertension   Atrial fibrillation   Hypothyroidism   ARF (acute renal failure)   Acute renal failure syndrome   Dehydration   CKD 3  Discharge Condition: stable  Diet recommendation: Diabetic  Filed Weights   11/18/14 1755 11/19/14 0146  Weight: 83.915 kg (185 lb) 88.769 kg (195 lb 11.2 oz)    History of present illness:  Chief Complaint: Nausea, vomiting and diarrhea.  HPI: [name] is a 81 y.o. female with past medical history of CVA, paroxysmal atrial fibrillation, type 2 diabetes, hyperlipidemia, hypertension, diverticulosis, hypothyroidism who came to the ED due to abdominal pain, nausea vomiting and diarrhea several times daily since Friday. She denied fever, chills but feels very weak and fatigued. She denies headache, chest pain, dyspnea, productive cough, or urinary symptoms. Workup in the ER revealed elevated BUN and creatinine.  Hospital Course:   Nausea/Vomiting and Diarrhea -due to viral gastroenteritis, mild ileitis noted on CT -much improved, no further vomiting -was started on Cipro and Flagyl on admission, C diff PCR negative -tolerating diet and less diarrhea at the time of discharge, she is advised to Continue current Abx for 5 more days  AKi on CKD -due to dehydration, GI losses, improved -hydrated -creatinine stable a1 1.5 at discharge  H/o P.AFib -rate controlled, in NSR, INR up at 3.7 at discharge, advised to hold coumadin for next 3days then resume  9/26, needs INR check in 1 week   Discharge Exam: Filed Vitals:   11/20/14 0954  BP: 144/69  Pulse: 65  Temp: 98.6 F (37 C)  Resp:     General: AAOx3 Cardiovascular: S1S2/RRR Respiratory: CTAB  Discharge Instructions   Discharge Instructions    Diet - low sodium heart healthy    Complete by:  As directed      Increase activity slowly    Complete by:  As directed           Current Discharge Medication List    START taking these medications   Details  ciprofloxacin (CIPRO) 250 MG tablet Take 1 tablet (250 mg total) by mouth 2 (two) times daily. For 5days Qty: 10 tablet, Refills: 0    metroNIDAZOLE (FLAGYL) 250 MG tablet Take 1 tablet (250 mg total) by mouth every 8 (eight) hours. For 5 days Qty: 15 tablet, Refills: 0    ondansetron (ZOFRAN ODT) 4 MG disintegrating tablet Take 1 tablet (4 mg total) by mouth every 8 (eight) hours as needed for nausea or vomiting. Qty: 20 tablet, Refills: 0      CONTINUE these medications which have NOT CHANGED   Details  diltiazem (DILACOR XR) 180 MG 24 hr capsule Take 180 mg by mouth daily.    FERGON 240 (27 FE) MG tablet Take 1 tablet by mouth 3 (three) times daily. Refills: 3    furosemide (LASIX) 20 MG tablet Take 20 mg by mouth daily.    glipiZIDE (GLUCOTROL) 10 MG tablet Take 10 mg by mouth 2 (two) times daily before a meal.  levothyroxine (SYNTHROID, LEVOTHROID) 25 MCG tablet Take 25 mcg by mouth daily before breakfast.    metFORMIN (GLUCOPHAGE) 1000 MG tablet Take 1,000 mg by mouth 2 (two) times daily with a meal.    metoprolol tartrate (LOPRESSOR) 25 MG tablet Take 25 mg by mouth 2 (two) times daily.    potassium chloride SA (K-DUR,KLOR-CON) 20 MEQ tablet Take 20 mEq by mouth daily.    ranitidine (ZANTAC) 150 MG capsule Take 150 mg by mouth every morning.     rosuvastatin (CRESTOR) 10 MG tablet Take 10 mg by mouth daily.    warfarin (COUMADIN) 2.5 MG tablet Take 0.5-1 tablets (1.25-2.5 mg total) by mouth daily.  Resume on Monday 11/24/13.  2.5 mg daily except 1.25 mg on Wednesday       No Known Allergies Follow-up Information    Follow up with Tiffany Sa, ERIC, MD. Schedule an appointment as soon as possible for a visit in 1 week.   Specialty:  Internal Medicine   Contact information:   [address] [place] [address] [phone]        The results of significant diagnostics from this hospitalization (including imaging, microbiology, ancillary and laboratory) are listed below for reference.    Significant Diagnostic Studies: Ct Abdomen Pelvis Wo Contrast  11/18/2014   CLINICAL DATA:  Pt from home for eval of ongoing emesis and weakness x5 days, pt with hx of DM and states CBG has been increasing even with taking metformin. Pt states every time she tries to eat she feels bloated and vomits.  EXAM: CT ABDOMEN AND PELVIS WITHOUT CONTRAST  TECHNIQUE: Multidetector CT imaging of the abdomen and pelvis was performed following the standard protocol without IV contrast.  COMPARISON:  07/10/2003  FINDINGS: Lower chest: Pacemaker/AICD lead to the right ventricle. The heart is enlarged. Coronary artery calcifications and dense mitral calcification noted. Atelectasis/ scarring identified at the lung bases.  Upper abdomen: Status post cholecystectomy. No focal abnormality identified within the liver, spleen, pancreas, or adrenal glands. There is a low-attenuation lesion within the upper pole of the right kidney measures 2.2 cm. Lesion appears low-attenuation but is not fully characterized on this noncontrast exam. There is a low-attenuation lesion within the lower pole the right kidney measuring 2.1 cm. Left kidney has a normal appearance. There is no hydronephrosis. No intrarenal calculi.  Gastrointestinal tract: Small hiatal hernia is present. The stomach otherwise has a normal appearance. The wall of the proximal small bowel loops appears thickened, beginning with the transverse portion of the duodenum an  involving multiple loops of jejunum in the left upper quadrant. The mid small bowel loops appear mildly dilated. Distal small bowel loops are normal in caliber. Colonic loops contain numerous diverticula but there is no associated inflammation. The appendix is normal in appearance.  Pelvis: Urinary bladder has a normal appearance. The uterus is absent. No adnexal mass. No free pelvic fluid.  Retroperitoneum: There is atherosclerotic calcification of the abdominal aorta. Aorta is tortuous but not aneurysmal. No retroperitoneal or mesenteric adenopathy.  Abdominal wall: Small paraumbilical hernia contains mesenteric fat.  Osseous structures: Chronic wedge compression fracture of L2. No suspicious lytic or blastic lesions are identified.  IMPRESSION: 1. Abnormal small bowel. There is wall thickening of the distal duodenum and jejunal loops. Ileal loops appear mildly dilated. No associated obstruction. Considerations include inflammatory or infectious causes. There is no significant abdominal adenopathy. 2. Normal appendix. 3. Significant colonic diverticulosis without acute diverticulitis. 4. Coronary artery disease. 5. Cholecystectomy. 6. Low-attenuation lesions  within the right kidney are consistent with cysts. 7. Chronic L1 compression fracture.   Electronically Signed   By: Nolon Nations M.D.   On: 11/18/2014 23:42   Dg Chest Portable 1 View  11/18/2014   CLINICAL DATA:  Weakness, nausea, emesis and diarrhea x 5 days. Previous right sided chest pain. No SOB  EXAM: PORTABLE CHEST - 1 VIEW  COMPARISON:  01/02/2014  FINDINGS: Right-sided pacemaker leads to the right atrium and right ventricle. The heart is mildly enlarged. There are no focal consolidations. No pleural effusions or pulmonary edema.  IMPRESSION: 1. Cardiomegaly. 2. No edema.   Electronically Signed   By: Nolon Nations M.D.   On: 11/18/2014 19:02    Microbiology: Recent Results (from the past 240 hour(s))  C difficile quick scan w PCR  reflex     Status: None   Collection Time: 11/18/14  7:39 PM  Result Value Ref Range Status   C Diff antigen NEGATIVE NEGATIVE Final   C Diff toxin NEGATIVE NEGATIVE Final   C Diff interpretation Negative for toxigenic C. difficile  Final  Urine culture     Status: None   Collection Time: 11/18/14  7:41 PM  Result Value Ref Range Status   Specimen Description URINE, CATHETERIZED  Final   Special Requests NONE  Final   Culture NO GROWTH 2 DAYS  Final   Report Status 11/20/2014 FINAL  Final     Labs: Basic Metabolic Panel:  Recent Labs Lab 11/18/14 1816 11/19/14 0625 11/20/14 0606  NA 138 136 138  K 4.1 3.8 4.2  CL 101 104 109  CO2 23 24 24   GLUCOSE 216* 127* 196*  BUN 28* 20 14  CREATININE 1.73* 1.42* 1.43*  CALCIUM 9.5 7.8* 7.9*   Liver Function Tests:  Recent Labs Lab 11/18/14 1816 11/19/14 0625  AST 24 18  ALT 19 17  ALKPHOS 70 59  BILITOT 0.4 0.6  PROT 6.8 5.5*  ALBUMIN 3.6 2.8*    Recent Labs Lab 11/18/14 1816  LIPASE 23   No results for input(s): AMMONIA in the last 168 hours. CBC:  Recent Labs Lab 11/18/14 1816 11/19/14 0625 11/20/14 0606  WBC 7.1 7.3 7.7  NEUTROABS 4.7 3.0  --   HGB 13.1 10.8* 10.6*  HCT 39.6 33.1* 32.7*  MCV 83.0 84.4 85.2  PLT 190 148* 154   Cardiac Enzymes: No results for input(s): CKTOTAL, CKMB, CKMBINDEX, TROPONINI in the last 168 hours. BNP: BNP (last 3 results) No results for input(s): BNP in the last 8760 hours.  ProBNP (last 3 results) No results for input(s): PROBNP in the last 8760 hours.  CBG:  Recent Labs Lab 11/19/14 1226 11/19/14 1648 11/19/14 2225 11/20/14 0808 11/20/14 1159  GLUCAP 177* 207* 144* 170* 209*       Signed:  JOSEPH,PREETHA  [facility] 11/20/2014, 2:29 PM

## 2014-11-20 NOTE — Progress Notes (Signed)
Nsg Discharge Note  Admit Date:  11/18/2014 Discharge date: 11/20/2014   Inas Korhonen [facility] to be D/C'd Home per MD order.  AVS completed.  Copy for chart, and copy for patient signed, and dated. Patient/caregiver able to verbalize understanding.  Discharge Medication:   Medication List    TAKE these medications        ciprofloxacin 250 MG tablet  Commonly known as:  CIPRO  Take 1 tablet (250 mg total) by mouth 2 (two) times daily. For 5days     diltiazem 180 MG 24 hr capsule  Commonly known as:  DILACOR XR  Take 180 mg by mouth daily.     FERGON 240 (27 FE) MG tablet  Generic drug:  ferrous gluconate  Take 1 tablet by mouth 3 (three) times daily.     furosemide 20 MG tablet  Commonly known as:  LASIX  Take 20 mg by mouth daily.     glipiZIDE 10 MG tablet  Commonly known as:  GLUCOTROL  Take 10 mg by mouth 2 (two) times daily before a meal.     levothyroxine 25 MCG tablet  Commonly known as:  SYNTHROID, LEVOTHROID  Take 25 mcg by mouth daily before breakfast.     metFORMIN 1000 MG tablet  Commonly known as:  GLUCOPHAGE  Take 1,000 mg by mouth 2 (two) times daily with a meal.     metoprolol tartrate 25 MG tablet  Commonly known as:  LOPRESSOR  Take 25 mg by mouth 2 (two) times daily.     metroNIDAZOLE 250 MG tablet  Commonly known as:  FLAGYL  Take 1 tablet (250 mg total) by mouth every 8 (eight) hours. For 5 days     ondansetron 4 MG disintegrating tablet  Commonly known as:  ZOFRAN ODT  Take 1 tablet (4 mg total) by mouth every 8 (eight) hours as needed for nausea or vomiting.     potassium chloride SA 20 MEQ tablet  Commonly known as:  K-DUR,KLOR-CON  Take 20 mEq by mouth daily.     ranitidine 150 MG capsule  Commonly known as:  ZANTAC  Take 150 mg by mouth every morning.     rosuvastatin 10 MG tablet  Commonly known as:  CRESTOR  Take 10 mg by mouth daily.     warfarin 2.5 MG tablet  Commonly known as:  COUMADIN  Take 0.5-1 tablets (1.25-2.5 mg  total) by mouth daily. Resume on Monday 11/23/14,   2.5 mg daily except 1.25 mg on Wednesday        Discharge Assessment: Filed Vitals:   11/20/14 1440  BP: 109/42  Pulse: 62  Temp: 97.5 F (36.4 C)  Resp: 16  Skin clean, dry and intact without evidence of skin break down, no evidence of skin tears noted. IV catheter discontinued intact. Site without signs and symptoms of complications - no redness or edema noted at insertion site, patient denies c/o pain - only slight tenderness at site.  Dressing with slight pressure applied.  D/c Instructions-Education: Discharge instructions given to patient/family with verbalized understanding. D/c education completed with patient/family including follow up instructions, medication list, d/c activities limitations if indicated, with other d/c instructions as indicated by MD - patient able to verbalize understanding, all questions fully answered. Patient instructed to return to ED, call 911, or call MD for any changes in condition.  Patient escorted via [place], and D/C home via private auto.  Salley Slaughter, RN 11/20/2014 5:23 PM

## 2014-11-20 NOTE — Progress Notes (Signed)
ANTICOAGULATION CONSULT NOTE - Follow-Up Consult  Pharmacy Consult for Coumadin Indication: atrial fibrillation  No Known Allergies  Patient Measurements: Height: 5\' 3"  (160 cm) Weight: 195 lb 11.2 oz (88.769 kg) IBW/kg (Calculated) : 52.4  Vital Signs: Temp: 97.8 F (36.6 C) (09/23 0533) Temp Source: Oral (09/23 0533) BP: 144/69 mmHg (09/23 0954) Pulse Rate: 65 (09/23 0954)  Labs:  Recent Labs  11/18/14 1525  11/18/14 1816 11/19/14 0625 11/20/14 0606  HGB  --   < > 13.1 10.8* 10.6*  HCT  --   --  39.6 33.1* 32.7*  PLT  --   --  190 148* 154  LABPROT 37.4*  --   --  40.2* 35.9*  INR 3.92*  --   --  4.32* 3.71*  CREATININE  --   --  1.73* 1.42* 1.43*  < > = values in this interval not displayed.  Estimated Creatinine Clearance: 33.2 mL/min (by C-G formula based on Cr of 1.43).   Medical History: Past Medical History  Diagnosis Date  . Stroke 1999  . Paroxysmal atrial fibrillation     and atrial tachycardia  . Tachycardia-bradycardia syndrome     s/p PPM  . DM2 (diabetes mellitus, type 2)   . Hyperlipidemia   . HTN (hypertension)   . Microalbuminuria     last done in 1/07   . Hypothyroidism   . Esophageal reflux   . Diverticulosis   . Unspecified hypothyroidism 11/20/2013    Medications:  Prescriptions prior to admission  Medication Sig Dispense Refill Last Dose  . diltiazem (DILACOR XR) 180 MG 24 hr capsule Take 180 mg by mouth daily.   11/18/2014 at Unknown time  . FERGON 240 (27 FE) MG tablet Take 1 tablet by mouth 3 (three) times daily.  3 11/18/2014 at Unknown time  . furosemide (LASIX) 20 MG tablet Take 20 mg by mouth daily.   11/18/2014 at Unknown time  . glipiZIDE (GLUCOTROL) 10 MG tablet Take 10 mg by mouth 2 (two) times daily before a meal.   11/18/2014 at Unknown time  . levothyroxine (SYNTHROID, LEVOTHROID) 25 MCG tablet Take 25 mcg by mouth daily before breakfast.   11/18/2014 at Unknown time  . metFORMIN (GLUCOPHAGE) 1000 MG tablet Take 1,000 mg  by mouth 2 (two) times daily with a meal.   11/18/2014 at Unknown time  . metoprolol tartrate (LOPRESSOR) 25 MG tablet Take 25 mg by mouth 2 (two) times daily.   11/18/2014 at 0930  . potassium chloride SA (K-DUR,KLOR-CON) 20 MEQ tablet Take 20 mEq by mouth daily.   11/18/2014 at Unknown time  . ranitidine (ZANTAC) 150 MG capsule Take 150 mg by mouth every morning.    11/18/2014 at Unknown time  . rosuvastatin (CRESTOR) 10 MG tablet Take 10 mg by mouth daily.   11/18/2014 at Unknown time  . warfarin (COUMADIN) 2.5 MG tablet Take 0.5-1 tablets (1.25-2.5 mg total) by mouth daily. Resume on Monday 11/24/13.  2.5 mg daily except 1.25 mg on Wednesday   11/18/2014 at Unknown time    Assessment: 81 y.o. female presents with weakness and emesis. Pt on coumadin PTA for afib. Admit INR 3.92 (supratherapeutic). Home dose 2.5mg  daily except for 1.25mg  on Wed - last dose 9/21. CBC stable upon admission.  INR remains above goal.  Not surprising in the setting of decreased PO intake and Cipro/Flagyl.    Goal of Therapy:  INR 2-3 Monitor platelets by anticoagulation protocol: Yes   Plan:  Daily INR No coumadin  tonight  **If patient is discharged today, would recommend holing her Coumadin dose today and tomorrow and restarting on Sunday.  If she continues oral Cipro/Flagyl at discharge she will need close INR follow-up, preferably Monday or Tuesday.  [employer], Pharm.D., BCPS Clinical Pharmacist Pager [phone] 11/20/2014 11:07 AM

## 2014-12-02 ENCOUNTER — Encounter: Payer: Self-pay | Admitting: Internal Medicine

## 2014-12-02 ENCOUNTER — Ambulatory Visit (INDEPENDENT_AMBULATORY_CARE_PROVIDER_SITE_OTHER): Payer: Medicare Other | Admitting: Internal Medicine

## 2014-12-02 VITALS — BP 140/72 | HR 80 | Ht 63.0 in | Wt 187.8 lb

## 2014-12-02 DIAGNOSIS — I495 Sick sinus syndrome: Secondary | ICD-10-CM | POA: Diagnosis not present

## 2014-12-02 DIAGNOSIS — I480 Paroxysmal atrial fibrillation: Secondary | ICD-10-CM | POA: Diagnosis not present

## 2014-12-02 DIAGNOSIS — I10 Essential (primary) hypertension: Secondary | ICD-10-CM | POA: Diagnosis not present

## 2014-12-02 LAB — CUP PACEART INCLINIC DEVICE CHECK
Battery Remaining Longevity: 57 mo
Battery Voltage: 2.99 V
Brady Statistic AP VP Percent: 100 %
Brady Statistic AP VS Percent: 0 %
Brady Statistic AS VP Percent: 0 %
Brady Statistic AS VS Percent: 0 %
Brady Statistic RA Percent Paced: 100 %
Brady Statistic RV Percent Paced: 100 %
Date Time Interrogation Session: 20161005093144
Lead Channel Impedance Value: 266 Ohm
Lead Channel Impedance Value: 285 Ohm
Lead Channel Impedance Value: 323 Ohm
Lead Channel Impedance Value: 342 Ohm
Lead Channel Pacing Threshold Amplitude: 1 V
Lead Channel Pacing Threshold Amplitude: 1.125 V
Lead Channel Pacing Threshold Pulse Width: 0.4 ms
Lead Channel Pacing Threshold Pulse Width: 0.4 ms
Lead Channel Sensing Intrinsic Amplitude: 0.25 mV
Lead Channel Sensing Intrinsic Amplitude: 0.25 mV
Lead Channel Sensing Intrinsic Amplitude: 10.25 mV
Lead Channel Sensing Intrinsic Amplitude: 10.25 mV
Lead Channel Setting Pacing Amplitude: 2 V
Lead Channel Setting Pacing Amplitude: 2.5 V
Lead Channel Setting Pacing Pulse Width: 0.4 ms
Lead Channel Setting Sensing Sensitivity: 8 mV
Zone Setting Detection Interval: 400 ms
Zone Setting Detection Interval: 430 ms

## 2014-12-02 LAB — CBC WITH DIFFERENTIAL/PLATELET
Basophils Absolute: 0 10*3/uL (ref 0.0–0.1)
Basophils Relative: 0 % (ref 0–1)
Eosinophils Absolute: 1.4 10*3/uL — ABNORMAL HIGH (ref 0.0–0.7)
Eosinophils Relative: 20 % — ABNORMAL HIGH (ref 0–5)
HCT: 35.7 % — ABNORMAL LOW (ref 36.0–46.0)
Hemoglobin: 11.7 g/dL — ABNORMAL LOW (ref 12.0–15.0)
Lymphocytes Relative: 13 % (ref 12–46)
Lymphs Abs: 0.9 10*3/uL (ref 0.7–4.0)
MCH: 27.4 pg (ref 26.0–34.0)
MCHC: 32.8 g/dL (ref 30.0–36.0)
MCV: 83.6 fL (ref 78.0–100.0)
MPV: 8.7 fL (ref 8.6–12.4)
Monocytes Absolute: 0.4 10*3/uL (ref 0.1–1.0)
Monocytes Relative: 6 % (ref 3–12)
Neutro Abs: 4.2 10*3/uL (ref 1.7–7.7)
Neutrophils Relative %: 61 % (ref 43–77)
Platelets: 264 10*3/uL (ref 150–400)
RBC: 4.27 MIL/uL (ref 3.87–5.11)
RDW: 17.9 % — ABNORMAL HIGH (ref 11.5–15.5)
WBC: 6.9 10*3/uL (ref 4.0–10.5)

## 2014-12-02 LAB — BASIC METABOLIC PANEL
BUN: 24 mg/dL (ref 7–25)
CO2: 26 mmol/L (ref 20–31)
Calcium: 9.6 mg/dL (ref 8.6–10.4)
Chloride: 103 mmol/L (ref 98–110)
Creat: 1.31 mg/dL — ABNORMAL HIGH (ref 0.60–0.88)
Glucose, Bld: 183 mg/dL — ABNORMAL HIGH (ref 65–99)
Potassium: 4.9 mmol/L (ref 3.5–5.3)
Sodium: 139 mmol/L (ref 135–146)

## 2014-12-02 LAB — PROTIME-INR
INR: 3.32 — ABNORMAL HIGH (ref <1.50)
Prothrombin Time: 34.2 seconds — ABNORMAL HIGH (ref 11.6–15.2)

## 2014-12-02 MED ORDER — RIVAROXABAN 20 MG PO TABS: 20.0000 mg | ORAL_TABLET | Freq: Every day | ORAL | Status: DC

## 2014-12-02 NOTE — Patient Instructions (Addendum)
Medication Instructions:  1) Stop Coumadin 2) Start Xarelto once INR is less than 3.0---will call you with the dose of medication   Labwork: BMET, CBC, INR  Testing/Procedures: None ordered   Follow-Up: Remote monitoring is used to monitor your Pacemaker  from home. This monitoring reduces the number of office visits required to check your device to one time per year. It allows [place] to keep an eye on the functioning of your device to ensure it is working properly. You are scheduled for a device check from home on 03/03/15. You may send your transmission at any time that day. If you have a wireless device, the transmission will be sent automatically. After your physician reviews your transmission, you will receive a postcard with your next transmission date.   Your physician wants you to follow-up in: 4 weeks with Elberta Leatherwood, Pharm-D, 6 months with Truitt Merle, NP and 12 months with Dr Vallery Ridge will receive a reminder letter in the mail two months in advance. If you don't receive a letter, please call our office to schedule the follow-up appointment.    Any Other Special Instructions Will Be Listed Below (If Applicable).

## 2014-12-02 NOTE — Progress Notes (Signed)
The patient presents today for routine electrophysiology followup.  Since last being seen in our clinic, the patient reports doing very well.  She did have a GI virus which led to admission for dehydration in September.  She had very labile INRs at that time.  She is improving.  Mild cough for a couple days but otherwise back to baseline.  Today, she denies symptoms of palpitations, chest pain, shortness of breath,lower extremity edema, dizziness,syncope, or neurologic sequela.  The patient feels that she is tolerating medications without difficulties and is otherwise without complaint today.   Past Medical History  Diagnosis Date  . Stroke ([place]) 1999  . Paroxysmal atrial fibrillation ([facility])     and atrial tachycardia  . Tachycardia-bradycardia syndrome ([place])     s/p PPM  . DM2 (diabetes mellitus, type 2) ([place])   . Hyperlipidemia   . HTN (hypertension)   . Microalbuminuria     last done in 1/07   . Hypothyroidism   . Esophageal reflux   . Diverticulosis   . Unspecified hypothyroidism 11/20/2013   Past Surgical History  Procedure Laterality Date  . Hemorrhoid surgery    . Hysterectomy (other)    . Permanent pacemaker  1999    updated 2006 (MDT) by Dr Verlon Setting; gen change 09-2012 by Dr Rayann Heman (MDT)  . Cholecystectomy    . Pacemaker generator change  10/03/12    DMT Advisa DR gen change by Dr Rayann Heman  . Colonoscopy N/A 11/21/2013    Procedure: COLONOSCOPY;  Surgeon: Jeryl Columbia, MD;  Location: [facility] ENDOSCOPY;  Service: Endoscopy;  Laterality: N/A;  . Pacemaker generator change N/A 10/03/2012    Procedure: PACEMAKER GENERATOR CHANGE;  Surgeon: Thompson Grayer, MD;  Location: [facility] CATH LAB;  Service: Cardiovascular;  Laterality: N/A;    Current Outpatient Prescriptions  Medication Sig Dispense Refill  . diltiazem (DILACOR XR) 180 MG 24 hr capsule Take 180 mg by mouth daily.    . FERGON 240 (27 FE) MG tablet Take 1 tablet by mouth 3 (three) times daily.  3  . furosemide (LASIX) 20 MG tablet Take 20 mg  by mouth daily.    Marland Kitchen glipiZIDE (GLUCOTROL) 10 MG tablet Take 10 mg by mouth 2 (two) times daily before a meal.    . levothyroxine (SYNTHROID, LEVOTHROID) 25 MCG tablet Take 25 mcg by mouth daily before breakfast.    . metFORMIN (GLUCOPHAGE) 1000 MG tablet Take 1,000 mg by mouth 2 (two) times daily with a meal.    . metoprolol tartrate (LOPRESSOR) 25 MG tablet Take 25 mg by mouth 2 (two) times daily.    . ondansetron (ZOFRAN ODT) 4 MG disintegrating tablet Take 1 tablet (4 mg total) by mouth every 8 (eight) hours as needed for nausea or vomiting. 20 tablet 0  . potassium chloride SA (K-DUR,KLOR-CON) 20 MEQ tablet Take 20 mEq by mouth daily.    . ranitidine (ZANTAC) 150 MG capsule Take 150 mg by mouth every morning.     . rosuvastatin (CRESTOR) 10 MG tablet Take 10 mg by mouth daily.    Marland Kitchen warfarin (COUMADIN) 2.5 MG tablet Take 0.5-1 tablets (1.25-2.5 mg total) by mouth daily. Resume on Monday 11/23/14,   2.5 mg daily except 1.25 mg on Wednesday     No current facility-administered medications for this visit.    No Known Allergies  Social History   Social History  . Marital Status: Divorced    Spouse Name: N/A  . Number of Children: N/A  . Years  of Education: N/A   Occupational History  . Not on file.   Social History Main Topics  . Smoking status: Never Smoker   . Smokeless tobacco: Never Used  . Alcohol Use: No  . Drug Use: No  . Sexual Activity: No   Other Topics Concern  . Not on file   Social History Narrative   Lives in [place]. 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 Denies TED.    Physical Exam: Filed Vitals:   12/02/14 0915  BP: 140/72  Pulse: 80  Height: 5\' 3"  (1.6 m)  Weight: 187 lb 12.8 oz (85.186 kg)  SpO2: 95%    GEN- The patient is elderly, overweight appearing, alert and oriented x 3 today.   Head- normocephalic, atraumatic Eyes-  Sclera clear, conjunctiva pink Ears- hearing intact Oropharynx- clear Neck- supple, no  JVP Lymph- no cervical lymphadenopathy Lungs- Clear to ausculation bilaterally, normal work of breathing Chest- pacemaker pocket is well healed Heart- tachycardic Regular rate and rhythm, no murmurs, rubs or gallops, PMI not laterally displaced GI- soft, NT, ND, + BS Extremities- no clubbing, cyanosis, or edema  Pacemaker interrogation- reviewed in detail today,  See PACEART report  Assessment and Plan:  1. Paroxysmal atrial fibrillation Stable No change required today Recently elevated INRs noted chads2vasc score is at least 7. This patients CHA2DS2-VASc Score and unadjusted Ischemic Stroke Rate (% per year) is equal to 11.2 % stroke rate/year from a score of 7   Today, I discussed Coumadin as well as novel anticoagulants including Pradaxa, Xarelto, and Eliquis today as indicated for risk reduction in stroke and systemic emboli with nonvalvular atrial fibrillation.  Risks, benefits, and alternatives to each of these drugs were discussed at length today.  She is interested in switching to xarelto for daily dosing. I will check bmet, cbc and INR today to determine the dose She will switch from coumadin to xarelto. Follow-up in the anticoagulation clinic in 4 weeks  2. Tachycardia/ bradycardia syndrome Normal pacemaker function See Pace Art report No changes today  3. HTN Stable No change required today   [facility] every 3 months Follow-up with Cecille Rubin in 6 months Return to see me in 1 year

## 2014-12-04 ENCOUNTER — Telehealth: Payer: Self-pay | Admitting: *Deleted

## 2014-12-04 ENCOUNTER — Telehealth: Payer: Self-pay | Admitting: Internal Medicine

## 2014-12-04 MED ORDER — RIVAROXABAN 15 MG PO TABS: 15.0000 mg | ORAL_TABLET | Freq: Every day | ORAL | Status: DC

## 2014-12-04 NOTE — Telephone Encounter (Signed)
Patient called and stated that she was supposed to be starting xarelto. Per note from 12/02/14 Medication Instructions:  1) Stop Coumadin 2) Start Xarelto once INR is less than 3.0---will call you with the dose of medication Please advise. Thanks, MI

## 2014-12-04 NOTE — Telephone Encounter (Signed)
She has continued to take her Coumadin and will begin to hold today.  Spoke with [facility] clinic in regards to her INR.  If she holds starting today then she should be able to start her Xarelto on Sun.  Her CrCl is 45.9 therefore she will need to be started on Xarelto 15 mg daily.  I have called this in for the patient and she is aware

## 2014-12-04 NOTE — Telephone Encounter (Signed)
She has continued to take her Coumadin and will begin to hold today. Spoke with [facility] clinic in regards to her INR. If she holds starting today then she should be able to start her Xarelto on Sun. Her CrCl is 45.9 therefore she will need to be started on Xarelto 15 mg daily. I have called this in for the patient and she is aware   Spoke with [facility] and she is going to go back in to the patient's house and make sure she understands directions

## 2014-12-04 NOTE — Telephone Encounter (Signed)
New message      Please call regarding coumadin and xarelto use.  She need her instructions to make sure pt is taking correct medication

## 2014-12-09 ENCOUNTER — Encounter: Payer: Medicare Other | Admitting: Internal Medicine

## 2014-12-30 ENCOUNTER — Ambulatory Visit: Payer: Medicare Other | Admitting: Pharmacist

## 2014-12-30 ENCOUNTER — Ambulatory Visit (INDEPENDENT_AMBULATORY_CARE_PROVIDER_SITE_OTHER): Payer: Medicare Other | Admitting: *Deleted

## 2014-12-30 DIAGNOSIS — I4891 Unspecified atrial fibrillation: Secondary | ICD-10-CM | POA: Diagnosis not present

## 2014-12-30 LAB — CBC
HCT: 32.7 % — ABNORMAL LOW (ref 36.0–46.0)
Hemoglobin: 10.9 g/dL — ABNORMAL LOW (ref 12.0–15.0)
MCH: 27.9 pg (ref 26.0–34.0)
MCHC: 33.3 g/dL (ref 30.0–36.0)
MCV: 83.6 fL (ref 78.0–100.0)
MPV: 9.3 fL (ref 8.6–12.4)
Platelets: 174 10*3/uL (ref 150–400)
RBC: 3.91 MIL/uL (ref 3.87–5.11)
RDW: 17.1 % — ABNORMAL HIGH (ref 11.5–15.5)
WBC: 6.7 10*3/uL (ref 4.0–10.5)

## 2014-12-30 LAB — BASIC METABOLIC PANEL
BUN: 25 mg/dL (ref 7–25)
CO2: 24 mmol/L (ref 20–31)
Calcium: 9.4 mg/dL (ref 8.6–10.4)
Chloride: 102 mmol/L (ref 98–110)
Creat: 1.31 mg/dL — ABNORMAL HIGH (ref 0.60–0.88)
Glucose, Bld: 270 mg/dL — ABNORMAL HIGH (ref 65–99)
Potassium: 4.6 mmol/L (ref 3.5–5.3)
Sodium: 138 mmol/L (ref 135–146)

## 2014-12-30 NOTE — Progress Notes (Signed)
Pt was started on Xarelto 15mg  daily for Afib on 12/04/14 by Dr. Rayann Heman.    Reviewed patients medication list.  Pt  is not currently on any combined P-gp and strong CYP3A4 inhibitors/inducers (ketoconazole, traconazole, ritonavir, carbamazepine, phenytoin, rifampin, St. John's wort).  Reviewed labs: SCr-1.31, Hgb-10.9, HCT-32.7, Weight-85kg, CrCl-45.96.  Dose appropiate based on CrCl and dosing criteria.      A full discussion of the nature of anticoagulants has been carried out.  A benefit/risk analysis has been presented to the patient, so that they understand the justification for choosing anticoagulation with Xarelto at this time.  The need for compliance is stressed.  Pt is aware to take the medication once daily with the largest meal of the day.  Side effects of potential bleeding are discussed, including unusual colored urine or stools, coughing up blood or coffee ground emesis, nose bleeds or serious fall or head trauma.  Discussed signs and symptoms of stroke. The patient should avoid any OTC items containing aspirin or ibuprofen.  Avoid alcohol consumption.   Call if any signs of abnormal bleeding.  Discussed financial obligations and resolved any difficulty in obtaining medication.  Next lab test test in 6 months.   Patient and her grandson present at the visit and advised to call Roselines-the patient's daughter with lab results and follow-up appointment.  On 12/30/14-Spoke with patient and advised to continue taking Xarelto 15mg  daily and to report any issues with the medication and she verbalized understanding. Also, called the Rose-daughter and advised to have patient continue taking Xarelto 15mg  daily and made appointment for 5 months from today (Sally-Pharmacist-approved).

## 2015-03-03 ENCOUNTER — Ambulatory Visit (INDEPENDENT_AMBULATORY_CARE_PROVIDER_SITE_OTHER): Payer: Medicare Other | Admitting: *Deleted

## 2015-03-03 ENCOUNTER — Telehealth: Payer: Self-pay | Admitting: Cardiology

## 2015-03-03 DIAGNOSIS — I495 Sick sinus syndrome: Secondary | ICD-10-CM | POA: Diagnosis not present

## 2015-03-03 NOTE — Progress Notes (Signed)
Remote pacemaker transmission.   

## 2015-03-03 NOTE — Telephone Encounter (Signed)
LMOVM reminding pt to send remote transmission.   

## 2015-03-14 LAB — CUP PACEART REMOTE DEVICE CHECK
Battery Remaining Longevity: 54 mo
Battery Voltage: 2.99 V
Brady Statistic AP VP Percent: 99.99 %
Brady Statistic AP VS Percent: 0 %
Brady Statistic AS VP Percent: 0 %
Brady Statistic AS VS Percent: 0 %
Brady Statistic RA Percent Paced: 100 %
Brady Statistic RV Percent Paced: 100 %
Date Time Interrogation Session: 20170104180857
Implantable Lead Implant Date: 19990907
Implantable Lead Implant Date: 19990907
Implantable Lead Location: 753859
Implantable Lead Location: 753860
Implantable Lead Serial Number: 32904
Implantable Lead Serial Number: 33421
Lead Channel Impedance Value: 266 Ohm
Lead Channel Impedance Value: 266 Ohm
Lead Channel Impedance Value: 323 Ohm
Lead Channel Impedance Value: 323 Ohm
Lead Channel Pacing Threshold Amplitude: 1 V
Lead Channel Pacing Threshold Amplitude: 1.125 V
Lead Channel Pacing Threshold Pulse Width: 0.4 ms
Lead Channel Pacing Threshold Pulse Width: 0.4 ms
Lead Channel Sensing Intrinsic Amplitude: 0.25 mV
Lead Channel Sensing Intrinsic Amplitude: 0.25 mV
Lead Channel Sensing Intrinsic Amplitude: 10.625 mV
Lead Channel Sensing Intrinsic Amplitude: 10.625 mV
Lead Channel Setting Pacing Amplitude: 2 V
Lead Channel Setting Pacing Amplitude: 2.5 V
Lead Channel Setting Pacing Pulse Width: 0.4 ms
Lead Channel Setting Sensing Sensitivity: 8 mV

## 2015-03-17 ENCOUNTER — Encounter: Payer: Self-pay | Admitting: Cardiology

## 2015-03-17 DIAGNOSIS — G479 Sleep disorder, unspecified: Secondary | ICD-10-CM | POA: Diagnosis not present

## 2015-03-17 DIAGNOSIS — I10 Essential (primary) hypertension: Secondary | ICD-10-CM | POA: Diagnosis not present

## 2015-03-17 DIAGNOSIS — E119 Type 2 diabetes mellitus without complications: Secondary | ICD-10-CM | POA: Diagnosis not present

## 2015-03-17 DIAGNOSIS — K219 Gastro-esophageal reflux disease without esophagitis: Secondary | ICD-10-CM | POA: Diagnosis not present

## 2015-04-05 ENCOUNTER — Telehealth: Payer: Self-pay | Admitting: Internal Medicine

## 2015-04-05 NOTE — Telephone Encounter (Signed)
Spoke with patient and have asked her to follow up wit Dr Marlou Sa in regards to her fluctuating blood sugars.  She will do so.

## 2015-04-05 NOTE — Telephone Encounter (Signed)
New message   Pt c/o medication issue:  1. Name of Medication: xarelto  15 mg   2. How are you currently taking this medication (dosage and times per day)? In the pm   3. Are you having a reaction (difficulty breathing--STAT)? No   4. What is your medication issue? Blood sugar is high 194 this am

## 2015-04-19 DIAGNOSIS — R791 Abnormal coagulation profile: Secondary | ICD-10-CM | POA: Diagnosis not present

## 2015-04-28 DIAGNOSIS — R791 Abnormal coagulation profile: Secondary | ICD-10-CM | POA: Diagnosis not present

## 2015-05-10 DIAGNOSIS — Z7901 Long term (current) use of anticoagulants: Secondary | ICD-10-CM | POA: Diagnosis not present

## 2015-05-21 DIAGNOSIS — D3132 Benign neoplasm of left choroid: Secondary | ICD-10-CM | POA: Diagnosis not present

## 2015-05-21 DIAGNOSIS — E113391 Type 2 diabetes mellitus with moderate nonproliferative diabetic retinopathy without macular edema, right eye: Secondary | ICD-10-CM | POA: Diagnosis not present

## 2015-05-26 DIAGNOSIS — R791 Abnormal coagulation profile: Secondary | ICD-10-CM | POA: Diagnosis not present

## 2015-06-02 ENCOUNTER — Ambulatory Visit (INDEPENDENT_AMBULATORY_CARE_PROVIDER_SITE_OTHER): Payer: Medicare Other | Admitting: *Deleted

## 2015-06-02 DIAGNOSIS — E119 Type 2 diabetes mellitus without complications: Secondary | ICD-10-CM | POA: Diagnosis not present

## 2015-06-02 DIAGNOSIS — I495 Sick sinus syndrome: Secondary | ICD-10-CM | POA: Diagnosis not present

## 2015-06-02 DIAGNOSIS — I10 Essential (primary) hypertension: Secondary | ICD-10-CM | POA: Diagnosis not present

## 2015-06-02 DIAGNOSIS — D509 Iron deficiency anemia, unspecified: Secondary | ICD-10-CM | POA: Diagnosis not present

## 2015-06-02 DIAGNOSIS — K5730 Diverticulosis of large intestine without perforation or abscess without bleeding: Secondary | ICD-10-CM | POA: Diagnosis not present

## 2015-06-03 ENCOUNTER — Telehealth: Payer: Self-pay | Admitting: Internal Medicine

## 2015-06-03 NOTE — Telephone Encounter (Signed)
Spoke w/ pt and informed her that we did receive her remote transmission. Pt verbalized understanding.  

## 2015-06-03 NOTE — Telephone Encounter (Signed)
New Message  4. Are you calling to see if we received your device transmission? Yes from 06/02/15

## 2015-06-03 NOTE — Progress Notes (Signed)
Remote pacemaker transmission.   

## 2015-06-03 NOTE — Telephone Encounter (Signed)
Informed pt that we did not receive her remote transmission on 06-02-15 but she can re send it today. Pt verbalized understanding.

## 2015-06-03 NOTE — Telephone Encounter (Signed)
New Message  Pt requested to speak w/ Pamala Hurry. Please call back and discuss.

## 2015-06-08 ENCOUNTER — Encounter: Payer: Self-pay | Admitting: Nurse Practitioner

## 2015-06-08 ENCOUNTER — Ambulatory Visit (INDEPENDENT_AMBULATORY_CARE_PROVIDER_SITE_OTHER): Payer: Medicare Other | Admitting: Nurse Practitioner

## 2015-06-08 ENCOUNTER — Encounter: Payer: Medicare Other | Admitting: Pharmacist

## 2015-06-08 VITALS — BP 142/68 | HR 86 | Ht 63.0 in | Wt 187.8 lb

## 2015-06-08 DIAGNOSIS — I480 Paroxysmal atrial fibrillation: Secondary | ICD-10-CM

## 2015-06-08 DIAGNOSIS — Z950 Presence of cardiac pacemaker: Secondary | ICD-10-CM | POA: Diagnosis not present

## 2015-06-08 DIAGNOSIS — I10 Essential (primary) hypertension: Secondary | ICD-10-CM | POA: Diagnosis not present

## 2015-06-08 DIAGNOSIS — I495 Sick sinus syndrome: Secondary | ICD-10-CM | POA: Diagnosis not present

## 2015-06-08 DIAGNOSIS — Z7901 Long term (current) use of anticoagulants: Secondary | ICD-10-CM

## 2015-06-08 NOTE — Progress Notes (Signed)
This encounter was created in error - please disregard.

## 2015-06-08 NOTE — Progress Notes (Signed)
CARDIOLOGY OFFICE NOTE  Date:  06/08/2015    [place] Date of Birth: 04/26/1931 Medical Record [id number]  PCP:  Rogers Blocker, MD  Cardiologist:  Allred    Chief Complaint  Patient presents with  . Atrial Fibrillation  . Hypertension  . Hyperlipidemia    6 month check - seen for Dr. Rayann Heman    History of Present Illness: Tiffany [name] is a 82 y.o. female who presents today for a 6 month check. Seen for Dr. Rayann Heman.   She has a history of PAF, CHADSVASC at least 7 - she is on anticoagulation - switched to Xarelto from her Coumadin at last visit. Other issues include tachybrady and she has PPM in place, HTN, HLD, DM and prior stroke.   Comes in today. Here alone. She is back on Coumadin - says the Xarelto did not agree with her - kept her up at night and had more nose bleeding - so she switched back to Coumadin after talking with Dr. Marlou Sa. Her coumadin is monitored by Dr. Marlou Sa. Says she is doing ok. No chest pain. Breathing is fair - does get short of breath with much exertion. She tries to at least walk to her mailbox each day. This seems stable. No passing out spells. She has no real concerns today.   Past Medical History  Diagnosis Date  . Stroke ([place]) 1999  . Paroxysmal atrial fibrillation (H[facility])     and atrial tachycardia  . Tachycardia-bradycardia syndrome ([place])     s/p PPM  . DM2 (diabetes mellitus, type 2) ([place])   . Hyperlipidemia   . HTN (hypertension)   . Microalbuminuria     last done in 1/07   . Hypothyroidism   . Esophageal reflux   . Diverticulosis   . Unspecified hypothyroidism 11/20/2013    Past Surgical History  Procedure Laterality Date  . Hemorrhoid surgery    . Hysterectomy (other)    . Permanent pacemaker  1999    updated 2006 (MDT) by Dr Verlon Setting; gen change 09-2012 by Dr Rayann Heman (MDT)  . Cholecystectomy    . Pacemaker generator change  10/03/12    DMT Advisa DR gen change by Dr Rayann Heman  . Colonoscopy N/A 11/21/2013   Procedure: COLONOSCOPY;  Surgeon: Jeryl Columbia, MD;  [facility] END[facility]COPY;  Service: Endoscopy;  Laterality: N/A;  . Pacemaker generator change N/A 10/03/2012    Procedure: PACEMAKER GENERATOR CHANGE;  Surgeon: Thompson Grayer, MD;  Location: [facility] CATH LAB;  Service: Cardiovascular;  Laterality: N/A;     Medications: Current Outpatient Prescriptions  Medication Sig Dispense Refill  . diltiazem (DILACOR XR) 180 MG 24 hr capsule Take 180 mg by mouth daily.    . FERGON 240 (27 FE) MG tablet Take 1 tablet by mouth 3 (three) times daily.  3  . furosemide (LASIX) 20 MG tablet Take 20 mg by mouth daily.    Marland Kitchen glipiZIDE (GLUCOTROL) 10 MG tablet Take 10 mg by mouth 2 (two) times daily before a meal.    . levothyroxine (SYNTHROID, LEVOTHROID) 25 MCG tablet Take 25 mcg by mouth daily before breakfast.    . metFORMIN (GLUCOPHAGE) 1000 MG tablet Take 1,000 mg by mouth 2 (two) times daily with a meal.    . metoprolol tartrate (LOPRESSOR) 25 MG tablet Take 25 mg by mouth 2 (two) times daily.    . potassium chloride SA (K-DUR,KLOR-CON) 20 MEQ tablet Take 20 mEq by mouth daily.    . ranitidine (  ZANTAC) 150 MG capsule Take 150 mg by mouth every morning.     . rosuvastatin (CRESTOR) 10 MG tablet Take 10 mg by mouth daily.    Marland Kitchen warfarin (COUMADIN) 2.5 MG tablet Take 2.5 mg by mouth daily at 6 PM.      No current facility-administered medications for this visit.    Allergies: No Known Allergies  Social History: The patient  reports that she has never smoked. She has never used smokeless tobacco. She reports that she does not drink alcohol or use illicit drugs.   Family History: The patient's family history includes Diabetes in her father; Tuberculosis in her mother.   Review of Systems: Please see the history of present illness.   Otherwise, the review of systems is positive for none.   All other systems are reviewed and negative.   Physical Exam: VS:  BP 142/68 mmHg  Pulse 86  Ht 5\' 3"  (1.6 m)  Wt 187  lb 12.8 oz (85.186 kg)  BMI 33.28 kg/m2 .  BMI Body mass index is 33.28 kg/(m^2).  Wt Readings from Last 3 Encounters:  06/08/15 187 lb 12.8 oz (85.186 kg)  12/02/14 187 lb 12.8 oz (85.186 kg)  11/19/14 195 lb 11.2 oz (88.769 kg)    General: Pleasant. Well developed, well nourished and in no acute distress.  HEENT: Normal. Neck: Supple, no JVD, carotid bruits, or masses noted.  Cardiac: Regular rate and rhythm. No murmurs, rubs, or gallops. No edema.  Respiratory:  Lungs are clear to auscultation bilaterally with normal work of breathing.  GI: Soft and nontender.  MS: No deformity or atrophy. Gait and ROM intact. Skin: Warm and dry. Color is normal.  Neuro:  Strength and sensation are intact and no gross focal deficits noted.  Psych: Alert, appropriate and with normal affect.   LABORATORY DATA:  EKG:  EKG is not ordered today.  Lab Results  Component Value Date   WBC 6.7 12/30/2014   HGB 10.9* 12/30/2014   HCT 32.7* 12/30/2014   PLT 174 12/30/2014   GLUCOSE 270* 12/30/2014   CHOL 101 05/04/2011   TRIG 118 05/04/2011   HDL 45 05/04/2011   LDLDIRECT 45 08/31/2009   LDLCALC 32 05/04/2011   ALT 17 11/19/2014   AST 18 11/19/2014   NA 138 12/30/2014   K 4.6 12/30/2014   CL 102 12/30/2014   CREATININE 1.31* 12/30/2014   BUN 25 12/30/2014   CO2 24 12/30/2014   TSH 2.55 05/04/2011   INR 3.32* 12/02/2014   HGBA1C 7.6* 11/20/2013   MICROALBUR 0.51 06/08/2009   Lab Results  Component Value Date   INR 3.32* 12/02/2014   INR 3.71* 11/20/2014   INR 4.32* 11/19/2014     BNP (last 3 results) No results for input(s): BNP in the last 8760 hours.  ProBNP (last 3 results) No results for input(s): PROBNP in the last 8760 hours.   Other Studies Reviewed Today:  Echo Study Conclusions from 10/2014  - Left ventricle: The cavity size was normal. Wall thickness was  increased in a pattern of moderate LVH. Systolic function was  normal. The estimated ejection fraction was  in the range of 50%  to 55%. Wall motion was normal; there were no regional wall  motion abnormalities. - Aortic valve: There was mild regurgitation. - Left atrium: The atrium was mildly to moderately dilated. - Right atrium: The atrium was mildly to moderately dilated. - Pulmonary arteries: Systolic pressure was mildly increased. PA  peak pressure: 35 mm  Hg (S).  Assessment/Plan: 1. Paroxysmal atrial fibrillation - she remains on anticoagulation - she is back on Coumadin. Labs and INR by PCP.   2. Tachycardia/ bradycardia syndrome with PPM in place. Followed by Dr. Rayann Heman.   3. HTN - stable with her current regimen.   Current medicines are reviewed with the patient today.  The patient does not have concerns regarding medicines other than what has been noted above.  The following changes have been made:  See above.  Labs/ tests ordered today include:   No orders of the defined types were placed in this encounter.     Disposition:   FU with Dr. Rayann Heman and his team going forward.  Patient is agreeable to this plan and will call if any problems develop in the interim.   Signed: Burtis Junes, RN, ANP-C 06/08/2015 9:51 AM  [facility] [address] [place], [place]  [address] Phone: [phone] Fax: [phone]

## 2015-06-08 NOTE — Patient Instructions (Addendum)
We will be checking the following labs today - NONE   Medication Instructions:    Continue with your current medicines.     Testing/Procedures To Be Arranged:  N/A  Follow-Up:   See Dr. Rayann Heman and his team going forward in 95months.     Other Special Instructions:   Try to be as active as you can - keep moving!!    If you need a refill on your cardiac medications before your next appointment, please call your pharmacy.   Call the [place] office at [phone] if you have any questions, problems or concerns.

## 2015-06-09 DIAGNOSIS — D509 Iron deficiency anemia, unspecified: Secondary | ICD-10-CM | POA: Diagnosis not present

## 2015-07-14 DIAGNOSIS — D509 Iron deficiency anemia, unspecified: Secondary | ICD-10-CM | POA: Diagnosis not present

## 2015-07-16 LAB — CUP PACEART REMOTE DEVICE CHECK
Battery Remaining Longevity: 48 mo
Battery Voltage: 2.99 V
Brady Statistic AP VP Percent: 99.96 %
Brady Statistic AP VS Percent: 0.04 %
Brady Statistic AS VP Percent: 0 %
Brady Statistic AS VS Percent: 0 %
Brady Statistic RA Percent Paced: 100 %
Brady Statistic RV Percent Paced: 99.96 %
Date Time Interrogation Session: 20170406151436
Implantable Lead Implant Date: 19990907
Implantable Lead Implant Date: 19990907
Implantable Lead Location: 753859
Implantable Lead Location: 753860
Implantable Lead Serial Number: 32904
Implantable Lead Serial Number: 33421
Lead Channel Impedance Value: 266 Ohm
Lead Channel Impedance Value: 266 Ohm
Lead Channel Impedance Value: 304 Ohm
Lead Channel Impedance Value: 304 Ohm
Lead Channel Pacing Threshold Amplitude: 1 V
Lead Channel Pacing Threshold Amplitude: 1.125 V
Lead Channel Pacing Threshold Pulse Width: 0.4 ms
Lead Channel Pacing Threshold Pulse Width: 0.4 ms
Lead Channel Sensing Intrinsic Amplitude: 0.25 mV
Lead Channel Sensing Intrinsic Amplitude: 0.25 mV
Lead Channel Sensing Intrinsic Amplitude: 10.375 mV
Lead Channel Sensing Intrinsic Amplitude: 10.375 mV
Lead Channel Setting Pacing Amplitude: 2 V
Lead Channel Setting Pacing Amplitude: 2.5 V
Lead Channel Setting Pacing Pulse Width: 0.4 ms
Lead Channel Setting Sensing Sensitivity: 8 mV

## 2015-07-20 ENCOUNTER — Encounter: Payer: Self-pay | Admitting: Cardiology

## 2015-08-04 DIAGNOSIS — R791 Abnormal coagulation profile: Secondary | ICD-10-CM | POA: Diagnosis not present

## 2015-09-01 ENCOUNTER — Telehealth: Payer: Self-pay | Admitting: Internal Medicine

## 2015-09-01 ENCOUNTER — Ambulatory Visit (INDEPENDENT_AMBULATORY_CARE_PROVIDER_SITE_OTHER): Payer: Medicare Other | Admitting: *Deleted

## 2015-09-01 DIAGNOSIS — I495 Sick sinus syndrome: Secondary | ICD-10-CM

## 2015-09-01 NOTE — Telephone Encounter (Signed)
New Message  Pt wants to confirm if the remote was received. Please confirm and call back.

## 2015-09-01 NOTE — Telephone Encounter (Signed)
Called pt back and informed her that the remote transmission was received.

## 2015-09-01 NOTE — Progress Notes (Signed)
Remote pacemaker transmission.   

## 2015-09-03 LAB — CUP PACEART REMOTE DEVICE CHECK
Battery Remaining Longevity: 45 mo
Battery Voltage: 2.99 V
Brady Statistic AP VP Percent: 100 %
Brady Statistic AP VS Percent: 0 %
Brady Statistic AS VP Percent: 0 %
Brady Statistic AS VS Percent: 0 %
Brady Statistic RA Percent Paced: 100 %
Brady Statistic RV Percent Paced: 100 %
Date Time Interrogation Session: 20170705142535
Implantable Lead Implant Date: 19990907
Implantable Lead Implant Date: 19990907
Implantable Lead Location: 753859
Implantable Lead Location: 753860
Implantable Lead Serial Number: 32904
Implantable Lead Serial Number: 33421
Lead Channel Impedance Value: 266 Ohm
Lead Channel Impedance Value: 266 Ohm
Lead Channel Impedance Value: 304 Ohm
Lead Channel Impedance Value: 304 Ohm
Lead Channel Pacing Threshold Amplitude: 1 V
Lead Channel Pacing Threshold Amplitude: 1 V
Lead Channel Pacing Threshold Pulse Width: 0.4 ms
Lead Channel Pacing Threshold Pulse Width: 0.4 ms
Lead Channel Sensing Intrinsic Amplitude: 0.25 mV
Lead Channel Sensing Intrinsic Amplitude: 0.25 mV
Lead Channel Sensing Intrinsic Amplitude: 10.375 mV
Lead Channel Sensing Intrinsic Amplitude: 10.375 mV
Lead Channel Setting Pacing Amplitude: 2 V
Lead Channel Setting Pacing Amplitude: 2.5 V
Lead Channel Setting Pacing Pulse Width: 0.4 ms
Lead Channel Setting Sensing Sensitivity: 8 mV

## 2015-09-08 ENCOUNTER — Encounter: Payer: Self-pay | Admitting: Cardiology

## 2015-09-10 DIAGNOSIS — I10 Essential (primary) hypertension: Secondary | ICD-10-CM | POA: Diagnosis not present

## 2015-09-10 DIAGNOSIS — E039 Hypothyroidism, unspecified: Secondary | ICD-10-CM | POA: Diagnosis not present

## 2015-09-10 DIAGNOSIS — E119 Type 2 diabetes mellitus without complications: Secondary | ICD-10-CM | POA: Diagnosis not present

## 2015-09-10 DIAGNOSIS — R0609 Other forms of dyspnea: Secondary | ICD-10-CM | POA: Diagnosis not present

## 2015-11-19 ENCOUNTER — Encounter: Payer: Self-pay | Admitting: Internal Medicine

## 2015-12-06 ENCOUNTER — Encounter: Payer: Medicare Other | Admitting: Internal Medicine

## 2015-12-08 ENCOUNTER — Encounter: Payer: Medicare Other | Admitting: Internal Medicine

## 2015-12-09 ENCOUNTER — Other Ambulatory Visit: Payer: Self-pay

## 2015-12-09 ENCOUNTER — Encounter: Payer: Self-pay | Admitting: Internal Medicine

## 2015-12-09 ENCOUNTER — Ambulatory Visit (INDEPENDENT_AMBULATORY_CARE_PROVIDER_SITE_OTHER): Payer: Medicare Other | Admitting: Internal Medicine

## 2015-12-09 VITALS — BP 130/62 | HR 82 | Ht 62.0 in | Wt 191.6 lb

## 2015-12-09 DIAGNOSIS — I495 Sick sinus syndrome: Secondary | ICD-10-CM

## 2015-12-09 DIAGNOSIS — I480 Paroxysmal atrial fibrillation: Secondary | ICD-10-CM | POA: Diagnosis not present

## 2015-12-09 DIAGNOSIS — Z950 Presence of cardiac pacemaker: Secondary | ICD-10-CM

## 2015-12-09 NOTE — Progress Notes (Signed)
PCP: Rogers Blocker, MD   The patient presents today for routine electrophysiology followup.  Since last being seen in our clinic, the patient reports doing very well.  She has chronic cough and SOB which are stable.  She had an inhaler prescribed by PCP with some improvement.  Today, she denies symptoms of palpitations, chest pain, lower extremity edema, dizziness,syncope, or neurologic sequela.  The patient feels that she is tolerating medications without difficulties and is otherwise without complaint today.   Past Medical History:  Diagnosis Date  . Diverticulosis   . DM2 (diabetes mellitus, type 2) ([place])   . Esophageal reflux   . HTN (hypertension)   . Hyperlipidemia   . Hypothyroidism   . Microalbuminuria    last done in 1/07   . Paroxysmal atrial fibrillation (HCC)    and atrial tachycardia  . Stroke ([place]) 1999  . Tachycardia-bradycardia syndrome ([place])    s/p PPM  . Unspecified hypothyroidism 11/20/2013   Past Surgical History:  Procedure Laterality Date  . CHOLECYSTECTOMY    . COLONOSCOPY N/A 11/21/2013   Procedure: COLONOSCOPY;  Surgeon: Jeryl Columbia, MD;  Location: [facility] ENDOSCOPY;  Service: Endoscopy;  Laterality: N/A;  . HEMORRHOID SURGERY    . hysterectomy (other)    . PACEMAKER GENERATOR CHANGE  10/03/12   DMT Advisa DR gen change by Dr Rayann Heman  . PACEMAKER GENERATOR CHANGE N/A 10/03/2012   Procedure: PACEMAKER GENERATOR CHANGE;  Surgeon: Thompson Grayer, MD;  Location: [facility] CATH LAB;  Service: Cardiovascular;  Laterality: N/A;  . permanent pacemaker  1999   updated 2006 (MDT) by Dr Verlon Setting; gen change 09-2012 by Dr Rayann Heman (MDT)    Current Outpatient Prescriptions  Medication Sig Dispense Refill  . diltiazem (DILACOR XR) 180 MG 24 hr capsule Take 180 mg by mouth daily.    . FERGON 240 (27 FE) MG tablet Take 1 tablet by mouth 3 (three) times daily.  3  . furosemide (LASIX) 20 MG tablet Take 20 mg by mouth daily.    Marland Kitchen glipiZIDE (GLUCOTROL) 10 MG tablet Take 10 mg by mouth 2  (two) times daily before a meal.    . levothyroxine (SYNTHROID, LEVOTHROID) 25 MCG tablet Take 25 mcg by mouth daily before breakfast.    . metFORMIN (GLUCOPHAGE) 1000 MG tablet Take 1,000 mg by mouth 2 (two) times daily with a meal.    . metoprolol tartrate (LOPRESSOR) 25 MG tablet Take 25 mg by mouth 2 (two) times daily.    . potassium chloride SA (K-DUR,KLOR-CON) 20 MEQ tablet Take 20 mEq by mouth daily.    . ranitidine (ZANTAC) 150 MG capsule Take 150 mg by mouth every morning.     . rosuvastatin (CRESTOR) 10 MG tablet Take 10 mg by mouth daily.    Marland Kitchen warfarin (COUMADIN) 2.5 MG tablet Take 2.5 mg by mouth daily at 6 PM.     . XOPENEX HFA 45 MCG/ACT inhaler Inhale 2 puffs into the lungs daily as needed for shortness of breath or wheezing.     No current facility-administered medications for this visit.     No Known Allergies  Social History   Social History  . Marital status: Divorced    Spouse name: N/A  . Number of children: N/A  . Years of education: N/A   Occupational History  . Not on file.   Social History Main Topics  . Smoking status: Never Smoker  . Smokeless tobacco: Never Used  . Alcohol use No  . Drug  use: No  . Sexual activity: No   Other Topics Concern  . Not on file   Social History Narrative   Lives in [place]. 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 Denies TED.    Physical Exam: Vitals:   12/09/15 1333  BP: 130/62  Pulse: 82  Weight: 191 lb 9.6 oz (86.9 kg)  Height: 5\' 2"  (1.575 m)    GEN- The patient is elderly, overweight appearing, alert and oriented x 3 today.   Head- normocephalic, atraumatic Eyes-  Sclera clear, conjunctiva pink Ears- hearing intact Oropharynx- clear Neck- supple,  Lungs- Coarse BS, normal work of breathing Chest- pacemaker pocket is well healed Heart- tachycardic Regular rate and rhythm, no murmurs, rubs or gallops, PMI not laterally displaced GI- soft, NT, ND, + BS Extremities- no  clubbing, cyanosis, or edema  Pacemaker interrogation- reviewed in detail today,  See PACEART report  Assessment and Plan:  1. Paroxysmal atrial fibrillation No afib in past year She tried xarelto but did not tolerate it.  She prefers to be back on coumadin which is followed by Dr Marlou Sa.  2. Tachycardia/ bradycardia syndrome Normal pacemaker function See Pace Art report No changes today  3. HTN Stable No change required today  Carelink every 3 months Return to see me in 1 year  Thompson Grayer MD, [facility] 12/09/2015 2:06 PM

## 2015-12-09 NOTE — Patient Instructions (Signed)
Medication Instructions:  Your physician recommends that you continue on your current medications as directed. Please refer to the Current Medication list given to you today.   Labwork: None ordered   Testing/Procedures: None ordered   Follow-Up: Your physician wants you to follow-up in: 12 months with Dr. Rayann Heman.  You will receive a reminder letter in the mail two months in advance. If you don't receive a letter, please call our office to schedule the follow-up appointment.  Remote monitoring is used to monitor your Pacemaker from home. This monitoring reduces the number of office visits required to check your device to one time per year. It allows [place] to keep an eye on the functioning of your device to ensure it is working properly. You are scheduled for a device check from home on 03/08/16. You may send your transmission at any time that day. If you have a wireless device, the transmission will be sent automatically. After your physician reviews your transmission, you will receive a postcard with your next transmission date.   Any Other Special Instructions Will Be Listed Below (If Applicable).     If you need a refill on your cardiac medications before your next appointment, please call your pharmacy.

## 2015-12-10 ENCOUNTER — Other Ambulatory Visit: Payer: Self-pay | Admitting: Internal Medicine

## 2015-12-10 DIAGNOSIS — M79609 Pain in unspecified limb: Secondary | ICD-10-CM | POA: Diagnosis not present

## 2015-12-10 DIAGNOSIS — Z23 Encounter for immunization: Secondary | ICD-10-CM | POA: Diagnosis not present

## 2015-12-10 DIAGNOSIS — I10 Essential (primary) hypertension: Secondary | ICD-10-CM | POA: Diagnosis not present

## 2015-12-10 DIAGNOSIS — Z1231 Encounter for screening mammogram for malignant neoplasm of breast: Secondary | ICD-10-CM

## 2015-12-10 DIAGNOSIS — E119 Type 2 diabetes mellitus without complications: Secondary | ICD-10-CM | POA: Diagnosis not present

## 2015-12-10 DIAGNOSIS — I4891 Unspecified atrial fibrillation: Secondary | ICD-10-CM | POA: Diagnosis not present

## 2015-12-10 LAB — CUP PACEART INCLINIC DEVICE CHECK
Battery Remaining Longevity: 41 mo
Battery Voltage: 2.98 V
Brady Statistic AP VP Percent: 99.99 %
Brady Statistic AP VS Percent: 0.01 %
Brady Statistic AS VP Percent: 0 %
Brady Statistic AS VS Percent: 0 %
Brady Statistic RA Percent Paced: 100 %
Brady Statistic RV Percent Paced: 99.99 %
Date Time Interrogation Session: 20171012180044
Implantable Lead Implant Date: 19990907
Implantable Lead Implant Date: 19990907
Implantable Lead Location: 753859
Implantable Lead Location: 753860
Implantable Lead Serial Number: 32904
Implantable Lead Serial Number: 33421
Lead Channel Impedance Value: 266 Ohm
Lead Channel Impedance Value: 266 Ohm
Lead Channel Impedance Value: 323 Ohm
Lead Channel Impedance Value: 323 Ohm
Lead Channel Pacing Threshold Amplitude: 1 V
Lead Channel Pacing Threshold Amplitude: 1 V
Lead Channel Pacing Threshold Pulse Width: 0.4 ms
Lead Channel Pacing Threshold Pulse Width: 0.4 ms
Lead Channel Sensing Intrinsic Amplitude: 0.25 mV
Lead Channel Sensing Intrinsic Amplitude: 0.25 mV
Lead Channel Sensing Intrinsic Amplitude: 14.375 mV
Lead Channel Sensing Intrinsic Amplitude: 14.375 mV
Lead Channel Setting Pacing Amplitude: 2 V
Lead Channel Setting Pacing Amplitude: 2.5 V
Lead Channel Setting Pacing Pulse Width: 0.4 ms
Lead Channel Setting Sensing Sensitivity: 8 mV

## 2015-12-24 ENCOUNTER — Ambulatory Visit: Payer: Medicare Other

## 2016-01-13 ENCOUNTER — Ambulatory Visit
Admission: RE | Admit: 2016-01-13 | Discharge: 2016-01-13 | Disposition: A | Discharge status: Home / Self Care | Payer: Medicare Other | Source: Ambulatory Visit | Attending: Internal Medicine | Admitting: Internal Medicine

## 2016-01-13 DIAGNOSIS — Z1231 Encounter for screening mammogram for malignant neoplasm of breast: Secondary | ICD-10-CM

## 2016-01-18 DIAGNOSIS — E083293 Diabetes mellitus due to underlying condition with mild nonproliferative diabetic retinopathy without macular edema, bilateral: Secondary | ICD-10-CM | POA: Diagnosis not present

## 2016-03-09 ENCOUNTER — Ambulatory Visit (INDEPENDENT_AMBULATORY_CARE_PROVIDER_SITE_OTHER): Payer: Medicare Other | Admitting: *Deleted

## 2016-03-09 DIAGNOSIS — I495 Sick sinus syndrome: Secondary | ICD-10-CM | POA: Diagnosis not present

## 2016-03-09 NOTE — Progress Notes (Signed)
Remote pacemaker transmission.   

## 2016-03-10 ENCOUNTER — Encounter: Payer: Self-pay | Admitting: Cardiology

## 2016-03-20 LAB — CUP PACEART REMOTE DEVICE CHECK
Battery Remaining Longevity: 36 mo
Battery Voltage: 2.98 V
Brady Statistic AP VP Percent: 99.99 %
Brady Statistic AP VS Percent: 0 %
Brady Statistic AS VP Percent: 0 %
Brady Statistic AS VS Percent: 0 %
Brady Statistic RA Percent Paced: 99.99 %
Brady Statistic RV Percent Paced: 99.99 %
Date Time Interrogation Session: 20180109154343
Implantable Lead Implant Date: 19990907
Implantable Lead Implant Date: 19990907
Implantable Lead Location: 753859
Implantable Lead Location: 753860
Implantable Lead Serial Number: 32904
Implantable Lead Serial Number: 33421
Implantable Pulse Generator Implant Date: 20140807
Lead Channel Impedance Value: 266 Ohm
Lead Channel Impedance Value: 285 Ohm
Lead Channel Impedance Value: 304 Ohm
Lead Channel Impedance Value: 304 Ohm
Lead Channel Pacing Threshold Amplitude: 1 V
Lead Channel Pacing Threshold Amplitude: 1.125 V
Lead Channel Pacing Threshold Pulse Width: 0.4 ms
Lead Channel Pacing Threshold Pulse Width: 0.4 ms
Lead Channel Sensing Intrinsic Amplitude: 0.25 mV
Lead Channel Sensing Intrinsic Amplitude: 0.25 mV
Lead Channel Sensing Intrinsic Amplitude: 14.375 mV
Lead Channel Sensing Intrinsic Amplitude: 14.375 mV
Lead Channel Setting Pacing Amplitude: 2 V
Lead Channel Setting Pacing Amplitude: 2.5 V
Lead Channel Setting Pacing Pulse Width: 0.4 ms
Lead Channel Setting Sensing Sensitivity: 8 mV

## 2016-04-07 DIAGNOSIS — M7551 Bursitis of right shoulder: Secondary | ICD-10-CM | POA: Diagnosis not present

## 2016-04-07 DIAGNOSIS — D509 Iron deficiency anemia, unspecified: Secondary | ICD-10-CM | POA: Diagnosis not present

## 2016-04-07 DIAGNOSIS — E119 Type 2 diabetes mellitus without complications: Secondary | ICD-10-CM | POA: Diagnosis not present

## 2016-04-07 DIAGNOSIS — M7552 Bursitis of left shoulder: Secondary | ICD-10-CM | POA: Diagnosis not present

## 2016-06-08 ENCOUNTER — Ambulatory Visit (INDEPENDENT_AMBULATORY_CARE_PROVIDER_SITE_OTHER): Payer: Medicare Other | Admitting: *Deleted

## 2016-06-08 ENCOUNTER — Telehealth: Payer: Self-pay | Admitting: Cardiology

## 2016-06-08 DIAGNOSIS — I495 Sick sinus syndrome: Secondary | ICD-10-CM

## 2016-06-08 NOTE — Telephone Encounter (Signed)
Spoke with pt and reminded pt of remote transmission that is due today. Pt verbalized understanding.   

## 2016-06-08 NOTE — Progress Notes (Signed)
Remote pacemaker transmission.   

## 2016-06-09 ENCOUNTER — Encounter: Payer: Self-pay | Admitting: Cardiology

## 2016-06-09 LAB — CUP PACEART REMOTE DEVICE CHECK
Battery Remaining Longevity: 37 mo
Battery Voltage: 2.97 V
Brady Statistic AP VP Percent: 100 %
Brady Statistic AP VS Percent: 0 %
Brady Statistic AS VP Percent: 0 %
Brady Statistic AS VS Percent: 0 %
Brady Statistic RA Percent Paced: 99.99 %
Brady Statistic RV Percent Paced: 99.99 %
Date Time Interrogation Session: 20180412181010
Implantable Lead Implant Date: 19990907
Implantable Lead Implant Date: 19990907
Implantable Lead Location: 753859
Implantable Lead Location: 753860
Implantable Lead Serial Number: 32904
Implantable Lead Serial Number: 33421
Implantable Pulse Generator Implant Date: 20140807
Lead Channel Impedance Value: 266 Ohm
Lead Channel Impedance Value: 266 Ohm
Lead Channel Impedance Value: 323 Ohm
Lead Channel Impedance Value: 323 Ohm
Lead Channel Pacing Threshold Amplitude: 1 V
Lead Channel Pacing Threshold Amplitude: 1.125 V
Lead Channel Pacing Threshold Pulse Width: 0.4 ms
Lead Channel Pacing Threshold Pulse Width: 0.4 ms
Lead Channel Sensing Intrinsic Amplitude: 0.25 mV
Lead Channel Sensing Intrinsic Amplitude: 0.25 mV
Lead Channel Sensing Intrinsic Amplitude: 14.375 mV
Lead Channel Sensing Intrinsic Amplitude: 14.375 mV
Lead Channel Setting Pacing Amplitude: 2 V
Lead Channel Setting Pacing Amplitude: 2.5 V
Lead Channel Setting Pacing Pulse Width: 0.4 ms
Lead Channel Setting Sensing Sensitivity: 8 mV

## 2016-06-28 DIAGNOSIS — I5042 Chronic combined systolic (congestive) and diastolic (congestive) heart failure: Secondary | ICD-10-CM | POA: Diagnosis not present

## 2016-06-28 DIAGNOSIS — I10 Essential (primary) hypertension: Secondary | ICD-10-CM | POA: Diagnosis not present

## 2016-06-28 DIAGNOSIS — D509 Iron deficiency anemia, unspecified: Secondary | ICD-10-CM | POA: Diagnosis not present

## 2016-06-28 DIAGNOSIS — E119 Type 2 diabetes mellitus without complications: Secondary | ICD-10-CM | POA: Diagnosis not present

## 2016-09-07 ENCOUNTER — Encounter: Payer: Medicare Other | Admitting: *Deleted

## 2016-09-12 ENCOUNTER — Encounter: Payer: Self-pay | Admitting: Cardiology

## 2016-09-29 ENCOUNTER — Ambulatory Visit
Admission: RE | Admit: 2016-09-29 | Discharge: 2016-09-29 | Disposition: A | Discharge status: Home / Self Care | Payer: Medicare Other | Source: Ambulatory Visit | Attending: Family Medicine | Admitting: Family Medicine

## 2016-09-29 ENCOUNTER — Other Ambulatory Visit: Payer: Self-pay | Admitting: Family Medicine

## 2016-09-29 DIAGNOSIS — R54 Age-related physical debility: Secondary | ICD-10-CM | POA: Diagnosis not present

## 2016-09-29 DIAGNOSIS — E1122 Type 2 diabetes mellitus with diabetic chronic kidney disease: Secondary | ICD-10-CM | POA: Diagnosis not present

## 2016-09-29 DIAGNOSIS — R109 Unspecified abdominal pain: Secondary | ICD-10-CM

## 2016-09-29 DIAGNOSIS — Z7901 Long term (current) use of anticoagulants: Secondary | ICD-10-CM | POA: Diagnosis not present

## 2016-09-29 DIAGNOSIS — I482 Chronic atrial fibrillation: Secondary | ICD-10-CM | POA: Diagnosis not present

## 2016-09-29 DIAGNOSIS — Z79899 Other long term (current) drug therapy: Secondary | ICD-10-CM | POA: Diagnosis not present

## 2016-09-29 DIAGNOSIS — K5792 Diverticulitis of intestine, part unspecified, without perforation or abscess without bleeding: Secondary | ICD-10-CM | POA: Diagnosis not present

## 2016-09-29 DIAGNOSIS — Z7984 Long term (current) use of oral hypoglycemic drugs: Secondary | ICD-10-CM | POA: Diagnosis not present

## 2016-09-29 DIAGNOSIS — Z7689 Persons encountering health services in other specified circumstances: Secondary | ICD-10-CM | POA: Diagnosis not present

## 2016-09-29 DIAGNOSIS — I495 Sick sinus syndrome: Secondary | ICD-10-CM | POA: Diagnosis not present

## 2016-09-29 DIAGNOSIS — E039 Hypothyroidism, unspecified: Secondary | ICD-10-CM | POA: Diagnosis not present

## 2016-09-29 DIAGNOSIS — E669 Obesity, unspecified: Secondary | ICD-10-CM | POA: Diagnosis not present

## 2016-09-29 DIAGNOSIS — I10 Essential (primary) hypertension: Secondary | ICD-10-CM | POA: Diagnosis not present

## 2016-10-24 DIAGNOSIS — E1122 Type 2 diabetes mellitus with diabetic chronic kidney disease: Secondary | ICD-10-CM | POA: Diagnosis not present

## 2016-10-24 DIAGNOSIS — I482 Chronic atrial fibrillation: Secondary | ICD-10-CM | POA: Diagnosis not present

## 2016-10-24 DIAGNOSIS — E039 Hypothyroidism, unspecified: Secondary | ICD-10-CM | POA: Diagnosis not present

## 2016-10-24 DIAGNOSIS — R54 Age-related physical debility: Secondary | ICD-10-CM | POA: Diagnosis not present

## 2016-10-24 DIAGNOSIS — R1084 Generalized abdominal pain: Secondary | ICD-10-CM | POA: Diagnosis not present

## 2016-10-24 DIAGNOSIS — Z7901 Long term (current) use of anticoagulants: Secondary | ICD-10-CM | POA: Diagnosis not present

## 2016-10-24 DIAGNOSIS — Z7984 Long term (current) use of oral hypoglycemic drugs: Secondary | ICD-10-CM | POA: Diagnosis not present

## 2016-10-24 DIAGNOSIS — R5383 Other fatigue: Secondary | ICD-10-CM | POA: Diagnosis not present

## 2016-10-24 DIAGNOSIS — I495 Sick sinus syndrome: Secondary | ICD-10-CM | POA: Diagnosis not present

## 2016-10-25 ENCOUNTER — Emergency Department (HOSPITAL_COMMUNITY): Payer: Medicare Other

## 2016-10-25 ENCOUNTER — Encounter (HOSPITAL_COMMUNITY): Payer: Self-pay | Admitting: Emergency Medicine

## 2016-10-25 ENCOUNTER — Emergency Department (HOSPITAL_COMMUNITY)
Admission: EM | Admit: 2016-10-25 | Discharge: 2016-10-25 | Disposition: A | Discharge status: Home / Self Care | Payer: Medicare Other | Attending: Emergency Medicine | Admitting: Emergency Medicine

## 2016-10-25 DIAGNOSIS — Z8673 Personal history of transient ischemic attack (TIA), and cerebral infarction without residual deficits: Secondary | ICD-10-CM | POA: Diagnosis not present

## 2016-10-25 DIAGNOSIS — I10 Essential (primary) hypertension: Secondary | ICD-10-CM | POA: Diagnosis not present

## 2016-10-25 DIAGNOSIS — E785 Hyperlipidemia, unspecified: Secondary | ICD-10-CM | POA: Insufficient documentation

## 2016-10-25 DIAGNOSIS — R112 Nausea with vomiting, unspecified: Secondary | ICD-10-CM

## 2016-10-25 DIAGNOSIS — Z7984 Long term (current) use of oral hypoglycemic drugs: Secondary | ICD-10-CM | POA: Diagnosis not present

## 2016-10-25 DIAGNOSIS — E039 Hypothyroidism, unspecified: Secondary | ICD-10-CM | POA: Diagnosis not present

## 2016-10-25 DIAGNOSIS — Z79899 Other long term (current) drug therapy: Secondary | ICD-10-CM | POA: Insufficient documentation

## 2016-10-25 DIAGNOSIS — E119 Type 2 diabetes mellitus without complications: Secondary | ICD-10-CM | POA: Insufficient documentation

## 2016-10-25 DIAGNOSIS — R197 Diarrhea, unspecified: Secondary | ICD-10-CM | POA: Insufficient documentation

## 2016-10-25 DIAGNOSIS — R0902 Hypoxemia: Secondary | ICD-10-CM | POA: Diagnosis not present

## 2016-10-25 DIAGNOSIS — Z7901 Long term (current) use of anticoagulants: Secondary | ICD-10-CM | POA: Diagnosis not present

## 2016-10-25 DIAGNOSIS — I480 Paroxysmal atrial fibrillation: Secondary | ICD-10-CM | POA: Insufficient documentation

## 2016-10-25 DIAGNOSIS — Z950 Presence of cardiac pacemaker: Secondary | ICD-10-CM | POA: Diagnosis not present

## 2016-10-25 LAB — CBC WITH DIFFERENTIAL/PLATELET
Basophils Absolute: 0 10*3/uL (ref 0.0–0.1)
Basophils Relative: 0 %
Eosinophils Absolute: 1.6 10*3/uL — ABNORMAL HIGH (ref 0.0–0.7)
Eosinophils Relative: 16 %
HCT: 38.5 % (ref 36.0–46.0)
Hemoglobin: 12.5 g/dL (ref 12.0–15.0)
Lymphocytes Relative: 14 %
Lymphs Abs: 1.4 10*3/uL (ref 0.7–4.0)
MCH: 26.5 pg (ref 26.0–34.0)
MCHC: 32.5 g/dL (ref 30.0–36.0)
MCV: 81.7 fL (ref 78.0–100.0)
Monocytes Absolute: 0.6 10*3/uL (ref 0.1–1.0)
Monocytes Relative: 6 %
Neutro Abs: 6.3 10*3/uL (ref 1.7–7.7)
Neutrophils Relative %: 64 %
Platelets: 224 10*3/uL (ref 150–400)
RBC: 4.71 MIL/uL (ref 3.87–5.11)
RDW: 17.9 % — ABNORMAL HIGH (ref 11.5–15.5)
WBC: 9.8 10*3/uL (ref 4.0–10.5)

## 2016-10-25 LAB — COMPREHENSIVE METABOLIC PANEL
ALT: 37 U/L (ref 14–54)
AST: 44 U/L — ABNORMAL HIGH (ref 15–41)
Albumin: 3.7 g/dL (ref 3.5–5.0)
Alkaline Phosphatase: 118 U/L (ref 38–126)
Anion gap: 15 (ref 5–15)
BUN: 24 mg/dL — ABNORMAL HIGH (ref 6–20)
CO2: 24 mmol/L (ref 22–32)
Calcium: 10.6 mg/dL — ABNORMAL HIGH (ref 8.9–10.3)
Chloride: 99 mmol/L — ABNORMAL LOW (ref 101–111)
Creatinine, Ser: 1.72 mg/dL — ABNORMAL HIGH (ref 0.44–1.00)
GFR calc Af Amer: 31 mL/min — ABNORMAL LOW (ref >60)
GFR calc non Af Amer: 26 mL/min — ABNORMAL LOW (ref >60)
Glucose, Bld: 265 mg/dL — ABNORMAL HIGH (ref 65–99)
Potassium: 3.9 mmol/L (ref 3.5–5.1)
Sodium: 138 mmol/L (ref 135–145)
Total Bilirubin: 0.7 mg/dL (ref 0.3–1.2)
Total Protein: 8.6 g/dL — ABNORMAL HIGH (ref 6.5–8.1)

## 2016-10-25 LAB — I-STAT TROPONIN, ED: Troponin i, poc: 0.06 ng/mL (ref 0.00–0.08)

## 2016-10-25 LAB — PROTIME-INR
INR: 3.5
Prothrombin Time: 34.9 seconds — ABNORMAL HIGH (ref 11.4–15.2)

## 2016-10-25 LAB — LIPASE, BLOOD: Lipase: 31 U/L (ref 11–51)

## 2016-10-25 MED ORDER — SODIUM CHLORIDE 0.9 % IV BOLUS (SEPSIS)
500.0000 mL | Freq: Once | INTRAVENOUS | Status: AC
  Administered 2016-10-25: 500 mL via INTRAVENOUS

## 2016-10-25 MED ORDER — METOCLOPRAMIDE HCL 5 MG/ML IJ SOLN
5.0000 mg | Freq: Once | INTRAMUSCULAR | Status: AC
  Administered 2016-10-25: 5 mg via INTRAVENOUS
  Filled 2016-10-25: qty 2

## 2016-10-25 MED ORDER — ONDANSETRON HCL 4 MG/2ML IJ SOLN
4.0000 mg | Freq: Once | INTRAMUSCULAR | Status: AC
  Administered 2016-10-25: 4 mg via INTRAVENOUS
  Filled 2016-10-25: qty 2

## 2016-10-25 MED ORDER — ONDANSETRON 4 MG PO TBDP: 4.0000 mg | ORAL_TABLET | Freq: Three times a day (TID) | ORAL | 0 refills | Status: DC | PRN

## 2016-10-25 MED ORDER — FAMOTIDINE IN NACL 20-0.9 MG/50ML-% IV SOLN
20.0000 mg | INTRAVENOUS | Status: AC
  Administered 2016-10-25: 20 mg via INTRAVENOUS
  Filled 2016-10-25: qty 50

## 2016-10-25 MED ORDER — DIPHENHYDRAMINE HCL 50 MG/ML IJ SOLN
12.5000 mg | Freq: Once | INTRAMUSCULAR | Status: AC
  Administered 2016-10-25: 12.5 mg via INTRAVENOUS
  Filled 2016-10-25: qty 1

## 2016-10-25 NOTE — Discharge Instructions (Signed)
Take the prescribed medication as directed for nausea.  Recommend oral fluids, gentle diet for now. Follow-up with your primary care doctor. Return to the ED for new or worsening symptoms.

## 2016-10-25 NOTE — ED Triage Notes (Signed)
Pt arrives from home reporting diarrhea x 6 days, CP describes as "burning like acid reflux" beginning yesterday at 2pm, and vomiting new last night. Pt denies dizziness, lightheadedness, SOB. Hx pacemaker.

## 2016-10-25 NOTE — ED Provider Notes (Signed)
[facility] DEPT Provider Note   CSN: [id number] Arrival date & time: 10/25/16  1032     History   Chief Complaint Chief Complaint  Patient presents with  . Emesis  . Chest Pain    HPI Tiffany [name] is a 83 y.o. female.  The history is provided by the patient and medical records.  Emesis   Associated symptoms include diarrhea.  Chest Pain   Associated symptoms include nausea and vomiting.     83 year old female with history of diverticulosis, diabetes, hypertension, hyperlipidemia, hypothyroidism, paroxysmal A. fib on Coumadin, prior stroke, tachy/brady syndrome with pacemaker in place, cardiomyopathy with estimated EF of 50-55%, presenting to the ED with nausea, vomiting, diarrhea.  Patient has had diarrhea for the past 6 days, nausea and vomiting for the past 3 days. States episodes have been watery and nonbloody. States she started having chest pain which she describes as indigestion last night around 11 PM. She has been burping this morning. States symptoms did not seem to be getting any better so she called EMS. She denies any fever or chills. No recent travel or sick contacts. She was on antibiotics for suspected upper respiratory infection, she finished these 1 week ago. States she cannot remember what it was but the tablets were light green in color. She denies any significant abdominal pain at this time, states her stomach just feels "sore" from vomiting. She does complain of dry mouth. Prior abdominal surgeries include cholecystectomy and hysterectomy.  Past Medical History:  Diagnosis Date  . Diverticulosis   . DM2 (diabetes mellitus, type 2) ([place])   . Esophageal reflux   . HTN (hypertension)   . Hyperlipidemia   . Hypothyroidism   . Microalbuminuria    last done in 1/07   . Paroxysmal atrial fibrillation (HCC)    and atrial tachycardia  . Stroke ([place]) 1999  . Tachycardia-bradycardia syndrome ([place])    s/p PPM  . Unspecified hypothyroidism 11/20/2013     Patient Active Problem List   Diagnosis Date Noted  . Acute renal failure syndrome ([place])   . Dehydration   . ARF (acute renal failure) ([place]) 11/19/2014  . Acute gastroenteritis 11/19/2014  . UTI (urinary tract infection) 11/22/2013  . Hypothyroidism 11/20/2013  . Acute blood loss anemia 11/20/2013  . Hematochezia 11/18/2013  . Warfarin-induced coagulopathy ([place]) 11/18/2013  . Pacemaker-Medtronic 01/04/2012  . Long term (current) use of anticoagulants 10/25/2011  . Tachycardia-bradycardia syndrome ([place]) 01/04/2011  . Atrial fibrillation ([place]) 01/05/2010  . Hyperlipidemia 07/28/2006  . Essential hypertension 07/28/2006  . CARDIOMYOPATHY 07/28/2006  . DM type 2 (diabetes mellitus, type 2) ([place]) 05/16/2006  . C V A/STROKE 05/16/2006  . HEMORRHOIDS, WITH BLEEDING 05/16/2006    Past Surgical History:  Procedure Laterality Date  . CHOLECYSTECTOMY    . COLONOSCOPY N/A 11/21/2013   Procedure: COLONOSCOPY;  Surgeon: Jeryl Columbia, MD;  Location: [facility] ENDOSCOPY;  Service: Endoscopy;  Laterality: N/A;  . HEMORRHOID SURGERY    . hysterectomy (other)    . PACEMAKER GENERATOR CHANGE  10/03/12   DMT Advisa DR gen change by Dr Rayann Heman  . PACEMAKER GENERATOR CHANGE N/A 10/03/2012   Procedure: PACEMAKER GENERATOR CHANGE;  Surgeon: Thompson Grayer, MD;  Location: [facility] CATH LAB;  Service: Cardiovascular;  Laterality: N/A;  . permanent pacemaker  1999   updated 2006 (MDT) by Dr Verlon Setting; gen change 09-2012 by Dr Rayann Heman (MDT)    OB History    No data available       Home Medications  Prior to Admission medications   Medication Sig Start Date End Date Taking? Authorizing Provider  diltiazem (DILACOR XR) 180 MG 24 hr capsule Take 180 mg by mouth daily.    [provider]  FERGON 240 (27 FE) MG tablet Take 1 tablet by mouth 3 (three) times daily. 10/31/14   [provider]  furosemide (LASIX) 20 MG tablet Take 20 mg by mouth daily.    [provider]  glipiZIDE (GLUCOTROL) 10  MG tablet Take 10 mg by mouth 2 (two) times daily before a meal.    [provider]  levothyroxine (SYNTHROID, LEVOTHROID) 25 MCG tablet Take 25 mcg by mouth daily before breakfast.    [provider]  metFORMIN (GLUCOPHAGE) 1000 MG tablet Take 1,000 mg by mouth 2 (two) times daily with a meal.    [provider]  metoprolol tartrate (LOPRESSOR) 25 MG tablet Take 25 mg by mouth 2 (two) times daily.    [provider]  potassium chloride SA (K-DUR,KLOR-CON) 20 MEQ tablet Take 20 mEq by mouth daily.    [provider]  ranitidine (ZANTAC) 150 MG capsule Take 150 mg by mouth every morning.     [provider]  rosuvastatin (CRESTOR) 10 MG tablet Take 10 mg by mouth daily.    [provider]  warfarin (COUMADIN) 2.5 MG tablet Take 2.5 mg by mouth daily at 6 PM.  04/12/15   [provider]  XOPENEX HFA 45 MCG/ACT inhaler Inhale 2 puffs into the lungs daily as needed for shortness of breath or wheezing. 09/10/15   [provider]    Family History Family History  Problem Relation Age of Onset  . Tuberculosis Mother   . Diabetes Father     Social History Social History  Substance Use Topics  . Smoking status: Never Smoker  . Smokeless tobacco: Never Used  . Alcohol use No     Allergies   Patient has no known allergies.   Review of Systems Review of Systems  Cardiovascular: Positive for chest pain.  Gastrointestinal: Positive for diarrhea, nausea and vomiting.  All other systems reviewed and are negative.    Physical Exam Updated Vital Signs Temp 98.4 F (36.9 C) (Oral)   SpO2 96%   Physical Exam  Constitutional: She is oriented to person, place, and time. She appears well-developed and well-nourished.  HENT:  Head: Normocephalic and atraumatic.  Mouth/Throat: Oropharynx is clear and moist.  Very dry mucous membranes  Eyes: Pupils are equal, round, and reactive to light. Conjunctivae and EOM are  normal.  Neck: Normal range of motion.  Cardiovascular: Normal rate, regular rhythm and normal heart sounds.   Pulmonary/Chest: Effort normal and breath sounds normal.  Abdominal: Soft. Bowel sounds are normal. There is no tenderness. There is no rigidity and no guarding.  Overall benign, nontender  Musculoskeletal: Normal range of motion.  Neurological: She is alert and oriented to person, place, and time.  Skin: Skin is warm and dry.  Psychiatric: She has a normal mood and affect.  Nursing note and vitals reviewed.    ED Treatments / Results  Labs (all labs ordered are listed, but only abnormal results are displayed) Labs Reviewed  CBC WITH DIFFERENTIAL/PLATELET - Abnormal; Notable for the following:       Result Value   RDW 17.9 (*)    Eosinophils Absolute 1.6 (*)    All other components within normal limits  COMPREHENSIVE METABOLIC PANEL - Abnormal; Notable for the following:  Chloride 99 (*)    Glucose, Bld 265 (*)    BUN 24 (*)    Creatinine, Ser 1.72 (*)    Calcium 10.6 (*)    Total Protein 8.6 (*)    AST 44 (*)    GFR calc non Af Amer 26 (*)    GFR calc Af Amer 31 (*)    All other components within normal limits  PROTIME-INR - Abnormal; Notable for the following:    Prothrombin Time 34.9 (*)    All other components within normal limits  C DIFFICILE QUICK SCREEN W PCR REFLEX  LIPASE, BLOOD  URINALYSIS, ROUTINE W REFLEX MICROSCOPIC  I-STAT TROPONIN, ED    EKG  EKG Interpretation None       Radiology Dg Abd Acute W/chest  Result Date: 10/25/2016 CLINICAL DATA:  Six days of diarrhea with burning sensation. Onset of vomiting last night. History of cardiomyopathy, diabetes, previous CVA. EXAM: DG ABDOMEN ACUTE W/ 1V CHEST COMPARISON:  KUB of September 29, 2016 FINDINGS: The lungs are adequately inflated. There is no focal infiltrate. The cardiac silhouette is top-normal in size. The pulmonary vascularity is not engorged. The ICD is in reasonable position  radiographically. There is calcification in the wall of the aortic arch. There is no pleural effusion. Within the abdomen no free air is observed. There is minimal gaseous distention of the small bowel loop in the left mid to lower abdomen. The colonic stool and gas pattern is normal. There is no fecal impaction. There are degenerative changes of the lower lumbar spine. No abnormal soft tissue calcifications are observed. There surgical clips in the gallbladder fossa. IMPRESSION: There is no acute cardiopulmonary abnormality. There is thoracic aortic atherosclerosis. No evidence of bowel obstruction or ileus. Given the persistent symptoms, abdominal and pelvic CT scanning may be a useful next imaging step. Electronically Signed   By: David  Martinique M.D.   On: 10/25/2016 12:00    Procedures Procedures (including critical care time)  Medications Ordered in ED Medications  sodium chloride 0.9 % bolus 500 mL (0 mLs Intravenous Stopped 10/25/16 1310)  ondansetron (ZOFRAN) injection 4 mg (4 mg Intravenous Given 10/25/16 1119)  famotidine (PEPCID) IVPB 20 mg premix (0 mg Intravenous Stopped 10/25/16 1151)  sodium chloride 0.9 % bolus 500 mL (0 mLs Intravenous Stopped 10/25/16 1511)  metoCLOPramide (REGLAN) injection 5 mg (5 mg Intravenous Given 10/25/16 1352)  diphenhydrAMINE (BENADRYL) injection 12.5 mg (12.5 mg Intravenous Given 10/25/16 1351)     Initial Impression / Assessment and Plan / ED Course  I have reviewed the triage vital signs and the nursing notes.  Pertinent labs & imaging results that were available during my care of the patient were reviewed by me and considered in my medical decision making (see chart for details).  83 year old female here with nausea, vomiting, and diarrhea. Does report being on antibiotics, finish them last week. I'm sure which ones, states tablet was light green in color. She is afebrile and nontoxic. She does appear clinically dry. Abdomen is soft and benign. We'll  plan for abdominal films, labs. Given IV fluid bolus, Zofran.  1:25 PM Patient reassessed-- vomited just before I entered room per family.  States she still feels nauseated.  States she has some cramping in her lower abdomen.  Abdomen reassessed--- remains soft, no focal tenderness, no peritoneal signs.  I discussed her results which are overall reassuring. Family acknowledged understanding. States she feels like she needs to urinate.  I assisted patient to bedside commode.  2:46 PM Patient has received second bolus of IVF and second round of medications.  Currently resting comfortably.  No more vomiting.  She is a bit drowsy which I suspect is from medications.  Abdomen remains soft.  Will plan for PO trial.  3:07 PM Patient tolerating some sips of coca-cola right now.  Has not had any diarrhea here to provide stool sample-- states it actually improved 2 days ago. Feel C. Diff less likely given these factors.  If keeping fluids down, feel she can be reasonably discharged home with continued symptomatic care.  3:21 PM Patient tolerating Coca-Cola well. She has not had any recurrent emesis. Her vitals remained stable. States she is feeling better. Feel she is stable for discharge home. Will have her follow-up closely with her primary care doctor. Zofran given for nausea. Encourage oral fluids, gentle diet for now progress back to normal as tolerated.  Discussed plan with patient, he/she acknowledged understanding and agreed with plan of care.  Return precautions given for new or worsening symptoms.  Patient seen and evaluated with attending physician, Dr. Oleta Mouse, who agrees with assessment and plan of care.  Final Clinical Impressions(s) / ED Diagnoses   Final diagnoses:  Nausea vomiting and diarrhea    New Prescriptions New Prescriptions   ONDANSETRON (ZOFRAN ODT) 4 MG DISINTEGRATING TABLET    Take 1 tablet (4 mg total) by mouth every 8 (eight) hours as needed for nausea.     Larene Pickett,  PA-C 10/25/16 1523    Forde Dandy, MD 10/25/16 [phone]

## 2016-10-28 DIAGNOSIS — K529 Noninfective gastroenteritis and colitis, unspecified: Secondary | ICD-10-CM | POA: Diagnosis not present

## 2016-10-30 ENCOUNTER — Inpatient Hospital Stay (HOSPITAL_COMMUNITY)
Admission: EM | Admit: 2016-10-30 | Discharge: 2016-11-03 | DRG: 683 | Disposition: A | Discharge status: Skilled Nursing Facility | Payer: Medicare Other | Attending: Internal Medicine | Admitting: Internal Medicine

## 2016-10-30 ENCOUNTER — Encounter (HOSPITAL_COMMUNITY): Payer: Self-pay

## 2016-10-30 ENCOUNTER — Emergency Department (HOSPITAL_COMMUNITY): Payer: Medicare Other

## 2016-10-30 DIAGNOSIS — Z79899 Other long term (current) drug therapy: Secondary | ICD-10-CM

## 2016-10-30 DIAGNOSIS — N3000 Acute cystitis without hematuria: Secondary | ICD-10-CM | POA: Diagnosis not present

## 2016-10-30 DIAGNOSIS — E785 Hyperlipidemia, unspecified: Secondary | ICD-10-CM | POA: Diagnosis present

## 2016-10-30 DIAGNOSIS — I480 Paroxysmal atrial fibrillation: Secondary | ICD-10-CM | POA: Diagnosis present

## 2016-10-30 DIAGNOSIS — Z8673 Personal history of transient ischemic attack (TIA), and cerebral infarction without residual deficits: Secondary | ICD-10-CM

## 2016-10-30 DIAGNOSIS — R278 Other lack of coordination: Secondary | ICD-10-CM | POA: Diagnosis not present

## 2016-10-30 DIAGNOSIS — D6832 Hemorrhagic disorder due to extrinsic circulating anticoagulants: Secondary | ICD-10-CM | POA: Diagnosis present

## 2016-10-30 DIAGNOSIS — T45515A Adverse effect of anticoagulants, initial encounter: Secondary | ICD-10-CM | POA: Diagnosis not present

## 2016-10-30 DIAGNOSIS — N183 Chronic kidney disease, stage 3 (moderate): Secondary | ICD-10-CM | POA: Diagnosis present

## 2016-10-30 DIAGNOSIS — Z7901 Long term (current) use of anticoagulants: Secondary | ICD-10-CM

## 2016-10-30 DIAGNOSIS — Z950 Presence of cardiac pacemaker: Secondary | ICD-10-CM | POA: Diagnosis not present

## 2016-10-30 DIAGNOSIS — R197 Diarrhea, unspecified: Secondary | ICD-10-CM | POA: Diagnosis not present

## 2016-10-30 DIAGNOSIS — I6789 Other cerebrovascular disease: Secondary | ICD-10-CM | POA: Diagnosis present

## 2016-10-30 DIAGNOSIS — N179 Acute kidney failure, unspecified: Principal | ICD-10-CM | POA: Diagnosis present

## 2016-10-30 DIAGNOSIS — E1122 Type 2 diabetes mellitus with diabetic chronic kidney disease: Secondary | ICD-10-CM | POA: Diagnosis present

## 2016-10-30 DIAGNOSIS — R748 Abnormal levels of other serum enzymes: Secondary | ICD-10-CM | POA: Diagnosis present

## 2016-10-30 DIAGNOSIS — I10 Essential (primary) hypertension: Secondary | ICD-10-CM | POA: Diagnosis present

## 2016-10-30 DIAGNOSIS — B961 Klebsiella pneumoniae [K. pneumoniae] as the cause of diseases classified elsewhere: Secondary | ICD-10-CM | POA: Diagnosis present

## 2016-10-30 DIAGNOSIS — N390 Urinary tract infection, site not specified: Secondary | ICD-10-CM | POA: Diagnosis present

## 2016-10-30 DIAGNOSIS — R2681 Unsteadiness on feet: Secondary | ICD-10-CM | POA: Diagnosis not present

## 2016-10-30 DIAGNOSIS — K529 Noninfective gastroenteritis and colitis, unspecified: Secondary | ICD-10-CM

## 2016-10-30 DIAGNOSIS — R41841 Cognitive communication deficit: Secondary | ICD-10-CM | POA: Diagnosis not present

## 2016-10-30 DIAGNOSIS — E860 Dehydration: Secondary | ICD-10-CM | POA: Diagnosis present

## 2016-10-30 DIAGNOSIS — E039 Hypothyroidism, unspecified: Secondary | ICD-10-CM | POA: Diagnosis present

## 2016-10-30 DIAGNOSIS — I129 Hypertensive chronic kidney disease with stage 1 through stage 4 chronic kidney disease, or unspecified chronic kidney disease: Secondary | ICD-10-CM | POA: Diagnosis present

## 2016-10-30 DIAGNOSIS — M6281 Muscle weakness (generalized): Secondary | ICD-10-CM | POA: Diagnosis not present

## 2016-10-30 DIAGNOSIS — N178 Other acute kidney failure: Secondary | ICD-10-CM | POA: Diagnosis not present

## 2016-10-30 DIAGNOSIS — I4891 Unspecified atrial fibrillation: Secondary | ICD-10-CM | POA: Diagnosis present

## 2016-10-30 DIAGNOSIS — K5649 Other impaction of intestine: Secondary | ICD-10-CM | POA: Diagnosis not present

## 2016-10-30 DIAGNOSIS — Z7984 Long term (current) use of oral hypoglycemic drugs: Secondary | ICD-10-CM | POA: Diagnosis not present

## 2016-10-30 DIAGNOSIS — E8770 Fluid overload, unspecified: Secondary | ICD-10-CM | POA: Diagnosis not present

## 2016-10-30 DIAGNOSIS — R109 Unspecified abdominal pain: Secondary | ICD-10-CM | POA: Diagnosis not present

## 2016-10-30 DIAGNOSIS — E119 Type 2 diabetes mellitus without complications: Secondary | ICD-10-CM

## 2016-10-30 DIAGNOSIS — R112 Nausea with vomiting, unspecified: Secondary | ICD-10-CM | POA: Diagnosis not present

## 2016-10-30 LAB — CBC WITH DIFFERENTIAL/PLATELET
Basophils Absolute: 0 10*3/uL (ref 0.0–0.1)
Basophils Relative: 0 %
Eosinophils Absolute: 2.4 10*3/uL — ABNORMAL HIGH (ref 0.0–0.7)
Eosinophils Relative: 28 %
HCT: 36.1 % (ref 36.0–46.0)
Hemoglobin: 11.7 g/dL — ABNORMAL LOW (ref 12.0–15.0)
Lymphocytes Relative: 15 %
Lymphs Abs: 1.3 10*3/uL (ref 0.7–4.0)
MCH: 26.7 pg (ref 26.0–34.0)
MCHC: 32.4 g/dL (ref 30.0–36.0)
MCV: 82.2 fL (ref 78.0–100.0)
Monocytes Absolute: 0.8 10*3/uL (ref 0.1–1.0)
Monocytes Relative: 10 %
Neutro Abs: 3.9 10*3/uL (ref 1.7–7.7)
Neutrophils Relative %: 47 %
Platelets: 216 10*3/uL (ref 150–400)
RBC: 4.39 MIL/uL (ref 3.87–5.11)
RDW: 17.9 % — ABNORMAL HIGH (ref 11.5–15.5)
WBC: 8.4 10*3/uL (ref 4.0–10.5)

## 2016-10-30 LAB — COMPREHENSIVE METABOLIC PANEL
ALT: 21 U/L (ref 14–54)
AST: 27 U/L (ref 15–41)
Albumin: 3.5 g/dL (ref 3.5–5.0)
Alkaline Phosphatase: 87 U/L (ref 38–126)
Anion gap: 13 (ref 5–15)
BUN: 52 mg/dL — ABNORMAL HIGH (ref 6–20)
CO2: 20 mmol/L — ABNORMAL LOW (ref 22–32)
Calcium: 9.1 mg/dL (ref 8.9–10.3)
Chloride: 103 mmol/L (ref 101–111)
Creatinine, Ser: 3.88 mg/dL — ABNORMAL HIGH (ref 0.44–1.00)
GFR calc Af Amer: 11 mL/min — ABNORMAL LOW (ref >60)
GFR calc non Af Amer: 10 mL/min — ABNORMAL LOW (ref >60)
Glucose, Bld: 161 mg/dL — ABNORMAL HIGH (ref 65–99)
Potassium: 4.9 mmol/L (ref 3.5–5.1)
Sodium: 136 mmol/L (ref 135–145)
Total Bilirubin: 0.8 mg/dL (ref 0.3–1.2)
Total Protein: 7.3 g/dL (ref 6.5–8.1)

## 2016-10-30 LAB — URINALYSIS, ROUTINE W REFLEX MICROSCOPIC
Bilirubin Urine: NEGATIVE
Glucose, UA: NEGATIVE mg/dL
Ketones, ur: NEGATIVE mg/dL
Nitrite: POSITIVE — AB
Protein, ur: NEGATIVE mg/dL
Specific Gravity, Urine: 1.014 (ref 1.005–1.030)
pH: 5 (ref 5.0–8.0)

## 2016-10-30 LAB — GLUCOSE, CAPILLARY: Glucose-Capillary: 73 mg/dL (ref 65–99)

## 2016-10-30 LAB — LIPASE, BLOOD: Lipase: 27 U/L (ref 11–51)

## 2016-10-30 LAB — PROTIME-INR
INR: 4.82
Prothrombin Time: 44.8 seconds — ABNORMAL HIGH (ref 11.4–15.2)

## 2016-10-30 MED ORDER — SODIUM CHLORIDE 0.9 % IV SOLN
INTRAVENOUS | Status: DC
  Administered 2016-10-30 – 2016-10-31 (×3): via INTRAVENOUS

## 2016-10-30 MED ORDER — MORPHINE SULFATE (PF) 4 MG/ML IV SOLN
4.0000 mg | Freq: Once | INTRAVENOUS | Status: AC
  Administered 2016-10-30: 4 mg via INTRAVENOUS
  Filled 2016-10-30: qty 1

## 2016-10-30 MED ORDER — WARFARIN - PHYSICIAN DOSING INPATIENT: Freq: Every day | Status: DC

## 2016-10-30 MED ORDER — DILTIAZEM HCL ER COATED BEADS 180 MG PO CP24
180.0000 mg | ORAL_CAPSULE | Freq: Every day | ORAL | Status: DC
  Administered 2016-10-31 – 2016-11-03 (×4): 180 mg via ORAL
  Filled 2016-10-30 (×4): qty 1

## 2016-10-30 MED ORDER — ONDANSETRON HCL 4 MG PO TABS: 4.0000 mg | ORAL_TABLET | Freq: Four times a day (QID) | ORAL | Status: DC | PRN

## 2016-10-30 MED ORDER — METOPROLOL TARTRATE 50 MG PO TABS
50.0000 mg | ORAL_TABLET | Freq: Two times a day (BID) | ORAL | Status: DC
  Administered 2016-10-30 – 2016-10-31 (×2): 50 mg via ORAL
  Filled 2016-10-30 (×2): qty 1

## 2016-10-30 MED ORDER — ROSUVASTATIN CALCIUM 10 MG PO TABS
10.0000 mg | ORAL_TABLET | Freq: Every day | ORAL | Status: DC
  Administered 2016-10-31 – 2016-11-03 (×4): 10 mg via ORAL
  Filled 2016-10-30 (×4): qty 1

## 2016-10-30 MED ORDER — IOPAMIDOL (ISOVUE-300) INJECTION 61%
INTRAVENOUS | Status: AC
  Filled 2016-10-30: qty 30

## 2016-10-30 MED ORDER — METOCLOPRAMIDE HCL 5 MG/ML IJ SOLN
10.0000 mg | Freq: Once | INTRAMUSCULAR | Status: AC
  Administered 2016-10-30: 10 mg via INTRAVENOUS
  Filled 2016-10-30: qty 2

## 2016-10-30 MED ORDER — LEVOTHYROXINE SODIUM 25 MCG PO TABS
25.0000 ug | ORAL_TABLET | Freq: Every day | ORAL | Status: DC
Start: 2016-10-31 — End: 2016-11-03
  Administered 2016-10-31 – 2016-11-03 (×4): 25 ug via ORAL
  Filled 2016-10-30 (×4): qty 1

## 2016-10-30 MED ORDER — ALBUTEROL SULFATE (2.5 MG/3ML) 0.083% IN NEBU
2.5000 mg | INHALATION_SOLUTION | Freq: Every day | RESPIRATORY_TRACT | Status: DC | PRN
  Administered 2016-11-02: 2.5 mg via RESPIRATORY_TRACT
  Filled 2016-10-30: qty 3

## 2016-10-30 MED ORDER — SODIUM CHLORIDE 0.9 % IV BOLUS (SEPSIS)
1000.0000 mL | Freq: Once | INTRAVENOUS | Status: AC
  Administered 2016-10-30: 1000 mL via INTRAVENOUS

## 2016-10-30 MED ORDER — ONDANSETRON HCL 4 MG/2ML IJ SOLN: 4.0000 mg | Freq: Four times a day (QID) | INTRAMUSCULAR | Status: DC | PRN

## 2016-10-30 MED ORDER — INSULIN ASPART 100 UNIT/ML ~~LOC~~ SOLN
0.0000 [IU] | Freq: Three times a day (TID) | SUBCUTANEOUS | Status: DC
  Administered 2016-10-31: 2 [IU] via SUBCUTANEOUS
  Administered 2016-10-31: 1 [IU] via SUBCUTANEOUS
  Administered 2016-11-01: 2 [IU] via SUBCUTANEOUS
  Administered 2016-11-01: 3 [IU] via SUBCUTANEOUS
  Administered 2016-11-01 – 2016-11-02 (×3): 2 [IU] via SUBCUTANEOUS
  Administered 2016-11-02 – 2016-11-03 (×2): 3 [IU] via SUBCUTANEOUS
  Administered 2016-11-03: 7 [IU] via SUBCUTANEOUS

## 2016-10-30 MED ORDER — WARFARIN - PHARMACIST DOSING INPATIENT: Freq: Every day | Status: DC

## 2016-10-30 MED ORDER — ONDANSETRON HCL 4 MG/2ML IJ SOLN
4.0000 mg | Freq: Once | INTRAMUSCULAR | Status: AC
  Administered 2016-10-30: 4 mg via INTRAVENOUS
  Filled 2016-10-30: qty 2

## 2016-10-30 MED ORDER — SODIUM CHLORIDE 0.9 % IV SOLN
1000.0000 mL | INTRAVENOUS | Status: DC
  Administered 2016-10-30: 1000 mL via INTRAVENOUS

## 2016-10-30 MED ORDER — POTASSIUM CHLORIDE CRYS ER 20 MEQ PO TBCR
20.0000 meq | EXTENDED_RELEASE_TABLET | Freq: Every day | ORAL | Status: DC
  Administered 2016-10-31 – 2016-11-03 (×4): 20 meq via ORAL
  Filled 2016-10-30 (×4): qty 1

## 2016-10-30 MED ORDER — DEXTROSE 5 % IV SOLN
1.0000 g | INTRAVENOUS | Status: DC
  Administered 2016-10-30 – 2016-11-01 (×3): 1 g via INTRAVENOUS
  Filled 2016-10-30 (×3): qty 10

## 2016-10-30 MED ORDER — IOPAMIDOL (ISOVUE-300) INJECTION 61%
30.0000 mL | Freq: Once | INTRAVENOUS | Status: AC | PRN
Start: 2016-10-30 — End: 2016-10-30
  Administered 2016-10-30: 30 mL via ORAL

## 2016-10-30 MED ORDER — WARFARIN SODIUM 2.5 MG PO TABS: 2.5000 mg | ORAL_TABLET | ORAL | Status: DC

## 2016-10-30 MED ORDER — FERROUS GLUCONATE 324 (38 FE) MG PO TABS
324.0000 mg | ORAL_TABLET | Freq: Every evening | ORAL | Status: DC
  Administered 2016-10-30 – 2016-11-02 (×4): 324 mg via ORAL
  Filled 2016-10-30 (×5): qty 1

## 2016-10-30 MED ORDER — LEVALBUTEROL TARTRATE 45 MCG/ACT IN AERO: 2.0000 | INHALATION_SPRAY | Freq: Every day | RESPIRATORY_TRACT | Status: DC | PRN

## 2016-10-30 MED ORDER — HYDROCODONE-ACETAMINOPHEN 5-325 MG PO TABS: 1.0000 | ORAL_TABLET | ORAL | Status: DC | PRN

## 2016-10-30 NOTE — ED Notes (Signed)
Bed: RC78 Expected date:  Expected time:  Means of arrival:  Comments: EMS-diarrhea

## 2016-10-30 NOTE — ED Triage Notes (Addendum)
Per report: Pt from home, lives alone.  C/O n/v/d x 2 weeks. Was seen here on 8/29 & on 9/1 at [facility] for the same.  Currently has an RX for zofran that she didn't have filled.  Denies pain.  C/O scant blood on tissue when she wipes and generalized weakness.

## 2016-10-30 NOTE — Progress Notes (Signed)
ANTICOAGULATION CONSULT NOTE - Initial Consult  Pharmacy Consult for Warfarin Indication: atrial fibrillation  No Known Allergies  Patient Measurements: Height: 5\' 2"  (157.5 cm) Weight: 180 lb 8.9 oz (81.9 kg) IBW/kg (Calculated) : 50.1  Vital Signs: Temp: 98.1 F (36.7 C) (09/03 1836) Temp Source: Oral (09/03 1836) BP: 122/44 (09/03 1836) Pulse Rate: 72 (09/03 1836)  Labs:  Recent Labs  10/30/16 1232  HGB 11.7*  HCT 36.1  PLT 216  CREATININE 3.88*    Estimated Creatinine Clearance: 11.1 mL/min (A) (by C-G formula based on SCr of 3.88 mg/dL (H)).   Medical History: Past Medical History:  Diagnosis Date  . Diverticulosis   . DM2 (diabetes mellitus, type 2) ([place])   . Esophageal reflux   . HTN (hypertension)   . Hyperlipidemia   . Hypothyroidism   . Microalbuminuria    last done in 1/07   . Paroxysmal atrial fibrillation (HCC)    and atrial tachycardia  . Stroke ([place]) 1999  . Tachycardia-bradycardia syndrome ([place])    s/p PPM  . Unspecified hypothyroidism 11/20/2013    Medications:  Prescriptions Prior to Admission  Medication Sig Dispense Refill Last Dose  . diltiazem (DILACOR XR) 180 MG 24 hr capsule Take 180 mg by mouth daily.   10/30/2016 at Unknown time  . FERGON 240 (27 FE) MG tablet Take 1 tablet by mouth every evening.   3 10/29/2016 at Unknown time  . furosemide (LASIX) 20 MG tablet Take 20 mg by mouth daily.   10/30/2016 at Unknown time  . glipiZIDE (GLUCOTROL) 10 MG tablet Take 5 mg by mouth daily.    10/30/2016 at Unknown time  . levothyroxine (SYNTHROID, LEVOTHROID) 25 MCG tablet Take 25 mcg by mouth daily before breakfast.   10/30/2016 at Unknown time  . metFORMIN (GLUCOPHAGE) 1000 MG tablet Take 1,000 mg by mouth 2 (two) times daily with a meal.   10/30/2016 at Unknown time  . metoprolol tartrate (LOPRESSOR) 25 MG tablet Take 50 mg by mouth 2 (two) times daily.    10/30/2016 at 1000  . ondansetron (ZOFRAN ODT) 4 MG disintegrating tablet Take 1 tablet (4 mg  total) by mouth every 8 (eight) hours as needed for nausea. 10 tablet 0 10/29/2016 at Unknown time  . potassium chloride SA (K-DUR,KLOR-CON) 20 MEQ tablet Take 20 mEq by mouth daily.   10/29/2016 at Unknown time  . ranitidine (ZANTAC) 150 MG capsule Take 150 mg by mouth every morning.    10/29/2016 at Unknown time  . rosuvastatin (CRESTOR) 10 MG tablet Take 10 mg by mouth daily.   10/30/2016 at Unknown time  . warfarin (COUMADIN) 2.5 MG tablet Take 2.5 mg by mouth every other day.    10/29/2016 at Unknown time  . XOPENEX HFA 45 MCG/ACT inhaler Inhale 2 puffs into the lungs daily as needed for shortness of breath or wheezing.   unknown   Scheduled:  . [START ON 10/31/2016] diltiazem  180 mg Oral Daily  . ferrous gluconate  324 mg Oral QPM  . [START ON 10/31/2016] insulin aspart  0-9 Units Subcutaneous TID WC  . iopamidol      . [START ON 10/31/2016] levothyroxine  25 mcg Oral QAC breakfast  . metoprolol tartrate  50 mg Oral BID  . [START ON 10/31/2016] potassium chloride SA  20 mEq Oral Daily  . [START ON 10/31/2016] rosuvastatin  10 mg Oral Daily    Assessment: 15 yoF admitted on 9/3 with weakness, abdominal pain, N/V, poor oral intake and  fatigue.  PMH includes Afib on chronic warfarin anticoagulation.  Pharmacy is consulted to resume warfarin dosing inpatient.  Home warfarin dose: 2.5 mg every other day.  Last dose on 9/2. Admission INR pending.  CBC: Hgb 11.7, Plt WNL SCr 3.88 is acutely elevated Drug-drug interactions: broad spectrum antibiotics may increase INR   Goal of Therapy:  INR 2-3 Monitor platelets by anticoagulation protocol: Yes   Plan:  No warfarin dose tonight based on every other day home dosing. Daily PT/INR. Monitor for signs and symptoms of bleeding.  Gretta Arab PharmD, BCPS Pager [phone] 10/30/2016 8:07 PM

## 2016-10-30 NOTE — ED Notes (Signed)
At this time PT still unable to provide urine sample 

## 2016-10-30 NOTE — ED Notes (Signed)
ED RN call report to [facility] @ [phone] @ 1710.

## 2016-10-30 NOTE — H&P (Addendum)
History and Physical    Tiffany [name] VQM:[id number] DOB: [birth year] DOA: 10/30/2016  Referring MD/NP/PA: Dr. Venora Maples   PCP: Rogers Blocker, MD   Patient coming from: home   Chief Complaint: weakness, nausea and vomiting   HPI: Tiffany [name] is a 83 y.o. female with medical history significant of HTN, HLD, DM, a-fib on Coumadin, presented to [facility] ED with main concern of two weeks duration of progressively worsening abd discomfort, epigastric mostly, intermittent and dull, associated with nausea and non bloody vomiting, poor oral intake, fatigue. Pain is worse with eating and with no specific alleviating factors. Patient reports pain is 7-8/10 in severity and non radiating. Pt reports no similar events in the past. Patient denies chest pain or dyspnea, no specific urinary concern other that low urine volume.    ED Course: Pt is hemodynamically stable, VSS, preliminary urine analysis is worrisome for UTI. Blood work notable for Cr 3.8 (was 1.7 three days ago). TRH asked to admit for further evaluation.   Review of Systems:  Constitutional: Negative for fever, chills, diaphoresis, activity change HENT: Negative for ear pain, nosebleeds, congestion, facial swelling, rhinorrhea, neck pain, neck stiffness and ear discharge.   Eyes: Negative for pain, discharge, redness, itching and visual disturbance.  Respiratory: Negative for cough, choking, chest tightness, shortness of breath Cardiovascular: Negative for chest pain, palpitations and leg swelling.  Gastrointestinal: Negative for abdominal distention.  Genitourinary: Negative for dysuria, urgency, frequency, hematuria, flank pain Musculoskeletal: Negative for back pain, joint swelling, arthralgias and gait problem.  Neurological: Negative for dizziness, tremors, seizures, syncope, facial asymmetry, speech difficulty Hematological: Negative for adenopathy. Does not bruise/bleed easily.  Psychiatric/Behavioral: Negative for  hallucinations, behavioral problems, confusion, dysphoric mood, decreased concentration and agitation.   Past Medical History:  Diagnosis Date  . Diverticulosis   . DM2 (diabetes mellitus, type 2) ([place])   . Esophageal reflux   . HTN (hypertension)   . Hyperlipidemia   . Hypothyroidism   . Microalbuminuria    last done in 1/07   . Paroxysmal atrial fibrillation (HCC)    and atrial tachycardia  . Stroke ([place]) 1999  . Tachycardia-bradycardia syndrome ([place])    s/p PPM  . Unspecified hypothyroidism 11/20/2013    Past Surgical History:  Procedure Laterality Date  . CHOLECYSTECTOMY    . COLONOSCOPY N/A 11/21/2013   Procedure: COLONOSCOPY;  Surgeon: Jeryl Columbia, MD;  Location: [facility] ENDOSCOPY;  Service: Endoscopy;  Laterality: N/A;  . HEMORRHOID SURGERY    . hysterectomy (other)    . PACEMAKER GENERATOR CHANGE  10/03/12   DMT Advisa DR gen change by Dr Rayann Heman  . PACEMAKER GENERATOR CHANGE N/A 10/03/2012   Procedure: PACEMAKER GENERATOR CHANGE;  Surgeon: Thompson Grayer, MD;  Location: [facility] CATH LAB;  Service: Cardiovascular;  Laterality: N/A;  . permanent pacemaker  1999   updated 2006 (MDT) by Dr Verlon Setting; gen change 09-2012 by Dr Rayann Heman (MDT)   Social History:  reports that she has never smoked. She has never used smokeless tobacco. She reports that she does not drink alcohol or use drugs.  No Known Allergies  Family History  Problem Relation Age of Onset  . Tuberculosis Mother   . Diabetes Father     Prior to Admission medications   Medication Sig Start Date End Date Taking? Authorizing Provider  diltiazem (DILACOR XR) 180 MG 24 hr capsule Take 180 mg by mouth daily.   Yes [provider]  FERGON 240 (27 FE) MG tablet Take 1 tablet  by mouth every evening.  10/31/14  Yes [provider]  furosemide (LASIX) 20 MG tablet Take 20 mg by mouth daily.   Yes [provider]  glipiZIDE (GLUCOTROL) 10 MG tablet Take 5 mg by mouth daily.    Yes [provider]    levothyroxine (SYNTHROID, LEVOTHROID) 25 MCG tablet Take 25 mcg by mouth daily before breakfast.   Yes [provider]  metFORMIN (GLUCOPHAGE) 1000 MG tablet Take 1,000 mg by mouth 2 (two) times daily with a meal.   Yes [provider]  metoprolol tartrate (LOPRESSOR) 25 MG tablet Take 50 mg by mouth 2 (two) times daily.    Yes [provider]  ondansetron (ZOFRAN ODT) 4 MG disintegrating tablet Take 1 tablet (4 mg total) by mouth every 8 (eight) hours as needed for nausea. 10/25/16  Yes Larene Pickett, PA-C  potassium chloride SA (K-DUR,KLOR-CON) 20 MEQ tablet Take 20 mEq by mouth daily.   Yes [provider]  ranitidine (ZANTAC) 150 MG capsule Take 150 mg by mouth every morning.    Yes [provider]  rosuvastatin (CRESTOR) 10 MG tablet Take 10 mg by mouth daily.   Yes [provider]  warfarin (COUMADIN) 2.5 MG tablet Take 2.5 mg by mouth every other day.  04/12/15  Yes [provider]  XOPENEX HFA 45 MCG/ACT inhaler Inhale 2 puffs into the lungs daily as needed for shortness of breath or wheezing. 09/10/15  Yes [provider]    Physical Exam: Vitals:   10/30/16 1336 10/30/16 1544 10/30/16 1759 10/30/16 1836  BP: (!) 113/53 (!) 110/48 (!) 116/47 (!) 122/44  Pulse: (!) 59 65 60 72  Resp: 16 18 15 20   Temp:    98.1 F (36.7 C)  TempSrc:    Oral  SpO2: 97% 98% 96% 96%  Weight:    81.9 kg (180 lb 8.9 oz)  Height:    5\' 2"  (1.575 m)    Constitutional: NAD, calm, comfortable Vitals:   10/30/16 1336 10/30/16 1544 10/30/16 1759 10/30/16 1836  BP: (!) 113/53 (!) 110/48 (!) 116/47 (!) 122/44  Pulse: (!) 59 65 60 72  Resp: 16 18 15 20   Temp:    98.1 F (36.7 C)  TempSrc:    Oral  SpO2: 97% 98% 96% 96%  Weight:    81.9 kg (180 lb 8.9 oz)  Height:    5\' 2"  (1.575 m)   Eyes: PERRL, lids and conjunctivae normal ENMT: Mucous membranes are dry. Posterior pharynx clear of any exudate or lesions.Normal dentition.   Neck: normal, supple, no masses, no thyromegaly Respiratory: clear to auscultation bilaterally, no wheezing, no crackles. Normal respiratory effort. No accessory muscle use.  Cardiovascular: Regular rate and rhythm, no  rubs / gallops. No extremity edema. 2+ pedal pulses. No carotid bruits.  Abdomen: mild tenderness in epigastric aera, no masses palpated. No hepatosplenomegaly. Bowel sounds positive.  Musculoskeletal: no clubbing / cyanosis. No joint deformity upper and lower extremities. Good ROM, no contractures. Normal muscle tone.  Skin: no rashes, lesions, ulcers. No induration Neurologic: CN 2-12 grossly intact. Sensation intact, DTR normal. Strength 5/5 in all 4.  Psychiatric: Normal judgment and insight. Alert and oriented x 3. Normal mood.   Labs on Admission: I have personally reviewed following labs and imaging studies  CBC:  Recent Labs Lab 10/25/16 1112 10/30/16 1232  WBC 9.8 8.4  NEUTROABS 6.3 3.9  HGB 12.5 11.7*  HCT 38.5 36.1  MCV 81.7 82.2  PLT  224 161   Basic Metabolic Panel:  Recent Labs Lab 10/25/16 1112 10/30/16 1232  NA 138 136  K 3.9 4.9  CL 99* 103  CO2 24 20*  GLUCOSE 265* 161*  BUN 24* 52*  CREATININE 1.72* 3.88*  CALCIUM 10.6* 9.1   Liver Function Tests:  Recent Labs Lab 10/25/16 1112 10/30/16 1232  AST 44* 27  ALT 37 21  ALKPHOS 118 87  BILITOT 0.7 0.8  PROT 8.6* 7.3  ALBUMIN 3.7 3.5    Recent Labs Lab 10/25/16 1112 10/30/16 1232  LIPASE 31 27   Coagulation Profile:  Recent Labs Lab 10/25/16 1112  INR 3.50   Urine analysis:    Component Value Date/Time   COLORURINE AMBER (A) 10/30/2016 1444   APPEARANCEUR CLOUDY (A) 10/30/2016 1444   LABSPEC 1.014 10/30/2016 1444   PHURINE 5.0 10/30/2016 1444   GLUCOSEU NEGATIVE 10/30/2016 1444   HGBUR MODERATE (A) 10/30/2016 1444   BILIRUBINUR NEGATIVE 10/30/2016 1444   BILIRUBINUR small 09/20/2011 1037   KETONESUR NEGATIVE 10/30/2016 1444   PROTEINUR NEGATIVE 10/30/2016 1444    UROBILINOGEN 0.2 11/18/2014 1941   NITRITE POSITIVE (A) 10/30/2016 1444   LEUKOCYTESUR MODERATE (A) 10/30/2016 1444   Radiological Exams on Admission: Ct Abdomen Pelvis Wo Contrast  Result Date: 10/30/2016 CLINICAL DATA:  83 year old female with abdominal pain, nausea and vomiting for 2 weeks. EXAM: CT ABDOMEN AND PELVIS WITHOUT CONTRAST TECHNIQUE: Multidetector CT imaging of the abdomen and pelvis was performed following the standard protocol without IV contrast. COMPARISON:  11/18/2014 and prior CTs FINDINGS: Please note that parenchymal abnormalities may be missed without intravenous contrast. Lower chest: No acute abnormality. Bilateral lower lobe cylindrical bronchiectasis noted. Cardiomegaly and pacemaker lead identified. Hepatobiliary: The liver is unremarkable. The patient is status post cholecystectomy. No biliary dilatation. Pancreas: Atrophic without other abnormality Spleen: Unremarkable Adrenals/Urinary Tract: The kidneys, adrenal glands and bladder are unremarkable except for right renal cysts. Stomach/Bowel: Mildly distended proximal mid small bowel loops are noted with decreased caliber of distal small bowel loops. No abrupt transition point is identified. The stomach is distended. No bowel wall thickening or inflammatory changes noted. Sigmoid colonic diverticulosis noted without evidence of diverticulitis. No evidence of pneumoperitoneum or abscess. Vascular/Lymphatic: Aortic atherosclerosis. No enlarged abdominal or pelvic lymph nodes. Reproductive: Status post hysterectomy. No adnexal masses. Other: No ascites. A small umbilical hernia containing fat and pelvic floor laxity are again identified. Musculoskeletal: No acute abnormality. No suspicious focal bony lesions. Compression of the L2 inferior endplate is unchanged. IMPRESSION: 1. Dilated proximal and mid small bowel loops with nondistended distal small bowel loops, but no evidence of abrupt transition point. Favor partial small  bowel obstruction over ileus. No evidence of pneumoperitoneum, abscess or free fluid. 2. Bilateral lower lobe cylindrical bronchiectasis. 3. Cardiomegaly 4.  Aortic Atherosclerosis (ICD10-I70.0). Electronically Signed   By: Margarette Canada M.D.   On: 10/30/2016 16:17    EKG: pending   Assessment/Plan Active Problems:   Acute renal failure (ARF) (HCC) - suspect pre renal etiology - will hold metformin and lasix that pt takes at home - will place on IVF and repeat BMP in AM    Abd pain, N/V, UTI - preliminary UA suspicious for UTI which is possible cause of pt's symptoms - will place on Rocephin - follow up on urine cultures and readjust the medical regimen as indicated - there was a question of partial small bowel obstruction but pt has good BS, passing gas, will need close monitoring - currently no vomiting  and will therefore hold off on NGT    A-fib, CHADS score 4 - continue coumadin  - continue Cardizem and Metoprolol     HLD - continue home regimen with statin    DM type II - hold Metformin and Glipizide for now until oral intake improves - place on SSI - A1C in AM    HTN - cont home medical regimen    DVT prophylaxis: Coumadin  Code Status: Full Family Communication: Pt and daughter updated at bedside Disposition Plan: to be determined, likely home once medically stable  Consults called: None Admission status: Inpatient   Faye Ramsay MD [facility] Pager [phone]  If 7PM-7AM, please contact night-coverage [email or web address] Password [email or web address]  10/30/2016, 7:21 PM

## 2016-10-30 NOTE — ED Provider Notes (Addendum)
[facility] DEPT Provider Note   CSN: [id number] Arrival date & time: 10/30/16  1106     History   Chief Complaint No chief complaint on file.   HPI Tiffany [name] is a 83 y.o. female.  HPI Patient reports ongoing nausea vomiting diarrhea over the past 2 weeks.  She attempted to try Zofran at home to help her nausea which was prescribed by her primary care doctor but this did not seem to be helping.  No melena or hematochezia described.  No hematemesis.  No chest pain shortness of breath.  Patient reports increasing weakness today.  Symptoms are moderate in severity.   Past Medical History:  Diagnosis Date  . Diverticulosis   . DM2 (diabetes mellitus, type 2) ([place])   . Esophageal reflux   . HTN (hypertension)   . Hyperlipidemia   . Hypothyroidism   . Microalbuminuria    last done in 1/07   . Paroxysmal atrial fibrillation (HCC)    and atrial tachycardia  . Stroke ([place]) 1999  . Tachycardia-bradycardia syndrome ([place])    s/p PPM  . Unspecified hypothyroidism 11/20/2013    Patient Active Problem List   Diagnosis Date Noted  . Acute renal failure syndrome ([place])   . Dehydration   . ARF (acute renal failure) ([place]) 11/19/2014  . Acute gastroenteritis 11/19/2014  . UTI (urinary tract infection) 11/22/2013  . Hypothyroidism 11/20/2013  . Acute blood loss anemia 11/20/2013  . Hematochezia 11/18/2013  . Warfarin-induced coagulopathy ([place]) 11/18/2013  . Pacemaker-[employer] 01/04/2012  . Long term (current) use of anticoagulants 10/25/2011  . Tachycardia-bradycardia syndrome ([place]) 01/04/2011  . Atrial fibrillation ([place]) 01/05/2010  . Hyperlipidemia 07/28/2006  . Essential hypertension 07/28/2006  . CARDIOMYOPATHY 07/28/2006  . DM type 2 (diabetes mellitus, type 2) ([place]) 05/16/2006  . C V A/STROKE 05/16/2006  . HEMORRHOIDS, WITH BLEEDING 05/16/2006    Past Surgical History:  Procedure Laterality Date  . CHOLECYSTECTOMY    . COLONOSCOPY N/A 11/21/2013   Procedure: COLONOSCOPY;  Surgeon: Jeryl Columbia, MD;  Location: [facility] ENDOSCOPY;  Service: Endoscopy;  Laterality: N/A;  . HEMORRHOID SURGERY    . hysterectomy (other)    . PACEMAKER GENERATOR CHANGE  10/03/12   DMT Advisa DR gen change by Dr Rayann Heman  . PACEMAKER GENERATOR CHANGE N/A 10/03/2012   Procedure: PACEMAKER GENERATOR CHANGE;  Surgeon: Thompson Grayer, MD;  Location: [facility] CATH LAB;  Service: Cardiovascular;  Laterality: N/A;  . permanent pacemaker  1999   updated 2006 (MDT) by Dr Verlon Setting; gen change 09-2012 by Dr Rayann Heman (MDT)    OB History    No data available       Home Medications    Prior to Admission medications   Medication Sig Start Date End Date Taking? Authorizing Provider  diltiazem (DILACOR XR) 180 MG 24 hr capsule Take 180 mg by mouth daily.   Yes [provider]  FERGON 240 (27 FE) MG tablet Take 1 tablet by mouth every evening.  10/31/14  Yes [provider]  furosemide (LASIX) 20 MG tablet Take 20 mg by mouth daily.   Yes [provider]  glipiZIDE (GLUCOTROL) 10 MG tablet Take 5 mg by mouth daily.    Yes [provider]  levothyroxine (SYNTHROID, LEVOTHROID) 25 MCG tablet Take 25 mcg by mouth daily before breakfast.   Yes [provider]  metFORMIN (GLUCOPHAGE) 1000 MG tablet Take 1,000 mg by mouth 2 (two) times daily with a meal.   Yes [provider]  metoprolol tartrate (  LOPRESSOR) 25 MG tablet Take 50 mg by mouth 2 (two) times daily.    Yes [provider]  ondansetron (ZOFRAN ODT) 4 MG disintegrating tablet Take 1 tablet (4 mg total) by mouth every 8 (eight) hours as needed for nausea. 10/25/16  Yes Larene Pickett, PA-C  potassium chloride SA (K-DUR,KLOR-CON) 20 MEQ tablet Take 20 mEq by mouth daily.   Yes [provider]  ranitidine (ZANTAC) 150 MG capsule Take 150 mg by mouth every morning.    Yes [provider]  rosuvastatin (CRESTOR) 10 MG tablet Take 10 mg by mouth daily.   Yes [provider]  warfarin (COUMADIN) 2.5 MG tablet Take 2.5 mg by mouth every other day.  04/12/15  Yes [provider]  XOPENEX HFA 45 MCG/ACT inhaler Inhale 2 puffs into the lungs daily as needed for shortness of breath or wheezing. 09/10/15  Yes [provider]    Family History Family History  Problem Relation Age of Onset  . Tuberculosis Mother   . Diabetes Father     Social History Social History  Substance Use Topics  . Smoking status: Never Smoker  . Smokeless tobacco: Never Used  . Alcohol use No     Allergies   Patient has no known allergies.   Review of Systems Review of Systems  All other systems reviewed and are negative.    Physical Exam Updated Vital Signs BP (!) 110/48   Pulse 65   Temp 97.7 F (36.5 C)   Resp 18   Ht 5' 2.5" (1.588 m)   Wt 81.2 kg (179 lb)   SpO2 98%   BMI 32.22 kg/m   Physical Exam  Constitutional: She is oriented to person, place, and time. She appears well-developed and well-nourished.  HENT:  Head: Normocephalic.  Eyes: EOM are normal.  Neck: Normal range of motion.  Cardiovascular: Normal rate and regular rhythm.   Pulmonary/Chest: Effort normal and breath sounds normal.  Abdominal: She exhibits distension. She exhibits no mass. There is no tenderness. There is no guarding.  Musculoskeletal: Normal range of motion.  Neurological: She is alert and oriented to person, place, and time.  Psychiatric: She has a normal mood and affect.  Nursing note and vitals reviewed.    ED Treatments / Results  Labs (all labs ordered are listed, but only abnormal results are displayed) Labs Reviewed  CBC WITH DIFFERENTIAL/PLATELET - Abnormal; Notable for the following:       Result Value   Hemoglobin 11.7 (*)    RDW 17.9 (*)    Eosinophils Absolute 2.4 (*)    All other components within normal limits  COMPREHENSIVE METABOLIC PANEL - Abnormal; Notable for the following:    CO2 20 (*)    Glucose, Bld 161 (*)     BUN 52 (*)    Creatinine, Ser 3.88 (*)    GFR calc non Af Amer 10 (*)    GFR calc Af Amer 11 (*)    All other components within normal limits  URINALYSIS, ROUTINE W REFLEX MICROSCOPIC - Abnormal; Notable for the following:    Color, Urine AMBER (*)    APPearance CLOUDY (*)    Hgb urine dipstick MODERATE (*)    Nitrite POSITIVE (*)    Leukocytes, UA MODERATE (*)    Bacteria, UA MANY (*)    Squamous Epithelial / LPF 6-30 (*)    All other components within normal limits  URINE CULTURE  LIPASE, BLOOD    EKG  EKG Interpretation None       Radiology Ct Abdomen Pelvis Wo Contrast  Result Date: 10/30/2016 CLINICAL DATA:  83 year old female with abdominal pain, nausea and vomiting for 2 weeks. EXAM: CT ABDOMEN AND PELVIS WITHOUT CONTRAST TECHNIQUE: Multidetector CT imaging of the abdomen and pelvis was performed following the standard protocol without IV contrast. COMPARISON:  11/18/2014 and prior CTs FINDINGS: Please note that parenchymal abnormalities may be missed without intravenous contrast. Lower chest: No acute abnormality. Bilateral lower lobe cylindrical bronchiectasis noted. Cardiomegaly and pacemaker lead identified. Hepatobiliary: The liver is unremarkable. The patient is status post cholecystectomy. No biliary dilatation. Pancreas: Atrophic without other abnormality Spleen: Unremarkable Adrenals/Urinary Tract: The kidneys, adrenal glands and bladder are unremarkable except for right renal cysts. Stomach/Bowel: Mildly distended proximal mid small bowel loops are noted with decreased caliber of distal small bowel loops. No abrupt transition point is identified. The stomach is distended. No bowel wall thickening or inflammatory changes noted. Sigmoid colonic diverticulosis noted without evidence of diverticulitis. No evidence of pneumoperitoneum or abscess. Vascular/Lymphatic: Aortic atherosclerosis. No enlarged abdominal or pelvic lymph nodes. Reproductive: Status post hysterectomy. No  adnexal masses. Other: No ascites. A small umbilical hernia containing fat and pelvic floor laxity are again identified. Musculoskeletal: No acute abnormality. No suspicious focal bony lesions. Compression of the L2 inferior endplate is unchanged. IMPRESSION: 1. Dilated proximal and mid small bowel loops with nondistended distal small bowel loops, but no evidence of abrupt transition point. Favor partial small bowel obstruction over ileus. No evidence of pneumoperitoneum, abscess or free fluid. 2. Bilateral lower lobe cylindrical bronchiectasis. 3. Cardiomegaly 4.  Aortic Atherosclerosis (ICD10-I70.0). Electronically Signed   By: Margarette Canada M.D.   On: 10/30/2016 16:17    Procedures Procedures (including critical care time)  Medications Ordered in ED Medications  sodium chloride 0.9 % bolus 1,000 mL (0 mLs Intravenous Stopped 10/30/16 1416)    Followed by  0.9 %  sodium chloride infusion (1,000 mLs Intravenous New Bag/Given 10/30/16 1447)  iopamidol (ISOVUE-300) 61 % injection (not administered)  ondansetron (ZOFRAN) injection 4 mg (4 mg Intravenous Given 10/30/16 1239)  morphine 4 MG/ML injection 4 mg (4 mg Intravenous Given 10/30/16 1240)  iopamidol (ISOVUE-300) 61 % injection 30 mL (30 mLs Oral Contrast Given 10/30/16 1411)  metoCLOPramide (REGLAN) injection 10 mg (10 mg Intravenous Given 10/30/16 1447)     Initial Impression / Assessment and Plan / ED Course  I have reviewed the triage vital signs and the nursing notes.  Pertinent labs & imaging results that were available during my care of the patient were reviewed by me and considered in my medical decision making (see chart for details).     Patient with acute renal failure secondary to dehydration.  IV hydration now.  CT imaging pending at this time.  Patient will need admission the hospital.  4:30 PM CT scan favors partial small bowel obstruction.  Patient will be admitted the hospital for ongoing workup.  No vomiting in the ER  Final  Clinical Impressions(s) / ED Diagnoses   Final diagnoses:  Acute renal failure, unspecified acute renal failure type [facility])    New Prescriptions New Prescriptions   No medications on file     Jola Schmidt, MD 10/30/16 Marlana Latus    Jola Schmidt, MD 10/30/16 1630

## 2016-10-30 NOTE — ED Notes (Addendum)
Tried to pull rocephin out of pyxis but it's not listed as a medication due to give.  Pharmacy notified.

## 2016-10-30 NOTE — ED Notes (Signed)
ED TO INPATIENT HANDOFF REPORT  Name/Age/Gender Tiffany [name] 83 y.o. female  Code Status Code Status History    Date Active Date Inactive Code Status Order ID Comments User Context   11/19/2014  1:53 AM 11/20/2014  8:30 PM Full Code [id number]  Reubin Milan, MD Inpatient   11/18/2013 11:19 PM 11/22/2013  7:00 PM Full Code [id number]  Phillips Grout, MD Inpatient   10/03/2012  1:28 PM 10/03/2012  5:41 PM Full Code [id number]  Thompson Grayer, MD Inpatient      Home/SNF/Other Home  Chief Complaint Nausea, Vomiting,Diarrhea   Level of Care/Admitting Diagnosis ED Disposition    ED Disposition Condition [facility] Area: [facility] [[id number]]  Level of Care: Telemetry [5]  Admit to tele based on following criteria: Monitor QTC interval  Diagnosis: Acute renal failure (ARF) [facility]) [[id number]]  Admitting Physician: Maryruth Hancock  Attending Physician: Jackelyn Knife [[id number]]  Estimated length of stay: past midnight tomorrow  Certification:: I certify this patient will need inpatient services for at least 2 midnights  PT Class (Do Not Modify): Inpatient [101]  PT Acc Code (Do Not Modify): Private [1]       Medical History Past Medical History:  Diagnosis Date  . Diverticulosis   . DM2 (diabetes mellitus, type 2) ([place])   . Esophageal reflux   . HTN (hypertension)   . Hyperlipidemia   . Hypothyroidism   . Microalbuminuria    last done in 1/07   . Paroxysmal atrial fibrillation (HCC)    and atrial tachycardia  . Stroke ([place]) 1999  . Tachycardia-bradycardia syndrome ([place])    s/p PPM  . Unspecified hypothyroidism 11/20/2013    Allergies No Known Allergies  IV Location/Drains/Wounds Patient Lines/Drains/Airways Status   Active Line/Drains/Airways    Name:   Placement date:   Placement time:   Site:   Days:   Peripheral IV 10/30/16 Left;Posterior Forearm  10/30/16    1230    Forearm    less than 1           Labs/Imaging Results for orders placed or performed during the hospital encounter of 10/30/16 (from the past 48 hour(s))  CBC with Differential/Platelet     Status: Abnormal   Collection Time: 10/30/16 12:32 PM  Result Value Ref Range   WBC 8.4 4.0 - 10.5 K/uL   RBC 4.39 3.87 - 5.11 MIL/uL   Hemoglobin 11.7 (L) 12.0 - 15.0 g/dL   HCT 36.1 36.0 - 46.0 %   MCV 82.2 78.0 - 100.0 fL   MCH 26.7 26.0 - 34.0 pg   MCHC 32.4 30.0 - 36.0 g/dL   RDW 17.9 (H) 11.5 - 15.5 %   Platelets 216 150 - 400 K/uL   Neutrophils Relative % 47 %   Lymphocytes Relative 15 %   Monocytes Relative 10 %   Eosinophils Relative 28 %   Basophils Relative 0 %   Neutro Abs 3.9 1.7 - 7.7 K/uL   Lymphs Abs 1.3 0.7 - 4.0 K/uL   Monocytes Absolute 0.8 0.1 - 1.0 K/uL   Eosinophils Absolute 2.4 (H) 0.0 - 0.7 K/uL   Basophils Absolute 0.0 0.0 - 0.1 K/uL   RBC Morphology CRENATED RBCs   Comprehensive metabolic panel     Status: Abnormal   Collection Time: 10/30/16 12:32 PM  Result Value Ref Range   Sodium 136 135 - 145 mmol/L   Potassium 4.9 3.5 - 5.1 mmol/L   Chloride 103  101 - 111 mmol/L   CO2 20 (L) 22 - 32 mmol/L   Glucose, Bld 161 (H) 65 - 99 mg/dL   BUN 52 (H) 6 - 20 mg/dL   Creatinine, Ser 3.88 (H) 0.44 - 1.00 mg/dL   Calcium 9.1 8.9 - 10.3 mg/dL   Total Protein 7.3 6.5 - 8.1 g/dL   Albumin 3.5 3.5 - 5.0 g/dL   AST 27 15 - 41 U/L   ALT 21 14 - 54 U/L   Alkaline Phosphatase 87 38 - 126 U/L   Total Bilirubin 0.8 0.3 - 1.2 mg/dL   GFR calc non Af Amer 10 (L) >60 mL/min   GFR calc Af Amer 11 (L) >60 mL/min    Comment: (NOTE) The eGFR has been calculated using the CKD EPI equation. This calculation has not been validated in all clinical situations. eGFR's persistently <60 mL/min signify possible Chronic Kidney Disease.    Anion gap 13 5 - 15  Lipase, blood     Status: None   Collection Time: 10/30/16 12:32 PM  Result Value Ref Range   Lipase 27 11 - 51 U/L  Urinalysis, Routine w reflex  microscopic     Status: Abnormal   Collection Time: 10/30/16  2:44 PM  Result Value Ref Range   Color, Urine AMBER (A) YELLOW    Comment: BIOCHEMICALS MAY BE AFFECTED BY COLOR   APPearance CLOUDY (A) CLEAR   Specific Gravity, Urine 1.014 1.005 - 1.030   pH 5.0 5.0 - 8.0   Glucose, UA NEGATIVE NEGATIVE mg/dL   Hgb urine dipstick MODERATE (A) NEGATIVE   Bilirubin Urine NEGATIVE NEGATIVE   Ketones, ur NEGATIVE NEGATIVE mg/dL   Protein, ur NEGATIVE NEGATIVE mg/dL   Nitrite POSITIVE (A) NEGATIVE   Leukocytes, UA MODERATE (A) NEGATIVE   RBC / HPF 0-5 0 - 5 RBC/hpf   WBC, UA 6-30 0 - 5 WBC/hpf   Bacteria, UA MANY (A) NONE SEEN   Squamous Epithelial / LPF 6-30 (A) NONE SEEN   Hyaline Casts, UA PRESENT    Ct Abdomen Pelvis Wo Contrast  Result Date: 10/30/2016 CLINICAL DATA:  83 year old female with abdominal pain, nausea and vomiting for 2 weeks. EXAM: CT ABDOMEN AND PELVIS WITHOUT CONTRAST TECHNIQUE: Multidetector CT imaging of the abdomen and pelvis was performed following the standard protocol without IV contrast. COMPARISON:  11/18/2014 and prior CTs FINDINGS: Please note that parenchymal abnormalities may be missed without intravenous contrast. Lower chest: No acute abnormality. Bilateral lower lobe cylindrical bronchiectasis noted. Cardiomegaly and pacemaker lead identified. Hepatobiliary: The liver is unremarkable. The patient is status post cholecystectomy. No biliary dilatation. Pancreas: Atrophic without other abnormality Spleen: Unremarkable Adrenals/Urinary Tract: The kidneys, adrenal glands and bladder are unremarkable except for right renal cysts. Stomach/Bowel: Mildly distended proximal mid small bowel loops are noted with decreased caliber of distal small bowel loops. No abrupt transition point is identified. The stomach is distended. No bowel wall thickening or inflammatory changes noted. Sigmoid colonic diverticulosis noted without evidence of diverticulitis. No evidence of  pneumoperitoneum or abscess. Vascular/Lymphatic: Aortic atherosclerosis. No enlarged abdominal or pelvic lymph nodes. Reproductive: Status post hysterectomy. No adnexal masses. Other: No ascites. A small umbilical hernia containing fat and pelvic floor laxity are again identified. Musculoskeletal: No acute abnormality. No suspicious focal bony lesions. Compression of the L2 inferior endplate is unchanged. IMPRESSION: 1. Dilated proximal and mid small bowel loops with nondistended distal small bowel loops, but no evidence of abrupt transition point. Favor partial small bowel obstruction over  ileus. No evidence of pneumoperitoneum, abscess or free fluid. 2. Bilateral lower lobe cylindrical bronchiectasis. 3. Cardiomegaly 4.  Aortic Atherosclerosis (ICD10-I70.0). Electronically Signed   By: Margarette Canada M.D.   On: 10/30/2016 16:17    Pending Labs Unresulted Labs    Start     Ordered   10/30/16 1622  Urine Culture  Add-on,   STAT     10/30/16 1621      Vitals/Pain Today's Vitals   10/30/16 1303 10/30/16 1336 10/30/16 1544 10/30/16 1759  BP:  (!) 113/53 (!) 110/48 (!) 116/47  Pulse:  (!) 59 65 60  Resp:  16 18 15   Temp:      SpO2:  97% 98% 96%  Weight:      Height:      PainSc: 0-No pain       Isolation Precautions No active isolations  Medications Medications  sodium chloride 0.9 % bolus 1,000 mL (0 mLs Intravenous Stopped 10/30/16 1416)    Followed by  0.9 %  sodium chloride infusion (1,000 mLs Intravenous New Bag/Given 10/30/16 1447)  iopamidol (ISOVUE-300) 61 % injection (not administered)  cefTRIAXone (ROCEPHIN) 1 g in dextrose 5 % 50 mL IVPB (not administered)  ondansetron (ZOFRAN) injection 4 mg (4 mg Intravenous Given 10/30/16 1239)  morphine 4 MG/ML injection 4 mg (4 mg Intravenous Given 10/30/16 1240)  iopamidol (ISOVUE-300) 61 % injection 30 mL (30 mLs Oral Contrast Given 10/30/16 1411)  metoCLOPramide (REGLAN) injection 10 mg (10 mg Intravenous Given 10/30/16 1447)     Mobility walks with person assist

## 2016-10-30 NOTE — ED Notes (Signed)
Patient is resting comfortably. 

## 2016-10-30 NOTE — ED Notes (Signed)
Pt states she's feeling a bit nauseated and wanted an emesis bag and was also raised up to sit a little higher.

## 2016-10-31 LAB — BASIC METABOLIC PANEL
Anion gap: 9 (ref 5–15)
BUN: 50 mg/dL — ABNORMAL HIGH (ref 6–20)
CO2: 20 mmol/L — ABNORMAL LOW (ref 22–32)
Calcium: 8.4 mg/dL — ABNORMAL LOW (ref 8.9–10.3)
Chloride: 108 mmol/L (ref 101–111)
Creatinine, Ser: 3.32 mg/dL — ABNORMAL HIGH (ref 0.44–1.00)
GFR calc Af Amer: 14 mL/min — ABNORMAL LOW (ref >60)
GFR calc non Af Amer: 12 mL/min — ABNORMAL LOW (ref >60)
Glucose, Bld: 97 mg/dL (ref 65–99)
Potassium: 5 mmol/L (ref 3.5–5.1)
Sodium: 137 mmol/L (ref 135–145)

## 2016-10-31 LAB — CBC
HCT: 32.6 % — ABNORMAL LOW (ref 36.0–46.0)
Hemoglobin: 10.3 g/dL — ABNORMAL LOW (ref 12.0–15.0)
MCH: 25.8 pg — ABNORMAL LOW (ref 26.0–34.0)
MCHC: 31.6 g/dL (ref 30.0–36.0)
MCV: 81.5 fL (ref 78.0–100.0)
Platelets: 174 10*3/uL (ref 150–400)
RBC: 4 MIL/uL (ref 3.87–5.11)
RDW: 18 % — ABNORMAL HIGH (ref 11.5–15.5)
WBC: 6.8 10*3/uL (ref 4.0–10.5)

## 2016-10-31 LAB — PROTIME-INR
INR: 4.93
Prothrombin Time: 45.6 seconds — ABNORMAL HIGH (ref 11.4–15.2)

## 2016-10-31 LAB — GLUCOSE, CAPILLARY
Glucose-Capillary: 89 mg/dL (ref 65–99)
Glucose-Capillary: 137 mg/dL — ABNORMAL HIGH (ref 65–99)
Glucose-Capillary: 162 mg/dL — ABNORMAL HIGH (ref 65–99)

## 2016-10-31 MED ORDER — METOPROLOL TARTRATE 25 MG PO TABS
25.0000 mg | ORAL_TABLET | Freq: Two times a day (BID) | ORAL | Status: DC
  Administered 2016-10-31 – 2016-11-03 (×6): 25 mg via ORAL
  Filled 2016-10-31 (×6): qty 1

## 2016-10-31 MED ORDER — PREMIER PROTEIN SHAKE
11.0000 [oz_av] | ORAL | Status: DC
  Administered 2016-11-01 – 2016-11-02 (×2): 11 [oz_av] via ORAL
  Filled 2016-10-31 (×4): qty 325.31

## 2016-10-31 NOTE — Progress Notes (Signed)
Patient ID: Glena Pharris, female   DOB: [birth year], 83 y.o.   MRN: [id number]    PROGRESS NOTE    Paelyn Smick  ZJQ:[id number] DOB: 06-Dec-1931 DOA: 10/30/2016  PCP: Rogers Blocker, MD   Brief Narrative:  83 y.o. female with medical history significant of HTN, HLD, DM, a-fib on Coumadin, presented to [facility] ED with main concern of two weeks duration of progressively worsening abd discomfort, epigastric mostly, intermittent and dull, associated with nausea and non bloody vomiting, poor oral intake, fatigue. Pain is worse with eating and with no specific alleviating factors. Patient reports pain is 7-8/10 in severity and non radiating. Pt reports no similar events in the past. Patient denies chest pain or dyspnea, no specific urinary concern other that low urine volume.    ED Course: Pt is hemodynamically stable, VSS, preliminary urine analysis is worrisome for UTI. Blood work notable for Cr 3.8 (was 1.7 three days ago). TRH asked to admit for further evaluation.   Assessment & Plan:   Active Problems:   Acute renal failure (ARF) (HCC) - suspect pre renal etiology - metformin and lasix that pt has been taking at home have been held since admission  - pt was started on IVF and Cr is slowly trending down - with ongoing diarrhea, will continue IVF for now - repeat BMP in AM    Diarrhea, just started this AM - C. Diff and GI stool panel obtained - supportive care for now - IVF    Abd pain, N/V, UTI - preliminary UA suspicious for UTI which is possible cause of pt's symptoms - pt was started on Rocephin and will continue today day #2 - will need to follow up on urine cultures and narrow down ABX regimen as indicated  - there was a question of partial small bowel obstruction but pt has good BS, passing gas, has diarrhea this AM  - no vomiting since admission     A-fib, CHADS score 4 - has been on Coumadin but INR is supra therapeutic, Coumadin per pharmacy - pt has been  on Metoprolol and Cardizem  - may need to hold if HR < 60 but for now I will go ahead and lower the dose of Metoprolol from 50 mg to 25 mg PO BID    Hypothyroidism - continue synthroid     HLD - continue home regimen with statin    DM type II - hold Metformin and Glipizide for now until oral intake improves - continue SSI     HTN - cont home medical regimen   DVT prophylaxis: on Coumadin  Code Status: Full  Family Communication: Patient at bedside  Disposition Plan: to be determined   Consultants:   None  Procedures:   None  Antimicrobials:   Rocephin 9/3 -->   Subjective: Pt reports feeling better, improved appetite. Has had watery diarrhea since this AM.  Objective: Vitals:   10/30/16 1836 10/30/16 2005 10/31/16 0403 10/31/16 1347  BP: (!) 122/44 (!) 106/44 (!) 133/56 (!) 126/51  Pulse: 72 (!) 59 64 (!) 59  Resp: 20 20 19 18   Temp: 98.1 F (36.7 C) 98.3 F (36.8 C) 98.2 F (36.8 C)   TempSrc: Oral Oral Oral Oral  SpO2: 96% 95% 99% 97%  Weight: 81.9 kg (180 lb 8.9 oz)  81.9 kg (180 lb 8.9 oz)   Height: 5\' 2"  (1.575 m)       Intake/Output Summary (Last 24 hours) at 10/31/16 1733 Last data filed  at 10/31/16 1347  Gross per 24 hour  Intake              915 ml  Output              150 ml  Net              765 ml   Filed Weights   10/30/16 1120 10/30/16 1836 10/31/16 0403  Weight: 81.2 kg (179 lb) 81.9 kg (180 lb 8.9 oz) 81.9 kg (180 lb 8.9 oz)    Examination:  General exam: Appears calm and comfortable  Respiratory system: Clear to auscultation. Respiratory effort normal. Cardiovascular system: bradycardic, No JVD, murmurs, rubs, gallops or clicks. No pedal edema. Gastrointestinal system: Abdomen is nondistended, soft and nontender. No organomegaly or masses felt.  Central nervous system: Alert and oriented. No focal neurological deficits.  Data Reviewed: I have personally reviewed following labs and imaging studies  CBC:  Recent Labs Lab  10/25/16 1112 10/30/16 1232 10/31/16 0429  WBC 9.8 8.4 6.8  NEUTROABS 6.3 3.9  --   HGB 12.5 11.7* 10.3*  HCT 38.5 36.1 32.6*  MCV 81.7 82.2 81.5  PLT 224 216 623   Basic Metabolic Panel:  Recent Labs Lab 10/25/16 1112 10/30/16 1232 10/31/16 0429  NA 138 136 137  K 3.9 4.9 5.0  CL 99* 103 108  CO2 24 20* 20*  GLUCOSE 265* 161* 97  BUN 24* 52* 50*  CREATININE 1.72* 3.88* 3.32*  CALCIUM 10.6* 9.1 8.4*    Liver Function Tests:  Recent Labs Lab 10/25/16 1112 10/30/16 1232  AST 44* 27  ALT 37 21  ALKPHOS 118 87  BILITOT 0.7 0.8  PROT 8.6* 7.3  ALBUMIN 3.7 3.5    Recent Labs Lab 10/25/16 1112 10/30/16 1232  LIPASE 31 27   Coagulation Profile:  Recent Labs Lab 10/25/16 1112 10/30/16 2019 10/31/16 0429  INR 3.50 4.82* 4.93*   CBG:  Recent Labs Lab 10/30/16 2102 10/31/16 0745 10/31/16 1133 10/31/16 1636  GLUCAP 73 89 137* 162*   Urine analysis:    Component Value Date/Time   COLORURINE AMBER (A) 10/30/2016 1444   APPEARANCEUR CLOUDY (A) 10/30/2016 1444   LABSPEC 1.014 10/30/2016 1444   PHURINE 5.0 10/30/2016 1444   GLUCOSEU NEGATIVE 10/30/2016 1444   HGBUR MODERATE (A) 10/30/2016 1444   BILIRUBINUR NEGATIVE 10/30/2016 1444   BILIRUBINUR small 09/20/2011 1037   KETONESUR NEGATIVE 10/30/2016 1444   PROTEINUR NEGATIVE 10/30/2016 1444   UROBILINOGEN 0.2 11/18/2014 1941   NITRITE POSITIVE (A) 10/30/2016 1444   LEUKOCYTESUR MODERATE (A) 10/30/2016 1444   Radiology Studies: Ct Abdomen Pelvis Wo Contrast  Result Date: 10/30/2016 CLINICAL DATA:  83 year old female with abdominal pain, nausea and vomiting for 2 weeks. EXAM: CT ABDOMEN AND PELVIS WITHOUT CONTRAST TECHNIQUE: Multidetector CT imaging of the abdomen and pelvis was performed following the standard protocol without IV contrast. COMPARISON:  11/18/2014 and prior CTs FINDINGS: Please note that parenchymal abnormalities may be missed without intravenous contrast. Lower chest: No acute  abnormality. Bilateral lower lobe cylindrical bronchiectasis noted. Cardiomegaly and pacemaker lead identified. Hepatobiliary: The liver is unremarkable. The patient is status post cholecystectomy. No biliary dilatation. Pancreas: Atrophic without other abnormality Spleen: Unremarkable Adrenals/Urinary Tract: The kidneys, adrenal glands and bladder are unremarkable except for right renal cysts. Stomach/Bowel: Mildly distended proximal mid small bowel loops are noted with decreased caliber of distal small bowel loops. No abrupt transition point is identified. The stomach is distended. No bowel wall thickening or inflammatory  changes noted. Sigmoid colonic diverticulosis noted without evidence of diverticulitis. No evidence of pneumoperitoneum or abscess. Vascular/Lymphatic: Aortic atherosclerosis. No enlarged abdominal or pelvic lymph nodes. Reproductive: Status post hysterectomy. No adnexal masses. Other: No ascites. A small umbilical hernia containing fat and pelvic floor laxity are again identified. Musculoskeletal: No acute abnormality. No suspicious focal bony lesions. Compression of the L2 inferior endplate is unchanged. IMPRESSION: 1. Dilated proximal and mid small bowel loops with nondistended distal small bowel loops, but no evidence of abrupt transition point. Favor partial small bowel obstruction over ileus. No evidence of pneumoperitoneum, abscess or free fluid. 2. Bilateral lower lobe cylindrical bronchiectasis. 3. Cardiomegaly 4.  Aortic Atherosclerosis (ICD10-I70.0). Electronically Signed   By: Margarette Canada M.D.   On: 10/30/2016 16:17    Scheduled Meds: . diltiazem  180 mg Oral Daily  . ferrous gluconate  324 mg Oral QPM  . insulin aspart  0-9 Units Subcutaneous TID WC  . levothyroxine  25 mcg Oral QAC breakfast  . metoprolol tartrate  50 mg Oral BID  . potassium chloride SA  20 mEq Oral Daily  . protein supplement shake  11 oz Oral Q24H  . rosuvastatin  10 mg Oral Daily  . Warfarin -  Pharmacist Dosing Inpatient   Does not apply q1800   Continuous Infusions: . sodium chloride 75 mL/hr at 10/31/16 0938  . cefTRIAXone (ROCEPHIN)  IV 1 g (10/31/16 1732)     LOS: 1 day    Time spent: 25 minutes    Faye Ramsay, MD [facility] Pager [phone]  If 7PM-7AM, please contact night-coverage [email or web address] Password [facility] 10/31/2016, 5:33 PM

## 2016-10-31 NOTE — Progress Notes (Signed)
ANTICOAGULATION CONSULT NOTE - Initial Consult  Pharmacy Consult for Warfarin Indication: atrial fibrillation  No Known Allergies  Patient Measurements: Height: 5\' 2"  (157.5 cm) Weight: 180 lb 8.9 oz (81.9 kg) IBW/kg (Calculated) : 50.1  Vital Signs: Temp: 98.2 F (36.8 C) (09/04 0403) Temp Source: Oral (09/04 0403) BP: 133/56 (09/04 0403) Pulse Rate: 64 (09/04 0403)  Labs:  Recent Labs  10/30/16 1232 10/30/16 2019 10/31/16 0429  HGB 11.7*  --  10.3*  HCT 36.1  --  32.6*  PLT 216  --  174  LABPROT  --  44.8* 45.6*  INR  --  4.82* 4.93*  CREATININE 3.88*  --  3.32*    Estimated Creatinine Clearance: 13 mL/min (A) (by C-G formula based on SCr of 3.32 mg/dL (H)).   Medical History: Past Medical History:  Diagnosis Date  . Diverticulosis   . DM2 (diabetes mellitus, type 2) ([place])   . Esophageal reflux   . HTN (hypertension)   . Hyperlipidemia   . Hypothyroidism   . Microalbuminuria    last done in 1/07   . Paroxysmal atrial fibrillation (HCC)    and atrial tachycardia  . Stroke ([place]) 1999  . Tachycardia-bradycardia syndrome ([place])    s/p PPM  . Unspecified hypothyroidism 11/20/2013    Medications:  Prescriptions Prior to Admission  Medication Sig Dispense Refill Last Dose  . diltiazem (DILACOR XR) 180 MG 24 hr capsule Take 180 mg by mouth daily.   10/30/2016 at Unknown time  . FERGON 240 (27 FE) MG tablet Take 1 tablet by mouth every evening.   3 10/29/2016 at Unknown time  . furosemide (LASIX) 20 MG tablet Take 20 mg by mouth daily.   10/30/2016 at Unknown time  . glipiZIDE (GLUCOTROL) 10 MG tablet Take 5 mg by mouth daily.    10/30/2016 at Unknown time  . levothyroxine (SYNTHROID, LEVOTHROID) 25 MCG tablet Take 25 mcg by mouth daily before breakfast.   10/30/2016 at Unknown time  . metFORMIN (GLUCOPHAGE) 1000 MG tablet Take 1,000 mg by mouth 2 (two) times daily with a meal.   10/30/2016 at Unknown time  . metoprolol tartrate (LOPRESSOR) 25 MG tablet Take 50 mg by mouth  2 (two) times daily.    10/30/2016 at 1000  . ondansetron (ZOFRAN ODT) 4 MG disintegrating tablet Take 1 tablet (4 mg total) by mouth every 8 (eight) hours as needed for nausea. 10 tablet 0 10/29/2016 at Unknown time  . potassium chloride SA (K-DUR,KLOR-CON) 20 MEQ tablet Take 20 mEq by mouth daily.   10/29/2016 at Unknown time  . ranitidine (ZANTAC) 150 MG capsule Take 150 mg by mouth every morning.    10/29/2016 at Unknown time  . rosuvastatin (CRESTOR) 10 MG tablet Take 10 mg by mouth daily.   10/30/2016 at Unknown time  . warfarin (COUMADIN) 2.5 MG tablet Take 2.5 mg by mouth every other day.    10/29/2016 at Unknown time  . XOPENEX HFA 45 MCG/ACT inhaler Inhale 2 puffs into the lungs daily as needed for shortness of breath or wheezing.   unknown   Scheduled:  . diltiazem  180 mg Oral Daily  . ferrous gluconate  324 mg Oral QPM  . insulin aspart  0-9 Units Subcutaneous TID WC  . levothyroxine  25 mcg Oral QAC breakfast  . metoprolol tartrate  50 mg Oral BID  . potassium chloride SA  20 mEq Oral Daily  . rosuvastatin  10 mg Oral Daily  . Warfarin - Pharmacist Dosing  Inpatient   Does not apply q1800    Assessment: 59 yoF admitted on 9/3 with weakness, abdominal pain, N/V, poor oral intake and fatigue.  PMH includes Afib on chronic warfarin anticoagulation.  Pharmacy is consulted to resume warfarin dosing inpatient. Admission INR supratherapeutic at 4.82.  Home warfarin dose: 2.5 mg every other day.  Last dose on 9/2.  Today, 10/31/2016  INR supratherapeutic and rising (4.93)  Hgb low 10.3, Plts wnl  No bleeding reported  SCr 3.32 is acutely elevated (1.72 on 10/25/16)  Drug-drug interactions: antibiotics may increase INR   Goal of Therapy:  INR 2-3 Monitor platelets by anticoagulation protocol: Yes   Plan:  No warfarin dose today Daily PT/INR. Monitor for signs and symptoms of bleeding.  Peggyann Juba, PharmD, BCPS Pager: [phone] 10/31/2016 7:38 AM

## 2016-10-31 NOTE — Progress Notes (Signed)
Initial Nutrition Assessment  DOCUMENTATION CODES:   Obesity unspecified  INTERVENTION:   Provide Premier Protein Q24, each supplement provides 160kcal and 30g protein.   NUTRITION DIAGNOSIS:   Inadequate oral intake related to nausea, vomiting as evidenced by per patient/family report.  GOAL:   Patient will meet greater than or equal to 90% of their needs  MONITOR:   PO intake, Supplement acceptance, Labs, Weight trends  REASON FOR ASSESSMENT:   Malnutrition Screening Tool    ASSESSMENT:   Pt with PMH of HTN. HLD, DM, diverticulosis, and stroke. Presents this admission with ARF and abdominal pain. CT questionable partial small bowel obstruction but pt passing gas.    Spoke with pt at bedside. Pt had loss in appetite two weeks prior to admission related to nausea/vomiting. States she typically consumes three meals per day but dropped to two meals in the past two weeks. Pt does not use supplementation at home. Pt currently on regular diet consuming 90% of her last meal. Records indicate pt has lost 4% of body wt in 5 months. This is not significant. Nutrition-Focused physical exam completed. Findings are no fat depletion, no muscle depletion, and no edema. Will provide supplementation and monitor PO intake closely.   Medications reviewed and include: SSI, KCl, NS 75 ml/hr, IV abx Labs reviewed: CO2 20 (L) BUN 50 (H) Creatinine 3.32 (H) AST 44 (H)    Diet Order:  Diet regular Room service appropriate? Yes; Fluid consistency: Thin  Skin:  Reviewed, no issues  Last BM:  10/31/16  Height:   Ht Readings from Last 1 Encounters:  10/30/16 5\' 2"  (1.575 m)    Weight:   Wt Readings from Last 1 Encounters:  10/31/16 180 lb 8.9 oz (81.9 kg)    Ideal Body Weight:  50 kg  BMI:  Body mass index is 33.02 kg/m.  Estimated Nutritional Needs:   Kcal:  1238 (MSJ)  Protein:  65-75 grams (1.3-1.5 g/kg IBW)  Fluid:  >1.2 L/day  EDUCATION NEEDS:   No education needs  identified at this time  [place], LDN Clinical Nutrition Pager # - [phone]

## 2016-11-01 DIAGNOSIS — N179 Acute kidney failure, unspecified: Principal | ICD-10-CM

## 2016-11-01 LAB — CBC
HCT: 29.7 % — ABNORMAL LOW (ref 36.0–46.0)
Hemoglobin: 9.5 g/dL — ABNORMAL LOW (ref 12.0–15.0)
MCH: 26.5 pg (ref 26.0–34.0)
MCHC: 32 g/dL (ref 30.0–36.0)
MCV: 83 fL (ref 78.0–100.0)
Platelets: 156 10*3/uL (ref 150–400)
RBC: 3.58 MIL/uL — ABNORMAL LOW (ref 3.87–5.11)
RDW: 18.4 % — ABNORMAL HIGH (ref 11.5–15.5)
WBC: 6.1 10*3/uL (ref 4.0–10.5)

## 2016-11-01 LAB — GLUCOSE, CAPILLARY
Glucose-Capillary: 133 mg/dL — ABNORMAL HIGH (ref 65–99)
Glucose-Capillary: 183 mg/dL — ABNORMAL HIGH (ref 65–99)
Glucose-Capillary: 191 mg/dL — ABNORMAL HIGH (ref 65–99)
Glucose-Capillary: 209 mg/dL — ABNORMAL HIGH (ref 65–99)
Glucose-Capillary: 191 mg/dL — ABNORMAL HIGH (ref 65–99)

## 2016-11-01 LAB — TSH: TSH: 1.588 u[IU]/mL (ref 0.350–4.500)

## 2016-11-01 LAB — BASIC METABOLIC PANEL
Anion gap: 6 (ref 5–15)
BUN: 40 mg/dL — ABNORMAL HIGH (ref 6–20)
CO2: 18 mmol/L — ABNORMAL LOW (ref 22–32)
Calcium: 7.4 mg/dL — ABNORMAL LOW (ref 8.9–10.3)
Chloride: 112 mmol/L — ABNORMAL HIGH (ref 101–111)
Creatinine, Ser: 2.58 mg/dL — ABNORMAL HIGH (ref 0.44–1.00)
GFR calc Af Amer: 19 mL/min — ABNORMAL LOW (ref >60)
GFR calc non Af Amer: 16 mL/min — ABNORMAL LOW (ref >60)
Glucose, Bld: 187 mg/dL — ABNORMAL HIGH (ref 65–99)
Potassium: 4.4 mmol/L (ref 3.5–5.1)
Sodium: 136 mmol/L (ref 135–145)

## 2016-11-01 LAB — HEMOGLOBIN A1C
Hgb A1c MFr Bld: 7.8 % — ABNORMAL HIGH (ref 4.8–5.6)
Mean Plasma Glucose: 177.16 mg/dL

## 2016-11-01 LAB — PROTIME-INR
INR: 6.06
Prothrombin Time: 53.5 seconds — ABNORMAL HIGH (ref 11.4–15.2)

## 2016-11-01 MED ORDER — SODIUM CHLORIDE 0.9 % IV SOLN
INTRAVENOUS | Status: DC
  Administered 2016-11-01 (×2): via INTRAVENOUS

## 2016-11-01 NOTE — Progress Notes (Signed)
Patient ID: Tiffany [name], female   DOB: 10-Jan-1932, 83 y.o.   MRN: [id number]    PROGRESS NOTE    Chrisanna Mishra  LKG:[id number] DOB: 1931-12-21 DOA: 10/30/2016  PCP: Rogers Blocker, MD   Brief Narrative:  83 y.o. female with medical history significant of HTN, HLD, DM, a-fib on Coumadin, presented to [facility] ED with main concern of two weeks duration of diarrhea,. Epigastric discomfort, associated with nausea and non bloody vomiting, poor oral intake since 2days. VSS, preliminary urine analysis is worrisome for UTI. Blood work notable for Cr 3.8 (was 1.7 three days ago). TRH asked to admit for further evaluation.   Assessment & Plan:     Acute renal failure (ARF) ([place]) - suspect pre renal etiology from GI losses -baseline creatinine around 1.4, it was 3.8 on admission - metformin and lasix that pt has been taking at home have been held since admission  - improving, continue IVF today, Bmet in am    Diarrhea, just started this AM - off and on for 2 weeks following recent Abx use - improving, afebrile and WBC normal - CT with ? PSBO but clinically do not suspect this -no vomiting, tolerating PO and having BMs - FU GI pathogen panel - continue supportive care   Suspected UTI -Urine Cx with 100K Klebsiella and 50K Enterococcus -change ceftriaxone to Keflex for 3days    A-fib, CHADS score 4 - has been on Coumadin but INR is supra therapeutic, Coumadin per pharmacy - continue Metoprolol and Cardizem     Hypothyroidism - continue synthroid     HLD - continue home regimen with statin    DM type II - hold Metformin and Glipizide for now until oral intake improves - continue SSI     HTN - cont home medical regimen   DVT prophylaxis: on Coumadin  Code Status: Full  Family Communication: Patient at bedside  Disposition Plan: home in 1-2days  Consultants:   None  Procedures:   None  Antimicrobials:   Rocephin 9/3 -->   Subjective: Diarrhea much  better, no vomiting, abd feels better  Objective: Vitals:   10/31/16 2040 11/01/16 0500 11/01/16 0614 11/01/16 1029  BP: 128/61  (!) 122/50 (!) 136/50  Pulse: 62  62 67  Resp: 18  (!) 24   Temp: 97.9 F (36.6 C)  97.9 F (36.6 C)   TempSrc: Oral  Oral   SpO2: 97%  95%   Weight:  85 kg (187 lb 6.3 oz)    Height:        Intake/Output Summary (Last 24 hours) at 11/01/16 1337 Last data filed at 11/01/16 1208  Gross per 24 hour  Intake             1610 ml  Output              300 ml  Net             1310 ml   Filed Weights   10/30/16 1836 10/31/16 0403 11/01/16 0500  Weight: 81.9 kg (180 lb 8.9 oz) 81.9 kg (180 lb 8.9 oz) 85 kg (187 lb 6.3 oz)    Examination: Gen: Awake, Alert, Oriented X 3,  HEENT: PERRLA, Neck supple, no JVD Lungs: Good air movement bilaterally, CTAB CVS: RRR,No Gallops,Rubs or new Murmurs Abd: soft, Non tender, non distended, BS present Extremities: No Cyanosis, Clubbing or edema Skin: no new rashes  Data Reviewed: I have personally reviewed following labs and imaging studies  CBC:  Recent Labs Lab 10/30/16 1232 10/31/16 0429 11/01/16 0531  WBC 8.4 6.8 6.1  NEUTROABS 3.9  --   --   HGB 11.7* 10.3* 9.5*  HCT 36.1 32.6* 29.7*  MCV 82.2 81.5 83.0  PLT 216 174 220   Basic Metabolic Panel:  Recent Labs Lab 10/30/16 1232 10/31/16 0429 11/01/16 0531  NA 136 137 136  K 4.9 5.0 4.4  CL 103 108 112*  CO2 20* 20* 18*  GLUCOSE 161* 97 187*  BUN 52* 50* 40*  CREATININE 3.88* 3.32* 2.58*  CALCIUM 9.1 8.4* 7.4*    Liver Function Tests:  Recent Labs Lab 10/30/16 1232  AST 27  ALT 21  ALKPHOS 87  BILITOT 0.8  PROT 7.3  ALBUMIN 3.5    Recent Labs Lab 10/30/16 1232  LIPASE 27   Coagulation Profile:  Recent Labs Lab 10/30/16 2019 10/31/16 0429 11/01/16 0531  INR 4.82* 4.93* 6.06*   CBG:  Recent Labs Lab 10/31/16 1133 10/31/16 1636 10/31/16 2144 11/01/16 0743 11/01/16 1153  GLUCAP 137* 162* 133* 183* 191*   Urine  analysis:    Component Value Date/Time   COLORURINE AMBER (A) 10/30/2016 1444   APPEARANCEUR CLOUDY (A) 10/30/2016 1444   LABSPEC 1.014 10/30/2016 1444   PHURINE 5.0 10/30/2016 1444   GLUCOSEU NEGATIVE 10/30/2016 1444   HGBUR MODERATE (A) 10/30/2016 1444   BILIRUBINUR NEGATIVE 10/30/2016 1444   BILIRUBINUR small 09/20/2011 1037   KETONESUR NEGATIVE 10/30/2016 1444   PROTEINUR NEGATIVE 10/30/2016 1444   UROBILINOGEN 0.2 11/18/2014 1941   NITRITE POSITIVE (A) 10/30/2016 1444   LEUKOCYTESUR MODERATE (A) 10/30/2016 1444   Radiology Studies: Ct Abdomen Pelvis Wo Contrast  Result Date: 10/30/2016 CLINICAL DATA:  83 year old female with abdominal pain, nausea and vomiting for 2 weeks. EXAM: CT ABDOMEN AND PELVIS WITHOUT CONTRAST TECHNIQUE: Multidetector CT imaging of the abdomen and pelvis was performed following the standard protocol without IV contrast. COMPARISON:  11/18/2014 and prior CTs FINDINGS: Please note that parenchymal abnormalities may be missed without intravenous contrast. Lower chest: No acute abnormality. Bilateral lower lobe cylindrical bronchiectasis noted. Cardiomegaly and pacemaker lead identified. Hepatobiliary: The liver is unremarkable. The patient is status post cholecystectomy. No biliary dilatation. Pancreas: Atrophic without other abnormality Spleen: Unremarkable Adrenals/Urinary Tract: The kidneys, adrenal glands and bladder are unremarkable except for right renal cysts. Stomach/Bowel: Mildly distended proximal mid small bowel loops are noted with decreased caliber of distal small bowel loops. No abrupt transition point is identified. The stomach is distended. No bowel wall thickening or inflammatory changes noted. Sigmoid colonic diverticulosis noted without evidence of diverticulitis. No evidence of pneumoperitoneum or abscess. Vascular/Lymphatic: Aortic atherosclerosis. No enlarged abdominal or pelvic lymph nodes. Reproductive: Status post hysterectomy. No adnexal masses.  Other: No ascites. A small umbilical hernia containing fat and pelvic floor laxity are again identified. Musculoskeletal: No acute abnormality. No suspicious focal bony lesions. Compression of the L2 inferior endplate is unchanged. IMPRESSION: 1. Dilated proximal and mid small bowel loops with nondistended distal small bowel loops, but no evidence of abrupt transition point. Favor partial small bowel obstruction over ileus. No evidence of pneumoperitoneum, abscess or free fluid. 2. Bilateral lower lobe cylindrical bronchiectasis. 3. Cardiomegaly 4.  Aortic Atherosclerosis (ICD10-I70.0). Electronically Signed   By: Margarette Canada M.D.   On: 10/30/2016 16:17    Scheduled Meds: . diltiazem  180 mg Oral Daily  . ferrous gluconate  324 mg Oral QPM  . insulin aspart  0-9 Units Subcutaneous TID WC  . levothyroxine  25 mcg Oral QAC breakfast  . metoprolol tartrate  25 mg Oral BID  . potassium chloride SA  20 mEq Oral Daily  . protein supplement shake  11 oz Oral Q24H  . rosuvastatin  10 mg Oral Daily  . Warfarin - Pharmacist Dosing Inpatient   Does not apply q1800   Continuous Infusions: . sodium chloride 75 mL/hr at 11/01/16 1232  . cefTRIAXone (ROCEPHIN)  IV 1 g (10/31/16 1732)     LOS: 2 days    Time spent: 25 minutes    Domenic Polite, MD [facility] Pager [phone]  If 7PM-7AM, please contact night-coverage [email or web address] Password [email or web address] 11/01/2016, 1:37 PM

## 2016-11-01 NOTE — Progress Notes (Signed)
ANTICOAGULATION CONSULT NOTE - Initial Consult  Pharmacy Consult for Warfarin Indication: atrial fibrillation  No Known Allergies  Patient Measurements: Height: 5\' 2"  (157.5 cm) Weight: 187 lb 6.3 oz (85 kg) IBW/kg (Calculated) : 50.1  Vital Signs: Temp: 97.9 F (36.6 C) (09/05 0614) Temp Source: Oral (09/05 0614) BP: 122/50 (09/05 0614) Pulse Rate: 62 (09/05 0614)  Labs:  Recent Labs  10/30/16 1232 10/30/16 2019 10/31/16 0429 11/01/16 0531  HGB 11.7*  --  10.3* 9.5*  HCT 36.1  --  32.6* 29.7*  PLT 216  --  174 156  LABPROT  --  44.8* 45.6* 53.5*  INR  --  4.82* 4.93* 6.06*  CREATININE 3.88*  --  3.32* 2.58*    Estimated Creatinine Clearance: 17 mL/min (A) (by C-G formula based on SCr of 2.58 mg/dL (H)).   Medical History: Past Medical History:  Diagnosis Date  . Diverticulosis   . DM2 (diabetes mellitus, type 2) ([place])   . Esophageal reflux   . HTN (hypertension)   . Hyperlipidemia   . Hypothyroidism   . Microalbuminuria    last done in 1/07   . Paroxysmal atrial fibrillation ([facility])    and atrial tachycardia  . Stroke ([place]) 1999  . Tachycardia-bradycardia syndrome ([place])    s/p PPM  . Unspecified hypothyroidism 11/20/2013    Medications:  Prescriptions Prior to Admission  Medication Sig Dispense Refill Last Dose  . diltiazem (DILACOR XR) 180 MG 24 hr capsule Take 180 mg by mouth daily.   10/30/2016 at Unknown time  . FERGON 240 (27 FE) MG tablet Take 1 tablet by mouth every evening.   3 10/29/2016 at Unknown time  . furosemide (LASIX) 20 MG tablet Take 20 mg by mouth daily.   10/30/2016 at Unknown time  . glipiZIDE (GLUCOTROL) 10 MG tablet Take 5 mg by mouth daily.    10/30/2016 at Unknown time  . levothyroxine (SYNTHROID, LEVOTHROID) 25 MCG tablet Take 25 mcg by mouth daily before breakfast.   10/30/2016 at Unknown time  . metFORMIN (GLUCOPHAGE) 1000 MG tablet Take 1,000 mg by mouth 2 (two) times daily with a meal.   10/30/2016 at Unknown time  . metoprolol  tartrate (LOPRESSOR) 25 MG tablet Take 50 mg by mouth 2 (two) times daily.    10/30/2016 at 1000  . ondansetron (ZOFRAN ODT) 4 MG disintegrating tablet Take 1 tablet (4 mg total) by mouth every 8 (eight) hours as needed for nausea. 10 tablet 0 10/29/2016 at Unknown time  . potassium chloride SA (K-DUR,KLOR-CON) 20 MEQ tablet Take 20 mEq by mouth daily.   10/29/2016 at Unknown time  . ranitidine (ZANTAC) 150 MG capsule Take 150 mg by mouth every morning.    10/29/2016 at Unknown time  . rosuvastatin (CRESTOR) 10 MG tablet Take 10 mg by mouth daily.   10/30/2016 at Unknown time  . warfarin (COUMADIN) 2.5 MG tablet Take 2.5 mg by mouth every other day.    10/29/2016 at Unknown time  . XOPENEX HFA 45 MCG/ACT inhaler Inhale 2 puffs into the lungs daily as needed for shortness of breath or wheezing.   unknown   Scheduled:  . diltiazem  180 mg Oral Daily  . ferrous gluconate  324 mg Oral QPM  . insulin aspart  0-9 Units Subcutaneous TID WC  . levothyroxine  25 mcg Oral QAC breakfast  . metoprolol tartrate  25 mg Oral BID  . potassium chloride SA  20 mEq Oral Daily  . protein supplement shake  11 oz Oral Q24H  . rosuvastatin  10 mg Oral Daily  . Warfarin - Pharmacist Dosing Inpatient   Does not apply q1800    Assessment: 33 yoF admitted on 9/3 with weakness, abdominal pain, N/V, poor oral intake and fatigue.  PMH includes Afib on chronic warfarin anticoagulation.  Pharmacy is consulted to resume warfarin dosing inpatient. Admission INR supratherapeutic at 4.82.  Home warfarin dose: 2.5 mg every other day.  Last dose on 9/2.  Today, 11/01/2016  INR supratherapeutic and rising (6.06) - possibly from vitamin K losses with diarrhea  Hgb low 9.5, Plts wnl but trending down  No bleeding reported  SCr 2.58 is acutely elevated but improving (1.72 on 10/25/16)  Regular diet + Premier Protein ordered  Drug-drug interactions: antibiotics may increase INR   Goal of Therapy:  INR 2-3 Monitor platelets by  anticoagulation protocol: Yes   Plan:  No warfarin dose today Daily PT/INR. Monitor for signs and symptoms of bleeding.  Peggyann Juba, PharmD, BCPS Pager: [phone] 11/01/2016 7:10 AM

## 2016-11-01 NOTE — Progress Notes (Signed)
CRITICAL VALUE ALERT  Critical Value:  INR 6.06  Date & Time Notied:  11/01/16 0700  Provider Notified: Opyd  Orders Received/Actions taken:

## 2016-11-01 NOTE — Plan of Care (Signed)
Problem: Safety: Goal: Ability to remain free from injury will improve Outcome: Completed/Met Date Met: 11/01/16 Bed alarm set. Pt is aware to call when needing assistance out of the bed.

## 2016-11-01 NOTE — Plan of Care (Signed)
Problem: Education: Goal: Knowledge of disease and its progression will improve Outcome: Completed/Met Date Met: 11/01/16 Provider discussed current result kidney function, pt to receive IVF x 1 more day. Pt verbalized understanding

## 2016-11-01 NOTE — Progress Notes (Signed)
PT Cancellation Note  Patient Details Name: Tiffany [name] MRN: [id number] DOB: February 28, 1931   Cancelled Treatment:    Reason Eval/Treat Not Completed: Fatigue/lethargy limiting ability to participate, has been up to [facility] recently, noted SOB.  Will check back in AM .   [name], [name] 11/01/2016, 4:44 PM [name] PT [phone]

## 2016-11-02 ENCOUNTER — Encounter (HOSPITAL_COMMUNITY): Payer: Self-pay

## 2016-11-02 DIAGNOSIS — D6832 Hemorrhagic disorder due to extrinsic circulating anticoagulants: Secondary | ICD-10-CM

## 2016-11-02 DIAGNOSIS — N3000 Acute cystitis without hematuria: Secondary | ICD-10-CM

## 2016-11-02 DIAGNOSIS — T45515A Adverse effect of anticoagulants, initial encounter: Secondary | ICD-10-CM

## 2016-11-02 LAB — BASIC METABOLIC PANEL
Anion gap: 8 (ref 5–15)
BUN: 34 mg/dL — ABNORMAL HIGH (ref 6–20)
CO2: 18 mmol/L — ABNORMAL LOW (ref 22–32)
Calcium: 7.5 mg/dL — ABNORMAL LOW (ref 8.9–10.3)
Chloride: 116 mmol/L — ABNORMAL HIGH (ref 101–111)
Creatinine, Ser: 2.2 mg/dL — ABNORMAL HIGH (ref 0.44–1.00)
GFR calc Af Amer: 23 mL/min — ABNORMAL LOW (ref >60)
GFR calc non Af Amer: 20 mL/min — ABNORMAL LOW (ref >60)
Glucose, Bld: 211 mg/dL — ABNORMAL HIGH (ref 65–99)
Potassium: 4.9 mmol/L (ref 3.5–5.1)
Sodium: 142 mmol/L (ref 135–145)

## 2016-11-02 LAB — URINE CULTURE: Culture: >=100000 — AB

## 2016-11-02 LAB — CBC
HCT: 29.8 % — ABNORMAL LOW (ref 36.0–46.0)
Hemoglobin: 9.6 g/dL — ABNORMAL LOW (ref 12.0–15.0)
MCH: 26.8 pg (ref 26.0–34.0)
MCHC: 32.2 g/dL (ref 30.0–36.0)
MCV: 83.2 fL (ref 78.0–100.0)
Platelets: 180 10*3/uL (ref 150–400)
RBC: 3.58 MIL/uL — ABNORMAL LOW (ref 3.87–5.11)
RDW: 18.5 % — ABNORMAL HIGH (ref 11.5–15.5)
WBC: 6.7 10*3/uL (ref 4.0–10.5)

## 2016-11-02 LAB — GLUCOSE, CAPILLARY
Glucose-Capillary: 181 mg/dL — ABNORMAL HIGH (ref 65–99)
Glucose-Capillary: 199 mg/dL — ABNORMAL HIGH (ref 65–99)
Glucose-Capillary: 214 mg/dL — ABNORMAL HIGH (ref 65–99)
Glucose-Capillary: 210 mg/dL — ABNORMAL HIGH (ref 65–99)

## 2016-11-02 LAB — PROTIME-INR
INR: 6.27
Prothrombin Time: 55 seconds — ABNORMAL HIGH (ref 11.4–15.2)

## 2016-11-02 MED ORDER — CEPHALEXIN 250 MG PO CAPS
250.0000 mg | ORAL_CAPSULE | Freq: Three times a day (TID) | ORAL | Status: DC
  Administered 2016-11-02 – 2016-11-03 (×3): 250 mg via ORAL
  Filled 2016-11-02 (×5): qty 1

## 2016-11-02 MED ORDER — IPRATROPIUM-ALBUTEROL 0.5-2.5 (3) MG/3ML IN SOLN
3.0000 mL | Freq: Three times a day (TID) | RESPIRATORY_TRACT | Status: DC
  Administered 2016-11-03: 3 mL via RESPIRATORY_TRACT
  Filled 2016-11-02: qty 3

## 2016-11-02 MED ORDER — FUROSEMIDE 10 MG/ML IJ SOLN
20.0000 mg | Freq: Once | INTRAMUSCULAR | Status: AC
  Administered 2016-11-02: 20 mg via INTRAVENOUS
  Filled 2016-11-02: qty 2

## 2016-11-02 NOTE — Evaluation (Signed)
Occupational Therapy Evaluation Patient Details Name: Tiffany [name] MRN: [id number] DOB: Apr 06, 1931 Today's Date: 11/02/2016    History of Present Illness 83 y.o. female with medical history significant of HTN, HLD, DM, a-fib on Coumadin, presented to [facility] ED with main concern of two weeks duration of diarrhea,. Epigastric discomfort, associated with nausea and non bloody vomiting, poor oral intake since 2days. VSS, preliminary urine analysis is worrisome for UTI.   Clinical Impression   Pt was admitted for the above. At baseline, pt is independent with adls. She is deconditioned at this time and activity brings on wheezing. She will benefit from continued OT to increase safety and independence with adls. Goals in acute are for supervision level. She currently needs min A     Follow Up Recommendations  SNF (vs 24/7) with [place]   Equipment Recommendations  3 in 1 bedside commode    Recommendations for Other Services       Precautions / Restrictions Precautions Precautions: Fall Restrictions Weight Bearing Restrictions: No      Mobility Bed Mobility               General bed mobility comments: supervision  Transfers Overall transfer level: Needs assistance   Transfers: Sit to/from Stand;Stand Pivot Transfers Sit to Stand: Min assist Stand pivot transfers: Min assist       General transfer comment: steadying assistance off of 3:1 commode    Balance Overall balance assessment: Needs assistance Sitting-balance support: No upper extremity supported;Feet supported Sitting balance-Leahy Scale: Good     Standing balance support: During functional activity;Bilateral upper extremity supported Standing balance-Leahy Scale: Poor Standing balance comment: relies on arms                           ADL either performed or assessed with clinical judgement   ADL Overall ADL's : Needs assistance/impaired     Grooming: Standing;Set up   Upper Body  Bathing: Set up;Sitting   Lower Body Bathing: Minimal assistance;Sit to/from stand   Upper Body Dressing : Set up;Sitting   Lower Body Dressing: Minimal assistance;Sit to/from stand   Toilet Transfer: Minimal assistance;Ambulation;BSC;RW   Toileting- Clothing Manipulation and Hygiene: Moderate assistance;Sit to/from stand         General ADL Comments: Pt ambulated around bed after using 3:1 commode. Pt fatiques easily and is wheezy (RN aware of wheezing). Sats 95% on RA but DOE 2-3/4.  Daughter present during part of session     Vision         Perception     Praxis      Pertinent Vitals/Pain Pain Assessment: No/denies pain     Hand Dominance     Extremity/Trunk Assessment Upper Extremity Assessment Upper Extremity Assessment: Generalized weakness      Cervical / Trunk Assessment Cervical / Trunk Assessment: Normal   Communication Communication Communication: No difficulties   Cognition Arousal/Alertness: Awake/alert Behavior During Therapy: WFL for tasks assessed/performed Overall Cognitive Status: Within Functional Limits for tasks assessed                                     General Comments       Exercises     Shoulder Instructions      Home Living Family/patient expects to be discharged to:: Private residence Living Arrangements: Alone   Type of Home: Apartment Home Access: Level entry  Home Layout: One level         Bathroom Toilet: Handicapped height     Home Equipment: Walker - 2 wheels;Cane - single point   Additional Comments: daughters may not be available to assist more than PTA      Prior Functioning/Environment Level of Independence: Independent with assistive device(s)        Comments: does not drive now, daughter assists with  groceries        OT Problem List: Decreased strength;Decreased activity tolerance;Impaired balance (sitting and/or standing);Decreased knowledge of use of DME or  AE;Cardiopulmonary status limiting activity      OT Treatment/Interventions: Self-care/ADL training;Energy conservation;Balance training;Patient/family education;Therapeutic activities    OT Goals(Current goals can be found in the care plan section) Acute Rehab OT Goals Patient Stated Goal: to feel stronger to go home OT Goal Formulation: With patient Time For Goal Achievement: 11/09/16 Potential to Achieve Goals: Good ADL Goals Pt Will Transfer to Toilet: with supervision;bedside commode;ambulating Pt Will Perform Toileting - Clothing Manipulation and hygiene: with supervision;sit to/from stand Additional ADL Goal #1: pt will complete adl with supervision, sit to stand, initiating at least one rest break for energy conservation  OT Frequency: Min 2X/week   Barriers to D/C:            Co-evaluation              AM-PAC PT "6 Clicks" Daily Activity     Outcome Measure Help from another person eating meals?: None Help from another person taking care of personal grooming?: A Little Help from another person toileting, which includes using toliet, bedpan, or urinal?: A Little Help from another person bathing (including washing, rinsing, drying)?: A Little Help from another person to put on and taking off regular upper body clothing?: A Little Help from another person to put on and taking off regular lower body clothing?: A Little 6 Click Score: 19   End of Session    Activity Tolerance: Patient limited by fatigue Patient left: in bed;with call bell/phone within reach;with family/visitor present  OT Visit Diagnosis: Unsteadiness on feet (R26.81)                Time: 5809-9833 OT Time Calculation (min): 25 min Charges:  OT General Charges $OT Visit: 1 Visit OT Evaluation $OT Eval Low Complexity: 1 Low OT Treatments $Self Care/Home Management : 8-22 mins G-Codes:     [place], OTR/L [phone] 11/02/2016  [place] 11/02/2016, 1:56 PM

## 2016-11-02 NOTE — Care Management Important Message (Signed)
Important Message  Patient Details  Name: Christene Pounds MRN: [id number] Date of Birth: [birth year]   Medicare Important Message Given:  Yes    Kerin Salen 11/02/2016, 11:06 AMImportant Message  Patient Details  Name: Shayra Anton MRN: [id number] Date of Birth: 06-10-1931   Medicare Important Message Given:  Yes    Kerin Salen 11/02/2016, 11:06 AM

## 2016-11-02 NOTE — Progress Notes (Addendum)
ANTICOAGULATION CONSULT NOTE - Follow-Up Consult  Pharmacy Consult for Warfarin Indication: atrial fibrillation, stroke  No Known Allergies  Patient Measurements: Height: 5\' 2"  (157.5 cm) Weight: 182 lb 1.6 oz (82.6 kg) IBW/kg (Calculated) : 50.1  Vital Signs: Temp: 98 F (36.7 C) (09/06 0447) Temp Source: Oral (09/06 0447) BP: 145/50 (09/06 0447) Pulse Rate: 66 (09/06 0447)  Labs:  Recent Labs  10/31/16 0429 11/01/16 0531 11/02/16 0437  HGB 10.3* 9.5* 9.6*  HCT 32.6* 29.7* 29.8*  PLT 174 156 180  LABPROT 45.6* 53.5* 55.0*  INR 4.93* 6.06* 6.27*  CREATININE 3.32* 2.58* 2.20*    Estimated Creatinine Clearance: 19.6 mL/min (A) (by C-G formula based on SCr of 2.2 mg/dL (H)).   Medical History: Past Medical History:  Diagnosis Date  . Diverticulosis   . DM2 (diabetes mellitus, type 2) ([place])   . Esophageal reflux   . HTN (hypertension)   . Hyperlipidemia   . Hypothyroidism   . Microalbuminuria    last done in 1/07   . Paroxysmal atrial fibrillation ([facility])    and atrial tachycardia  . Stroke ([place]) 1999  . Tachycardia-bradycardia syndrome ([place])    s/p PPM  . Unspecified hypothyroidism 11/20/2013    Assessment: 6 yoF admitted on 9/3 with weakness, abdominal pain, N/V, poor oral intake and fatigue. PMH includes a-fib and stroke on chronic warfarin anticoagulation.  Pharmacy is consulted to resume warfarin dosing inpatient. Admission INR supratherapeutic at 4.82.  Home warfarin dose: 2.5 mg every other day.  Last dose on 9/2.  Today, 11/02/2016  INR supratherapeutic and rising (6.27) - possibly from vitamin K losses with diarrhea (diarrhea now resolved)  Hgb low but stable at 9.6, Pltc WNL  No bleeding reported  SCr 2.2, acutely elevated but improving (1.72 on 10/25/16)  Regular diet + Premier Protein ordered  Drug-drug interactions: antibiotics may increase INR   Goal of Therapy:  INR 2-3 Monitor platelets by anticoagulation protocol: Yes   Plan:   Continue to hold warfarin today. Daily PT/INR. Monitor CBC and for signs/symptoms of bleeding.   Lindell Spar, PharmD, BCPS Pager: [phone] 11/02/2016 10:50 AM

## 2016-11-02 NOTE — Progress Notes (Signed)
Patient ID: Tiffany [name], female   DOB: Sep 26, 1931, 83 y.o.   MRN: [id number]    PROGRESS NOTE    Tiffany [name]  GLO:[id number] DOB: 11/22/1931 DOA: 10/30/2016  PCP: [name], MD   Brief Narrative:  83 y.o. female with medical history significant of HTN, HLD, DM, a-fib on Coumadin, presented to [facility] ED with main concern of two weeks duration of diarrhea,. Epigastric discomfort, associated with nausea and non bloody vomiting, poor oral intake since 2days. VSS, preliminary urine analysis is worrisome for UTI. Blood work notable for Cr 3.8 (was 1.7 three days ago). TRH asked to admit for further evaluation.   Assessment & Plan:     Acute renal failure (ARF) ([place]) - suspect pre renal etiology from GI losses and possibly some ATN too -baseline creatinine around 1.4, it was 3.8 on admission - metformin and lasix have been held since admission  - improving, down to 2.2 now but clinically volume overloaded and hence will stop IVF and given lasix 20mg  now -check Bmet in am    Diarrhea - off and on for 2 weeks following recent Abx use - improving, afebrile and WBC normal - CT with ? PSBO but clinically do not suspect this -no vomiting, tolerating PO and having BMs - GI pathogen panel-couldn't be sent due to lack of diarrhea - continue supportive care   Suspected UTI -Urine Cx with 100K Klebsiella and 50K Enterococcus -changed ceftriaxone to keflex, continue to complete 7day course    A-fib, CHADS score 4 - has been on Coumadin but INR is supra therapeutic, Coumadin per pharmacy - continue Metoprolol and Cardizem  -coumadin has been held with INR >6    Hypothyroidism - continue synthroid     HLD - continue home regimen with statin    DM type II - hold Metformin and Glipizide for now until oral intake improves - continue SSI     HTN - cont home medical regimen   DVT prophylaxis: on Coumadin  Code Status: Full  Family Communication: Patient at bedside    Disposition Plan: may need SNF, PT recommended SNF, CSW consult  Consultants:   None  Procedures:   None  Antimicrobials:   Rocephin 9/3 -->   Subjective: No vomiting, diarrhea better, tolerating diet, some dyspnea with exertion  Objective: Vitals:   11/01/16 1400 11/01/16 1956 11/02/16 0447 11/02/16 0448  BP: (!) 140/56 (!) 143/58 (!) 145/50   Pulse: 64 64 66   Resp: 18 18 18    Temp: (!) 97.5 F (36.4 C) 97.6 F (36.4 C) 98 F (36.7 C)   TempSrc: Oral Oral Oral   SpO2: 96% 97% 95%   Weight:    82.6 kg (182 lb 1.6 oz)  Height:        Intake/Output Summary (Last 24 hours) at 11/02/16 1319 Last data filed at 11/02/16 0825  Gross per 24 hour  Intake             1910 ml  Output              850 ml  Net             1060 ml   Filed Weights   10/31/16 0403 11/01/16 0500 11/02/16 0448  Weight: 81.9 kg (180 lb 8.9 oz) 85 kg (187 lb 6.3 oz) 82.6 kg (182 lb 1.6 oz)    Examination: Gen: Awake, Alert, Oriented X 3,  HEENT: PERRLA, Neck supple, no JVD Lungs: fine crackles bilaterally CVS: RRR,No  Gallops,Rubs or new Murmurs Abd: soft, Non tender, non distended, BS present Extremities: No Cyanosis, Clubbing or edema Skin: no new rashes   Data Reviewed: I have personally reviewed following labs and imaging studies  CBC:  Recent Labs Lab 10/30/16 1232 10/31/16 0429 11/01/16 0531 11/02/16 0437  WBC 8.4 6.8 6.1 6.7  NEUTROABS 3.9  --   --   --   HGB 11.7* 10.3* 9.5* 9.6*  HCT 36.1 32.6* 29.7* 29.8*  MCV 82.2 81.5 83.0 83.2  PLT 216 174 156 003   Basic Metabolic Panel:  Recent Labs Lab 10/30/16 1232 10/31/16 0429 11/01/16 0531 11/02/16 0437  NA 136 137 136 142  K 4.9 5.0 4.4 4.9  CL 103 108 112* 116*  CO2 20* 20* 18* 18*  GLUCOSE 161* 97 187* 211*  BUN 52* 50* 40* 34*  CREATININE 3.88* 3.32* 2.58* 2.20*  CALCIUM 9.1 8.4* 7.4* 7.5*    Liver Function Tests:  Recent Labs Lab 10/30/16 1232  AST 27  ALT 21  ALKPHOS 87  BILITOT 0.8  PROT 7.3   ALBUMIN 3.5    Recent Labs Lab 10/30/16 1232  LIPASE 27   Coagulation Profile:  Recent Labs Lab 10/30/16 2019 10/31/16 0429 11/01/16 0531 11/02/16 0437  INR 4.82* 4.93* 6.06* 6.27*   CBG:  Recent Labs Lab 11/01/16 1153 11/01/16 1652 11/01/16 2124 11/02/16 0722 11/02/16 1206  GLUCAP 191* 209* 191* 181* 199*   Urine analysis:    Component Value Date/Time   COLORURINE AMBER (A) 10/30/2016 1444   APPEARANCEUR CLOUDY (A) 10/30/2016 1444   LABSPEC 1.014 10/30/2016 1444   PHURINE 5.0 10/30/2016 1444   GLUCOSEU NEGATIVE 10/30/2016 1444   HGBUR MODERATE (A) 10/30/2016 1444   BILIRUBINUR NEGATIVE 10/30/2016 1444   BILIRUBINUR small 09/20/2011 1037   KETONESUR NEGATIVE 10/30/2016 1444   PROTEINUR NEGATIVE 10/30/2016 1444   UROBILINOGEN 0.2 11/18/2014 1941   NITRITE POSITIVE (A) 10/30/2016 1444   LEUKOCYTESUR MODERATE (A) 10/30/2016 1444   Radiology Studies: No results found.  Scheduled Meds: . cephALEXin  250 mg Oral Q8H  . diltiazem  180 mg Oral Daily  . ferrous gluconate  324 mg Oral QPM  . insulin aspart  0-9 Units Subcutaneous TID WC  . levothyroxine  25 mcg Oral QAC breakfast  . metoprolol tartrate  25 mg Oral BID  . potassium chloride SA  20 mEq Oral Daily  . protein supplement shake  11 oz Oral Q24H  . rosuvastatin  10 mg Oral Daily  . Warfarin - Pharmacist Dosing Inpatient   Does not apply q1800   Continuous Infusions:    LOS: 3 days    Time spent: 25 minutes    Domenic Polite, MD [facility] Pager [phone]  If 7PM-7AM, please contact night-coverage [email or web address] Password [email or web address] 11/02/2016, 1:19 PM

## 2016-11-02 NOTE — Progress Notes (Addendum)
CRITICAL VALUE ALERT  Critical Value:  INR 6.27  Date & Time Notied:  11/02/16 0539  Provider Notified: Opyd  Orders Received/Actions taken: Continue to hold coumadin

## 2016-11-02 NOTE — Evaluation (Signed)
Physical Therapy Evaluation Patient Details Name: Tiffany [name] MRN: [id number] DOB: 06/16/1931 Today's Date: 11/02/2016   History of Present Illness  83 y.o. female with medical history significant of HTN, HLD, DM, a-fib on Coumadin, presented to [facility] ED with main concern of two weeks duration of diarrhea,. Epigastric discomfort, associated with nausea and non bloody vomiting, poor oral intake since 2days. VSS, preliminary urine analysis is worrisome for UTI.  Clinical Impression  The patient presents with deconditioning, noted DOE with minimal activity of transfers. The patient will consider SNF  But not committed. No family present for discussion re: caregiver availability.Pt admitted with above diagnosis. Pt currently with functional limitations due to the deficits listed below (see PT Problem List).  Pt will benefit from skilled PT to increase their independence and safety with mobility to allow discharge to the venue listed below.       Follow Up Recommendations SNF;Supervision/Assistance - 24 hour or HHPT if family is availble   Equipment Recommendations  None recommended by PT    Recommendations for Other Services       Precautions / Restrictions Precautions Precautions: Fall      Mobility  Bed Mobility               General bed mobility comments: on BSC  Transfers Overall transfer level: Needs assistance   Transfers: Sit to/from Stand;Stand Pivot Transfers Sit to Stand: Mod assist Stand pivot transfers: Mod assist       General transfer comment: steady assist to riase and stand for pericare, relies on UE support. pivot from [facility] to recliner, rested then back to [facility] then recliner. Noted DOE, wheezing. oxygen saturation 96% on RA  Ambulation/Gait                Stairs            Wheelchair Mobility    Modified Rankin (Stroke Patients Only)       Balance Overall balance assessment: Needs assistance Sitting-balance support: No upper  extremity supported;Feet supported Sitting balance-Leahy Scale: Good     Standing balance support: During functional activity;Bilateral upper extremity supported Standing balance-Leahy Scale: Poor Standing balance comment: relies on arms                             Pertinent Vitals/Pain Pain Assessment: No/denies pain    Home Living Family/patient expects to be discharged to:: Private residence Living Arrangements: Alone   Type of Home: Apartment Home Access: Level entry     Home Layout: One level Home Equipment: Walker - 2 wheels;Cane - single point Additional Comments: daughters may not be available to assist more than PTA    Prior Function Level of Independence: Independent with assistive device(s)         Comments: does not drive now, daughter assists with  groceries     Hand Dominance        Extremity/Trunk Assessment   Upper Extremity Assessment Upper Extremity Assessment: Defer to OT evaluation    Lower Extremity Assessment Lower Extremity Assessment: Generalized weakness    Cervical / Trunk Assessment Cervical / Trunk Assessment: Normal  Communication   Communication: No difficulties  Cognition Arousal/Alertness: Awake/alert Behavior During Therapy: WFL for tasks assessed/performed Overall Cognitive Status: Within Functional Limits for tasks assessed  General Comments      Exercises     Assessment/Plan    PT Assessment Patient needs continued PT services  PT Problem List Decreased strength;Decreased activity tolerance;Decreased balance;Decreased mobility;Decreased knowledge of precautions;Decreased safety awareness;Decreased knowledge of use of DME       PT Treatment Interventions DME instruction;Gait training;Functional mobility training;Therapeutic activities;Therapeutic exercise;Patient/family education    PT Goals (Current goals can be found in the Care Plan section)   Acute Rehab PT Goals Patient Stated Goal: to feel stronger to go home PT Goal Formulation: With patient Time For Goal Achievement: 11/16/16 Potential to Achieve Goals: Good    Frequency Min 3X/week   Barriers to discharge Decreased caregiver support      Co-evaluation               AM-PAC PT "6 Clicks" Daily Activity  Outcome Measure Difficulty turning over in bed (including adjusting bedclothes, sheets and blankets)?: Unable Difficulty moving from lying on back to sitting on the side of the bed? : Unable Difficulty sitting down on and standing up from a chair with arms (e.g., wheelchair, bedside commode, etc,.)?: Unable Help needed moving to and from a bed to chair (including a wheelchair)?: A Lot Help needed walking in hospital room?: A Lot Help needed climbing 3-5 steps with a railing? : Total 6 Click Score: 8    End of Session   Activity Tolerance: Patient limited by fatigue Patient left: in chair;with call bell/phone within reach;with chair alarm set Nurse Communication: Mobility status PT Visit Diagnosis: Unsteadiness on feet (R26.81);Difficulty in walking, not elsewhere classified (R26.2)    Time: 1696-7893 PT Time Calculation (min) (ACUTE ONLY): 30 min   Charges:   PT Evaluation $PT Eval Low Complexity: 1 Low PT Treatments $Therapeutic Activity: 8-22 mins   PT G CodesTresa Endo PT [phone]   [place], [place] 11/02/2016, 10:38 AM

## 2016-11-03 DIAGNOSIS — R531 Weakness: Secondary | ICD-10-CM | POA: Diagnosis not present

## 2016-11-03 DIAGNOSIS — R2681 Unsteadiness on feet: Secondary | ICD-10-CM | POA: Diagnosis not present

## 2016-11-03 DIAGNOSIS — M6281 Muscle weakness (generalized): Secondary | ICD-10-CM | POA: Diagnosis not present

## 2016-11-03 DIAGNOSIS — N178 Other acute kidney failure: Secondary | ICD-10-CM | POA: Diagnosis not present

## 2016-11-03 DIAGNOSIS — K5649 Other impaction of intestine: Secondary | ICD-10-CM | POA: Diagnosis not present

## 2016-11-03 DIAGNOSIS — I480 Paroxysmal atrial fibrillation: Secondary | ICD-10-CM | POA: Diagnosis not present

## 2016-11-03 DIAGNOSIS — R278 Other lack of coordination: Secondary | ICD-10-CM | POA: Diagnosis not present

## 2016-11-03 DIAGNOSIS — R197 Diarrhea, unspecified: Secondary | ICD-10-CM | POA: Diagnosis not present

## 2016-11-03 DIAGNOSIS — N3000 Acute cystitis without hematuria: Secondary | ICD-10-CM | POA: Diagnosis not present

## 2016-11-03 DIAGNOSIS — R05 Cough: Secondary | ICD-10-CM | POA: Diagnosis not present

## 2016-11-03 DIAGNOSIS — R41841 Cognitive communication deficit: Secondary | ICD-10-CM | POA: Diagnosis not present

## 2016-11-03 DIAGNOSIS — D6489 Other specified anemias: Secondary | ICD-10-CM | POA: Diagnosis not present

## 2016-11-03 DIAGNOSIS — K529 Noninfective gastroenteritis and colitis, unspecified: Secondary | ICD-10-CM

## 2016-11-03 DIAGNOSIS — N390 Urinary tract infection, site not specified: Secondary | ICD-10-CM | POA: Diagnosis not present

## 2016-11-03 DIAGNOSIS — R21 Rash and other nonspecific skin eruption: Secondary | ICD-10-CM | POA: Diagnosis not present

## 2016-11-03 DIAGNOSIS — N179 Acute kidney failure, unspecified: Secondary | ICD-10-CM | POA: Diagnosis not present

## 2016-11-03 LAB — BASIC METABOLIC PANEL
Anion gap: 7 (ref 5–15)
BUN: 30 mg/dL — ABNORMAL HIGH (ref 6–20)
CO2: 17 mmol/L — ABNORMAL LOW (ref 22–32)
Calcium: 7.7 mg/dL — ABNORMAL LOW (ref 8.9–10.3)
Chloride: 115 mmol/L — ABNORMAL HIGH (ref 101–111)
Creatinine, Ser: 1.94 mg/dL — ABNORMAL HIGH (ref 0.44–1.00)
GFR calc Af Amer: 27 mL/min — ABNORMAL LOW (ref >60)
GFR calc non Af Amer: 23 mL/min — ABNORMAL LOW (ref >60)
Glucose, Bld: 184 mg/dL — ABNORMAL HIGH (ref 65–99)
Potassium: 4.5 mmol/L (ref 3.5–5.1)
Sodium: 139 mmol/L (ref 135–145)

## 2016-11-03 LAB — GLUCOSE, CAPILLARY
Glucose-Capillary: 168 mg/dL — ABNORMAL HIGH (ref 65–99)
Glucose-Capillary: 316 mg/dL — ABNORMAL HIGH (ref 65–99)

## 2016-11-03 LAB — CBC
HCT: 28 % — ABNORMAL LOW (ref 36.0–46.0)
Hemoglobin: 9.1 g/dL — ABNORMAL LOW (ref 12.0–15.0)
MCH: 26.5 pg (ref 26.0–34.0)
MCHC: 32.5 g/dL (ref 30.0–36.0)
MCV: 81.6 fL (ref 78.0–100.0)
Platelets: 171 10*3/uL (ref 150–400)
RBC: 3.43 MIL/uL — ABNORMAL LOW (ref 3.87–5.11)
RDW: 18.7 % — ABNORMAL HIGH (ref 11.5–15.5)
WBC: 7.7 10*3/uL (ref 4.0–10.5)

## 2016-11-03 LAB — PROTIME-INR
INR: 4.03
Prothrombin Time: 38.9 seconds — ABNORMAL HIGH (ref 11.4–15.2)

## 2016-11-03 MED ORDER — FUROSEMIDE 10 MG/ML IJ SOLN
20.0000 mg | Freq: Once | INTRAMUSCULAR | Status: AC
  Administered 2016-11-03: 20 mg via INTRAVENOUS
  Filled 2016-11-03: qty 2

## 2016-11-03 MED ORDER — WARFARIN SODIUM 2.5 MG PO TABS: 2.5000 mg | ORAL_TABLET | ORAL | Status: DC

## 2016-11-03 MED ORDER — CEPHALEXIN 250 MG PO CAPS: 250.0000 mg | ORAL_CAPSULE | Freq: Three times a day (TID) | ORAL | 0 refills | Status: DC

## 2016-11-03 MED ORDER — PATIENT'S GUIDE TO USING COUMADIN BOOK
Freq: Once | Status: DC
  Filled 2016-11-03: qty 1

## 2016-11-03 NOTE — Discharge Summary (Signed)
Physician Discharge Summary  Tiffany [name] Heal OAC:[id number] DOB: [birth year] DOA: 10/30/2016  PCP: Tiffany Hippo, MD  Admit date: 10/30/2016 Discharge date: 11/03/2016  Time spent: 35 minutes  Recommendations for Outpatient Follow-up:  1. PCP in 1 week, please check Bmet at FU 2. Recheck INR on Monday 9/11 and if INR <3 then resume Coumadin 2.5mg  every other day for goal INR 2-3range   Discharge Diagnoses:  Principal Problem:   Acute renal failure (ARF) ([place])   Klebsiella UTI   Abx associated diarhea   DM type 2 (diabetes mellitus, type 2) ([facility])   Essential hypertension   Atrial fibrillation ([facility])   H/o C V A/STROKE   Warfarin-induced coagulopathy ([facility])   Hypothyroidism   UTI (urinary tract infection)   Acute renal failure syndrome ([place])   Dehydration   Discharge Condition: stable  Diet recommendation: DM heart healthy  Filed Weights   11/01/16 0500 11/02/16 0448 11/03/16 0603  Weight: 85 kg (187 lb 6.3 oz) 82.6 kg (182 lb 1.6 oz) 86 kg (189 lb 9.5 oz)    History of present illness:  83 y.o.femalewith medical history significant of HTN, HLD, DM, a-fib on Coumadin, presented to [facility] ED with main concern of two weeks duration of diarrhea,. Epigastric discomfort, associated with nausea and non bloody vomiting, poor oral intake since 2days. VSS, preliminary urine analysis is worrisome for UTI. Blood work notable for Cr 3.8 (was 1.7 three days ago).   Hospital Course:   Acute renal failure (ARF) ([facility]) on CKD3 - suspect pre renal etiology from GI losses and possibly some ATN too - baseline creatinine around 1.4, it was 3.8 on admission - metformin and lasix have been held since admission  - improved with hydration and down to 1.9 at discharge today, resume PO lasix tomorrow -recheck Bmet in 1 week    Diarrhea - off and on for 2 weeks following recent Abx use -suspect Abx associated, no further diarrhea in the hospital and hence couldn't sent specimen for Cdiff or GI  pathogen panel - improved, afebrile and WBC normal - CT with ? PSBO but clinically do not suspect this -no vomiting, tolerating PO and having BMs - improved with solid stools and tolerating a regular diet now  Suspected UTI -Urine Cx grew100K Klebsiella and 50K Enterococcus -suspect 50K Enterococcus is likely colonization -initially treated with ceftriaxone then changed to keflex, continue for 58more days to complete 7day course  A-fib, CHADS score 4 - has been on Coumadin but INR is supra therapeutic around 6 due to recent Abx and diarrhea - Coumadin held, INR now 4 and advised holding coumadin for 57more days and then recehck INR on Monday 9/11 and resume Coumadin when INR <3 -recommended dose would be 2.5mg  every other day when restarted for goal INR of 2-3 - continue Metoprolol and Cardizem     Hypothyroidism - continue synthroid   HLD - continue home regimen with statin  DM type II - held Metformin due to AKI - resumed GLipizide at discharge  HTN - cont home medical regimen   Discharge Exam: Vitals:   11/03/16 0603 11/03/16 0716  BP: (!) 137/59   Pulse: 63   Resp: 20   Temp: 98.1 F (36.7 C)   SpO2: 95% 92%    General: AAOx3 Cardiovascular: S1S2/RRR Respiratory: CTAB  Discharge Instructions   Discharge Instructions    Diet - low sodium heart healthy    Complete by:  As directed    Diet Carb Modified  Complete by:  As directed    Increase activity slowly    Complete by:  As directed      Current Discharge Medication List    START taking these medications   Details  cephALEXin (KEFLEX) 250 MG capsule Take 1 capsule (250 mg total) by mouth every 8 (eight) hours. For 3days Qty: 9 capsule, Refills: 0      CONTINUE these medications which have CHANGED   Details  warfarin (COUMADIN) 2.5 MG tablet Take 1 tablet (2.5 mg total) by mouth every other day. Recheck INR on Monday 9/11 and if INR <3 then resume Coumadin 2.5mg  every other day for  goal INR 2-3range      CONTINUE these medications which have NOT CHANGED   Details  diltiazem (DILACOR XR) 180 MG 24 hr capsule Take 180 mg by mouth daily.    FERGON 240 (27 FE) MG tablet Take 1 tablet by mouth every evening.  Refills: 3    furosemide (LASIX) 20 MG tablet Take 20 mg by mouth daily.    glipiZIDE (GLUCOTROL) 10 MG tablet Take 5 mg by mouth daily.     levothyroxine (SYNTHROID, LEVOTHROID) 25 MCG tablet Take 25 mcg by mouth daily before breakfast.    metoprolol tartrate (LOPRESSOR) 25 MG tablet Take 50 mg by mouth 2 (two) times daily.     ondansetron (ZOFRAN ODT) 4 MG disintegrating tablet Take 1 tablet (4 mg total) by mouth every 8 (eight) hours as needed for nausea. Qty: 10 tablet, Refills: 0    potassium chloride SA (K-DUR,KLOR-CON) 20 MEQ tablet Take 20 mEq by mouth daily.    ranitidine (ZANTAC) 150 MG capsule Take 150 mg by mouth every morning.     rosuvastatin (CRESTOR) 10 MG tablet Take 10 mg by mouth daily.    XOPENEX HFA 45 MCG/ACT inhaler Inhale 2 puffs into the lungs daily as needed for shortness of breath or wheezing.      STOP taking these medications     metFORMIN (GLUCOPHAGE) 1000 MG tablet        No Known Allergies Follow-up Information    Tiffany Hippo, MD. Schedule an appointment as soon as possible for a visit in 1 week(s).   Specialty:  Family Medicine Contact information: [place] [address] [phone]            The results of significant diagnostics from this hospitalization (including imaging, microbiology, ancillary and laboratory) are listed below for reference.    Significant Diagnostic Studies: Ct Abdomen Pelvis Wo Contrast  Result Date: 10/30/2016 CLINICAL DATA:  83 year old female with abdominal pain, nausea and vomiting for 2 weeks. EXAM: CT ABDOMEN AND PELVIS WITHOUT CONTRAST TECHNIQUE: Multidetector CT imaging of the abdomen and pelvis was performed following the standard protocol  without IV contrast. COMPARISON:  11/18/2014 and prior CTs FINDINGS: Please note that parenchymal abnormalities may be missed without intravenous contrast. Lower chest: No acute abnormality. Bilateral lower lobe cylindrical bronchiectasis noted. Cardiomegaly and pacemaker lead identified. Hepatobiliary: The liver is unremarkable. The patient is status post cholecystectomy. No biliary dilatation. Pancreas: Atrophic without other abnormality Spleen: Unremarkable Adrenals/Urinary Tract: The kidneys, adrenal glands and bladder are unremarkable except for right renal cysts. Stomach/Bowel: Mildly distended proximal mid small bowel loops are noted with decreased caliber of distal small bowel loops. No abrupt transition point is identified. The stomach is distended. No bowel wall thickening or inflammatory changes noted. Sigmoid colonic diverticulosis noted without evidence of diverticulitis. No evidence of pneumoperitoneum or  abscess. Vascular/Lymphatic: Aortic atherosclerosis. No enlarged abdominal or pelvic lymph nodes. Reproductive: Status post hysterectomy. No adnexal masses. Other: No ascites. A small umbilical hernia containing fat and pelvic floor laxity are again identified. Musculoskeletal: No acute abnormality. No suspicious focal bony lesions. Compression of the L2 inferior endplate is unchanged. IMPRESSION: 1. Dilated proximal and mid small bowel loops with nondistended distal small bowel loops, but no evidence of abrupt transition point. Favor partial small bowel obstruction over ileus. No evidence of pneumoperitoneum, abscess or free fluid. 2. Bilateral lower lobe cylindrical bronchiectasis. 3. Cardiomegaly 4.  Aortic Atherosclerosis (ICD10-I70.0). Electronically Signed   By: Margarette Canada M.D.   On: 10/30/2016 16:17   Dg Abd Acute W/chest  Result Date: 10/25/2016 CLINICAL DATA:  Six days of diarrhea with burning sensation. Onset of vomiting last night. History of cardiomyopathy, diabetes, previous CVA.  EXAM: DG ABDOMEN ACUTE W/ 1V CHEST COMPARISON:  KUB of September 29, 2016 FINDINGS: The lungs are adequately inflated. There is no focal infiltrate. The cardiac silhouette is top-normal in size. The pulmonary vascularity is not engorged. The ICD is in reasonable position radiographically. There is calcification in the wall of the aortic arch. There is no pleural effusion. Within the abdomen no free air is observed. There is minimal gaseous distention of the small bowel loop in the left mid to lower abdomen. The colonic stool and gas pattern is normal. There is no fecal impaction. There are degenerative changes of the lower lumbar spine. No abnormal soft tissue calcifications are observed. There surgical clips in the gallbladder fossa. IMPRESSION: There is no acute cardiopulmonary abnormality. There is thoracic aortic atherosclerosis. No evidence of bowel obstruction or ileus. Given the persistent symptoms, abdominal and pelvic CT scanning may be a useful next imaging step. Electronically Signed   By: David  Martinique M.D.   On: 10/25/2016 12:00    Microbiology: Recent Results (from the past 240 hour(s))  Urine Culture     Status: Abnormal   Collection Time: 10/30/16  2:44 PM  Result Value Ref Range Status   Specimen Description URINE, RANDOM  Final   Special Requests NONE  Final   Culture (A)  Final    >=100,000 COLONIES/mL KLEBSIELLA PNEUMONIAE 50,000 COLONIES/mL ENTEROCOCCUS FAECIUM    Report Status 11/02/2016 FINAL  Final   Organism ID, Bacteria KLEBSIELLA PNEUMONIAE (A)  Final   Organism ID, Bacteria ENTEROCOCCUS FAECIUM (A)  Final      Susceptibility   Enterococcus faecium - MIC*    AMPICILLIN <=2 SENSITIVE Sensitive     LEVOFLOXACIN 0.5 SENSITIVE Sensitive     NITROFURANTOIN 32 SENSITIVE Sensitive     VANCOMYCIN <=0.5 SENSITIVE Sensitive     * 50,000 COLONIES/mL ENTEROCOCCUS FAECIUM   Klebsiella pneumoniae - MIC*    AMPICILLIN RESISTANT Resistant     CEFAZOLIN <=4 SENSITIVE Sensitive      CEFTRIAXONE <=1 SENSITIVE Sensitive     CIPROFLOXACIN <=0.25 SENSITIVE Sensitive     GENTAMICIN <=1 SENSITIVE Sensitive     IMIPENEM <=0.25 SENSITIVE Sensitive     NITROFURANTOIN 64 INTERMEDIATE Intermediate     TRIMETH/SULFA <=20 SENSITIVE Sensitive     AMPICILLIN/SULBACTAM <=2 SENSITIVE Sensitive     PIP/TAZO <=4 SENSITIVE Sensitive     Extended ESBL NEGATIVE Sensitive     * >=100,000 COLONIES/mL KLEBSIELLA PNEUMONIAE     Labs: Basic Metabolic Panel:  Recent Labs Lab 10/30/16 1232 10/31/16 0429 11/01/16 0531 11/02/16 0437 11/03/16 0448  NA 136 137 136 142 139  K 4.9 5.0 4.4  4.9 4.5  CL 103 108 112* 116* 115*  CO2 20* 20* 18* 18* 17*  GLUCOSE 161* 97 187* 211* 184*  BUN 52* 50* 40* 34* 30*  CREATININE 3.88* 3.32* 2.58* 2.20* 1.94*  CALCIUM 9.1 8.4* 7.4* 7.5* 7.7*   Liver Function Tests:  Recent Labs Lab 10/30/16 1232  AST 27  ALT 21  ALKPHOS 87  BILITOT 0.8  PROT 7.3  ALBUMIN 3.5    Recent Labs Lab 10/30/16 1232  LIPASE 27   No results for input(s): AMMONIA in the last 168 hours. CBC:  Recent Labs Lab 10/30/16 1232 10/31/16 0429 11/01/16 0531 11/02/16 0437 11/03/16 0448  WBC 8.4 6.8 6.1 6.7 7.7  NEUTROABS 3.9  --   --   --   --   HGB 11.7* 10.3* 9.5* 9.6* 9.1*  HCT 36.1 32.6* 29.7* 29.8* 28.0*  MCV 82.2 81.5 83.0 83.2 81.6  PLT 216 174 156 180 171   Cardiac Enzymes: No results for input(s): CKTOTAL, CKMB, CKMBINDEX, TROPONINI in the last 168 hours. BNP: BNP (last 3 results) No results for input(s): BNP in the last 8760 hours.  ProBNP (last 3 results) No results for input(s): PROBNP in the last 8760 hours.  CBG:  Recent Labs Lab 11/02/16 0722 11/02/16 1206 11/02/16 1717 11/02/16 2120 11/03/16 1221  GLUCAP 181* 199* 214* 210* 316*       SignedDomenic Polite MD.  [facility] 11/03/2016, 12:46 PM

## 2016-11-03 NOTE — Progress Notes (Signed)
ANTICOAGULATION CONSULT NOTE - Follow-Up Consult  Pharmacy Consult for Warfarin Indication: atrial fibrillation, stroke  No Known Allergies  Patient Measurements: Height: 5\' 2"  (157.5 cm) Weight: 189 lb 9.5 oz (86 kg) IBW/kg (Calculated) : 50.1  Vital Signs: Temp: 98.1 F (36.7 C) (09/07 0603) Temp Source: Oral (09/07 0603) BP: 137/59 (09/07 0603) Pulse Rate: 63 (09/07 0603)  Labs:  Recent Labs  11/01/16 0531 11/02/16 0437 11/03/16 0448  HGB 9.5* 9.6* 9.1*  HCT 29.7* 29.8* 28.0*  PLT 156 180 171  LABPROT 53.5* 55.0* 38.9*  INR 6.06* 6.27* 4.03*  CREATININE 2.58* 2.20* 1.94*    Estimated Creatinine Clearance: 22.8 mL/min (A) (by C-G formula based on SCr of 1.94 mg/dL (H)).   Medical History: Past Medical History:  Diagnosis Date  . Diverticulosis   . DM2 (diabetes mellitus, type 2) ([place])   . Esophageal reflux   . HTN (hypertension)   . Hyperlipidemia   . Hypothyroidism   . Microalbuminuria    last done in 1/07   . Paroxysmal atrial fibrillation (HCC)    and atrial tachycardia  . Stroke ([place]) 1999  . Tachycardia-bradycardia syndrome ([place])    s/p PPM  . Unspecified hypothyroidism 11/20/2013    Assessment: 7 yoF admitted on 9/3 with weakness, abdominal pain, N/V, poor oral intake and fatigue. PMH includes a-fib and stroke on chronic warfarin anticoagulation.  Pharmacy is consulted to resume warfarin dosing inpatient. Admission INR supratherapeutic at 4.82.  Home warfarin dose: 2.5 mg every other day.  Last dose on 9/2.  Today, 11/03/2016  INR supratherapeutic but now decreasing (4.03) - possibly from vitamin K losses with diarrhea (diarrhea now resolved)  Hgb low but stable at 9.1, Pltc WNL  No bleeding reported  SCr 1.94, acutely elevated but improving (1.72 on 10/25/16)  Regular diet + Premier Protein ordered  Drug-drug interactions: antibiotics may increase INR   Goal of Therapy:  INR 2-3 Monitor platelets by anticoagulation protocol: Yes    Plan:  Continue to hold warfarin today. Daily PT/INR. Monitor CBC and for signs/symptoms of bleeding.   Peggyann Juba, PharmD, BCPS Pager: [phone] 11/02/2016 10:50 AM

## 2016-11-03 NOTE — Progress Notes (Signed)
CSW consulted to assist with SNF placement. Pt is ready to dc today. CSW met with pt / spoke with daughter, Kalman Shan, with pt's permission to assist with dc planning. PT has recommended SNF at dc. Pt / daughter are in agreement with this plan. SNF search initiated and bed offers provided. Pt / daughter accepted SNF bed at [facility] SNF is able to accept pt today. CSW will assist with dc planning to SNF.  Werner Lean LCSW [phone]

## 2016-11-03 NOTE — NC FL2 (Signed)
[place] LEVEL OF CARE SCREENING TOOL     IDENTIFICATION  Patient Name: Tiffany [name] Birthdate: 1932/02/17 Sex: female Admission Date (Current Location): 10/30/2016  [facility] and [place] Number:  Herbalist and Address:  [facility],  [place] [address] [place]      Provider Number: [id number]  Attending Physician Name and Address:  Domenic Polite, MD  Relative Name and Phone Number:       Current Level of Care: Hospital Recommended Level of Care: [place] Prior Approval Number:    Date Approved/Denied:   PASRR Number: [id number]  Discharge Plan: SNF    Current Diagnoses: Patient Active Problem List   Diagnosis Date Noted  . Acute renal failure (ARF) ([place]) 10/30/2016  . Acute renal failure syndrome ([place])   . Dehydration   . ARF (acute renal failure) ([place]) 11/19/2014  . Acute gastroenteritis 11/19/2014  . UTI (urinary tract infection) 11/22/2013  . Hypothyroidism 11/20/2013  . Acute blood loss anemia 11/20/2013  . Hematochezia 11/18/2013  . Warfarin-induced coagulopathy ([place]) 11/18/2013  . Pacemaker-Medtronic 01/04/2012  . Long term (current) use of anticoagulants 10/25/2011  . Tachycardia-bradycardia syndrome ([place]) 01/04/2011  . Atrial fibrillation ([place]) 01/05/2010  . Hyperlipidemia 07/28/2006  . Essential hypertension 07/28/2006  . CARDIOMYOPATHY 07/28/2006  . DM type 2 (diabetes mellitus, type 2) ([place]) 05/16/2006  . C V A/STROKE 05/16/2006  . HEMORRHOIDS, WITH BLEEDING 05/16/2006    Orientation RESPIRATION BLADDER Height & Weight     Self, Time, Situation, Place  Normal Continent Weight: 86 kg (189 lb 9.5 oz) Height:  5\' 2"  (157.5 cm)  BEHAVIORAL SYMPTOMS/MOOD NEUROLOGICAL BOWEL NUTRITION STATUS  Other (Comment) (no behaviors)   Continent Diet  AMBULATORY STATUS COMMUNICATION OF NEEDS Skin   Extensive Assist Verbally Normal                       Personal Care Assistance  Level of Assistance  Bathing, Feeding, Dressing Bathing Assistance: Limited assistance Feeding assistance: Independent Dressing Assistance: Limited assistance     Functional Limitations Info  Sight, Hearing, Speech Sight Info: Adequate Hearing Info: Adequate Speech Info: Adequate    SPECIAL CARE FACTORS FREQUENCY  PT (By licensed PT), OT (By licensed OT)     PT Frequency: 5x wk OT Frequency: 5x wk            Contractures Contractures Info: Not present    Additional Factors Info  Code Status Code Status Info: Full Code             Current Medications (11/03/2016):  This is the current hospital active medication list Current Facility-Administered Medications  Medication Dose Route Frequency Provider Last Rate Last Dose  . albuterol (PROVENTIL) (2.5 MG/3ML) 0.083% nebulizer solution 2.5 mg  2.5 mg Nebulization Daily PRN Thomes Lolling, RPH   2.5 mg at 11/02/16 2241  . cephALEXin (KEFLEX) capsule 250 mg  250 mg Oral Q8H Domenic Polite, MD   250 mg at 11/03/16 0558  . diltiazem (CARDIZEM CD) 24 hr capsule 180 mg  180 mg Oral Daily Theodis Blaze, MD   180 mg at 11/03/16 1010  . ferrous gluconate (FERGON) tablet 324 mg  324 mg Oral QPM Theodis Blaze, MD   324 mg at 11/02/16 1743  . HYDROcodone-acetaminophen (NORCO/VICODIN) 5-325 MG per tablet 1-2 tablet  1-2 tablet Oral Q4H PRN Theodis Blaze, MD      . insulin aspart (novoLOG) injection 0-9 Units  0-9 Units Subcutaneous TID WC Theodis Blaze, MD   3 Units at 11/03/16 [phone]  . ipratropium-albuterol (DUONEB) 0.5-2.5 (3) MG/3ML nebulizer solution 3 mL  3 mL Nebulization TID Domenic Polite, MD   3 mL at 11/03/16 0715  . levothyroxine (SYNTHROID, LEVOTHROID) tablet 25 mcg  25 mcg Oral QAC breakfast Theodis Blaze, MD   25 mcg at 11/03/16 1010  . metoprolol tartrate (LOPRESSOR) tablet 25 mg  25 mg Oral BID Theodis Blaze, MD   25 mg at 11/03/16 1011  . ondansetron (ZOFRAN) tablet 4 mg  4 mg Oral Q6H PRN Theodis Blaze, MD        Or  . ondansetron [facility]) injection 4 mg  4 mg Intravenous Q6H PRN Theodis Blaze, MD      . patient's guide to using coumadin book   Does not apply Once Emiliano Dyer, [facility]      . potassium chloride SA (K-DUR,KLOR-CON) CR tablet 20 mEq  20 mEq Oral Daily Theodis Blaze, MD   20 mEq at 11/03/16 1011  . protein supplement (PREMIER PROTEIN) liquid  11 oz Oral Q24H Theodis Blaze, MD   11 oz at 11/02/16 1600  . rosuvastatin (CRESTOR) tablet 10 mg  10 mg Oral Daily Theodis Blaze, MD   10 mg at 11/03/16 1010  . Warfarin - Pharmacist Dosing Inpatient   Does not apply q1800 Randa Spike, [place] at 11/02/16 1800     Discharge Medications: Please see discharge summary for a list of discharge medications.  Relevant Imaging Results:  Relevant Lab Results:   Additional Information SS # [id number]  Friend Dorfman, Randall An, LCSW

## 2016-11-03 NOTE — Clinical Social Work Placement (Signed)
   CLINICAL SOCIAL WORK PLACEMENT  NOTE  Date:  11/03/2016  Patient Details  Name: Tiffany [name] MRN: [id number] Date of Birth: June 11, 1931  Clinical Social Work is seeking post-discharge placement for this patient at the [place] level of care (*CSW will initial, date and re-position this form in  chart as items are completed):  Yes   Patient/family provided with [place] Work Department's list of facilities offering this level of care within the geographic area requested by the patient (or if unable, by the patient's family).  Yes   Patient/family informed of their freedom to choose among providers that offer the needed level of care, that participate in Medicare, Medicaid or managed care program needed by the patient, have an available bed and are willing to accept the patient.  Yes   Patient/family informed of [facility]'s ownership interest in [facility] and [facility], as well as of the fact that they are under no obligation to receive care at these facilities.  PASRR submitted to EDS on 11/03/16     PASRR number received on 11/10/16     Existing PASRR number confirmed on       FL2 transmitted to all facilities in geographic area requested by pt/family on 11/03/16     FL2 transmitted to all facilities within larger geographic area on       Patient informed that his/her managed care company has contracts with or will negotiate with certain facilities, including the following:        Yes   Patient/family informed of bed offers received.  Patient chooses bed at [place], [place]     Physician recommends and patient chooses bed at      Patient to be transferred to [place], [place] on 11/03/16.  Patient to be transferred to facility by [facility]AR     Patient family notified on 11/03/16 of transfer.  Name of family member notified:  Daughter     PHYSICIAN       Additional Comment: Pt / daughter are in  agreement with dc to [facility] ( [place] ) today. [facility] transport required. Medical necessity form completed. Scripts included in [employer]. # for report provided to nsg.   _______________________________________________ Luretha Rued, [place] 11/03/2016, 1:18 PM

## 2016-11-03 NOTE — Clinical Social Work Placement (Signed)
   CLINICAL SOCIAL WORK PLACEMENT  NOTE  Date:  11/03/2016  Patient Details  Name: Tiffany [name] MRN: [id number] Date of Birth: [birth year]  Clinical Social Work is seeking post-discharge placement for this patient at the [place] level of care (*CSW will initial, date and re-position this form in  chart as items are completed):  Yes   Patient/family provided with [place] Work Department's list of facilities offering this level of care within the geographic area requested by the patient (or if unable, by the patient's family).  Yes   Patient/family informed of their freedom to choose among providers that offer the needed level of care, that participate in Medicare, Medicaid or managed care program needed by the patient, have an available bed and are willing to accept the patient.  Yes   Patient/family informed of [facility]'s ownership interest in [facility] and [facility], as well as of the fact that they are under no obligation to receive care at these facilities.  PASRR submitted to EDS on 11/03/16     PASRR number received on 11/10/16     Existing PASRR number confirmed on       FL2 transmitted to all facilities in geographic area requested by pt/family on 11/03/16     FL2 transmitted to all facilities within larger geographic area on       Patient informed that his/her managed care company has contracts with or will negotiate with certain facilities, including the following:        Yes   Patient/family informed of bed offers received.  Patient chooses bed at [place], [place]     Physician recommends and patient chooses bed at      Patient to be transferred to [place], [place] on 11/03/16.  Patient to be transferred to facility by [facility]     Patient family notified on 11/03/16 of transfer.  Name of family member notified:  Daughter     PHYSICIAN       Additional Comment: Pt / family are inagreement  with dc to [facility] [facility] today. [facility] transport is required. Medical necessity form completed. No scripts printed. DC Summary sent to SNF for review. # for report provided to nsg.   _______________________________________________ Luretha Rued, [place] 11/03/2016, 2:18 PM

## 2016-11-03 NOTE — Progress Notes (Signed)
Report given to Margarito Courser, RN at [facility]. Pt will be transported via [facility].

## 2016-11-03 NOTE — Progress Notes (Signed)
Nutrition Follow-up  DOCUMENTATION CODES:   Obesity unspecified  INTERVENTION:  - Continue Premier Protein once/day. - Continue to encourage PO intakes of meals and supplement.   NUTRITION DIAGNOSIS:   Inadequate oral intake related to nausea, vomiting as evidenced by per patient/family report -improving   GOAL:   Patient will meet greater than or equal to 90% of their needs -beginning to meet with meals and supplement   MONITOR:   PO intake, Supplement acceptance, Labs, Weight trends  ASSESSMENT:   Pt with PMH of HTN. HLD, DM, diverticulosis, and stroke. Presents this admission with ARF and abdominal pain. CT questionable partial small bowel obstruction but pt passing gas.    9/7 Per chart review, pt has gained 9 lbs/4.1 kg from previous assessment and is +10 lbs/4.8 kg compared to admission weight. Per doc flow sheet, pt consumed 80 of lunch and 90% of dinner 9/4; 100% of breakfast and dinner and 90% of lunch on 9/5; 50% of breakfast and 90% of dinner 9/6. She has been accepting all doses of Premier Protein. Per Dr. Delene Loll note yesterday afternoon, pt may need SNF at time of d/c.  Medications reviewed; CL: 115 mmol/L, BUN: 30 mg/dL, creatinine: 1.94 mg/dL, Ca: 7.7 mg/dL, GFR: 23 mL/min.  Labs reviewed; 20 mg IV Lasix x1 dose yesterday and x1 dose today, sliding scale Novolog, 25 mcg oral Synthroid/day, 20 mEq oral KCl/day.    9/4 - Pt had loss in appetite two weeks prior to admission related to nausea/vomiting.  - She typically consumes three meals per day but dropped to two meals in the past two weeks.  - Pt does not use supplementation at home.  - Pt currently on regular diet consuming 90% of her last meal.  - Records indicate pt has lost 4% of body wt in 5 months. This is not significant.   Nutrition-Focused physical exam completed. Findings are no fat depletion, no muscle depletion, and no edema.    Diet Order:  Diet regular Room service appropriate? Yes; Fluid  consistency: Thin  Skin:  Reviewed, no issues  Last BM:  9/6  Height:   Ht Readings from Last 1 Encounters:  10/30/16 5\' 2"  (1.575 m)    Weight:   Wt Readings from Last 1 Encounters:  11/03/16 189 lb 9.5 oz (86 kg)    Ideal Body Weight:  50 kg  BMI:  Body mass index is 34.68 kg/m.  Estimated Nutritional Needs:   Kcal:  1238 (MSJ)  Protein:  65-75 grams (1.3-1.5 g/kg IBW)  Fluid:  >1.2 L/day  EDUCATION NEEDS:   No education needs identified at this time     Jarome Matin, MS, RD, LDN, CNSC Inpatient Clinical Dietitian Pager # [phone] After hours/weekend pager # [phone]

## 2016-11-03 NOTE — Care Management Note (Signed)
Case Management Note  Patient Details  Name: Tiffany [name] MRN: [id number] Date of Birth: 02/04/1932  Subjective/Objective:    PT recc SNF. CSW following for SNF.                Action/Plan:d/c SNF.   Expected Discharge Date:  11/03/16               Expected Discharge Plan:  Skilled Nursing Facility  In-House Referral:  Clinical Social Work  Discharge planning Services  CM Consult  Post Acute Care Choice:    Choice offered to:     DME Arranged:    DME Agency:     HH Arranged:    [place] Agency:     Status of Service:  Completed, signed off  If discussed at [employer], dates discussed:    Additional Comments:  Dessa Phi, RN 11/03/2016, 12:48 PM

## 2016-11-05 DIAGNOSIS — R21 Rash and other nonspecific skin eruption: Secondary | ICD-10-CM | POA: Diagnosis not present

## 2016-11-05 DIAGNOSIS — N390 Urinary tract infection, site not specified: Secondary | ICD-10-CM | POA: Diagnosis not present

## 2016-11-05 DIAGNOSIS — R05 Cough: Secondary | ICD-10-CM | POA: Diagnosis not present

## 2016-11-05 DIAGNOSIS — N179 Acute kidney failure, unspecified: Secondary | ICD-10-CM | POA: Diagnosis not present

## 2016-11-05 DIAGNOSIS — I480 Paroxysmal atrial fibrillation: Secondary | ICD-10-CM | POA: Diagnosis not present

## 2016-11-05 DIAGNOSIS — R531 Weakness: Secondary | ICD-10-CM | POA: Diagnosis not present

## 2016-11-05 DIAGNOSIS — D6489 Other specified anemias: Secondary | ICD-10-CM | POA: Diagnosis not present

## 2016-11-19 DIAGNOSIS — I428 Other cardiomyopathies: Secondary | ICD-10-CM | POA: Diagnosis not present

## 2016-11-19 DIAGNOSIS — D649 Anemia, unspecified: Secondary | ICD-10-CM | POA: Diagnosis not present

## 2016-11-19 DIAGNOSIS — E785 Hyperlipidemia, unspecified: Secondary | ICD-10-CM | POA: Diagnosis not present

## 2016-11-19 DIAGNOSIS — K219 Gastro-esophageal reflux disease without esophagitis: Secondary | ICD-10-CM | POA: Diagnosis not present

## 2016-11-19 DIAGNOSIS — M810 Age-related osteoporosis without current pathological fracture: Secondary | ICD-10-CM | POA: Diagnosis not present

## 2016-11-19 DIAGNOSIS — Z7901 Long term (current) use of anticoagulants: Secondary | ICD-10-CM | POA: Diagnosis not present

## 2016-11-19 DIAGNOSIS — J45909 Unspecified asthma, uncomplicated: Secondary | ICD-10-CM | POA: Diagnosis not present

## 2016-11-19 DIAGNOSIS — Z87311 Personal history of (healed) other pathological fracture: Secondary | ICD-10-CM | POA: Diagnosis not present

## 2016-11-19 DIAGNOSIS — Z87440 Personal history of urinary (tract) infections: Secondary | ICD-10-CM | POA: Diagnosis not present

## 2016-11-19 DIAGNOSIS — E46 Unspecified protein-calorie malnutrition: Secondary | ICD-10-CM | POA: Diagnosis not present

## 2016-11-19 DIAGNOSIS — E1121 Type 2 diabetes mellitus with diabetic nephropathy: Secondary | ICD-10-CM | POA: Diagnosis not present

## 2016-11-19 DIAGNOSIS — Z7984 Long term (current) use of oral hypoglycemic drugs: Secondary | ICD-10-CM | POA: Diagnosis not present

## 2016-11-19 DIAGNOSIS — I10 Essential (primary) hypertension: Secondary | ICD-10-CM | POA: Diagnosis not present

## 2016-11-19 DIAGNOSIS — Z950 Presence of cardiac pacemaker: Secondary | ICD-10-CM | POA: Diagnosis not present

## 2016-11-19 DIAGNOSIS — I482 Chronic atrial fibrillation: Secondary | ICD-10-CM | POA: Diagnosis not present

## 2016-11-19 DIAGNOSIS — E039 Hypothyroidism, unspecified: Secondary | ICD-10-CM | POA: Diagnosis not present

## 2016-11-19 DIAGNOSIS — Z8673 Personal history of transient ischemic attack (TIA), and cerebral infarction without residual deficits: Secondary | ICD-10-CM | POA: Diagnosis not present

## 2016-11-20 DIAGNOSIS — Z7901 Long term (current) use of anticoagulants: Secondary | ICD-10-CM | POA: Diagnosis not present

## 2016-11-20 DIAGNOSIS — I482 Chronic atrial fibrillation: Secondary | ICD-10-CM | POA: Diagnosis not present

## 2016-11-20 DIAGNOSIS — J45909 Unspecified asthma, uncomplicated: Secondary | ICD-10-CM | POA: Diagnosis not present

## 2016-11-20 DIAGNOSIS — D649 Anemia, unspecified: Secondary | ICD-10-CM | POA: Diagnosis not present

## 2016-11-20 DIAGNOSIS — I428 Other cardiomyopathies: Secondary | ICD-10-CM | POA: Diagnosis not present

## 2016-11-20 DIAGNOSIS — I10 Essential (primary) hypertension: Secondary | ICD-10-CM | POA: Diagnosis not present

## 2016-11-20 DIAGNOSIS — E1121 Type 2 diabetes mellitus with diabetic nephropathy: Secondary | ICD-10-CM | POA: Diagnosis not present

## 2016-11-22 DIAGNOSIS — I10 Essential (primary) hypertension: Secondary | ICD-10-CM | POA: Diagnosis not present

## 2016-11-22 DIAGNOSIS — E1121 Type 2 diabetes mellitus with diabetic nephropathy: Secondary | ICD-10-CM | POA: Diagnosis not present

## 2016-11-22 DIAGNOSIS — J45909 Unspecified asthma, uncomplicated: Secondary | ICD-10-CM | POA: Diagnosis not present

## 2016-11-22 DIAGNOSIS — I482 Chronic atrial fibrillation: Secondary | ICD-10-CM | POA: Diagnosis not present

## 2016-11-22 DIAGNOSIS — D649 Anemia, unspecified: Secondary | ICD-10-CM | POA: Diagnosis not present

## 2016-11-22 DIAGNOSIS — I428 Other cardiomyopathies: Secondary | ICD-10-CM | POA: Diagnosis not present

## 2016-11-23 DIAGNOSIS — I482 Chronic atrial fibrillation: Secondary | ICD-10-CM | POA: Diagnosis not present

## 2016-11-23 DIAGNOSIS — I10 Essential (primary) hypertension: Secondary | ICD-10-CM | POA: Diagnosis not present

## 2016-11-23 DIAGNOSIS — I428 Other cardiomyopathies: Secondary | ICD-10-CM | POA: Diagnosis not present

## 2016-11-23 DIAGNOSIS — E1121 Type 2 diabetes mellitus with diabetic nephropathy: Secondary | ICD-10-CM | POA: Diagnosis not present

## 2016-11-23 DIAGNOSIS — J45909 Unspecified asthma, uncomplicated: Secondary | ICD-10-CM | POA: Diagnosis not present

## 2016-11-23 DIAGNOSIS — D649 Anemia, unspecified: Secondary | ICD-10-CM | POA: Diagnosis not present

## 2016-11-24 DIAGNOSIS — Z7984 Long term (current) use of oral hypoglycemic drugs: Secondary | ICD-10-CM | POA: Diagnosis not present

## 2016-11-24 DIAGNOSIS — I495 Sick sinus syndrome: Secondary | ICD-10-CM | POA: Diagnosis not present

## 2016-11-24 DIAGNOSIS — I482 Chronic atrial fibrillation: Secondary | ICD-10-CM | POA: Diagnosis not present

## 2016-11-24 DIAGNOSIS — Z7901 Long term (current) use of anticoagulants: Secondary | ICD-10-CM | POA: Diagnosis not present

## 2016-11-24 DIAGNOSIS — E039 Hypothyroidism, unspecified: Secondary | ICD-10-CM | POA: Diagnosis not present

## 2016-11-24 DIAGNOSIS — E1122 Type 2 diabetes mellitus with diabetic chronic kidney disease: Secondary | ICD-10-CM | POA: Diagnosis not present

## 2016-11-24 DIAGNOSIS — R54 Age-related physical debility: Secondary | ICD-10-CM | POA: Diagnosis not present

## 2016-11-27 DIAGNOSIS — I10 Essential (primary) hypertension: Secondary | ICD-10-CM | POA: Diagnosis not present

## 2016-11-27 DIAGNOSIS — I428 Other cardiomyopathies: Secondary | ICD-10-CM | POA: Diagnosis not present

## 2016-11-27 DIAGNOSIS — E1121 Type 2 diabetes mellitus with diabetic nephropathy: Secondary | ICD-10-CM | POA: Diagnosis not present

## 2016-11-27 DIAGNOSIS — I482 Chronic atrial fibrillation: Secondary | ICD-10-CM | POA: Diagnosis not present

## 2016-11-27 DIAGNOSIS — J45909 Unspecified asthma, uncomplicated: Secondary | ICD-10-CM | POA: Diagnosis not present

## 2016-11-27 DIAGNOSIS — D649 Anemia, unspecified: Secondary | ICD-10-CM | POA: Diagnosis not present

## 2016-11-28 DIAGNOSIS — D649 Anemia, unspecified: Secondary | ICD-10-CM | POA: Diagnosis not present

## 2016-11-28 DIAGNOSIS — J45909 Unspecified asthma, uncomplicated: Secondary | ICD-10-CM | POA: Diagnosis not present

## 2016-11-28 DIAGNOSIS — I482 Chronic atrial fibrillation: Secondary | ICD-10-CM | POA: Diagnosis not present

## 2016-11-28 DIAGNOSIS — I10 Essential (primary) hypertension: Secondary | ICD-10-CM | POA: Diagnosis not present

## 2016-11-28 DIAGNOSIS — E1121 Type 2 diabetes mellitus with diabetic nephropathy: Secondary | ICD-10-CM | POA: Diagnosis not present

## 2016-11-28 DIAGNOSIS — I428 Other cardiomyopathies: Secondary | ICD-10-CM | POA: Diagnosis not present

## 2016-11-29 DIAGNOSIS — J45909 Unspecified asthma, uncomplicated: Secondary | ICD-10-CM | POA: Diagnosis not present

## 2016-11-29 DIAGNOSIS — E1121 Type 2 diabetes mellitus with diabetic nephropathy: Secondary | ICD-10-CM | POA: Diagnosis not present

## 2016-11-29 DIAGNOSIS — D649 Anemia, unspecified: Secondary | ICD-10-CM | POA: Diagnosis not present

## 2016-11-29 DIAGNOSIS — I10 Essential (primary) hypertension: Secondary | ICD-10-CM | POA: Diagnosis not present

## 2016-11-29 DIAGNOSIS — I428 Other cardiomyopathies: Secondary | ICD-10-CM | POA: Diagnosis not present

## 2016-11-29 DIAGNOSIS — I482 Chronic atrial fibrillation: Secondary | ICD-10-CM | POA: Diagnosis not present

## 2016-11-30 DIAGNOSIS — J45909 Unspecified asthma, uncomplicated: Secondary | ICD-10-CM | POA: Diagnosis not present

## 2016-11-30 DIAGNOSIS — D649 Anemia, unspecified: Secondary | ICD-10-CM | POA: Diagnosis not present

## 2016-11-30 DIAGNOSIS — I428 Other cardiomyopathies: Secondary | ICD-10-CM | POA: Diagnosis not present

## 2016-11-30 DIAGNOSIS — I10 Essential (primary) hypertension: Secondary | ICD-10-CM | POA: Diagnosis not present

## 2016-11-30 DIAGNOSIS — E1121 Type 2 diabetes mellitus with diabetic nephropathy: Secondary | ICD-10-CM | POA: Diagnosis not present

## 2016-11-30 DIAGNOSIS — I482 Chronic atrial fibrillation: Secondary | ICD-10-CM | POA: Diagnosis not present

## 2016-12-01 DIAGNOSIS — E1121 Type 2 diabetes mellitus with diabetic nephropathy: Secondary | ICD-10-CM | POA: Diagnosis not present

## 2016-12-01 DIAGNOSIS — I482 Chronic atrial fibrillation: Secondary | ICD-10-CM | POA: Diagnosis not present

## 2016-12-01 DIAGNOSIS — I10 Essential (primary) hypertension: Secondary | ICD-10-CM | POA: Diagnosis not present

## 2016-12-01 DIAGNOSIS — D649 Anemia, unspecified: Secondary | ICD-10-CM | POA: Diagnosis not present

## 2016-12-01 DIAGNOSIS — I428 Other cardiomyopathies: Secondary | ICD-10-CM | POA: Diagnosis not present

## 2016-12-01 DIAGNOSIS — J45909 Unspecified asthma, uncomplicated: Secondary | ICD-10-CM | POA: Diagnosis not present

## 2016-12-05 DIAGNOSIS — J45909 Unspecified asthma, uncomplicated: Secondary | ICD-10-CM | POA: Diagnosis not present

## 2016-12-05 DIAGNOSIS — I482 Chronic atrial fibrillation: Secondary | ICD-10-CM | POA: Diagnosis not present

## 2016-12-05 DIAGNOSIS — I10 Essential (primary) hypertension: Secondary | ICD-10-CM | POA: Diagnosis not present

## 2016-12-05 DIAGNOSIS — I428 Other cardiomyopathies: Secondary | ICD-10-CM | POA: Diagnosis not present

## 2016-12-05 DIAGNOSIS — E1121 Type 2 diabetes mellitus with diabetic nephropathy: Secondary | ICD-10-CM | POA: Diagnosis not present

## 2016-12-05 DIAGNOSIS — D649 Anemia, unspecified: Secondary | ICD-10-CM | POA: Diagnosis not present

## 2016-12-06 DIAGNOSIS — I10 Essential (primary) hypertension: Secondary | ICD-10-CM | POA: Diagnosis not present

## 2016-12-06 DIAGNOSIS — I428 Other cardiomyopathies: Secondary | ICD-10-CM | POA: Diagnosis not present

## 2016-12-06 DIAGNOSIS — J45909 Unspecified asthma, uncomplicated: Secondary | ICD-10-CM | POA: Diagnosis not present

## 2016-12-06 DIAGNOSIS — E1121 Type 2 diabetes mellitus with diabetic nephropathy: Secondary | ICD-10-CM | POA: Diagnosis not present

## 2016-12-06 DIAGNOSIS — I482 Chronic atrial fibrillation: Secondary | ICD-10-CM | POA: Diagnosis not present

## 2016-12-06 DIAGNOSIS — D649 Anemia, unspecified: Secondary | ICD-10-CM | POA: Diagnosis not present

## 2016-12-07 DIAGNOSIS — I10 Essential (primary) hypertension: Secondary | ICD-10-CM | POA: Diagnosis not present

## 2016-12-07 DIAGNOSIS — I482 Chronic atrial fibrillation: Secondary | ICD-10-CM | POA: Diagnosis not present

## 2016-12-07 DIAGNOSIS — E1121 Type 2 diabetes mellitus with diabetic nephropathy: Secondary | ICD-10-CM | POA: Diagnosis not present

## 2016-12-07 DIAGNOSIS — J45909 Unspecified asthma, uncomplicated: Secondary | ICD-10-CM | POA: Diagnosis not present

## 2016-12-07 DIAGNOSIS — I428 Other cardiomyopathies: Secondary | ICD-10-CM | POA: Diagnosis not present

## 2016-12-07 DIAGNOSIS — D649 Anemia, unspecified: Secondary | ICD-10-CM | POA: Diagnosis not present

## 2016-12-08 ENCOUNTER — Encounter: Payer: Self-pay | Admitting: Internal Medicine

## 2016-12-08 ENCOUNTER — Ambulatory Visit (INDEPENDENT_AMBULATORY_CARE_PROVIDER_SITE_OTHER): Payer: Medicare Other | Admitting: Internal Medicine

## 2016-12-08 VITALS — BP 126/60 | HR 73 | Ht 62.0 in | Wt 176.4 lb

## 2016-12-08 DIAGNOSIS — I10 Essential (primary) hypertension: Secondary | ICD-10-CM | POA: Diagnosis not present

## 2016-12-08 DIAGNOSIS — I442 Atrioventricular block, complete: Secondary | ICD-10-CM | POA: Diagnosis not present

## 2016-12-08 DIAGNOSIS — I480 Paroxysmal atrial fibrillation: Secondary | ICD-10-CM

## 2016-12-08 NOTE — Patient Instructions (Signed)
Medication Instructions:  Your physician recommends that you continue on your current medications as directed. Please refer to the Current Medication list given to you today.  -- If you need a refill on your cardiac medications before your next appointment, please call your pharmacy. --  Labwork: None ordered  Testing/Procedures: None ordered  Follow-Up: Your physician wants you to follow-up in: 6 months with Amber Seiler,NP.  You will receive a reminder letter in the mail two months in advance. If you don't receive a letter, please call our office to schedule the follow-up appointment.  Remote monitoring is used to monitor your Pacemaker from home. This monitoring reduces the number of office visits required to check your device to one time per year. It allows [place] to keep an eye on the functioning of your device to ensure it is working properly. You are scheduled for a device check from home on 03/12/2016. You may send your transmission at any time that day. If you have a wireless device, the transmission will be sent automatically. After your physician reviews your transmission, you will receive a postcard with your next transmission date.    Thank you for choosing [facility]!!   Frederik Schmidt, RN [phone]  Any Other Special Instructions Will Be Listed Below (If Applicable).

## 2016-12-08 NOTE — Progress Notes (Signed)
PCP: [name], MD Primary Cardiologist: Primary EP:  Dr Lillette Boxer Tiffany [name] is a 83 y.o. female who presents today for routine electrophysiology followup.  Since last being seen in our clinic, the patient reports doing reasonably well.  She is not very active.  She was hospitalized at [facility] in September with ARF and [place] UTI.  She is making slow recovery.  SOB is stable.  She doesn't like to take lasix due to frequent urination.  Edema has improved since hospital discharge. Today, she denies symptoms of palpitations, chest pain, dizziness, presyncope, or syncope.  The patient is otherwise without complaint today.   Past Medical History:  Diagnosis Date  . Diverticulosis   . DM2 (diabetes mellitus, type 2) ([place])   . Esophageal reflux   . HTN (hypertension)   . Hyperlipidemia   . Hypothyroidism   . Microalbuminuria    last done in 1/07   . Paroxysmal atrial fibrillation (HCC)    and atrial tachycardia  . Stroke ([place]) 1999  . Tachycardia-bradycardia syndrome ([place])    s/p PPM  . Unspecified hypothyroidism 11/20/2013   Past Surgical History:  Procedure Laterality Date  . CHOLECYSTECTOMY    . COLONOSCOPY N/A 11/21/2013   Procedure: COLONOSCOPY;  Surgeon: Jeryl Columbia, MD;  Location[facility] ENDOSCOPY;  Service: Endoscopy;  Laterality: N/A;  . HEMORRHOID SURGERY    . hysterectomy (other)    . PACEMAKER GENERATOR CHANGE  10/03/12   DMT Advisa DR gen change by Dr Rayann Heman  . PACEMAKER GENERATOR CHANGE N/A 10/03/2012   Procedure: PACEMAKER GENERATOR CHANGE;  Surgeon: Thompson Grayer, MD;  Location: [facility] CATH LAB;  Service: Cardiovascular;  Laterality: N/A;  . permanent pacemaker  1999   updated 2006 (MDT) by Dr Verlon Setting; gen change 09-2012 by Dr Rayann Heman (MDT)    ROS- all systems are reviewed and negative except as per HPI above  Current Outpatient Prescriptions  Medication Sig Dispense Refill  . diltiazem (DILACOR XR) 180 MG 24 hr capsule Take 180 mg by mouth daily.    . FERGON  240 (27 FE) MG tablet Take 1 tablet by mouth every evening.   3  . furosemide (LASIX) 20 MG tablet Take 20 mg by mouth daily.    [place] Kitchen glipiZIDE (GLUCOTROL) 10 MG tablet Take 5 mg by mouth daily.     Marland Kitchen ipratropium (ATROVENT) 0.03 % nasal spray Place 2 sprays into both nostrils 2 (two) times daily as needed for rhinitis.    Marland Kitchen levothyroxine (SYNTHROID, LEVOTHROID) 25 MCG tablet Take 25 mcg by mouth daily before breakfast.    . metFORMIN (GLUCOPHAGE) 500 MG tablet Take 1 tablet by mouth 2 (two) times daily.  1  . metoprolol tartrate (LOPRESSOR) 25 MG tablet Take 50 mg by mouth 2 (two) times daily.     . ondansetron (ZOFRAN ODT) 4 MG disintegrating tablet Take 1 tablet (4 mg total) by mouth every 8 (eight) hours as needed for nausea. 10 tablet 0  . phenol (CHLORASEPTIC) 1.4 % LIQD Use as directed 1 spray in the mouth or throat 3 (three) times daily as needed for throat irritation / pain.    . potassium chloride SA (K-DUR,KLOR-CON) 20 MEQ tablet Take 20 mEq by mouth daily.    . ranitidine (ZANTAC) 150 MG capsule Take 150 mg by mouth every morning.     . rosuvastatin (CRESTOR) 10 MG tablet Take 10 mg by mouth daily.    Marland Kitchen warfarin (COUMADIN) 2.5 MG tablet Take 1 tablet (2.5  mg total) by mouth every other day. Recheck INR on Monday 9/11 and if INR <3 then resume Coumadin 2.5mg  every other day for goal INR 2-3range    . XOPENEX HFA 45 MCG/ACT inhaler Inhale 2 puffs into the lungs daily as needed for shortness of breath or wheezing.     No current facility-administered medications for this visit.     Physical Exam: Vitals:   12/08/16 1427  BP: 126/60  Pulse: 73  SpO2: 92%  Weight: 176 lb 6.4 oz (80 kg)  Height: 5\' 2"  (1.575 m)    GEN- The patient is elderly appearing, alert and oriented x 3 today.   Head- normocephalic, atraumatic Eyes-  Sclera clear, conjunctiva pink Ears- hearing intact Oropharynx- clear Lungs- few basilar rales, normal work of breathing Chest- pacemaker pocket is well  healed Heart- Regular rate and rhythm 2/6 SEM LUSB GI- soft, NT, ND, + BS Extremities- no clubbing, cyanosis, or edema  Pacemaker interrogation- reviewed in detail today,  See PACEART report  ekg tracing ordered today is personally reviewed and shows AV paced  Assessment and Plan:  1. Symptomatic sinus standstill and complete heart block Normal pacemaker function See Pace Art report No changes today  2. Paroxysmal atrial fibrillation Has not tolerated xarelto previously Coumadin (INRs followed by PCP)  3. HTN Stable No change required today  Carelink every 3 months Follow-up with EP NP in 6 months  Thompson Grayer MD, [facility] 12/08/2016 2:46 PM

## 2016-12-11 DIAGNOSIS — I428 Other cardiomyopathies: Secondary | ICD-10-CM | POA: Diagnosis not present

## 2016-12-11 DIAGNOSIS — I10 Essential (primary) hypertension: Secondary | ICD-10-CM | POA: Diagnosis not present

## 2016-12-11 DIAGNOSIS — E1121 Type 2 diabetes mellitus with diabetic nephropathy: Secondary | ICD-10-CM | POA: Diagnosis not present

## 2016-12-11 DIAGNOSIS — J45909 Unspecified asthma, uncomplicated: Secondary | ICD-10-CM | POA: Diagnosis not present

## 2016-12-11 DIAGNOSIS — I482 Chronic atrial fibrillation: Secondary | ICD-10-CM | POA: Diagnosis not present

## 2016-12-11 DIAGNOSIS — D649 Anemia, unspecified: Secondary | ICD-10-CM | POA: Diagnosis not present

## 2016-12-12 DIAGNOSIS — J45909 Unspecified asthma, uncomplicated: Secondary | ICD-10-CM | POA: Diagnosis not present

## 2016-12-12 DIAGNOSIS — I10 Essential (primary) hypertension: Secondary | ICD-10-CM | POA: Diagnosis not present

## 2016-12-12 DIAGNOSIS — I482 Chronic atrial fibrillation: Secondary | ICD-10-CM | POA: Diagnosis not present

## 2016-12-12 DIAGNOSIS — D649 Anemia, unspecified: Secondary | ICD-10-CM | POA: Diagnosis not present

## 2016-12-12 DIAGNOSIS — E1121 Type 2 diabetes mellitus with diabetic nephropathy: Secondary | ICD-10-CM | POA: Diagnosis not present

## 2016-12-12 DIAGNOSIS — I428 Other cardiomyopathies: Secondary | ICD-10-CM | POA: Diagnosis not present

## 2016-12-14 DIAGNOSIS — J45909 Unspecified asthma, uncomplicated: Secondary | ICD-10-CM | POA: Diagnosis not present

## 2016-12-14 DIAGNOSIS — E1121 Type 2 diabetes mellitus with diabetic nephropathy: Secondary | ICD-10-CM | POA: Diagnosis not present

## 2016-12-14 DIAGNOSIS — I10 Essential (primary) hypertension: Secondary | ICD-10-CM | POA: Diagnosis not present

## 2016-12-14 DIAGNOSIS — I482 Chronic atrial fibrillation: Secondary | ICD-10-CM | POA: Diagnosis not present

## 2016-12-14 DIAGNOSIS — I428 Other cardiomyopathies: Secondary | ICD-10-CM | POA: Diagnosis not present

## 2016-12-14 DIAGNOSIS — D649 Anemia, unspecified: Secondary | ICD-10-CM | POA: Diagnosis not present

## 2016-12-15 DIAGNOSIS — J45909 Unspecified asthma, uncomplicated: Secondary | ICD-10-CM | POA: Diagnosis not present

## 2016-12-15 DIAGNOSIS — I482 Chronic atrial fibrillation: Secondary | ICD-10-CM | POA: Diagnosis not present

## 2016-12-15 DIAGNOSIS — D649 Anemia, unspecified: Secondary | ICD-10-CM | POA: Diagnosis not present

## 2016-12-15 DIAGNOSIS — E1121 Type 2 diabetes mellitus with diabetic nephropathy: Secondary | ICD-10-CM | POA: Diagnosis not present

## 2016-12-15 DIAGNOSIS — I428 Other cardiomyopathies: Secondary | ICD-10-CM | POA: Diagnosis not present

## 2016-12-15 DIAGNOSIS — I10 Essential (primary) hypertension: Secondary | ICD-10-CM | POA: Diagnosis not present

## 2016-12-19 DIAGNOSIS — J45909 Unspecified asthma, uncomplicated: Secondary | ICD-10-CM | POA: Diagnosis not present

## 2016-12-19 DIAGNOSIS — I428 Other cardiomyopathies: Secondary | ICD-10-CM | POA: Diagnosis not present

## 2016-12-19 DIAGNOSIS — I10 Essential (primary) hypertension: Secondary | ICD-10-CM | POA: Diagnosis not present

## 2016-12-19 DIAGNOSIS — D649 Anemia, unspecified: Secondary | ICD-10-CM | POA: Diagnosis not present

## 2016-12-19 DIAGNOSIS — E1121 Type 2 diabetes mellitus with diabetic nephropathy: Secondary | ICD-10-CM | POA: Diagnosis not present

## 2016-12-19 DIAGNOSIS — I482 Chronic atrial fibrillation: Secondary | ICD-10-CM | POA: Diagnosis not present

## 2016-12-22 DIAGNOSIS — I10 Essential (primary) hypertension: Secondary | ICD-10-CM | POA: Diagnosis not present

## 2016-12-22 DIAGNOSIS — D649 Anemia, unspecified: Secondary | ICD-10-CM | POA: Diagnosis not present

## 2016-12-22 DIAGNOSIS — J45909 Unspecified asthma, uncomplicated: Secondary | ICD-10-CM | POA: Diagnosis not present

## 2016-12-22 DIAGNOSIS — I482 Chronic atrial fibrillation: Secondary | ICD-10-CM | POA: Diagnosis not present

## 2016-12-22 DIAGNOSIS — E1121 Type 2 diabetes mellitus with diabetic nephropathy: Secondary | ICD-10-CM | POA: Diagnosis not present

## 2016-12-22 DIAGNOSIS — I428 Other cardiomyopathies: Secondary | ICD-10-CM | POA: Diagnosis not present

## 2016-12-26 DIAGNOSIS — I10 Essential (primary) hypertension: Secondary | ICD-10-CM | POA: Diagnosis not present

## 2016-12-26 DIAGNOSIS — I428 Other cardiomyopathies: Secondary | ICD-10-CM | POA: Diagnosis not present

## 2016-12-26 DIAGNOSIS — E1121 Type 2 diabetes mellitus with diabetic nephropathy: Secondary | ICD-10-CM | POA: Diagnosis not present

## 2016-12-26 DIAGNOSIS — I482 Chronic atrial fibrillation: Secondary | ICD-10-CM | POA: Diagnosis not present

## 2016-12-26 DIAGNOSIS — D649 Anemia, unspecified: Secondary | ICD-10-CM | POA: Diagnosis not present

## 2016-12-26 DIAGNOSIS — J45909 Unspecified asthma, uncomplicated: Secondary | ICD-10-CM | POA: Diagnosis not present

## 2016-12-29 DIAGNOSIS — I428 Other cardiomyopathies: Secondary | ICD-10-CM | POA: Diagnosis not present

## 2016-12-29 DIAGNOSIS — D649 Anemia, unspecified: Secondary | ICD-10-CM | POA: Diagnosis not present

## 2016-12-29 DIAGNOSIS — J45909 Unspecified asthma, uncomplicated: Secondary | ICD-10-CM | POA: Diagnosis not present

## 2016-12-29 DIAGNOSIS — I482 Chronic atrial fibrillation: Secondary | ICD-10-CM | POA: Diagnosis not present

## 2016-12-29 DIAGNOSIS — I10 Essential (primary) hypertension: Secondary | ICD-10-CM | POA: Diagnosis not present

## 2016-12-29 DIAGNOSIS — E1121 Type 2 diabetes mellitus with diabetic nephropathy: Secondary | ICD-10-CM | POA: Diagnosis not present

## 2017-01-03 DIAGNOSIS — E1121 Type 2 diabetes mellitus with diabetic nephropathy: Secondary | ICD-10-CM | POA: Diagnosis not present

## 2017-01-03 DIAGNOSIS — I482 Chronic atrial fibrillation: Secondary | ICD-10-CM | POA: Diagnosis not present

## 2017-01-03 DIAGNOSIS — I10 Essential (primary) hypertension: Secondary | ICD-10-CM | POA: Diagnosis not present

## 2017-01-03 DIAGNOSIS — J45909 Unspecified asthma, uncomplicated: Secondary | ICD-10-CM | POA: Diagnosis not present

## 2017-01-03 DIAGNOSIS — I428 Other cardiomyopathies: Secondary | ICD-10-CM | POA: Diagnosis not present

## 2017-01-03 DIAGNOSIS — D649 Anemia, unspecified: Secondary | ICD-10-CM | POA: Diagnosis not present

## 2017-01-12 DIAGNOSIS — E1121 Type 2 diabetes mellitus with diabetic nephropathy: Secondary | ICD-10-CM | POA: Diagnosis not present

## 2017-01-12 DIAGNOSIS — J45909 Unspecified asthma, uncomplicated: Secondary | ICD-10-CM | POA: Diagnosis not present

## 2017-01-12 DIAGNOSIS — D649 Anemia, unspecified: Secondary | ICD-10-CM | POA: Diagnosis not present

## 2017-01-12 DIAGNOSIS — I428 Other cardiomyopathies: Secondary | ICD-10-CM | POA: Diagnosis not present

## 2017-01-12 DIAGNOSIS — I10 Essential (primary) hypertension: Secondary | ICD-10-CM | POA: Diagnosis not present

## 2017-01-12 DIAGNOSIS — I482 Chronic atrial fibrillation: Secondary | ICD-10-CM | POA: Diagnosis not present

## 2017-01-16 DIAGNOSIS — I428 Other cardiomyopathies: Secondary | ICD-10-CM | POA: Diagnosis not present

## 2017-01-16 DIAGNOSIS — I10 Essential (primary) hypertension: Secondary | ICD-10-CM | POA: Diagnosis not present

## 2017-01-16 DIAGNOSIS — Z7901 Long term (current) use of anticoagulants: Secondary | ICD-10-CM | POA: Diagnosis not present

## 2017-01-16 DIAGNOSIS — D649 Anemia, unspecified: Secondary | ICD-10-CM | POA: Diagnosis not present

## 2017-01-16 DIAGNOSIS — E1121 Type 2 diabetes mellitus with diabetic nephropathy: Secondary | ICD-10-CM | POA: Diagnosis not present

## 2017-01-16 DIAGNOSIS — J45909 Unspecified asthma, uncomplicated: Secondary | ICD-10-CM | POA: Diagnosis not present

## 2017-01-16 DIAGNOSIS — I482 Chronic atrial fibrillation: Secondary | ICD-10-CM | POA: Diagnosis not present

## 2017-01-23 DIAGNOSIS — D3132 Benign neoplasm of left choroid: Secondary | ICD-10-CM | POA: Diagnosis not present

## 2017-01-23 DIAGNOSIS — E113293 Type 2 diabetes mellitus with mild nonproliferative diabetic retinopathy without macular edema, bilateral: Secondary | ICD-10-CM | POA: Diagnosis not present

## 2017-01-29 DIAGNOSIS — E039 Hypothyroidism, unspecified: Secondary | ICD-10-CM | POA: Diagnosis not present

## 2017-01-29 DIAGNOSIS — Z7984 Long term (current) use of oral hypoglycemic drugs: Secondary | ICD-10-CM | POA: Diagnosis not present

## 2017-01-29 DIAGNOSIS — E1122 Type 2 diabetes mellitus with diabetic chronic kidney disease: Secondary | ICD-10-CM | POA: Diagnosis not present

## 2017-01-29 DIAGNOSIS — Z7901 Long term (current) use of anticoagulants: Secondary | ICD-10-CM | POA: Diagnosis not present

## 2017-01-29 DIAGNOSIS — I10 Essential (primary) hypertension: Secondary | ICD-10-CM | POA: Diagnosis not present

## 2017-01-29 DIAGNOSIS — R54 Age-related physical debility: Secondary | ICD-10-CM | POA: Diagnosis not present

## 2017-01-29 DIAGNOSIS — I495 Sick sinus syndrome: Secondary | ICD-10-CM | POA: Diagnosis not present

## 2017-01-29 DIAGNOSIS — I482 Chronic atrial fibrillation: Secondary | ICD-10-CM | POA: Diagnosis not present

## 2017-02-06 DIAGNOSIS — I10 Essential (primary) hypertension: Secondary | ICD-10-CM | POA: Diagnosis not present

## 2017-02-06 DIAGNOSIS — M6281 Muscle weakness (generalized): Secondary | ICD-10-CM | POA: Diagnosis not present

## 2017-02-06 DIAGNOSIS — K5790 Diverticulosis of intestine, part unspecified, without perforation or abscess without bleeding: Secondary | ICD-10-CM | POA: Diagnosis not present

## 2017-02-06 DIAGNOSIS — Z7901 Long term (current) use of anticoagulants: Secondary | ICD-10-CM | POA: Diagnosis not present

## 2017-02-06 DIAGNOSIS — I480 Paroxysmal atrial fibrillation: Secondary | ICD-10-CM | POA: Diagnosis not present

## 2017-02-06 DIAGNOSIS — E039 Hypothyroidism, unspecified: Secondary | ICD-10-CM | POA: Diagnosis not present

## 2017-02-06 DIAGNOSIS — Z8673 Personal history of transient ischemic attack (TIA), and cerebral infarction without residual deficits: Secondary | ICD-10-CM | POA: Diagnosis not present

## 2017-02-06 DIAGNOSIS — K219 Gastro-esophageal reflux disease without esophagitis: Secondary | ICD-10-CM | POA: Diagnosis not present

## 2017-02-06 DIAGNOSIS — Z7984 Long term (current) use of oral hypoglycemic drugs: Secondary | ICD-10-CM | POA: Diagnosis not present

## 2017-02-06 DIAGNOSIS — E119 Type 2 diabetes mellitus without complications: Secondary | ICD-10-CM | POA: Diagnosis not present

## 2017-02-08 DIAGNOSIS — E119 Type 2 diabetes mellitus without complications: Secondary | ICD-10-CM | POA: Diagnosis not present

## 2017-02-08 DIAGNOSIS — K5790 Diverticulosis of intestine, part unspecified, without perforation or abscess without bleeding: Secondary | ICD-10-CM | POA: Diagnosis not present

## 2017-02-08 DIAGNOSIS — M6281 Muscle weakness (generalized): Secondary | ICD-10-CM | POA: Diagnosis not present

## 2017-02-08 DIAGNOSIS — Z7901 Long term (current) use of anticoagulants: Secondary | ICD-10-CM | POA: Diagnosis not present

## 2017-02-08 DIAGNOSIS — I482 Chronic atrial fibrillation: Secondary | ICD-10-CM | POA: Diagnosis not present

## 2017-02-08 DIAGNOSIS — I10 Essential (primary) hypertension: Secondary | ICD-10-CM | POA: Diagnosis not present

## 2017-02-08 DIAGNOSIS — K219 Gastro-esophageal reflux disease without esophagitis: Secondary | ICD-10-CM | POA: Diagnosis not present

## 2017-02-08 DIAGNOSIS — I480 Paroxysmal atrial fibrillation: Secondary | ICD-10-CM | POA: Diagnosis not present

## 2017-02-08 DIAGNOSIS — N183 Chronic kidney disease, stage 3 (moderate): Secondary | ICD-10-CM | POA: Diagnosis not present

## 2017-02-14 DIAGNOSIS — I480 Paroxysmal atrial fibrillation: Secondary | ICD-10-CM | POA: Diagnosis not present

## 2017-02-14 DIAGNOSIS — M6281 Muscle weakness (generalized): Secondary | ICD-10-CM | POA: Diagnosis not present

## 2017-02-14 DIAGNOSIS — E119 Type 2 diabetes mellitus without complications: Secondary | ICD-10-CM | POA: Diagnosis not present

## 2017-02-14 DIAGNOSIS — I10 Essential (primary) hypertension: Secondary | ICD-10-CM | POA: Diagnosis not present

## 2017-02-14 DIAGNOSIS — K5790 Diverticulosis of intestine, part unspecified, without perforation or abscess without bleeding: Secondary | ICD-10-CM | POA: Diagnosis not present

## 2017-02-14 DIAGNOSIS — K219 Gastro-esophageal reflux disease without esophagitis: Secondary | ICD-10-CM | POA: Diagnosis not present

## 2017-02-22 DIAGNOSIS — I480 Paroxysmal atrial fibrillation: Secondary | ICD-10-CM | POA: Diagnosis not present

## 2017-02-22 DIAGNOSIS — E119 Type 2 diabetes mellitus without complications: Secondary | ICD-10-CM | POA: Diagnosis not present

## 2017-02-22 DIAGNOSIS — I10 Essential (primary) hypertension: Secondary | ICD-10-CM | POA: Diagnosis not present

## 2017-02-22 DIAGNOSIS — K5790 Diverticulosis of intestine, part unspecified, without perforation or abscess without bleeding: Secondary | ICD-10-CM | POA: Diagnosis not present

## 2017-02-22 DIAGNOSIS — M6281 Muscle weakness (generalized): Secondary | ICD-10-CM | POA: Diagnosis not present

## 2017-02-22 DIAGNOSIS — K219 Gastro-esophageal reflux disease without esophagitis: Secondary | ICD-10-CM | POA: Diagnosis not present

## 2017-02-23 DIAGNOSIS — K219 Gastro-esophageal reflux disease without esophagitis: Secondary | ICD-10-CM | POA: Diagnosis not present

## 2017-02-23 DIAGNOSIS — M6281 Muscle weakness (generalized): Secondary | ICD-10-CM | POA: Diagnosis not present

## 2017-02-23 DIAGNOSIS — I10 Essential (primary) hypertension: Secondary | ICD-10-CM | POA: Diagnosis not present

## 2017-02-23 DIAGNOSIS — E119 Type 2 diabetes mellitus without complications: Secondary | ICD-10-CM | POA: Diagnosis not present

## 2017-02-23 DIAGNOSIS — K5790 Diverticulosis of intestine, part unspecified, without perforation or abscess without bleeding: Secondary | ICD-10-CM | POA: Diagnosis not present

## 2017-02-23 DIAGNOSIS — I480 Paroxysmal atrial fibrillation: Secondary | ICD-10-CM | POA: Diagnosis not present

## 2017-02-28 DIAGNOSIS — K219 Gastro-esophageal reflux disease without esophagitis: Secondary | ICD-10-CM | POA: Diagnosis not present

## 2017-02-28 DIAGNOSIS — I10 Essential (primary) hypertension: Secondary | ICD-10-CM | POA: Diagnosis not present

## 2017-02-28 DIAGNOSIS — K5790 Diverticulosis of intestine, part unspecified, without perforation or abscess without bleeding: Secondary | ICD-10-CM | POA: Diagnosis not present

## 2017-02-28 DIAGNOSIS — I480 Paroxysmal atrial fibrillation: Secondary | ICD-10-CM | POA: Diagnosis not present

## 2017-02-28 DIAGNOSIS — M6281 Muscle weakness (generalized): Secondary | ICD-10-CM | POA: Diagnosis not present

## 2017-02-28 DIAGNOSIS — E119 Type 2 diabetes mellitus without complications: Secondary | ICD-10-CM | POA: Diagnosis not present

## 2017-03-09 DIAGNOSIS — K5790 Diverticulosis of intestine, part unspecified, without perforation or abscess without bleeding: Secondary | ICD-10-CM | POA: Diagnosis not present

## 2017-03-09 DIAGNOSIS — K219 Gastro-esophageal reflux disease without esophagitis: Secondary | ICD-10-CM | POA: Diagnosis not present

## 2017-03-09 DIAGNOSIS — I10 Essential (primary) hypertension: Secondary | ICD-10-CM | POA: Diagnosis not present

## 2017-03-09 DIAGNOSIS — I480 Paroxysmal atrial fibrillation: Secondary | ICD-10-CM | POA: Diagnosis not present

## 2017-03-09 DIAGNOSIS — E119 Type 2 diabetes mellitus without complications: Secondary | ICD-10-CM | POA: Diagnosis not present

## 2017-03-09 DIAGNOSIS — M6281 Muscle weakness (generalized): Secondary | ICD-10-CM | POA: Diagnosis not present

## 2017-03-16 DIAGNOSIS — I10 Essential (primary) hypertension: Secondary | ICD-10-CM | POA: Diagnosis not present

## 2017-03-16 DIAGNOSIS — I480 Paroxysmal atrial fibrillation: Secondary | ICD-10-CM | POA: Diagnosis not present

## 2017-03-16 DIAGNOSIS — K219 Gastro-esophageal reflux disease without esophagitis: Secondary | ICD-10-CM | POA: Diagnosis not present

## 2017-03-16 DIAGNOSIS — E119 Type 2 diabetes mellitus without complications: Secondary | ICD-10-CM | POA: Diagnosis not present

## 2017-03-16 DIAGNOSIS — K5790 Diverticulosis of intestine, part unspecified, without perforation or abscess without bleeding: Secondary | ICD-10-CM | POA: Diagnosis not present

## 2017-03-16 DIAGNOSIS — M6281 Muscle weakness (generalized): Secondary | ICD-10-CM | POA: Diagnosis not present

## 2017-03-23 DIAGNOSIS — E119 Type 2 diabetes mellitus without complications: Secondary | ICD-10-CM | POA: Diagnosis not present

## 2017-03-23 DIAGNOSIS — M6281 Muscle weakness (generalized): Secondary | ICD-10-CM | POA: Diagnosis not present

## 2017-03-23 DIAGNOSIS — I480 Paroxysmal atrial fibrillation: Secondary | ICD-10-CM | POA: Diagnosis not present

## 2017-03-23 DIAGNOSIS — K219 Gastro-esophageal reflux disease without esophagitis: Secondary | ICD-10-CM | POA: Diagnosis not present

## 2017-03-23 DIAGNOSIS — I10 Essential (primary) hypertension: Secondary | ICD-10-CM | POA: Diagnosis not present

## 2017-03-23 DIAGNOSIS — K5790 Diverticulosis of intestine, part unspecified, without perforation or abscess without bleeding: Secondary | ICD-10-CM | POA: Diagnosis not present

## 2017-03-30 DIAGNOSIS — I480 Paroxysmal atrial fibrillation: Secondary | ICD-10-CM | POA: Diagnosis not present

## 2017-03-30 DIAGNOSIS — E119 Type 2 diabetes mellitus without complications: Secondary | ICD-10-CM | POA: Diagnosis not present

## 2017-03-30 DIAGNOSIS — I10 Essential (primary) hypertension: Secondary | ICD-10-CM | POA: Diagnosis not present

## 2017-03-30 DIAGNOSIS — M6281 Muscle weakness (generalized): Secondary | ICD-10-CM | POA: Diagnosis not present

## 2017-03-30 DIAGNOSIS — K5790 Diverticulosis of intestine, part unspecified, without perforation or abscess without bleeding: Secondary | ICD-10-CM | POA: Diagnosis not present

## 2017-03-30 DIAGNOSIS — K219 Gastro-esophageal reflux disease without esophagitis: Secondary | ICD-10-CM | POA: Diagnosis not present

## 2017-04-06 DIAGNOSIS — K5790 Diverticulosis of intestine, part unspecified, without perforation or abscess without bleeding: Secondary | ICD-10-CM | POA: Diagnosis not present

## 2017-04-06 DIAGNOSIS — I10 Essential (primary) hypertension: Secondary | ICD-10-CM | POA: Diagnosis not present

## 2017-04-06 DIAGNOSIS — K219 Gastro-esophageal reflux disease without esophagitis: Secondary | ICD-10-CM | POA: Diagnosis not present

## 2017-04-06 DIAGNOSIS — I480 Paroxysmal atrial fibrillation: Secondary | ICD-10-CM | POA: Diagnosis not present

## 2017-04-06 DIAGNOSIS — E119 Type 2 diabetes mellitus without complications: Secondary | ICD-10-CM | POA: Diagnosis not present

## 2017-04-06 DIAGNOSIS — M6281 Muscle weakness (generalized): Secondary | ICD-10-CM | POA: Diagnosis not present

## 2017-04-07 DIAGNOSIS — K219 Gastro-esophageal reflux disease without esophagitis: Secondary | ICD-10-CM | POA: Diagnosis not present

## 2017-04-07 DIAGNOSIS — E119 Type 2 diabetes mellitus without complications: Secondary | ICD-10-CM | POA: Diagnosis not present

## 2017-04-07 DIAGNOSIS — Z7984 Long term (current) use of oral hypoglycemic drugs: Secondary | ICD-10-CM | POA: Diagnosis not present

## 2017-04-07 DIAGNOSIS — Z5181 Encounter for therapeutic drug level monitoring: Secondary | ICD-10-CM | POA: Diagnosis not present

## 2017-04-07 DIAGNOSIS — M6281 Muscle weakness (generalized): Secondary | ICD-10-CM | POA: Diagnosis not present

## 2017-04-07 DIAGNOSIS — E039 Hypothyroidism, unspecified: Secondary | ICD-10-CM | POA: Diagnosis not present

## 2017-04-07 DIAGNOSIS — K5790 Diverticulosis of intestine, part unspecified, without perforation or abscess without bleeding: Secondary | ICD-10-CM | POA: Diagnosis not present

## 2017-04-07 DIAGNOSIS — Z7901 Long term (current) use of anticoagulants: Secondary | ICD-10-CM | POA: Diagnosis not present

## 2017-04-07 DIAGNOSIS — I10 Essential (primary) hypertension: Secondary | ICD-10-CM | POA: Diagnosis not present

## 2017-04-07 DIAGNOSIS — Z8673 Personal history of transient ischemic attack (TIA), and cerebral infarction without residual deficits: Secondary | ICD-10-CM | POA: Diagnosis not present

## 2017-04-07 DIAGNOSIS — I480 Paroxysmal atrial fibrillation: Secondary | ICD-10-CM | POA: Diagnosis not present

## 2017-04-10 DIAGNOSIS — I10 Essential (primary) hypertension: Secondary | ICD-10-CM | POA: Diagnosis not present

## 2017-04-10 DIAGNOSIS — I480 Paroxysmal atrial fibrillation: Secondary | ICD-10-CM | POA: Diagnosis not present

## 2017-04-10 DIAGNOSIS — K5790 Diverticulosis of intestine, part unspecified, without perforation or abscess without bleeding: Secondary | ICD-10-CM | POA: Diagnosis not present

## 2017-04-10 DIAGNOSIS — E119 Type 2 diabetes mellitus without complications: Secondary | ICD-10-CM | POA: Diagnosis not present

## 2017-04-10 DIAGNOSIS — K219 Gastro-esophageal reflux disease without esophagitis: Secondary | ICD-10-CM | POA: Diagnosis not present

## 2017-04-10 DIAGNOSIS — M6281 Muscle weakness (generalized): Secondary | ICD-10-CM | POA: Diagnosis not present

## 2017-04-13 DIAGNOSIS — E119 Type 2 diabetes mellitus without complications: Secondary | ICD-10-CM | POA: Diagnosis not present

## 2017-04-13 DIAGNOSIS — I10 Essential (primary) hypertension: Secondary | ICD-10-CM | POA: Diagnosis not present

## 2017-04-13 DIAGNOSIS — I480 Paroxysmal atrial fibrillation: Secondary | ICD-10-CM | POA: Diagnosis not present

## 2017-04-13 DIAGNOSIS — K5790 Diverticulosis of intestine, part unspecified, without perforation or abscess without bleeding: Secondary | ICD-10-CM | POA: Diagnosis not present

## 2017-04-13 DIAGNOSIS — K219 Gastro-esophageal reflux disease without esophagitis: Secondary | ICD-10-CM | POA: Diagnosis not present

## 2017-04-13 DIAGNOSIS — M6281 Muscle weakness (generalized): Secondary | ICD-10-CM | POA: Diagnosis not present

## 2017-04-20 DIAGNOSIS — K219 Gastro-esophageal reflux disease without esophagitis: Secondary | ICD-10-CM | POA: Diagnosis not present

## 2017-04-20 DIAGNOSIS — I480 Paroxysmal atrial fibrillation: Secondary | ICD-10-CM | POA: Diagnosis not present

## 2017-04-20 DIAGNOSIS — K5790 Diverticulosis of intestine, part unspecified, without perforation or abscess without bleeding: Secondary | ICD-10-CM | POA: Diagnosis not present

## 2017-04-20 DIAGNOSIS — E119 Type 2 diabetes mellitus without complications: Secondary | ICD-10-CM | POA: Diagnosis not present

## 2017-04-20 DIAGNOSIS — M6281 Muscle weakness (generalized): Secondary | ICD-10-CM | POA: Diagnosis not present

## 2017-04-20 DIAGNOSIS — I10 Essential (primary) hypertension: Secondary | ICD-10-CM | POA: Diagnosis not present

## 2017-04-28 DIAGNOSIS — M6281 Muscle weakness (generalized): Secondary | ICD-10-CM | POA: Diagnosis not present

## 2017-04-28 DIAGNOSIS — I10 Essential (primary) hypertension: Secondary | ICD-10-CM | POA: Diagnosis not present

## 2017-04-28 DIAGNOSIS — K219 Gastro-esophageal reflux disease without esophagitis: Secondary | ICD-10-CM | POA: Diagnosis not present

## 2017-04-28 DIAGNOSIS — K5790 Diverticulosis of intestine, part unspecified, without perforation or abscess without bleeding: Secondary | ICD-10-CM | POA: Diagnosis not present

## 2017-04-28 DIAGNOSIS — E119 Type 2 diabetes mellitus without complications: Secondary | ICD-10-CM | POA: Diagnosis not present

## 2017-04-28 DIAGNOSIS — I480 Paroxysmal atrial fibrillation: Secondary | ICD-10-CM | POA: Diagnosis not present

## 2017-05-04 DIAGNOSIS — K5790 Diverticulosis of intestine, part unspecified, without perforation or abscess without bleeding: Secondary | ICD-10-CM | POA: Diagnosis not present

## 2017-05-04 DIAGNOSIS — I10 Essential (primary) hypertension: Secondary | ICD-10-CM | POA: Diagnosis not present

## 2017-05-04 DIAGNOSIS — M6281 Muscle weakness (generalized): Secondary | ICD-10-CM | POA: Diagnosis not present

## 2017-05-04 DIAGNOSIS — I480 Paroxysmal atrial fibrillation: Secondary | ICD-10-CM | POA: Diagnosis not present

## 2017-05-04 DIAGNOSIS — E119 Type 2 diabetes mellitus without complications: Secondary | ICD-10-CM | POA: Diagnosis not present

## 2017-05-04 DIAGNOSIS — K219 Gastro-esophageal reflux disease without esophagitis: Secondary | ICD-10-CM | POA: Diagnosis not present

## 2017-05-11 DIAGNOSIS — M6281 Muscle weakness (generalized): Secondary | ICD-10-CM | POA: Diagnosis not present

## 2017-05-11 DIAGNOSIS — K5790 Diverticulosis of intestine, part unspecified, without perforation or abscess without bleeding: Secondary | ICD-10-CM | POA: Diagnosis not present

## 2017-05-11 DIAGNOSIS — E119 Type 2 diabetes mellitus without complications: Secondary | ICD-10-CM | POA: Diagnosis not present

## 2017-05-11 DIAGNOSIS — K219 Gastro-esophageal reflux disease without esophagitis: Secondary | ICD-10-CM | POA: Diagnosis not present

## 2017-05-11 DIAGNOSIS — I480 Paroxysmal atrial fibrillation: Secondary | ICD-10-CM | POA: Diagnosis not present

## 2017-05-11 DIAGNOSIS — I10 Essential (primary) hypertension: Secondary | ICD-10-CM | POA: Diagnosis not present

## 2017-05-15 ENCOUNTER — Encounter: Payer: Self-pay | Admitting: Nurse Practitioner

## 2017-05-17 DIAGNOSIS — I10 Essential (primary) hypertension: Secondary | ICD-10-CM | POA: Diagnosis not present

## 2017-05-17 DIAGNOSIS — K219 Gastro-esophageal reflux disease without esophagitis: Secondary | ICD-10-CM | POA: Diagnosis not present

## 2017-05-17 DIAGNOSIS — E119 Type 2 diabetes mellitus without complications: Secondary | ICD-10-CM | POA: Diagnosis not present

## 2017-05-17 DIAGNOSIS — K5790 Diverticulosis of intestine, part unspecified, without perforation or abscess without bleeding: Secondary | ICD-10-CM | POA: Diagnosis not present

## 2017-05-17 DIAGNOSIS — I480 Paroxysmal atrial fibrillation: Secondary | ICD-10-CM | POA: Diagnosis not present

## 2017-05-17 DIAGNOSIS — M6281 Muscle weakness (generalized): Secondary | ICD-10-CM | POA: Diagnosis not present

## 2017-05-22 NOTE — Progress Notes (Signed)
Electrophysiology Office Note Date: 05/31/2017  ID:  Tiffany [name], DOB [birth year], MRN [id number]  PCP: [name], MD Electrophysiologist: Rayann Heman  CC: Pacemaker follow-up  Tiffany [name] is a 84 y.o. female seen today for Dr Rayann Heman.  She presents today for routine electrophysiology followup.  Since last being seen in our clinic, the patient reports doing reasonably well.  She continues to make slow recovery.  Shortness of breath is stable.   She denies chest pain, palpitations, PND, orthopnea, nausea, vomiting, dizziness, syncope, edema, weight gain, or early satiety.  Device History: MDT dual chamber PPM implanted 1999 for SSS; gen change 2006; gen change 2014   Past Medical History:  Diagnosis Date  . Diverticulosis   . DM2 (diabetes mellitus, type 2) ([place])   . Esophageal reflux   . HTN (hypertension)   . Hyperlipidemia   . Hypothyroidism   . Microalbuminuria    last done in 1/07   . Paroxysmal atrial fibrillation (HCC)    and atrial tachycardia  . Stroke ([place]) 1999  . Tachycardia-bradycardia syndrome ([place])    s/p PPM  . Unspecified hypothyroidism 11/20/2013   Past Surgical History:  Procedure Laterality Date  . CHOLECYSTECTOMY    . COLONOSCOPY N/A 11/21/2013   Procedure: COLONOSCOPY;  Surgeon: Jeryl Columbia, MD;  Location: [facility] ENDOSCOPY;  Service: Endoscopy;  Laterality: N/A;  . HEMORRHOID SURGERY    . hysterectomy (other)    . PACEMAKER GENERATOR CHANGE  10/03/12   DMT Advisa DR gen change by Dr Rayann Heman  . PACEMAKER GENERATOR CHANGE N/A 10/03/2012   Procedure: PACEMAKER GENERATOR CHANGE;  Surgeon: Thompson Grayer, MD;  Location: [facility] CATH LAB;  Service: Cardiovascular;  Laterality: N/A;  . permanent pacemaker  1999   updated 2006 (MDT) by Dr Verlon Setting; gen change 09-2012 by Dr Rayann Heman (MDT)    Current Outpatient Medications  Medication Sig Dispense Refill  . diltiazem (DILACOR XR) 180 MG 24 hr capsule Take 180 mg by mouth daily.    . FERGON 240 (27 FE) MG  tablet Take 1 tablet by mouth every evening.   3  . furosemide (LASIX) 20 MG tablet Take 20 mg by mouth daily.    Marland Kitchen glipiZIDE (GLUCOTROL) 10 MG tablet Take 5 mg by mouth daily.     Marland Kitchen levothyroxine (SYNTHROID, LEVOTHROID) 25 MCG tablet Take 25 mcg by mouth daily before breakfast.    . metFORMIN (GLUCOPHAGE) 500 MG tablet Take 1 tablet by mouth 2 (two) times daily.  1  . metoprolol tartrate (LOPRESSOR) 25 MG tablet Take 50 mg by mouth 2 (two) times daily.     . phenol (CHLORASEPTIC) 1.4 % LIQD Use as directed 1 spray in the mouth or throat 3 (three) times daily as needed for throat irritation / pain.    . potassium chloride SA (K-DUR,KLOR-CON) 20 MEQ tablet Take 20 mEq by mouth daily.    . ranitidine (ZANTAC) 150 MG capsule Take 150 mg by mouth every morning.     . rosuvastatin (CRESTOR) 10 MG tablet Take 10 mg by mouth daily.    Marland Kitchen warfarin (COUMADIN) 2.5 MG tablet Take 1 tablet (2.5 mg total) by mouth every other day. Recheck INR on Monday 9/11 and if INR <3 then resume Coumadin 2.5mg  every other day for goal INR 2-3range    . XOPENEX HFA 45 MCG/ACT inhaler Inhale 2 puffs into the lungs daily as needed for shortness of breath or wheezing.     No current facility-administered medications for this visit.  Allergies:   Patient has no known allergies.   Social History: Social History   Socioeconomic History  . Marital status: Divorced    Spouse name: Not on file  . Number of children: Not on file  . Years of education: Not on file  . Highest education level: Not on file  Occupational History  . Not on file  Social Needs  . Financial resource strain: Not on file  . Food insecurity:    Worry: Not on file    Inability: Not on file  . Transportation needs:    Medical: Not on file    Non-medical: Not on file  Tobacco Use  . Smoking status: Never Smoker  . Smokeless tobacco: Never Used  Substance and Sexual Activity  . Alcohol use: No  . Drug use: No  . Sexual activity: Never    Lifestyle  . Physical activity:    Days per week: Not on file    Minutes per session: Not on file  . Stress: Not on file  Relationships  . Social connections:    Talks on phone: Not on file    Gets together: Not on file    Attends religious service: Not on file    Active member of club or organization: Not on file    Attends meetings of clubs or organizations: Not on file    Relationship status: Not on file  . Intimate partner violence:    Fear of current or ex partner: Not on file    Emotionally abused: Not on file    Physically abused: Not on file    Forced sexual activity: Not on file  Other Topics Concern  . Not on file  Social History Narrative   Lives in [place]. 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 Denies TED.    Family History: Family History  Problem Relation Age of Onset  . Tuberculosis Mother   . Diabetes Father      Review of Systems: All other systems reviewed and are otherwise negative except as noted above.   Physical Exam: VS:  BP (!) 114/58   Pulse 89   Ht 5\' 2"  (1.575 m)   Wt 176 lb (79.8 kg)   SpO2 97%   BMI 32.19 kg/m  , BMI Body mass index is 32.19 kg/m.  GEN- The patient is elderly appearing, alert and oriented x 3 today.   HEENT: normocephalic, atraumatic; sclera clear, conjunctiva pink; hearing intact; oropharynx clear; neck supple  Lungs- Clear to ausculation bilaterally, normal work of breathing.  No wheezes, rales, rhonchi Heart- Regular rate and rhythm   GI- soft, non-tender, non-distended, bowel sounds present  Extremities- no clubbing, cyanosis, or edema  MS- no significant deformity or atrophy Skin- warm and dry, no rash or lesion; PPM pocket well healed Psych- euthymic mood, full affect Neuro- strength and sensation are intact  PPM Interrogation- reviewed in detail today,  See PACEART report  EKG:  EKG is not ordered today  Recent Labs: 10/30/2016: ALT 21 11/01/2016: TSH 1.588 11/03/2016: BUN  30; Creatinine, Ser 1.94; Hemoglobin 9.1; Platelets 171; Potassium 4.5; Sodium 139   Wt Readings from Last 3 Encounters:  05/29/17 176 lb (79.8 kg)  12/08/16 176 lb 6.4 oz (80 kg)  11/03/16 189 lb 9.5 oz (86 kg)     Other studies Reviewed: Additional studies/ records that were reviewed today include: Dr Jackalyn Lombard office notes  Assessment and Plan:  1.  Symptomatic bradycardia Normal PPM function See  Pace Art report No changes today  2.  Paroxysmal atrial fibrillation No recurrence by device interrogation today Continue Warfarin for CHADS2VASC of 6 She was having [facility] come out to draw labs.  I have asked her to follow up with PCP to be sure they are receiving INR's  3.  HTN Stable No change required today    Current medicines are reviewed at length with the patient today.   The patient does not have concerns regarding her medicines.  The following changes were made today:  none  Labs/ tests ordered today include: none Orders Placed This Encounter  Procedures  . CUP PACEART INCLINIC DEVICE CHECK     Disposition:   Follow up with [facility], me in 6 months   Signed, Chanetta Marshall, NP 05/31/2017 7:52 AM  [place] [address] [phone] (office) [phone] (fax)

## 2017-05-25 DIAGNOSIS — I10 Essential (primary) hypertension: Secondary | ICD-10-CM | POA: Diagnosis not present

## 2017-05-25 DIAGNOSIS — K5790 Diverticulosis of intestine, part unspecified, without perforation or abscess without bleeding: Secondary | ICD-10-CM | POA: Diagnosis not present

## 2017-05-25 DIAGNOSIS — K219 Gastro-esophageal reflux disease without esophagitis: Secondary | ICD-10-CM | POA: Diagnosis not present

## 2017-05-25 DIAGNOSIS — M6281 Muscle weakness (generalized): Secondary | ICD-10-CM | POA: Diagnosis not present

## 2017-05-25 DIAGNOSIS — E119 Type 2 diabetes mellitus without complications: Secondary | ICD-10-CM | POA: Diagnosis not present

## 2017-05-25 DIAGNOSIS — I480 Paroxysmal atrial fibrillation: Secondary | ICD-10-CM | POA: Diagnosis not present

## 2017-05-29 ENCOUNTER — Encounter: Payer: Self-pay | Admitting: Nurse Practitioner

## 2017-05-29 ENCOUNTER — Ambulatory Visit (INDEPENDENT_AMBULATORY_CARE_PROVIDER_SITE_OTHER): Payer: Medicare Other | Admitting: Nurse Practitioner

## 2017-05-29 VITALS — BP 114/58 | HR 89 | Ht 62.0 in | Wt 176.0 lb

## 2017-05-29 DIAGNOSIS — I480 Paroxysmal atrial fibrillation: Secondary | ICD-10-CM

## 2017-05-29 DIAGNOSIS — I10 Essential (primary) hypertension: Secondary | ICD-10-CM

## 2017-05-29 DIAGNOSIS — I442 Atrioventricular block, complete: Secondary | ICD-10-CM

## 2017-05-29 LAB — CUP PACEART INCLINIC DEVICE CHECK
Date Time Interrogation Session: 20190402143210
Implantable Lead Implant Date: 19990907
Implantable Lead Implant Date: 19990907
Implantable Lead Location: 753859
Implantable Lead Location: 753860
Implantable Lead Serial Number: 32904
Implantable Lead Serial Number: 33421
Implantable Pulse Generator Implant Date: 20140807

## 2017-05-29 NOTE — Patient Instructions (Addendum)
Medication Instructions:  Your physician recommends that you continue on your current medications as directed. Please refer to the Current Medication list given to you today.    If you need a refill on your cardiac medications before your next appointment, please call your pharmacy.  Labwork: NONE ORDERED  TODAY    Testing/Procedures:  NONE ORDERED  TODAY    Follow-Up:  Your physician wants you to follow-up in:  [place] will receive a reminder letter in the mail two months in advance. If you don't receive a letter, please call our office to schedule the follow-up appointment.   Remote monitoring is used to monitor your Pacemaker of ICD from home. This monitoring reduces the number of office visits required to check your device to one time per year. It allows [place] to keep an eye on the functioning of your device to ensure it is working properly. You are scheduled for a device check from home on .  08-28-17  You may send your transmission at any time that day. If you have a wireless device, the transmission will be sent automatically. After your physician reviews your transmission, you will receive a postcard with your next transmission date.     Any Other Special Instructions Will Be Listed Below (If Applicable).

## 2017-06-01 ENCOUNTER — Encounter: Payer: Medicare Other | Admitting: Nurse Practitioner

## 2017-06-22 DIAGNOSIS — Z7901 Long term (current) use of anticoagulants: Secondary | ICD-10-CM | POA: Diagnosis not present

## 2017-06-29 DIAGNOSIS — Z7901 Long term (current) use of anticoagulants: Secondary | ICD-10-CM | POA: Diagnosis not present

## 2017-06-29 DIAGNOSIS — I482 Chronic atrial fibrillation: Secondary | ICD-10-CM | POA: Diagnosis not present

## 2017-07-04 DIAGNOSIS — N184 Chronic kidney disease, stage 4 (severe): Secondary | ICD-10-CM | POA: Diagnosis not present

## 2017-07-04 DIAGNOSIS — D631 Anemia in chronic kidney disease: Secondary | ICD-10-CM | POA: Diagnosis not present

## 2017-07-04 DIAGNOSIS — I129 Hypertensive chronic kidney disease with stage 1 through stage 4 chronic kidney disease, or unspecified chronic kidney disease: Secondary | ICD-10-CM | POA: Diagnosis not present

## 2017-07-04 DIAGNOSIS — N390 Urinary tract infection, site not specified: Secondary | ICD-10-CM | POA: Diagnosis not present

## 2017-07-06 ENCOUNTER — Other Ambulatory Visit: Payer: Self-pay | Admitting: Nephrology

## 2017-07-06 DIAGNOSIS — I129 Hypertensive chronic kidney disease with stage 1 through stage 4 chronic kidney disease, or unspecified chronic kidney disease: Secondary | ICD-10-CM

## 2017-07-06 DIAGNOSIS — N184 Chronic kidney disease, stage 4 (severe): Secondary | ICD-10-CM

## 2017-07-13 ENCOUNTER — Ambulatory Visit
Admission: RE | Admit: 2017-07-13 | Discharge: 2017-07-13 | Disposition: A | Discharge status: Home / Self Care | Payer: Medicare Other | Source: Ambulatory Visit | Attending: Nephrology | Admitting: Nephrology

## 2017-07-13 DIAGNOSIS — I129 Hypertensive chronic kidney disease with stage 1 through stage 4 chronic kidney disease, or unspecified chronic kidney disease: Secondary | ICD-10-CM

## 2017-07-13 DIAGNOSIS — N184 Chronic kidney disease, stage 4 (severe): Secondary | ICD-10-CM | POA: Diagnosis not present

## 2017-07-27 DIAGNOSIS — Z7901 Long term (current) use of anticoagulants: Secondary | ICD-10-CM | POA: Diagnosis not present

## 2017-07-27 DIAGNOSIS — I482 Chronic atrial fibrillation: Secondary | ICD-10-CM | POA: Diagnosis not present

## 2017-08-10 DIAGNOSIS — N3281 Overactive bladder: Secondary | ICD-10-CM | POA: Diagnosis not present

## 2017-08-10 DIAGNOSIS — I10 Essential (primary) hypertension: Secondary | ICD-10-CM | POA: Diagnosis not present

## 2017-08-10 DIAGNOSIS — Z7901 Long term (current) use of anticoagulants: Secondary | ICD-10-CM | POA: Diagnosis not present

## 2017-08-10 DIAGNOSIS — E1122 Type 2 diabetes mellitus with diabetic chronic kidney disease: Secondary | ICD-10-CM | POA: Diagnosis not present

## 2017-08-10 DIAGNOSIS — I482 Chronic atrial fibrillation: Secondary | ICD-10-CM | POA: Diagnosis not present

## 2017-08-10 DIAGNOSIS — N183 Chronic kidney disease, stage 3 (moderate): Secondary | ICD-10-CM | POA: Diagnosis not present

## 2017-08-10 DIAGNOSIS — E039 Hypothyroidism, unspecified: Secondary | ICD-10-CM | POA: Diagnosis not present

## 2017-08-13 ENCOUNTER — Telehealth: Payer: Self-pay | Admitting: Internal Medicine

## 2017-08-13 NOTE — Telephone Encounter (Signed)
Pt is requesting to pass this information along to both Dr Rayann Heman and Chanetta Marshall NP, and covering RN. Pt states her PCP Dr Inda Castle d/c'ed her metoprolol last week, due to hypotension.  Pt states she is asymptomatic and feels much better, with much more energy. Pt states she is staying well hydrated as well.  Pt wanted to run this by her EP Providers, incase they wanted to change her regimen around, due to discontinued metoprolol.  Informed the pt that Dr Rayann Heman and his RN are both out of the office today, but I will route this information to them for further review, recommendation as needed, and follow-up with the pt as needed.  Pt verbalized understanding and agrees with this plan.

## 2017-08-13 NOTE — Telephone Encounter (Signed)
Pt calling to let us know her PCP took her off Metoprolol on Friday due to BP beigg too low-FYI

## 2017-08-14 NOTE — Telephone Encounter (Signed)
Notified Pt Dr. Rayann Heman aware of change and approves. No changes. Pt indicates understanding.

## 2017-08-14 NOTE — Telephone Encounter (Signed)
I think the change was appropriate. No additional recs at this time.

## 2017-08-16 ENCOUNTER — Emergency Department (HOSPITAL_COMMUNITY)
Admission: EM | Admit: 2017-08-16 | Discharge: 2017-08-16 | Disposition: A | Discharge status: Home / Self Care | Payer: Medicare Other | Attending: Emergency Medicine | Admitting: Emergency Medicine

## 2017-08-16 ENCOUNTER — Encounter (HOSPITAL_COMMUNITY): Payer: Self-pay

## 2017-08-16 ENCOUNTER — Emergency Department (HOSPITAL_COMMUNITY): Payer: Medicare Other

## 2017-08-16 ENCOUNTER — Other Ambulatory Visit: Payer: Self-pay

## 2017-08-16 DIAGNOSIS — S0001XA Abrasion of scalp, initial encounter: Secondary | ICD-10-CM | POA: Diagnosis not present

## 2017-08-16 DIAGNOSIS — Z23 Encounter for immunization: Secondary | ICD-10-CM | POA: Insufficient documentation

## 2017-08-16 DIAGNOSIS — S199XXA Unspecified injury of neck, initial encounter: Secondary | ICD-10-CM | POA: Diagnosis not present

## 2017-08-16 DIAGNOSIS — E119 Type 2 diabetes mellitus without complications: Secondary | ICD-10-CM | POA: Diagnosis not present

## 2017-08-16 DIAGNOSIS — E039 Hypothyroidism, unspecified: Secondary | ICD-10-CM | POA: Insufficient documentation

## 2017-08-16 DIAGNOSIS — Z7901 Long term (current) use of anticoagulants: Secondary | ICD-10-CM | POA: Insufficient documentation

## 2017-08-16 DIAGNOSIS — I10 Essential (primary) hypertension: Secondary | ICD-10-CM | POA: Diagnosis not present

## 2017-08-16 DIAGNOSIS — Z79899 Other long term (current) drug therapy: Secondary | ICD-10-CM | POA: Diagnosis not present

## 2017-08-16 DIAGNOSIS — S0990XA Unspecified injury of head, initial encounter: Secondary | ICD-10-CM | POA: Diagnosis not present

## 2017-08-16 DIAGNOSIS — S0003XA Contusion of scalp, initial encounter: Secondary | ICD-10-CM | POA: Diagnosis not present

## 2017-08-16 DIAGNOSIS — R51 Headache: Secondary | ICD-10-CM | POA: Insufficient documentation

## 2017-08-16 DIAGNOSIS — W010XXA Fall on same level from slipping, tripping and stumbling without subsequent striking against object, initial encounter: Secondary | ICD-10-CM

## 2017-08-16 DIAGNOSIS — R58 Hemorrhage, not elsewhere classified: Secondary | ICD-10-CM | POA: Diagnosis not present

## 2017-08-16 DIAGNOSIS — I959 Hypotension, unspecified: Secondary | ICD-10-CM | POA: Diagnosis not present

## 2017-08-16 MED ORDER — BACITRACIN ZINC 500 UNIT/GM EX OINT
TOPICAL_OINTMENT | Freq: Once | CUTANEOUS | Status: AC
  Administered 2017-08-16: 10:00:00 via TOPICAL
  Filled 2017-08-16: qty 0.9

## 2017-08-16 MED ORDER — ACETAMINOPHEN 325 MG PO TABS
650.0000 mg | ORAL_TABLET | Freq: Once | ORAL | Status: AC
  Administered 2017-08-16: 650 mg via ORAL
  Filled 2017-08-16: qty 2

## 2017-08-16 MED ORDER — TETANUS-DIPHTH-ACELL PERTUSSIS 5-2.5-18.5 LF-MCG/0.5 IM SUSP
0.5000 mL | Freq: Once | INTRAMUSCULAR | Status: AC
  Administered 2017-08-16: 0.5 mL via INTRAMUSCULAR
  Filled 2017-08-16: qty 0.5

## 2017-08-16 NOTE — ED Provider Notes (Signed)
[facility] EMERGENCY DEPARTMENT Provider Note   CSN: [id number] Arrival date & time: 08/16/17  0725     History   Chief Complaint Chief Complaint  Patient presents with  . Fall    HPI Tiffany [name] is a 84 y.o. female.  Patient c/o fall at home this AM. States tripped, fell forward, hit head. Dull headache post fall. Moderate, persistent, non radiating, worse w palpation. Is on coumadin. Felt fine prior to fall, no faintness or dizziness. No loc w fall. No vomiting. Denies neck or back pain. No chest pain or sob. No abd pain. Denies extremity pain or injury. Abrasion to forehead, last tetanus unknown.   The history is provided by the patient.  Fall  Pertinent negatives include no chest pain, no abdominal pain, no headaches and no shortness of breath.    Past Medical History:  Diagnosis Date  . Diverticulosis   . DM2 (diabetes mellitus, type 2) ([place])   . Esophageal reflux   . HTN (hypertension)   . Hyperlipidemia   . Hypothyroidism   . Microalbuminuria    last done in 1/07   . Paroxysmal atrial fibrillation (HCC)    and atrial tachycardia  . Stroke ([place]) 1999  . Tachycardia-bradycardia syndrome ([place])    s/p PPM  . Unspecified hypothyroidism 11/20/2013    Patient Active Problem List   Diagnosis Date Noted  . Acute renal failure (ARF) ([place]) 10/30/2016  . Acute renal failure syndrome ([place])   . Dehydration   . ARF (acute renal failure) ([place]) 11/19/2014  . Acute gastroenteritis 11/19/2014  . UTI (urinary tract infection) 11/22/2013  . Hypothyroidism 11/20/2013  . Acute blood loss anemia 11/20/2013  . Hematochezia 11/18/2013  . Warfarin-induced coagulopathy ([place]) 11/18/2013  . Pacemaker-Medtronic 01/04/2012  . Long term (current) use of anticoagulants 10/25/2011  . Tachycardia-bradycardia syndrome ([place]) 01/04/2011  . Atrial fibrillation ([place]) 01/05/2010  . Hyperlipidemia 07/28/2006  . Essential hypertension 07/28/2006  . CARDIOMYOPATHY  07/28/2006  . DM type 2 (diabetes mellitus, type 2) ([place]) 05/16/2006  . C V A/STROKE 05/16/2006  . HEMORRHOIDS, WITH BLEEDING 05/16/2006    Past Surgical History:  Procedure Laterality Date  . CHOLECYSTECTOMY    . COLONOSCOPY N/A 11/21/2013   Procedure: COLONOSCOPY;  Surgeon: Jeryl Columbia, MD;  Location: [facility] ENDOSCOPY;  Service: Endoscopy;  Laterality: N/A;  . HEMORRHOID SURGERY    . hysterectomy (other)    . PACEMAKER GENERATOR CHANGE  10/03/12   DMT Advisa DR gen change by Dr Rayann Heman  . PACEMAKER GENERATOR CHANGE N/A 10/03/2012   Procedure: PACEMAKER GENERATOR CHANGE;  Surgeon: Thompson Grayer, MD;  Location: [facility] CATH LAB;  Service: Cardiovascular;  Laterality: N/A;  . permanent pacemaker  1999   updated 2006 (MDT) by Dr Verlon Setting; gen change 09-2012 by Dr Rayann Heman (MDT)     OB History   None      Home Medications    Prior to Admission medications   Medication Sig Start Date End Date Taking? Authorizing Provider  diltiazem (DILACOR XR) 180 MG 24 hr capsule Take 180 mg by mouth daily.    [provider]  FERGON 240 (27 FE) MG tablet Take 1 tablet by mouth every evening.  10/31/14   [provider]  furosemide (LASIX) 20 MG tablet Take 20 mg by mouth daily.    [provider]  glipiZIDE (GLUCOTROL) 10 MG tablet Take 5 mg by mouth daily.     [provider]  levothyroxine (SYNTHROID, LEVOTHROID) 25 MCG  tablet Take 25 mcg by mouth daily before breakfast.    [provider]  metFORMIN (GLUCOPHAGE) 500 MG tablet Take 1 tablet by mouth 2 (two) times daily. 11/27/16   [provider]  phenol (CHLORASEPTIC) 1.4 % LIQD Use as directed 1 spray in the mouth or throat 3 (three) times daily as needed for throat irritation / pain.    [provider]  potassium chloride SA (K-DUR,KLOR-CON) 20 MEQ tablet Take 20 mEq by mouth daily.    [provider]  ranitidine (ZANTAC) 150 MG capsule Take 150 mg by mouth every morning.     [provider]  rosuvastatin (CRESTOR) 10 MG tablet Take 10 mg by mouth daily.    [provider]  warfarin (COUMADIN) 2.5 MG tablet Take 1 tablet (2.5 mg total) by mouth every other day. Recheck INR on Monday 9/11 and if INR <3 then resume Coumadin 2.5mg  every other day for goal INR 2-3range 11/06/16   Domenic Polite, MD  [facility] HFA 45 MCG/ACT inhaler Inhale 2 puffs into the lungs daily as needed for shortness of breath or wheezing. 09/10/15   [provider]    Family History Family History  Problem Relation Age of Onset  . Tuberculosis Mother   . Diabetes Father     Social History Social History   Tobacco Use  . Smoking status: Never Smoker  . Smokeless tobacco: Never Used  Substance Use Topics  . Alcohol use: No  . Drug use: No     Allergies   Patient has no known allergies.   Review of Systems Review of Systems  Constitutional: Negative for fever.  HENT: Negative for nosebleeds.   Eyes: Negative for pain.  Respiratory: Negative for shortness of breath.   Cardiovascular: Negative for chest pain.  Gastrointestinal: Negative for abdominal pain and vomiting.  Genitourinary: Negative for flank pain.  Musculoskeletal: Negative for back pain and neck pain.  Skin: Positive for wound.  Neurological: Negative for headaches.  Hematological: Does not bruise/bleed easily.  Psychiatric/Behavioral: Negative for confusion.     Physical Exam Updated Vital Signs BP (!) 142/50 (BP Location: Right Arm)   Pulse 64   Temp (!) 97.4 F (36.3 C) (Oral)   Resp (!) 21   Ht 1.6 m (5\' 3" )   Wt 80.3 kg (177 lb)   SpO2 94%   BMI 31.35 kg/m   Physical Exam  Constitutional: She appears well-developed and well-nourished.  HENT:  Contusion/abrasion to frontal scalp.   Eyes: Pupils are equal, round, and reactive to light. Conjunctivae are normal. No scleral icterus.  Neck: Neck supple. No tracheal deviation present.  Cardiovascular: Normal rate, regular rhythm,  normal heart sounds and intact distal pulses.  Pulmonary/Chest: Effort normal and breath sounds normal. No respiratory distress. She exhibits no tenderness.  Abdominal: Soft. Normal appearance and bowel sounds are normal. She exhibits no distension. There is no tenderness.  No abd bruising or contusion.   Genitourinary:  Genitourinary Comments: No cva tenderness  Musculoskeletal: She exhibits no edema.  Mid cervical tenderness, otherwise, CTLS spine, non tender, aligned, no step off. Good rom bil extremities without pain or focal bony tenderness.   Neurological: She is alert.  Speech normal. Motor intact bil, stre 5/5. sens grossly intact.   Skin: Skin is warm and dry. No rash noted.  Psychiatric: She has a normal mood and affect.  Nursing note and vitals reviewed.    ED Treatments / Results  Labs (all labs ordered are listed,  but only abnormal results are displayed) Labs Reviewed - No data to display  EKG None  Radiology Ct Head Wo Contrast  Result Date: 08/16/2017 CLINICAL DATA:  Cervical spine trauma. EXAM: CT HEAD WITHOUT CONTRAST CT CERVICAL SPINE WITHOUT CONTRAST TECHNIQUE: Multidetector CT imaging of the head and cervical spine was performed following the standard protocol without intravenous contrast. Multiplanar CT image reconstructions of the cervical spine were also generated. COMPARISON:  None. FINDINGS: CT HEAD FINDINGS Brain: Mild diffuse cortical atrophy is noted. Right frontal encephalomalacia is noted consistent with old infarction. Mild chronic ischemic white matter disease is noted. No mass effect or midline shift is noted. Ventricular size is within normal limits. There is no evidence of mass lesion, hemorrhage or acute infarction. Vascular: No hyperdense vessel or unexpected calcification. Skull: Normal. Negative for fracture or focal lesion. Sinuses/Orbits: No acute finding. Other: None. CT CERVICAL SPINE FINDINGS Alignment: Normal. Skull base and vertebrae: No acute  fracture. No primary bone lesion or focal pathologic process. Soft tissues and spinal canal: No prevertebral fluid or swelling. No visible canal hematoma. Disc levels: Mild degenerative disc disease is noted at C5-6 with anterior and posterior osteophyte formation. Upper chest: Negative. Other: Degenerative changes are seen involving posterior facet joints bilaterally. IMPRESSION: Mild diffuse cortical atrophy. Mild chronic ischemic white matter disease. Old right frontal infarction. No acute intracranial abnormality seen. Mild degenerative disc disease is noted at C5-6. No acute abnormality seen in the cervical spine. Electronically Signed   By: Marijo Conception, M.D.   On: 08/16/2017 08:50   Ct Cervical Spine Wo Contrast  Result Date: 08/16/2017 CLINICAL DATA:  Cervical spine trauma. EXAM: CT HEAD WITHOUT CONTRAST CT CERVICAL SPINE WITHOUT CONTRAST TECHNIQUE: Multidetector CT imaging of the head and cervical spine was performed following the standard protocol without intravenous contrast. Multiplanar CT image reconstructions of the cervical spine were also generated. COMPARISON:  None. FINDINGS: CT HEAD FINDINGS Brain: Mild diffuse cortical atrophy is noted. Right frontal encephalomalacia is noted consistent with old infarction. Mild chronic ischemic white matter disease is noted. No mass effect or midline shift is noted. Ventricular size is within normal limits. There is no evidence of mass lesion, hemorrhage or acute infarction. Vascular: No hyperdense vessel or unexpected calcification. Skull: Normal. Negative for fracture or focal lesion. Sinuses/Orbits: No acute finding. Other: None. CT CERVICAL SPINE FINDINGS Alignment: Normal. Skull base and vertebrae: No acute fracture. No primary bone lesion or focal pathologic process. Soft tissues and spinal canal: No prevertebral fluid or swelling. No visible canal hematoma. Disc levels: Mild degenerative disc disease is noted at C5-6 with anterior and posterior  osteophyte formation. Upper chest: Negative. Other: Degenerative changes are seen involving posterior facet joints bilaterally. IMPRESSION: Mild diffuse cortical atrophy. Mild chronic ischemic white matter disease. Old right frontal infarction. No acute intracranial abnormality seen. Mild degenerative disc disease is noted at C5-6. No acute abnormality seen in the cervical spine. Electronically Signed   By: Marijo Conception, M.D.   On: 08/16/2017 08:50    Procedures Procedures (including critical care time)  Medications Ordered in ED Medications  Tdap (BOOSTRIX) injection 0.5 mL (has no administration in time range)  bacitracin ointment (has no administration in time range)     Initial Impression / Assessment and Plan / ED Course  I have reviewed the triage vital signs and the nursing notes.  Pertinent labs & imaging results that were available during my care of the patient were reviewed by me and considered in my medical  decision making (see chart for details).  Imaging studies ordered.  Abrasions cleaned, bacitracin/dressing applied.   Reviewed nursing notes and prior charts for additional history.   Acetaminophen po.  Recheck, no new c/o or new pain. Pt alert. Mental status c/w baseline.  Po fluids, snack.  Ambulated in hall.  Pt currenty appears stable for d/c.      Final Clinical Impressions(s) / ED Diagnoses   Final diagnoses:  None    ED Discharge Orders    None       Lajean Saver, MD 08/16/17 [phone]

## 2017-08-16 NOTE — ED Notes (Signed)
Pt forehead and abrasion cleaned and bacitracin applied.

## 2017-08-16 NOTE — Discharge Instructions (Addendum)
It was our pleasure to provide your ER care today - we hope that you feel better.  Use great care/caution to avoid falling.  Keep abrasion area very clean. You may apply a thin coat of bacitracin to area for the next few days.  Take acetaminophen as need.   Follow up with primary care doctor in 1 week.  Return to ER if worse, new symptoms, weak/fainting, fevers, frequent/recurrent falls, new or severe pain, other concern.

## 2017-08-16 NOTE — ED Triage Notes (Signed)
Pt brought in by EMS due to having a fall while taking the trash out. Per pt, she tripped over the mat. Pt denies dizziness, LOC, or back pain. Pt does take coumadin. Pt has abrasion to top of head. Pt a&ox4.

## 2017-08-16 NOTE — ED Notes (Signed)
Pt ambulated in hallway with walker to bathroom without difficulty.

## 2017-08-18 ENCOUNTER — Encounter (HOSPITAL_COMMUNITY): Payer: Self-pay | Admitting: Emergency Medicine

## 2017-08-18 ENCOUNTER — Emergency Department (HOSPITAL_COMMUNITY)
Admission: EM | Admit: 2017-08-18 | Discharge: 2017-08-18 | Disposition: A | Discharge status: Home / Self Care | Payer: Medicare Other | Attending: Emergency Medicine | Admitting: Emergency Medicine

## 2017-08-18 ENCOUNTER — Other Ambulatory Visit: Payer: Self-pay

## 2017-08-18 DIAGNOSIS — S0512XA Contusion of eyeball and orbital tissues, left eye, initial encounter: Secondary | ICD-10-CM | POA: Diagnosis not present

## 2017-08-18 DIAGNOSIS — Y929 Unspecified place or not applicable: Secondary | ICD-10-CM | POA: Diagnosis not present

## 2017-08-18 DIAGNOSIS — Z7984 Long term (current) use of oral hypoglycemic drugs: Secondary | ICD-10-CM | POA: Diagnosis not present

## 2017-08-18 DIAGNOSIS — Y939 Activity, unspecified: Secondary | ICD-10-CM | POA: Diagnosis not present

## 2017-08-18 DIAGNOSIS — Z950 Presence of cardiac pacemaker: Secondary | ICD-10-CM | POA: Insufficient documentation

## 2017-08-18 DIAGNOSIS — E039 Hypothyroidism, unspecified: Secondary | ICD-10-CM | POA: Diagnosis not present

## 2017-08-18 DIAGNOSIS — S0511XA Contusion of eyeball and orbital tissues, right eye, initial encounter: Secondary | ICD-10-CM | POA: Diagnosis not present

## 2017-08-18 DIAGNOSIS — Z79899 Other long term (current) drug therapy: Secondary | ICD-10-CM | POA: Insufficient documentation

## 2017-08-18 DIAGNOSIS — E119 Type 2 diabetes mellitus without complications: Secondary | ICD-10-CM | POA: Diagnosis not present

## 2017-08-18 DIAGNOSIS — I10 Essential (primary) hypertension: Secondary | ICD-10-CM | POA: Diagnosis not present

## 2017-08-18 DIAGNOSIS — R220 Localized swelling, mass and lump, head: Secondary | ICD-10-CM

## 2017-08-18 DIAGNOSIS — W19XXXA Unspecified fall, initial encounter: Secondary | ICD-10-CM | POA: Insufficient documentation

## 2017-08-18 DIAGNOSIS — Y999 Unspecified external cause status: Secondary | ICD-10-CM | POA: Diagnosis not present

## 2017-08-18 DIAGNOSIS — S0993XA Unspecified injury of face, initial encounter: Secondary | ICD-10-CM | POA: Diagnosis present

## 2017-08-18 DIAGNOSIS — Z7901 Long term (current) use of anticoagulants: Secondary | ICD-10-CM | POA: Insufficient documentation

## 2017-08-18 NOTE — Discharge Instructions (Addendum)
Your swelling is likely from your recent wound.  Ice may help keep it down.  Watch for increased size of the wound on her head.  Keep the wound in your head clean.

## 2017-08-18 NOTE — ED Triage Notes (Signed)
Pt presents with left upper and lower eyelid edema that she reports began last night. Denies any new products used. Had recent fall Thursday that she was seen for and also wanted to have scalp staples looked at.

## 2017-08-18 NOTE — ED Provider Notes (Signed)
[place] EMERGENCY DEPARTMENT Provider Note   CSN: [id number] Arrival date & time: 08/18/17  2841     History   Chief Complaint Chief Complaint  Patient presents with  . Facial Swelling    HPI Tiffany [name] is a 84 y.o. female.  HPI Patient with facial swelling and edema.  Had fall 2 days ago and she is on Coumadin.  Negative head CT at that time.  Has had some swelling now some mild ecchymosis around her eyes.  Otherwise feels good.  No headache.  No difficulty seeing.  She is on Coumadin. Past Medical History:  Diagnosis Date  . Diverticulosis   . DM2 (diabetes mellitus, type 2) ([place])   . Esophageal reflux   . HTN (hypertension)   . Hyperlipidemia   . Hypothyroidism   . Microalbuminuria    last done in 1/07   . Paroxysmal atrial fibrillation (HCC)    and atrial tachycardia  . Stroke ([place]) 1999  . Tachycardia-bradycardia syndrome ([place])    s/p PPM  . Unspecified hypothyroidism 11/20/2013    Patient Active Problem List   Diagnosis Date Noted  . Acute renal failure (ARF) ([place]) 10/30/2016  . Acute renal failure syndrome ([place])   . Dehydration   . ARF (acute renal failure) ([place]) 11/19/2014  . Acute gastroenteritis 11/19/2014  . UTI (urinary tract infection) 11/22/2013  . Hypothyroidism 11/20/2013  . Acute blood loss anemia 11/20/2013  . Hematochezia 11/18/2013  . Warfarin-induced coagulopathy ([place]) 11/18/2013  . Pacemaker-Medtronic 01/04/2012  . Long term (current) use of anticoagulants 10/25/2011  . Tachycardia-bradycardia syndrome ([place]) 01/04/2011  . Atrial fibrillation ([place]) 01/05/2010  . Hyperlipidemia 07/28/2006  . Essential hypertension 07/28/2006  . CARDIOMYOPATHY 07/28/2006  . DM type 2 (diabetes mellitus, type 2) ([place]) 05/16/2006  . C V A/STROKE 05/16/2006  . HEMORRHOIDS, WITH BLEEDING 05/16/2006    Past Surgical History:  Procedure Laterality Date  . CHOLECYSTECTOMY    . COLONOSCOPY N/A 11/21/2013   Procedure:  COLONOSCOPY;  Surgeon: Jeryl Columbia, MD;  [facility]COPY;  Service: Endoscopy;  Laterality: N/A;  . HEMORRHOID SURGERY    . hysterectomy (other)    . PACEMAKER GENERATOR CHANGE  10/03/12   DMT Advisa DR gen change by Dr Rayann Heman  . PACEMAKER GENERATOR CHANGE N/A 10/03/2012   Procedure: PACEMAKER GENERATOR CHANGE;  Surgeon: Thompson Grayer, MD;  Location: [facility] CATH LAB;  Service: Cardiovascular;  Laterality: N/A;  . permanent pacemaker  1999   updated 2006 (MDT) by Dr Verlon Setting; gen change 09-2012 by Dr Rayann Heman (MDT)     OB History   None      Home Medications    Prior to Admission medications   Medication Sig Start Date End Date Taking? Authorizing Provider  diltiazem (DILACOR XR) 180 MG 24 hr capsule Take 180 mg by mouth daily.    [provider]  FERGON 240 (27 FE) MG tablet Take 1 tablet by mouth every evening.  10/31/14   [provider]  furosemide (LASIX) 20 MG tablet Take 20 mg by mouth daily.    [provider]  glipiZIDE (GLUCOTROL) 10 MG tablet Take 10 mg by mouth 2 (two) times daily before a meal.     [provider]  ipratropium (ATROVENT) 0.03 % nasal spray Place 1 spray into both nostrils every 12 (twelve) hours.    [provider]  JANUVIA 25 MG tablet Take 25 mg by mouth daily at 12 noon. 08/06/17   [provider]  levothyroxine (SYNTHROID, LEVOTHROID) 50 MCG tablet Take 50 mcg by mouth daily before breakfast.     [provider]  MYRBETRIQ 25 MG TB24 tablet Take 25 tablets by mouth daily at 12 noon. 07/24/17   [provider]  ondansetron (ZOFRAN) 4 MG tablet Take 4 mg by mouth every 8 (eight) hours as needed for nausea or vomiting.    [provider]  phenol (CHLORASEPTIC) 1.4 % LIQD Use as directed 1 spray in the mouth or throat 3 (three) times daily as needed for throat irritation / pain.    [provider]  potassium chloride SA (K-DUR,KLOR-CON) 20 MEQ tablet Take 20 mEq by mouth daily.     [provider]  ranitidine (ZANTAC) 150 MG capsule Take 150 mg by mouth every morning.     [provider]  rosuvastatin (CRESTOR) 10 MG tablet Take 10 mg by mouth daily.    [provider]  Semaglutide (OZEMPIC) 0.25 or 0.5 MG/DOSE SOPN Inject 0.25 mg into the skin once a week.    [provider]  warfarin (COUMADIN) 2.5 MG tablet Take 1 tablet (2.5 mg total) by mouth every other day. Recheck INR on Monday 9/11 and if INR <3 then resume Coumadin 2.5mg  every other day for goal INR 2-3range Patient taking differently: Take 2.5 mg by mouth See admin instructions. Take 2.5 mg every day EXCEPT Sunday take 5 mg 11/06/16   Domenic Polite, MD  [facility] HFA 45 MCG/ACT inhaler Inhale 2 puffs into the lungs daily as needed for shortness of breath or wheezing. 09/10/15   [provider]    Family History Family History  Problem Relation Age of Onset  . Tuberculosis Mother   . Diabetes Father     Social History Social History   Tobacco Use  . Smoking status: Never Smoker  . Smokeless tobacco: Never Used  Substance Use Topics  . Alcohol use: No  . Drug use: No     Allergies   Patient has no known allergies.   Review of Systems Review of Systems  Constitutional: Negative for appetite change.  HENT: Negative for congestion.        Facial edema.  Respiratory: Negative for shortness of breath.   Musculoskeletal: Negative for back pain.  Skin: Positive for wound.  Neurological: Negative for headaches.     Physical Exam Updated Vital Signs BP (!) 147/52 (BP Location: Right Arm)   Pulse 72   Temp 98.4 F (36.9 C) (Oral)   Resp 15   SpO2 97%   Physical Exam  Constitutional: She is oriented to person, place, and time. She appears well-developed.  HENT:  Abrasion to scalp on frontal area.  Edema to periorbital area bilaterally with some bilateral bruising.  No underlying tenderness.  Eyes: Pupils are equal, round, and reactive to light.    Neck: Neck supple.  Neurological: She is alert and oriented to person, place, and time.  Skin: Skin is warm.     ED Treatments / Results  Labs (all labs ordered are listed, but only abnormal results are displayed) Labs Reviewed - No data to display  EKG None  Radiology No results found.  Procedures Procedures (including critical care time)  Medications Ordered in ED Medications - No data to display   Initial Impression / Assessment and Plan / ED Course  I have reviewed the triage vital signs and the nursing notes.  Pertinent labs & imaging results that were available during my care of the patient  were reviewed by me and considered in my medical decision making (see chart for details).     Patient with recent fall.  Now periorbital edema.  Likely coming from edema from recent fall.  Does not appear to need reimaging.  Reviewed previous imaging.  At mental baseline.  Discharge home.  Final Clinical Impressions(s) / ED Diagnoses   Final diagnoses:  Facial swelling    ED Discharge Orders    None       Davonna Belling, MD 08/18/17 1659

## 2017-08-23 DIAGNOSIS — Z7901 Long term (current) use of anticoagulants: Secondary | ICD-10-CM | POA: Diagnosis not present

## 2017-08-23 DIAGNOSIS — Z9181 History of falling: Secondary | ICD-10-CM | POA: Diagnosis not present

## 2017-08-23 DIAGNOSIS — Z09 Encounter for follow-up examination after completed treatment for conditions other than malignant neoplasm: Secondary | ICD-10-CM | POA: Diagnosis not present

## 2017-08-23 DIAGNOSIS — T148XXA Other injury of unspecified body region, initial encounter: Secondary | ICD-10-CM | POA: Diagnosis not present

## 2017-08-27 DIAGNOSIS — K5790 Diverticulosis of intestine, part unspecified, without perforation or abscess without bleeding: Secondary | ICD-10-CM | POA: Diagnosis not present

## 2017-08-27 DIAGNOSIS — I129 Hypertensive chronic kidney disease with stage 1 through stage 4 chronic kidney disease, or unspecified chronic kidney disease: Secondary | ICD-10-CM | POA: Diagnosis not present

## 2017-08-27 DIAGNOSIS — Z7901 Long term (current) use of anticoagulants: Secondary | ICD-10-CM | POA: Diagnosis not present

## 2017-08-27 DIAGNOSIS — N183 Chronic kidney disease, stage 3 (moderate): Secondary | ICD-10-CM | POA: Diagnosis not present

## 2017-08-27 DIAGNOSIS — Z7984 Long term (current) use of oral hypoglycemic drugs: Secondary | ICD-10-CM | POA: Diagnosis not present

## 2017-08-27 DIAGNOSIS — Z8673 Personal history of transient ischemic attack (TIA), and cerebral infarction without residual deficits: Secondary | ICD-10-CM | POA: Diagnosis not present

## 2017-08-27 DIAGNOSIS — E1122 Type 2 diabetes mellitus with diabetic chronic kidney disease: Secondary | ICD-10-CM | POA: Diagnosis not present

## 2017-08-27 DIAGNOSIS — S0083XD Contusion of other part of head, subsequent encounter: Secondary | ICD-10-CM | POA: Diagnosis not present

## 2017-08-27 DIAGNOSIS — E039 Hypothyroidism, unspecified: Secondary | ICD-10-CM | POA: Diagnosis not present

## 2017-08-27 DIAGNOSIS — E785 Hyperlipidemia, unspecified: Secondary | ICD-10-CM | POA: Diagnosis not present

## 2017-08-27 DIAGNOSIS — I482 Chronic atrial fibrillation: Secondary | ICD-10-CM | POA: Diagnosis not present

## 2017-08-27 DIAGNOSIS — W01190D Fall on same level from slipping, tripping and stumbling with subsequent striking against furniture, subsequent encounter: Secondary | ICD-10-CM | POA: Diagnosis not present

## 2017-08-28 ENCOUNTER — Ambulatory Visit (INDEPENDENT_AMBULATORY_CARE_PROVIDER_SITE_OTHER): Payer: Medicare Other | Admitting: *Deleted

## 2017-08-28 DIAGNOSIS — E1122 Type 2 diabetes mellitus with diabetic chronic kidney disease: Secondary | ICD-10-CM | POA: Diagnosis not present

## 2017-08-28 DIAGNOSIS — I442 Atrioventricular block, complete: Secondary | ICD-10-CM

## 2017-08-28 DIAGNOSIS — I482 Chronic atrial fibrillation: Secondary | ICD-10-CM | POA: Diagnosis not present

## 2017-08-28 DIAGNOSIS — I129 Hypertensive chronic kidney disease with stage 1 through stage 4 chronic kidney disease, or unspecified chronic kidney disease: Secondary | ICD-10-CM | POA: Diagnosis not present

## 2017-08-28 DIAGNOSIS — N183 Chronic kidney disease, stage 3 (moderate): Secondary | ICD-10-CM | POA: Diagnosis not present

## 2017-08-28 DIAGNOSIS — K5790 Diverticulosis of intestine, part unspecified, without perforation or abscess without bleeding: Secondary | ICD-10-CM | POA: Diagnosis not present

## 2017-08-28 DIAGNOSIS — S0083XD Contusion of other part of head, subsequent encounter: Secondary | ICD-10-CM | POA: Diagnosis not present

## 2017-08-29 ENCOUNTER — Encounter: Payer: Self-pay | Admitting: Cardiology

## 2017-08-29 NOTE — Progress Notes (Signed)
Remote pacemaker transmission.   

## 2017-08-31 DIAGNOSIS — K5790 Diverticulosis of intestine, part unspecified, without perforation or abscess without bleeding: Secondary | ICD-10-CM | POA: Diagnosis not present

## 2017-08-31 DIAGNOSIS — N183 Chronic kidney disease, stage 3 (moderate): Secondary | ICD-10-CM | POA: Diagnosis not present

## 2017-08-31 DIAGNOSIS — E1122 Type 2 diabetes mellitus with diabetic chronic kidney disease: Secondary | ICD-10-CM | POA: Diagnosis not present

## 2017-08-31 DIAGNOSIS — S0083XD Contusion of other part of head, subsequent encounter: Secondary | ICD-10-CM | POA: Diagnosis not present

## 2017-08-31 DIAGNOSIS — I129 Hypertensive chronic kidney disease with stage 1 through stage 4 chronic kidney disease, or unspecified chronic kidney disease: Secondary | ICD-10-CM | POA: Diagnosis not present

## 2017-08-31 DIAGNOSIS — I482 Chronic atrial fibrillation: Secondary | ICD-10-CM | POA: Diagnosis not present

## 2017-09-04 DIAGNOSIS — I482 Chronic atrial fibrillation: Secondary | ICD-10-CM | POA: Diagnosis not present

## 2017-09-04 DIAGNOSIS — K5790 Diverticulosis of intestine, part unspecified, without perforation or abscess without bleeding: Secondary | ICD-10-CM | POA: Diagnosis not present

## 2017-09-04 DIAGNOSIS — S0083XD Contusion of other part of head, subsequent encounter: Secondary | ICD-10-CM | POA: Diagnosis not present

## 2017-09-04 DIAGNOSIS — E1122 Type 2 diabetes mellitus with diabetic chronic kidney disease: Secondary | ICD-10-CM | POA: Diagnosis not present

## 2017-09-04 DIAGNOSIS — I129 Hypertensive chronic kidney disease with stage 1 through stage 4 chronic kidney disease, or unspecified chronic kidney disease: Secondary | ICD-10-CM | POA: Diagnosis not present

## 2017-09-04 DIAGNOSIS — N183 Chronic kidney disease, stage 3 (moderate): Secondary | ICD-10-CM | POA: Diagnosis not present

## 2017-09-05 DIAGNOSIS — S0083XD Contusion of other part of head, subsequent encounter: Secondary | ICD-10-CM | POA: Diagnosis not present

## 2017-09-05 DIAGNOSIS — I129 Hypertensive chronic kidney disease with stage 1 through stage 4 chronic kidney disease, or unspecified chronic kidney disease: Secondary | ICD-10-CM | POA: Diagnosis not present

## 2017-09-05 DIAGNOSIS — E1122 Type 2 diabetes mellitus with diabetic chronic kidney disease: Secondary | ICD-10-CM | POA: Diagnosis not present

## 2017-09-05 DIAGNOSIS — K5790 Diverticulosis of intestine, part unspecified, without perforation or abscess without bleeding: Secondary | ICD-10-CM | POA: Diagnosis not present

## 2017-09-05 DIAGNOSIS — I482 Chronic atrial fibrillation: Secondary | ICD-10-CM | POA: Diagnosis not present

## 2017-09-05 DIAGNOSIS — N183 Chronic kidney disease, stage 3 (moderate): Secondary | ICD-10-CM | POA: Diagnosis not present

## 2017-09-07 DIAGNOSIS — K5790 Diverticulosis of intestine, part unspecified, without perforation or abscess without bleeding: Secondary | ICD-10-CM | POA: Diagnosis not present

## 2017-09-07 DIAGNOSIS — N183 Chronic kidney disease, stage 3 (moderate): Secondary | ICD-10-CM | POA: Diagnosis not present

## 2017-09-07 DIAGNOSIS — I129 Hypertensive chronic kidney disease with stage 1 through stage 4 chronic kidney disease, or unspecified chronic kidney disease: Secondary | ICD-10-CM | POA: Diagnosis not present

## 2017-09-07 DIAGNOSIS — S0083XD Contusion of other part of head, subsequent encounter: Secondary | ICD-10-CM | POA: Diagnosis not present

## 2017-09-07 DIAGNOSIS — I482 Chronic atrial fibrillation: Secondary | ICD-10-CM | POA: Diagnosis not present

## 2017-09-07 DIAGNOSIS — E1122 Type 2 diabetes mellitus with diabetic chronic kidney disease: Secondary | ICD-10-CM | POA: Diagnosis not present

## 2017-09-11 DIAGNOSIS — K5790 Diverticulosis of intestine, part unspecified, without perforation or abscess without bleeding: Secondary | ICD-10-CM | POA: Diagnosis not present

## 2017-09-11 DIAGNOSIS — N183 Chronic kidney disease, stage 3 (moderate): Secondary | ICD-10-CM | POA: Diagnosis not present

## 2017-09-11 DIAGNOSIS — I482 Chronic atrial fibrillation: Secondary | ICD-10-CM | POA: Diagnosis not present

## 2017-09-11 DIAGNOSIS — E1122 Type 2 diabetes mellitus with diabetic chronic kidney disease: Secondary | ICD-10-CM | POA: Diagnosis not present

## 2017-09-11 DIAGNOSIS — I129 Hypertensive chronic kidney disease with stage 1 through stage 4 chronic kidney disease, or unspecified chronic kidney disease: Secondary | ICD-10-CM | POA: Diagnosis not present

## 2017-09-11 DIAGNOSIS — S0083XD Contusion of other part of head, subsequent encounter: Secondary | ICD-10-CM | POA: Diagnosis not present

## 2017-09-13 DIAGNOSIS — S0083XD Contusion of other part of head, subsequent encounter: Secondary | ICD-10-CM | POA: Diagnosis not present

## 2017-09-13 DIAGNOSIS — K5790 Diverticulosis of intestine, part unspecified, without perforation or abscess without bleeding: Secondary | ICD-10-CM | POA: Diagnosis not present

## 2017-09-13 DIAGNOSIS — I482 Chronic atrial fibrillation: Secondary | ICD-10-CM | POA: Diagnosis not present

## 2017-09-13 DIAGNOSIS — N183 Chronic kidney disease, stage 3 (moderate): Secondary | ICD-10-CM | POA: Diagnosis not present

## 2017-09-13 DIAGNOSIS — E1122 Type 2 diabetes mellitus with diabetic chronic kidney disease: Secondary | ICD-10-CM | POA: Diagnosis not present

## 2017-09-13 DIAGNOSIS — I129 Hypertensive chronic kidney disease with stage 1 through stage 4 chronic kidney disease, or unspecified chronic kidney disease: Secondary | ICD-10-CM | POA: Diagnosis not present

## 2017-09-15 LAB — CUP PACEART REMOTE DEVICE CHECK
Battery Remaining Longevity: 28 mo
Battery Voltage: 2.95 V
Brady Statistic AP VP Percent: 100 %
Brady Statistic AP VS Percent: 0 %
Brady Statistic AS VP Percent: 0 %
Brady Statistic AS VS Percent: 0 %
Brady Statistic RA Percent Paced: 100 %
Brady Statistic RV Percent Paced: 99.97 %
Date Time Interrogation Session: 20190702142035
Implantable Lead Implant Date: 19990907
Implantable Lead Implant Date: 19990907
Implantable Lead Location: 753859
Implantable Lead Location: 753860
Implantable Lead Serial Number: 32904
Implantable Lead Serial Number: 33421
Implantable Pulse Generator Implant Date: 20140807
Lead Channel Impedance Value: 266 Ohm
Lead Channel Impedance Value: 266 Ohm
Lead Channel Impedance Value: 285 Ohm
Lead Channel Impedance Value: 304 Ohm
Lead Channel Pacing Threshold Amplitude: 1 V
Lead Channel Pacing Threshold Amplitude: 1.125 V
Lead Channel Pacing Threshold Pulse Width: 0.4 ms
Lead Channel Pacing Threshold Pulse Width: 0.4 ms
Lead Channel Sensing Intrinsic Amplitude: 0.25 mV
Lead Channel Sensing Intrinsic Amplitude: 0.25 mV
Lead Channel Sensing Intrinsic Amplitude: 14.375 mV
Lead Channel Sensing Intrinsic Amplitude: 14.375 mV
Lead Channel Setting Pacing Amplitude: 2 V
Lead Channel Setting Pacing Amplitude: 2.5 V
Lead Channel Setting Pacing Pulse Width: 0.4 ms
Lead Channel Setting Sensing Sensitivity: 8 mV

## 2017-09-17 DIAGNOSIS — E1122 Type 2 diabetes mellitus with diabetic chronic kidney disease: Secondary | ICD-10-CM | POA: Diagnosis not present

## 2017-09-17 DIAGNOSIS — I482 Chronic atrial fibrillation: Secondary | ICD-10-CM | POA: Diagnosis not present

## 2017-09-17 DIAGNOSIS — K5790 Diverticulosis of intestine, part unspecified, without perforation or abscess without bleeding: Secondary | ICD-10-CM | POA: Diagnosis not present

## 2017-09-17 DIAGNOSIS — I129 Hypertensive chronic kidney disease with stage 1 through stage 4 chronic kidney disease, or unspecified chronic kidney disease: Secondary | ICD-10-CM | POA: Diagnosis not present

## 2017-09-17 DIAGNOSIS — S0083XD Contusion of other part of head, subsequent encounter: Secondary | ICD-10-CM | POA: Diagnosis not present

## 2017-09-17 DIAGNOSIS — N183 Chronic kidney disease, stage 3 (moderate): Secondary | ICD-10-CM | POA: Diagnosis not present

## 2017-09-18 DIAGNOSIS — E1122 Type 2 diabetes mellitus with diabetic chronic kidney disease: Secondary | ICD-10-CM | POA: Diagnosis not present

## 2017-09-18 DIAGNOSIS — I482 Chronic atrial fibrillation: Secondary | ICD-10-CM | POA: Diagnosis not present

## 2017-09-18 DIAGNOSIS — I129 Hypertensive chronic kidney disease with stage 1 through stage 4 chronic kidney disease, or unspecified chronic kidney disease: Secondary | ICD-10-CM | POA: Diagnosis not present

## 2017-09-18 DIAGNOSIS — N183 Chronic kidney disease, stage 3 (moderate): Secondary | ICD-10-CM | POA: Diagnosis not present

## 2017-09-18 DIAGNOSIS — S0083XD Contusion of other part of head, subsequent encounter: Secondary | ICD-10-CM | POA: Diagnosis not present

## 2017-09-18 DIAGNOSIS — K5790 Diverticulosis of intestine, part unspecified, without perforation or abscess without bleeding: Secondary | ICD-10-CM | POA: Diagnosis not present

## 2017-09-21 DIAGNOSIS — I482 Chronic atrial fibrillation: Secondary | ICD-10-CM | POA: Diagnosis not present

## 2017-09-21 DIAGNOSIS — K5790 Diverticulosis of intestine, part unspecified, without perforation or abscess without bleeding: Secondary | ICD-10-CM | POA: Diagnosis not present

## 2017-09-21 DIAGNOSIS — N183 Chronic kidney disease, stage 3 (moderate): Secondary | ICD-10-CM | POA: Diagnosis not present

## 2017-09-21 DIAGNOSIS — S0083XD Contusion of other part of head, subsequent encounter: Secondary | ICD-10-CM | POA: Diagnosis not present

## 2017-09-21 DIAGNOSIS — E1122 Type 2 diabetes mellitus with diabetic chronic kidney disease: Secondary | ICD-10-CM | POA: Diagnosis not present

## 2017-09-21 DIAGNOSIS — I129 Hypertensive chronic kidney disease with stage 1 through stage 4 chronic kidney disease, or unspecified chronic kidney disease: Secondary | ICD-10-CM | POA: Diagnosis not present

## 2017-09-25 DIAGNOSIS — K5790 Diverticulosis of intestine, part unspecified, without perforation or abscess without bleeding: Secondary | ICD-10-CM | POA: Diagnosis not present

## 2017-09-25 DIAGNOSIS — E1122 Type 2 diabetes mellitus with diabetic chronic kidney disease: Secondary | ICD-10-CM | POA: Diagnosis not present

## 2017-09-25 DIAGNOSIS — I482 Chronic atrial fibrillation: Secondary | ICD-10-CM | POA: Diagnosis not present

## 2017-09-25 DIAGNOSIS — N183 Chronic kidney disease, stage 3 (moderate): Secondary | ICD-10-CM | POA: Diagnosis not present

## 2017-09-25 DIAGNOSIS — S0083XD Contusion of other part of head, subsequent encounter: Secondary | ICD-10-CM | POA: Diagnosis not present

## 2017-09-25 DIAGNOSIS — I129 Hypertensive chronic kidney disease with stage 1 through stage 4 chronic kidney disease, or unspecified chronic kidney disease: Secondary | ICD-10-CM | POA: Diagnosis not present

## 2017-09-27 DIAGNOSIS — I482 Chronic atrial fibrillation: Secondary | ICD-10-CM | POA: Diagnosis not present

## 2017-09-27 DIAGNOSIS — K5790 Diverticulosis of intestine, part unspecified, without perforation or abscess without bleeding: Secondary | ICD-10-CM | POA: Diagnosis not present

## 2017-09-27 DIAGNOSIS — E1122 Type 2 diabetes mellitus with diabetic chronic kidney disease: Secondary | ICD-10-CM | POA: Diagnosis not present

## 2017-09-27 DIAGNOSIS — S0083XD Contusion of other part of head, subsequent encounter: Secondary | ICD-10-CM | POA: Diagnosis not present

## 2017-09-27 DIAGNOSIS — N183 Chronic kidney disease, stage 3 (moderate): Secondary | ICD-10-CM | POA: Diagnosis not present

## 2017-09-27 DIAGNOSIS — I129 Hypertensive chronic kidney disease with stage 1 through stage 4 chronic kidney disease, or unspecified chronic kidney disease: Secondary | ICD-10-CM | POA: Diagnosis not present

## 2017-09-28 DIAGNOSIS — E113393 Type 2 diabetes mellitus with moderate nonproliferative diabetic retinopathy without macular edema, bilateral: Secondary | ICD-10-CM | POA: Diagnosis not present

## 2017-10-02 DIAGNOSIS — I129 Hypertensive chronic kidney disease with stage 1 through stage 4 chronic kidney disease, or unspecified chronic kidney disease: Secondary | ICD-10-CM | POA: Diagnosis not present

## 2017-10-02 DIAGNOSIS — E1122 Type 2 diabetes mellitus with diabetic chronic kidney disease: Secondary | ICD-10-CM | POA: Diagnosis not present

## 2017-10-02 DIAGNOSIS — K5790 Diverticulosis of intestine, part unspecified, without perforation or abscess without bleeding: Secondary | ICD-10-CM | POA: Diagnosis not present

## 2017-10-02 DIAGNOSIS — I482 Chronic atrial fibrillation: Secondary | ICD-10-CM | POA: Diagnosis not present

## 2017-10-02 DIAGNOSIS — N183 Chronic kidney disease, stage 3 (moderate): Secondary | ICD-10-CM | POA: Diagnosis not present

## 2017-10-02 DIAGNOSIS — S0083XD Contusion of other part of head, subsequent encounter: Secondary | ICD-10-CM | POA: Diagnosis not present

## 2017-10-09 DIAGNOSIS — D631 Anemia in chronic kidney disease: Secondary | ICD-10-CM | POA: Diagnosis not present

## 2017-10-09 DIAGNOSIS — S0083XD Contusion of other part of head, subsequent encounter: Secondary | ICD-10-CM | POA: Diagnosis not present

## 2017-10-09 DIAGNOSIS — N184 Chronic kidney disease, stage 4 (severe): Secondary | ICD-10-CM | POA: Diagnosis not present

## 2017-10-09 DIAGNOSIS — K5790 Diverticulosis of intestine, part unspecified, without perforation or abscess without bleeding: Secondary | ICD-10-CM | POA: Diagnosis not present

## 2017-10-09 DIAGNOSIS — N390 Urinary tract infection, site not specified: Secondary | ICD-10-CM | POA: Diagnosis not present

## 2017-10-09 DIAGNOSIS — I129 Hypertensive chronic kidney disease with stage 1 through stage 4 chronic kidney disease, or unspecified chronic kidney disease: Secondary | ICD-10-CM | POA: Diagnosis not present

## 2017-10-09 DIAGNOSIS — E1122 Type 2 diabetes mellitus with diabetic chronic kidney disease: Secondary | ICD-10-CM | POA: Diagnosis not present

## 2017-10-09 DIAGNOSIS — N183 Chronic kidney disease, stage 3 (moderate): Secondary | ICD-10-CM | POA: Diagnosis not present

## 2017-10-09 DIAGNOSIS — I482 Chronic atrial fibrillation: Secondary | ICD-10-CM | POA: Diagnosis not present

## 2017-10-16 DIAGNOSIS — S0083XD Contusion of other part of head, subsequent encounter: Secondary | ICD-10-CM | POA: Diagnosis not present

## 2017-10-16 DIAGNOSIS — N183 Chronic kidney disease, stage 3 (moderate): Secondary | ICD-10-CM | POA: Diagnosis not present

## 2017-10-16 DIAGNOSIS — I129 Hypertensive chronic kidney disease with stage 1 through stage 4 chronic kidney disease, or unspecified chronic kidney disease: Secondary | ICD-10-CM | POA: Diagnosis not present

## 2017-10-16 DIAGNOSIS — E1122 Type 2 diabetes mellitus with diabetic chronic kidney disease: Secondary | ICD-10-CM | POA: Diagnosis not present

## 2017-10-16 DIAGNOSIS — K5790 Diverticulosis of intestine, part unspecified, without perforation or abscess without bleeding: Secondary | ICD-10-CM | POA: Diagnosis not present

## 2017-10-16 DIAGNOSIS — I482 Chronic atrial fibrillation: Secondary | ICD-10-CM | POA: Diagnosis not present

## 2017-10-18 DIAGNOSIS — N3000 Acute cystitis without hematuria: Secondary | ICD-10-CM | POA: Diagnosis not present

## 2017-10-18 DIAGNOSIS — N3946 Mixed incontinence: Secondary | ICD-10-CM | POA: Diagnosis not present

## 2017-10-23 DIAGNOSIS — I482 Chronic atrial fibrillation: Secondary | ICD-10-CM | POA: Diagnosis not present

## 2017-10-23 DIAGNOSIS — S0083XD Contusion of other part of head, subsequent encounter: Secondary | ICD-10-CM | POA: Diagnosis not present

## 2017-10-23 DIAGNOSIS — K5790 Diverticulosis of intestine, part unspecified, without perforation or abscess without bleeding: Secondary | ICD-10-CM | POA: Diagnosis not present

## 2017-10-23 DIAGNOSIS — I129 Hypertensive chronic kidney disease with stage 1 through stage 4 chronic kidney disease, or unspecified chronic kidney disease: Secondary | ICD-10-CM | POA: Diagnosis not present

## 2017-10-23 DIAGNOSIS — E1122 Type 2 diabetes mellitus with diabetic chronic kidney disease: Secondary | ICD-10-CM | POA: Diagnosis not present

## 2017-10-23 DIAGNOSIS — N183 Chronic kidney disease, stage 3 (moderate): Secondary | ICD-10-CM | POA: Diagnosis not present

## 2017-10-25 DIAGNOSIS — N3000 Acute cystitis without hematuria: Secondary | ICD-10-CM | POA: Diagnosis not present

## 2017-10-25 DIAGNOSIS — N3946 Mixed incontinence: Secondary | ICD-10-CM | POA: Diagnosis not present

## 2017-11-09 DIAGNOSIS — I482 Chronic atrial fibrillation: Secondary | ICD-10-CM | POA: Diagnosis not present

## 2017-11-09 DIAGNOSIS — E7800 Pure hypercholesterolemia, unspecified: Secondary | ICD-10-CM | POA: Diagnosis not present

## 2017-11-09 DIAGNOSIS — E039 Hypothyroidism, unspecified: Secondary | ICD-10-CM | POA: Diagnosis not present

## 2017-11-09 DIAGNOSIS — E1169 Type 2 diabetes mellitus with other specified complication: Secondary | ICD-10-CM | POA: Diagnosis not present

## 2017-11-09 DIAGNOSIS — I129 Hypertensive chronic kidney disease with stage 1 through stage 4 chronic kidney disease, or unspecified chronic kidney disease: Secondary | ICD-10-CM | POA: Diagnosis not present

## 2017-11-09 DIAGNOSIS — N184 Chronic kidney disease, stage 4 (severe): Secondary | ICD-10-CM | POA: Diagnosis not present

## 2017-11-09 DIAGNOSIS — Z23 Encounter for immunization: Secondary | ICD-10-CM | POA: Diagnosis not present

## 2017-11-09 DIAGNOSIS — K219 Gastro-esophageal reflux disease without esophagitis: Secondary | ICD-10-CM | POA: Diagnosis not present

## 2017-11-09 DIAGNOSIS — R609 Edema, unspecified: Secondary | ICD-10-CM | POA: Diagnosis not present

## 2017-11-09 DIAGNOSIS — E1122 Type 2 diabetes mellitus with diabetic chronic kidney disease: Secondary | ICD-10-CM | POA: Diagnosis not present

## 2017-11-09 DIAGNOSIS — Z8673 Personal history of transient ischemic attack (TIA), and cerebral infarction without residual deficits: Secondary | ICD-10-CM | POA: Diagnosis not present

## 2017-11-09 DIAGNOSIS — Z7901 Long term (current) use of anticoagulants: Secondary | ICD-10-CM | POA: Diagnosis not present

## 2017-11-18 ENCOUNTER — Encounter (HOSPITAL_COMMUNITY): Payer: Self-pay | Admitting: Emergency Medicine

## 2017-11-18 ENCOUNTER — Other Ambulatory Visit: Payer: Self-pay

## 2017-11-18 ENCOUNTER — Emergency Department (HOSPITAL_COMMUNITY)
Admission: EM | Admit: 2017-11-18 | Discharge: 2017-11-18 | Disposition: A | Discharge status: Home / Self Care | Payer: Medicare Other | Attending: Emergency Medicine | Admitting: Emergency Medicine

## 2017-11-18 DIAGNOSIS — E039 Hypothyroidism, unspecified: Secondary | ICD-10-CM | POA: Diagnosis not present

## 2017-11-18 DIAGNOSIS — N3001 Acute cystitis with hematuria: Secondary | ICD-10-CM | POA: Insufficient documentation

## 2017-11-18 DIAGNOSIS — Z8673 Personal history of transient ischemic attack (TIA), and cerebral infarction without residual deficits: Secondary | ICD-10-CM | POA: Insufficient documentation

## 2017-11-18 DIAGNOSIS — R339 Retention of urine, unspecified: Secondary | ICD-10-CM | POA: Diagnosis not present

## 2017-11-18 DIAGNOSIS — I10 Essential (primary) hypertension: Secondary | ICD-10-CM | POA: Insufficient documentation

## 2017-11-18 DIAGNOSIS — E119 Type 2 diabetes mellitus without complications: Secondary | ICD-10-CM | POA: Insufficient documentation

## 2017-11-18 DIAGNOSIS — Z79899 Other long term (current) drug therapy: Secondary | ICD-10-CM | POA: Insufficient documentation

## 2017-11-18 DIAGNOSIS — Z950 Presence of cardiac pacemaker: Secondary | ICD-10-CM | POA: Insufficient documentation

## 2017-11-18 LAB — CBC WITH DIFFERENTIAL/PLATELET
Abs Immature Granulocytes: 0.1 10*3/uL (ref 0.0–0.1)
Basophils Absolute: 0 10*3/uL (ref 0.0–0.1)
Basophils Relative: 1 %
Eosinophils Absolute: 0.4 10*3/uL (ref 0.0–0.7)
Eosinophils Relative: 5 %
HCT: 32.8 % — ABNORMAL LOW (ref 36.0–46.0)
Hemoglobin: 10.2 g/dL — ABNORMAL LOW (ref 12.0–15.0)
Immature Granulocytes: 1 %
Lymphocytes Relative: 16 %
Lymphs Abs: 1.3 10*3/uL (ref 0.7–4.0)
MCH: 27.3 pg (ref 26.0–34.0)
MCHC: 31.1 g/dL (ref 30.0–36.0)
MCV: 87.9 fL (ref 78.0–100.0)
Monocytes Absolute: 0.8 10*3/uL (ref 0.1–1.0)
Monocytes Relative: 9 %
Neutro Abs: 5.7 10*3/uL (ref 1.7–7.7)
Neutrophils Relative %: 68 %
Platelets: 197 10*3/uL (ref 150–400)
RBC: 3.73 MIL/uL — ABNORMAL LOW (ref 3.87–5.11)
RDW: 16.2 % — ABNORMAL HIGH (ref 11.5–15.5)
WBC: 8.3 10*3/uL (ref 4.0–10.5)

## 2017-11-18 LAB — COMPREHENSIVE METABOLIC PANEL
ALT: 16 U/L (ref 0–44)
AST: 20 U/L (ref 15–41)
Albumin: 3.6 g/dL (ref 3.5–5.0)
Alkaline Phosphatase: 57 U/L (ref 38–126)
Anion gap: 13 (ref 5–15)
BUN: 41 mg/dL — ABNORMAL HIGH (ref 8–23)
CO2: 22 mmol/L (ref 22–32)
Calcium: 9.2 mg/dL (ref 8.9–10.3)
Chloride: 103 mmol/L (ref 98–111)
Creatinine, Ser: 2.44 mg/dL — ABNORMAL HIGH (ref 0.44–1.00)
GFR calc Af Amer: 20 mL/min — ABNORMAL LOW (ref >60)
GFR calc non Af Amer: 17 mL/min — ABNORMAL LOW (ref >60)
Glucose, Bld: 126 mg/dL — ABNORMAL HIGH (ref 70–99)
Potassium: 4 mmol/L (ref 3.5–5.1)
Sodium: 138 mmol/L (ref 135–145)
Total Bilirubin: 0.6 mg/dL (ref 0.3–1.2)
Total Protein: 7.8 g/dL (ref 6.5–8.1)

## 2017-11-18 LAB — URINALYSIS, ROUTINE W REFLEX MICROSCOPIC
Bilirubin Urine: NEGATIVE
Glucose, UA: NEGATIVE mg/dL
Ketones, ur: NEGATIVE mg/dL
Nitrite: NEGATIVE
Protein, ur: NEGATIVE mg/dL
Specific Gravity, Urine: 1.011 (ref 1.005–1.030)
WBC, UA: >50 WBC/hpf — ABNORMAL HIGH (ref 0–5)
pH: 6 (ref 5.0–8.0)

## 2017-11-18 LAB — POC OCCULT BLOOD, ED: Fecal Occult Bld: POSITIVE — AB

## 2017-11-18 LAB — PROTIME-INR
INR: 1.79
Prothrombin Time: 20.6 seconds — ABNORMAL HIGH (ref 11.4–15.2)

## 2017-11-18 MED ORDER — SODIUM CHLORIDE 0.9 % IV SOLN
1.0000 g | Freq: Once | INTRAVENOUS | Status: AC
  Administered 2017-11-18: 1 g via INTRAVENOUS
  Filled 2017-11-18: qty 10

## 2017-11-18 MED ORDER — CEPHALEXIN 500 MG PO CAPS: ORAL_CAPSULE | ORAL | 0 refills | Status: DC

## 2017-11-18 NOTE — Discharge Instructions (Signed)
Contact a health care provider if: °You have back pain. °You have a fever. °You feel nauseous or vomit. °Your symptoms do not get better after 3 days. °Your symptoms go away and then return. °Get help right away if: °You have severe back pain or lower abdominal pain. °You are vomiting and cannot keep down any medicines or water. °

## 2017-11-18 NOTE — ED Provider Notes (Signed)
[place] EMERGENCY DEPARTMENT Provider Note   CSN: [id number] Arrival date & time: 11/18/17  1320     History   Chief Complaint Chief Complaint  Patient presents with  . Urinary Retention  . Rectal Bleeding    HPI Tiffany [name] is a 84 y.o. female.  She presents the emergency department with chief complaint of urinary retention.  History is given by both the patient and her daughter who was at bedside.  Her daughter states that about a week ago she was seen at her urologist office for having difficulty urinating.  She had urinary retention at that time and a UTI was treated with antibiotics.  Patient states that this morning she was trying to make urine however was unable to urinate she denies pain or urgency but just felt like she could not make it come out.  She also states that she has had frequent loose stools and is having some blood from her bottom which she believes is from a bleeding internal hemorrhoid.  She also denies rectal pain, urgency to defecate or constipation.  She is on Coumadin.  She has noticed black stools for a while but thinks is due to the iron she takes.  She denies chest pain shortness of breath, lightheadedness, palpitations,  HPI  Past Medical History:  Diagnosis Date  . Diverticulosis   . DM2 (diabetes mellitus, type 2) ([place])   . Esophageal reflux   . HTN (hypertension)   . Hyperlipidemia   . Hypothyroidism   . Microalbuminuria    last done in 1/07   . Paroxysmal atrial fibrillation (HCC)    and atrial tachycardia  . Stroke ([place]) 1999  . Tachycardia-bradycardia syndrome ([place])    s/p PPM  . Unspecified hypothyroidism 11/20/2013    Patient Active Problem List   Diagnosis Date Noted  . Acute renal failure (ARF) ([place]) 10/30/2016  . Acute renal failure syndrome ([place])   . Dehydration   . ARF (acute renal failure) ([place]) 11/19/2014  . Acute gastroenteritis 11/19/2014  . UTI (urinary tract infection) 11/22/2013  .  Hypothyroidism 11/20/2013  . Acute blood loss anemia 11/20/2013  . Hematochezia 11/18/2013  . Warfarin-induced coagulopathy ([place]) 11/18/2013  . Pacemaker-Medtronic 01/04/2012  . Long term (current) use of anticoagulants 10/25/2011  . Tachycardia-bradycardia syndrome ([place]) 01/04/2011  . Atrial fibrillation ([place]) 01/05/2010  . Hyperlipidemia 07/28/2006  . Essential hypertension 07/28/2006  . CARDIOMYOPATHY 07/28/2006  . DM type 2 (diabetes mellitus, type 2) ([place]) 05/16/2006  . C V A/STROKE 05/16/2006  . HEMORRHOIDS, WITH BLEEDING 05/16/2006    Past Surgical History:  Procedure Laterality Date  . CHOLECYSTECTOMY    . COLONOSCOPY N/A 11/21/2013   Procedure: COLONOSCOPY;  Surgeon: Jeryl Columbia, MD;  Location: [facility] ENDOSCOPY;  Service: Endoscopy;  Laterality: N/A;  . HEMORRHOID SURGERY    . hysterectomy (other)    . PACEMAKER GENERATOR CHANGE  10/03/12   DMT Advisa DR gen change by Dr Rayann Heman  . PACEMAKER GENERATOR CHANGE N/A 10/03/2012   Procedure: PACEMAKER GENERATOR CHANGE;  Surgeon: Thompson Grayer, MD;  Location: [facility] CATH LAB;  Service: Cardiovascular;  Laterality: N/A;  . permanent pacemaker  1999   updated 2006 (MDT) by Dr Verlon Setting; gen change 09-2012 by Dr Rayann Heman (MDT)     OB History   None      Home Medications    Prior to Admission medications   Medication Sig Start Date End Date Taking? Authorizing Provider  diltiazem (DILACOR XR) 180 MG 24 hr  capsule Take 180 mg by mouth daily.    [provider]  FERGON 240 (27 FE) MG tablet Take 1 tablet by mouth every evening.  10/31/14   [provider]  furosemide (LASIX) 20 MG tablet Take 20 mg by mouth daily.    [provider]  glipiZIDE (GLUCOTROL) 10 MG tablet Take 10 mg by mouth 2 (two) times daily before a meal.     [provider]  ipratropium (ATROVENT) 0.03 % nasal spray Place 1 spray into both nostrils every 12 (twelve) hours.    [provider]  JANUVIA 25 MG tablet Take 25 mg by mouth  daily at 12 noon. 08/06/17   [provider]  levothyroxine (SYNTHROID, LEVOTHROID) 50 MCG tablet Take 50 mcg by mouth daily before breakfast.     [provider]  MYRBETRIQ 25 MG TB24 tablet Take 25 tablets by mouth daily at 12 noon. 07/24/17   [provider]  ondansetron (ZOFRAN) 4 MG tablet Take 4 mg by mouth every 8 (eight) hours as needed for nausea or vomiting.    [provider]  phenol (CHLORASEPTIC) 1.4 % LIQD Use as directed 1 spray in the mouth or throat 3 (three) times daily as needed for throat irritation / pain.    [provider]  potassium chloride SA (K-DUR,KLOR-CON) 20 MEQ tablet Take 20 mEq by mouth daily.    [provider]  ranitidine (ZANTAC) 150 MG capsule Take 150 mg by mouth every morning.     [provider]  rosuvastatin (CRESTOR) 10 MG tablet Take 10 mg by mouth daily.    [provider]  Semaglutide (OZEMPIC) 0.25 or 0.5 MG/DOSE SOPN Inject 0.25 mg into the skin once a week.    [provider]  warfarin (COUMADIN) 2.5 MG tablet Take 1 tablet (2.5 mg total) by mouth every other day. Recheck INR on Monday 9/11 and if INR <3 then resume Coumadin 2.5mg  every other day for goal INR 2-3range Patient taking differently: Take 2.5 mg by mouth See admin instructions. Take 2.5 mg every day EXCEPT Sunday take 5 mg 11/06/16   Domenic Polite, MD  [facility] HFA 45 MCG/ACT inhaler Inhale 2 puffs into the lungs daily as needed for shortness of breath or wheezing. 09/10/15   [provider]    Family History Family History  Problem Relation Age of Onset  . Tuberculosis Mother   . Diabetes Father     Social History Social History   Tobacco Use  . Smoking status: Never Smoker  . Smokeless tobacco: Never Used  Substance Use Topics  . Alcohol use: No  . Drug use: No     Allergies   Patient has no known allergies.   Review of Systems Review of Systems  Ten systems reviewed and are  negative for acute change, except as noted in the HPI.   Physical Exam Updated Vital Signs BP (!) 122/54 (BP Location: Left Arm)   Pulse 61   Temp 98.3 F (36.8 C) (Oral)   Resp 16   Ht 5' 2.5" (1.588 m)   Wt 80.7 kg   SpO2 97%   BMI 32.04 kg/m   Physical Exam  Constitutional: She is oriented to person, place, and time. She appears well-developed and well-nourished. No distress.  HENT:  Head: Normocephalic and atraumatic.  Eyes: Pupils are equal, round, and reactive to light. Conjunctivae and EOM are normal. No scleral icterus.  Neck: Normal range of motion.  Cardiovascular: Normal rate,  regular rhythm and normal heart sounds. Exam reveals no gallop and no friction rub.  No murmur heard. Pulmonary/Chest: Effort normal and breath sounds normal. No respiratory distress.  Abdominal: Soft. Bowel sounds are normal. She exhibits no distension and no mass. There is no tenderness. There is no guarding.  Genitourinary:  Genitourinary Comments: Bedside bladder scan shows postvoid residual volume of 534 mL's of urine  Digital Rectal Exam reveals sphincter with good tone. No external hemorrhoids. No masses or fissures. Stool color is black, scant mucoid blood visible.   Neurological: She is alert and oriented to person, place, and time.  Skin: Skin is warm and dry. She is not diaphoretic.  Psychiatric: Her behavior is normal.  Nursing note and vitals reviewed.    ED Treatments / Results  Labs (all labs ordered are listed, but only abnormal results are displayed) Labs Reviewed  URINALYSIS, ROUTINE W REFLEX MICROSCOPIC  CBC WITH DIFFERENTIAL/PLATELET  COMPREHENSIVE METABOLIC PANEL  PROTIME-INR    EKG None  Radiology No results found.  Procedures Procedures (including critical care time)  Medications Ordered in ED Medications - No data to display   Initial Impression / Assessment and Plan / ED Course  I have reviewed the triage vital signs and the nursing  notes.  Pertinent labs & imaging results that were available during my care of the patient were reviewed by me and considered in my medical decision making (see chart for details).  Clinical Course as of Nov 18 2333  Nancy Fetter Nov 18, 2017  1731 Patient subtherapeutic on INR  INR: 1.79 [AH]  1737 Creatinine appears to be at baseline  Creatinine(!): 2.44 [AH]  1737 Hemoglobin at baseline  Hemoglobin(!): 10.2 [AH]  2012 Hemoglobin(!): 10.2 [AH]  2012 Creatinine(!): 2.44 [AH]    Clinical Course User Index [AH] Margarita Mail, PA-C    Patient with urinary tract infection.  I reviewed her microbiology reports and she had Klebsiella pneumoniae infection which was resistant to ampicillin and Macrobid.  Patient given Rocephin here.  She has a Foley catheter in place with good urinary output.  Her creatinine appears to be at baseline.  Patient will be discharged to follow-up with her urologist on this coming Friday.  She has a standing appointment at that time.  I discussed return precautions.  Final Clinical Impressions(s) / ED Diagnoses   Final diagnoses:  None    ED Discharge Orders    None       Margarita Mail, PA-C 11/19/17 0005    Gareth Morgan, MD 11/19/17 2054

## 2017-11-18 NOTE — ED Triage Notes (Signed)
Pt. Stated, I stopped peeing last night , I have some hardness on my lower stomach. I think my Hemmhroids are acting up cause I had some bleeding form my rectum

## 2017-11-18 NOTE — ED Notes (Signed)
ED Provider at bedside. 

## 2017-11-22 LAB — URINE CULTURE: Culture: >=100000 — AB

## 2017-11-23 ENCOUNTER — Telehealth: Payer: Self-pay | Admitting: *Deleted

## 2017-11-23 DIAGNOSIS — R339 Retention of urine, unspecified: Secondary | ICD-10-CM | POA: Diagnosis not present

## 2017-11-23 DIAGNOSIS — Z7984 Long term (current) use of oral hypoglycemic drugs: Secondary | ICD-10-CM | POA: Diagnosis not present

## 2017-11-23 DIAGNOSIS — I495 Sick sinus syndrome: Secondary | ICD-10-CM | POA: Diagnosis not present

## 2017-11-23 DIAGNOSIS — E1142 Type 2 diabetes mellitus with diabetic polyneuropathy: Secondary | ICD-10-CM | POA: Diagnosis not present

## 2017-11-23 DIAGNOSIS — N3000 Acute cystitis without hematuria: Secondary | ICD-10-CM | POA: Diagnosis not present

## 2017-11-23 DIAGNOSIS — E1122 Type 2 diabetes mellitus with diabetic chronic kidney disease: Secondary | ICD-10-CM | POA: Diagnosis not present

## 2017-11-23 DIAGNOSIS — Z5181 Encounter for therapeutic drug level monitoring: Secondary | ICD-10-CM | POA: Diagnosis not present

## 2017-11-23 DIAGNOSIS — Z7901 Long term (current) use of anticoagulants: Secondary | ICD-10-CM | POA: Diagnosis not present

## 2017-11-23 DIAGNOSIS — Z8673 Personal history of transient ischemic attack (TIA), and cerebral infarction without residual deficits: Secondary | ICD-10-CM | POA: Diagnosis not present

## 2017-11-23 DIAGNOSIS — K219 Gastro-esophageal reflux disease without esophagitis: Secondary | ICD-10-CM | POA: Diagnosis not present

## 2017-11-23 DIAGNOSIS — I482 Chronic atrial fibrillation: Secondary | ICD-10-CM | POA: Diagnosis not present

## 2017-11-23 DIAGNOSIS — E039 Hypothyroidism, unspecified: Secondary | ICD-10-CM | POA: Diagnosis not present

## 2017-11-23 DIAGNOSIS — I129 Hypertensive chronic kidney disease with stage 1 through stage 4 chronic kidney disease, or unspecified chronic kidney disease: Secondary | ICD-10-CM | POA: Diagnosis not present

## 2017-11-23 DIAGNOSIS — K5790 Diverticulosis of intestine, part unspecified, without perforation or abscess without bleeding: Secondary | ICD-10-CM | POA: Diagnosis not present

## 2017-11-23 DIAGNOSIS — N184 Chronic kidney disease, stage 4 (severe): Secondary | ICD-10-CM | POA: Diagnosis not present

## 2017-11-23 NOTE — Progress Notes (Signed)
ED Antimicrobial Stewardship Positive Culture Follow Up   Tiffany [name] is an 84 y.o. female who presented to [facility] on 11/18/2017 with a chief complaint of  Chief Complaint  Patient presents with  . Urinary Retention  . Rectal Bleeding    Recent Results (from the past 720 hour(s))  Urine Culture     Status: Abnormal   Collection Time: 11/18/17  3:43 PM  Result Value Ref Range Status   Specimen Description URINE, CATHETERIZED  Final   Special Requests   Final    NONE Performed at [facility], [address], [place], [place] [address]    Culture >=100,000 COLONIES/mL KLEBSIELLA OXYTOCA (A)  Final   Report Status 11/22/2017 FINAL  Final   Organism ID, Bacteria KLEBSIELLA OXYTOCA (A)  Final      Susceptibility   Klebsiella oxytoca - MIC*    AMPICILLIN >=32 RESISTANT Resistant     CEFAZOLIN >=64 RESISTANT Resistant     CEFTRIAXONE <=1 SENSITIVE Sensitive     CIPROFLOXACIN <=0.25 SENSITIVE Sensitive     GENTAMICIN <=1 SENSITIVE Sensitive     IMIPENEM <=0.25 SENSITIVE Sensitive     NITROFURANTOIN 64 INTERMEDIATE Intermediate     TRIMETH/SULFA <=20 SENSITIVE Sensitive     AMPICILLIN/SULBACTAM 8 SENSITIVE Sensitive     PIP/TAZO <=4 SENSITIVE Sensitive     Extended ESBL NEGATIVE Sensitive     * >=100,000 COLONIES/mL KLEBSIELLA OXYTOCA    [x]  Treated with cephalexin, organism resistant to prescribed antimicrobial []  Patient discharged originally without antimicrobial agent and treatment is now indicated  New antibiotic prescription: DC cephalexin, start cefpodoxime 100mg  PO daily x 7 days. Keep urology appointment.   ED Provider: Carmon Sails, PA   Socrates Cahoon, Rande Lawman 11/23/2017, 9:09 AM Clinical Pharmacist Monday - Friday phone -  [phone] Saturday - Sunday phone - [phone]

## 2017-11-23 NOTE — Telephone Encounter (Signed)
Post ED Visit - Positive Culture Follow-up: Successful Patient Follow-Up  Culture assessed and recommendations reviewed by:  []  Elenor Quinones, Pharm.D. []  Heide Guile, Pharm.D., BCPS AQ-ID []  Parks Neptune, Pharm.D., BCPS []  Alycia Rossetti, Pharm.D., BCPS []  [place], Pharm.D., BCPS, AAHIVP []  Legrand Como, Pharm.D., BCPS, AAHIVP []  Salome Arnt, PharmD, BCPS []  Johnnette Gourd, PharmD, BCPS []  Hughes Better, PharmD, BCPS []  Leeroy Cha, PharmD  Positive urine culture  []  Patient discharged without antimicrobial prescription and treatment is now indicated [x]  Organism is resistant to prescribed ED discharge antimicrobial []  Patient with positive blood cultures  Changes discussed with ED provider, Carmon Sails, PA-C New antibiotic prescription Cefpodoxine 100mg  PO QD x 7 days/Stop Cephalexin Called to [place], [phone]  Contacted patient, date 11/23/2017, time [place], [place] 11/23/2017, 10:47 AM

## 2017-11-26 DIAGNOSIS — I129 Hypertensive chronic kidney disease with stage 1 through stage 4 chronic kidney disease, or unspecified chronic kidney disease: Secondary | ICD-10-CM | POA: Diagnosis not present

## 2017-11-26 DIAGNOSIS — E1122 Type 2 diabetes mellitus with diabetic chronic kidney disease: Secondary | ICD-10-CM | POA: Diagnosis not present

## 2017-11-26 DIAGNOSIS — I482 Chronic atrial fibrillation: Secondary | ICD-10-CM | POA: Diagnosis not present

## 2017-11-26 DIAGNOSIS — N184 Chronic kidney disease, stage 4 (severe): Secondary | ICD-10-CM | POA: Diagnosis not present

## 2017-11-26 DIAGNOSIS — E1142 Type 2 diabetes mellitus with diabetic polyneuropathy: Secondary | ICD-10-CM | POA: Diagnosis not present

## 2017-11-26 DIAGNOSIS — I495 Sick sinus syndrome: Secondary | ICD-10-CM | POA: Diagnosis not present

## 2017-11-27 ENCOUNTER — Ambulatory Visit (INDEPENDENT_AMBULATORY_CARE_PROVIDER_SITE_OTHER): Payer: Medicare Other | Admitting: *Deleted

## 2017-11-27 ENCOUNTER — Encounter

## 2017-11-27 DIAGNOSIS — I442 Atrioventricular block, complete: Secondary | ICD-10-CM | POA: Diagnosis not present

## 2017-11-27 DIAGNOSIS — I495 Sick sinus syndrome: Secondary | ICD-10-CM

## 2017-11-27 NOTE — Progress Notes (Signed)
Remote pacemaker transmission.   

## 2017-11-28 DIAGNOSIS — I482 Chronic atrial fibrillation: Secondary | ICD-10-CM | POA: Diagnosis not present

## 2017-11-28 DIAGNOSIS — E1122 Type 2 diabetes mellitus with diabetic chronic kidney disease: Secondary | ICD-10-CM | POA: Diagnosis not present

## 2017-11-28 DIAGNOSIS — I129 Hypertensive chronic kidney disease with stage 1 through stage 4 chronic kidney disease, or unspecified chronic kidney disease: Secondary | ICD-10-CM | POA: Diagnosis not present

## 2017-11-28 DIAGNOSIS — E1142 Type 2 diabetes mellitus with diabetic polyneuropathy: Secondary | ICD-10-CM | POA: Diagnosis not present

## 2017-11-28 DIAGNOSIS — N184 Chronic kidney disease, stage 4 (severe): Secondary | ICD-10-CM | POA: Diagnosis not present

## 2017-11-28 DIAGNOSIS — I495 Sick sinus syndrome: Secondary | ICD-10-CM | POA: Diagnosis not present

## 2017-11-29 DIAGNOSIS — R339 Retention of urine, unspecified: Secondary | ICD-10-CM | POA: Diagnosis not present

## 2017-12-03 LAB — CUP PACEART REMOTE DEVICE CHECK
Battery Remaining Longevity: 26 mo
Battery Voltage: 2.94 V
Brady Statistic AP VP Percent: 100 %
Brady Statistic AP VS Percent: 0 %
Brady Statistic AS VP Percent: 0 %
Brady Statistic AS VS Percent: 0 %
Brady Statistic RA Percent Paced: 100 %
Brady Statistic RV Percent Paced: 99.98 %
Date Time Interrogation Session: 20191001131434
Implantable Lead Implant Date: 19990907
Implantable Lead Implant Date: 19990907
Implantable Lead Location: 753859
Implantable Lead Location: 753860
Implantable Lead Serial Number: 32904
Implantable Lead Serial Number: 33421
Implantable Pulse Generator Implant Date: 20140807
Lead Channel Impedance Value: 247 Ohm
Lead Channel Impedance Value: 266 Ohm
Lead Channel Impedance Value: 285 Ohm
Lead Channel Impedance Value: 304 Ohm
Lead Channel Pacing Threshold Amplitude: 1 V
Lead Channel Pacing Threshold Amplitude: 1.125 V
Lead Channel Pacing Threshold Pulse Width: 0.4 ms
Lead Channel Pacing Threshold Pulse Width: 0.4 ms
Lead Channel Sensing Intrinsic Amplitude: 0.25 mV
Lead Channel Sensing Intrinsic Amplitude: 0.25 mV
Lead Channel Sensing Intrinsic Amplitude: 14.375 mV
Lead Channel Sensing Intrinsic Amplitude: 14.375 mV
Lead Channel Setting Pacing Amplitude: 2 V
Lead Channel Setting Pacing Amplitude: 2.5 V
Lead Channel Setting Pacing Pulse Width: 0.4 ms
Lead Channel Setting Sensing Sensitivity: 8 mV

## 2017-12-04 DIAGNOSIS — I495 Sick sinus syndrome: Secondary | ICD-10-CM | POA: Diagnosis not present

## 2017-12-04 DIAGNOSIS — E1122 Type 2 diabetes mellitus with diabetic chronic kidney disease: Secondary | ICD-10-CM | POA: Diagnosis not present

## 2017-12-04 DIAGNOSIS — E1142 Type 2 diabetes mellitus with diabetic polyneuropathy: Secondary | ICD-10-CM | POA: Diagnosis not present

## 2017-12-04 DIAGNOSIS — N184 Chronic kidney disease, stage 4 (severe): Secondary | ICD-10-CM | POA: Diagnosis not present

## 2017-12-04 DIAGNOSIS — I482 Chronic atrial fibrillation: Secondary | ICD-10-CM | POA: Diagnosis not present

## 2017-12-04 DIAGNOSIS — I129 Hypertensive chronic kidney disease with stage 1 through stage 4 chronic kidney disease, or unspecified chronic kidney disease: Secondary | ICD-10-CM | POA: Diagnosis not present

## 2017-12-06 DIAGNOSIS — N3946 Mixed incontinence: Secondary | ICD-10-CM | POA: Diagnosis not present

## 2017-12-06 DIAGNOSIS — R339 Retention of urine, unspecified: Secondary | ICD-10-CM | POA: Diagnosis not present

## 2017-12-10 ENCOUNTER — Encounter: Payer: Self-pay | Admitting: Internal Medicine

## 2017-12-10 ENCOUNTER — Ambulatory Visit (INDEPENDENT_AMBULATORY_CARE_PROVIDER_SITE_OTHER): Payer: Medicare Other | Admitting: Internal Medicine

## 2017-12-10 VITALS — BP 110/66 | HR 74 | Ht 62.0 in | Wt 173.6 lb

## 2017-12-10 DIAGNOSIS — N184 Chronic kidney disease, stage 4 (severe): Secondary | ICD-10-CM | POA: Diagnosis not present

## 2017-12-10 DIAGNOSIS — I482 Chronic atrial fibrillation: Secondary | ICD-10-CM | POA: Diagnosis not present

## 2017-12-10 DIAGNOSIS — E1122 Type 2 diabetes mellitus with diabetic chronic kidney disease: Secondary | ICD-10-CM | POA: Diagnosis not present

## 2017-12-10 DIAGNOSIS — I495 Sick sinus syndrome: Secondary | ICD-10-CM | POA: Diagnosis not present

## 2017-12-10 DIAGNOSIS — I442 Atrioventricular block, complete: Secondary | ICD-10-CM

## 2017-12-10 DIAGNOSIS — I10 Essential (primary) hypertension: Secondary | ICD-10-CM

## 2017-12-10 DIAGNOSIS — Z950 Presence of cardiac pacemaker: Secondary | ICD-10-CM

## 2017-12-10 DIAGNOSIS — I129 Hypertensive chronic kidney disease with stage 1 through stage 4 chronic kidney disease, or unspecified chronic kidney disease: Secondary | ICD-10-CM | POA: Diagnosis not present

## 2017-12-10 DIAGNOSIS — I480 Paroxysmal atrial fibrillation: Secondary | ICD-10-CM

## 2017-12-10 DIAGNOSIS — E1142 Type 2 diabetes mellitus with diabetic polyneuropathy: Secondary | ICD-10-CM | POA: Diagnosis not present

## 2017-12-10 LAB — CUP PACEART INCLINIC DEVICE CHECK
Battery Remaining Longevity: 25 mo
Battery Voltage: 2.93 V
Brady Statistic AP VP Percent: 100 %
Brady Statistic AP VS Percent: 0 %
Brady Statistic AS VP Percent: 0 %
Brady Statistic AS VS Percent: 0 %
Brady Statistic RA Percent Paced: 100 %
Brady Statistic RV Percent Paced: 99.98 %
Date Time Interrogation Session: 20191014154025
Implantable Lead Implant Date: 19990907
Implantable Lead Implant Date: 19990907
Implantable Lead Location: 753859
Implantable Lead Location: 753860
Implantable Lead Serial Number: 32904
Implantable Lead Serial Number: 33421
Implantable Pulse Generator Implant Date: 20140807
Lead Channel Impedance Value: 247 Ohm
Lead Channel Impedance Value: 266 Ohm
Lead Channel Impedance Value: 285 Ohm
Lead Channel Impedance Value: 304 Ohm
Lead Channel Pacing Threshold Amplitude: 1 V
Lead Channel Pacing Threshold Amplitude: 1.25 V
Lead Channel Pacing Threshold Pulse Width: 0.4 ms
Lead Channel Pacing Threshold Pulse Width: 0.4 ms
Lead Channel Setting Pacing Amplitude: 2 V
Lead Channel Setting Pacing Amplitude: 2.5 V
Lead Channel Setting Pacing Pulse Width: 0.4 ms
Lead Channel Setting Sensing Sensitivity: 8 mV

## 2017-12-10 NOTE — Patient Instructions (Addendum)
Medication Instructions:  Your physician recommends that you continue on your current medications as directed. Please refer to the Current Medication list given to you today.  Labwork: None ordered.  Testing/Procedures: None ordered.  Follow-Up: Your physician wants you to follow-up in: one year with Chanetta Marshall, NP.   You will receive a reminder letter in the mail two months in advance. If you don't receive a letter, please call our office to schedule the follow-up appointment.  Remote monitoring is used to monitor your Pacemaker from home. This monitoring reduces the number of office visits required to check your device to one time per year. It allows [place] to keep an eye on the functioning of your device to ensure it is working properly. You are scheduled for a device check from home on 02/26/2018. You may send your transmission at any time that day. If you have a wireless device, the transmission will be sent automatically. After your physician reviews your transmission, you will receive a postcard with your next transmission date.  Any Other Special Instructions Will Be Listed Below (If Applicable).  If you need a refill on your cardiac medications before your next appointment, please call your pharmacy.

## 2017-12-10 NOTE — Progress Notes (Signed)
PCP: Antony Contras, MD   Primary EP:  Dr Lillette Boxer Tiffany [name] is a 84 y.o. female who presents today for routine electrophysiology followup.  Since last being seen in our clinic, the patient reports doing reasonably well. She had a mechanical fall in June with trauma and periorbital ecchymosis.  This has resolved.  She is not very active.  Today, she denies symptoms of palpitations, chest pain, shortness of breath,  lower extremity edema, dizziness, presyncope, or syncope.  The patient is otherwise without complaint today.   Past Medical History:  Diagnosis Date  . Diverticulosis   . DM2 (diabetes mellitus, type 2) ([place])   . Esophageal reflux   . HTN (hypertension)   . Hyperlipidemia   . Hypothyroidism   . Microalbuminuria    last done in 1/07   . Paroxysmal atrial fibrillation (HCC)    and atrial tachycardia  . Stroke ([place]) 1999  . Tachycardia-bradycardia syndrome ([place])    s/p PPM  . Unspecified hypothyroidism 11/20/2013   Past Surgical History:  Procedure Laterality Date  . CHOLECYSTECTOMY    . COLONOSCOPY N/A 11/21/2013   Procedure: COLONOSCOPY;  Surgeon: Jeryl Columbia, MD;  Location: [facility] ENDOSCOPY;  Service: Endoscopy;  Laterality: N/A;  . HEMORRHOID SURGERY    . hysterectomy (other)    . PACEMAKER GENERATOR CHANGE  10/03/12   DMT Advisa DR gen change by Dr Rayann Heman  . PACEMAKER GENERATOR CHANGE N/A 10/03/2012   Procedure: PACEMAKER GENERATOR CHANGE;  Surgeon: Thompson Grayer, MD;  Location: [facility] CATH LAB;  Service: Cardiovascular;  Laterality: N/A;  . permanent pacemaker  1999   updated 2006 (MDT) by Dr Verlon Setting; gen change 09-2012 by Dr Rayann Heman (MDT)    ROS- all systems are reviewed and negative except as per HPI above  Current Outpatient Medications  Medication Sig Dispense Refill  . cephALEXin (KEFLEX) 500 MG capsule 2 caps po bid x 7 days 28 capsule 0  . diltiazem (DILACOR XR) 180 MG 24 hr capsule Take 180 mg by mouth daily.    . FERGON 240 (27 FE) MG tablet Take 1  tablet by mouth every evening.   3  . furosemide (LASIX) 20 MG tablet Take 20 mg by mouth daily.    Marland Kitchen glipiZIDE (GLUCOTROL) 10 MG tablet Take 10 mg by mouth 2 (two) times daily before a meal.     . ipratropium (ATROVENT) 0.03 % nasal spray Place 1 spray into both nostrils every 12 (twelve) hours.    Marland Kitchen JANUVIA 25 MG tablet Take 25 mg by mouth daily at 12 noon.  3  . levothyroxine (SYNTHROID, LEVOTHROID) 50 MCG tablet Take 50 mcg by mouth daily before breakfast.     . MYRBETRIQ 25 MG TB24 tablet Take 25 tablets by mouth daily at 12 noon.  1  . ondansetron (ZOFRAN) 4 MG tablet Take 4 mg by mouth every 8 (eight) hours as needed for nausea or vomiting.    . phenol (CHLORASEPTIC) 1.4 % LIQD Use as directed 1 spray in the mouth or throat 3 (three) times daily as needed for throat irritation / pain.    . potassium chloride SA (K-DUR,KLOR-CON) 20 MEQ tablet Take 20 mEq by mouth daily.    . ranitidine (ZANTAC) 150 MG capsule Take 150 mg by mouth every morning.     . rosuvastatin (CRESTOR) 10 MG tablet Take 10 mg by mouth daily.    . Semaglutide (OZEMPIC) 0.25 or 0.5 MG/DOSE SOPN Inject 0.25 mg into the skin  once a week.    . warfarin (COUMADIN) 2.5 MG tablet Take 1 tablet (2.5 mg total) by mouth every other day. Recheck INR on Monday 9/11 and if INR <3 then resume Coumadin 2.5mg  every other day for goal INR 2-3range (Patient taking differently: Take 2.5 mg by mouth See admin instructions. Take 2.5 mg every day EXCEPT Sunday take 5 mg)    . XOPENEX HFA 45 MCG/ACT inhaler Inhale 2 puffs into the lungs daily as needed for shortness of breath or wheezing.     No current facility-administered medications for this visit.     Physical Exam: Vitals:   12/10/17 1424  BP: 110/66  Pulse: 74  SpO2: 98%  Weight: 173 lb 9.6 oz (78.7 kg)  Height: 5\' 2"  (1.575 m)    GEN- The patient is well appearing, alert and oriented x 3 today.   Head- normocephalic, atraumatic Eyes-  Sclera clear, conjunctiva pink Ears-  hearing intact Oropharynx- clear Lungs- Clear to ausculation bilaterally, normal work of breathing Chest- pacemaker pocket is well healed Heart- Regular rate and rhythm (paced) GI- soft, NT, ND, + BS Extremities- no clubbing, cyanosis, or edema  Pacemaker interrogation- reviewed in detail today,  See PACEART report  ekg tracing ordered today is personally reviewed and shows AV paced rhythm  Assessment and Plan:  1. Symptomatic sinus bradycardia (atrial standstill) and complete heart block Normal pacemaker function See Pace Art report No changes today  2. Paroxysmal atrail fibrillation chads2vasc score is 6.  She is on coumadin AF burden 0% since last visit  3. HTN Stable No change required today  Carelink every 3 months Return to see EP NP in a year  Thompson Grayer MD, [facility] 12/10/2017 2:31 PM

## 2017-12-11 DIAGNOSIS — I495 Sick sinus syndrome: Secondary | ICD-10-CM | POA: Diagnosis not present

## 2017-12-11 DIAGNOSIS — E1142 Type 2 diabetes mellitus with diabetic polyneuropathy: Secondary | ICD-10-CM | POA: Diagnosis not present

## 2017-12-11 DIAGNOSIS — N184 Chronic kidney disease, stage 4 (severe): Secondary | ICD-10-CM | POA: Diagnosis not present

## 2017-12-11 DIAGNOSIS — I129 Hypertensive chronic kidney disease with stage 1 through stage 4 chronic kidney disease, or unspecified chronic kidney disease: Secondary | ICD-10-CM | POA: Diagnosis not present

## 2017-12-11 DIAGNOSIS — E1122 Type 2 diabetes mellitus with diabetic chronic kidney disease: Secondary | ICD-10-CM | POA: Diagnosis not present

## 2017-12-11 DIAGNOSIS — I482 Chronic atrial fibrillation: Secondary | ICD-10-CM | POA: Diagnosis not present

## 2017-12-13 DIAGNOSIS — I482 Chronic atrial fibrillation: Secondary | ICD-10-CM | POA: Diagnosis not present

## 2017-12-13 DIAGNOSIS — I129 Hypertensive chronic kidney disease with stage 1 through stage 4 chronic kidney disease, or unspecified chronic kidney disease: Secondary | ICD-10-CM | POA: Diagnosis not present

## 2017-12-13 DIAGNOSIS — I495 Sick sinus syndrome: Secondary | ICD-10-CM | POA: Diagnosis not present

## 2017-12-13 DIAGNOSIS — E1142 Type 2 diabetes mellitus with diabetic polyneuropathy: Secondary | ICD-10-CM | POA: Diagnosis not present

## 2017-12-13 DIAGNOSIS — N184 Chronic kidney disease, stage 4 (severe): Secondary | ICD-10-CM | POA: Diagnosis not present

## 2017-12-13 DIAGNOSIS — E1122 Type 2 diabetes mellitus with diabetic chronic kidney disease: Secondary | ICD-10-CM | POA: Diagnosis not present

## 2017-12-14 DIAGNOSIS — N184 Chronic kidney disease, stage 4 (severe): Secondary | ICD-10-CM | POA: Diagnosis not present

## 2017-12-14 DIAGNOSIS — I482 Chronic atrial fibrillation: Secondary | ICD-10-CM | POA: Diagnosis not present

## 2017-12-14 DIAGNOSIS — E1142 Type 2 diabetes mellitus with diabetic polyneuropathy: Secondary | ICD-10-CM | POA: Diagnosis not present

## 2017-12-14 DIAGNOSIS — I129 Hypertensive chronic kidney disease with stage 1 through stage 4 chronic kidney disease, or unspecified chronic kidney disease: Secondary | ICD-10-CM | POA: Diagnosis not present

## 2017-12-14 DIAGNOSIS — I495 Sick sinus syndrome: Secondary | ICD-10-CM | POA: Diagnosis not present

## 2017-12-14 DIAGNOSIS — E1122 Type 2 diabetes mellitus with diabetic chronic kidney disease: Secondary | ICD-10-CM | POA: Diagnosis not present

## 2017-12-18 DIAGNOSIS — I495 Sick sinus syndrome: Secondary | ICD-10-CM | POA: Diagnosis not present

## 2017-12-18 DIAGNOSIS — I482 Chronic atrial fibrillation: Secondary | ICD-10-CM | POA: Diagnosis not present

## 2017-12-18 DIAGNOSIS — I129 Hypertensive chronic kidney disease with stage 1 through stage 4 chronic kidney disease, or unspecified chronic kidney disease: Secondary | ICD-10-CM | POA: Diagnosis not present

## 2017-12-18 DIAGNOSIS — N184 Chronic kidney disease, stage 4 (severe): Secondary | ICD-10-CM | POA: Diagnosis not present

## 2017-12-18 DIAGNOSIS — E1142 Type 2 diabetes mellitus with diabetic polyneuropathy: Secondary | ICD-10-CM | POA: Diagnosis not present

## 2017-12-18 DIAGNOSIS — E1122 Type 2 diabetes mellitus with diabetic chronic kidney disease: Secondary | ICD-10-CM | POA: Diagnosis not present

## 2017-12-19 DIAGNOSIS — I482 Chronic atrial fibrillation: Secondary | ICD-10-CM | POA: Diagnosis not present

## 2017-12-19 DIAGNOSIS — E1142 Type 2 diabetes mellitus with diabetic polyneuropathy: Secondary | ICD-10-CM | POA: Diagnosis not present

## 2017-12-19 DIAGNOSIS — I129 Hypertensive chronic kidney disease with stage 1 through stage 4 chronic kidney disease, or unspecified chronic kidney disease: Secondary | ICD-10-CM | POA: Diagnosis not present

## 2017-12-19 DIAGNOSIS — I495 Sick sinus syndrome: Secondary | ICD-10-CM | POA: Diagnosis not present

## 2017-12-19 DIAGNOSIS — E1122 Type 2 diabetes mellitus with diabetic chronic kidney disease: Secondary | ICD-10-CM | POA: Diagnosis not present

## 2017-12-19 DIAGNOSIS — N184 Chronic kidney disease, stage 4 (severe): Secondary | ICD-10-CM | POA: Diagnosis not present

## 2017-12-27 DIAGNOSIS — E1142 Type 2 diabetes mellitus with diabetic polyneuropathy: Secondary | ICD-10-CM | POA: Diagnosis not present

## 2017-12-27 DIAGNOSIS — I129 Hypertensive chronic kidney disease with stage 1 through stage 4 chronic kidney disease, or unspecified chronic kidney disease: Secondary | ICD-10-CM | POA: Diagnosis not present

## 2017-12-27 DIAGNOSIS — E1122 Type 2 diabetes mellitus with diabetic chronic kidney disease: Secondary | ICD-10-CM | POA: Diagnosis not present

## 2017-12-27 DIAGNOSIS — I482 Chronic atrial fibrillation: Secondary | ICD-10-CM | POA: Diagnosis not present

## 2017-12-27 DIAGNOSIS — I495 Sick sinus syndrome: Secondary | ICD-10-CM | POA: Diagnosis not present

## 2017-12-27 DIAGNOSIS — N184 Chronic kidney disease, stage 4 (severe): Secondary | ICD-10-CM | POA: Diagnosis not present

## 2018-01-02 DIAGNOSIS — E1142 Type 2 diabetes mellitus with diabetic polyneuropathy: Secondary | ICD-10-CM | POA: Diagnosis not present

## 2018-01-02 DIAGNOSIS — E1122 Type 2 diabetes mellitus with diabetic chronic kidney disease: Secondary | ICD-10-CM | POA: Diagnosis not present

## 2018-01-02 DIAGNOSIS — I495 Sick sinus syndrome: Secondary | ICD-10-CM | POA: Diagnosis not present

## 2018-01-02 DIAGNOSIS — I129 Hypertensive chronic kidney disease with stage 1 through stage 4 chronic kidney disease, or unspecified chronic kidney disease: Secondary | ICD-10-CM | POA: Diagnosis not present

## 2018-01-02 DIAGNOSIS — I482 Chronic atrial fibrillation: Secondary | ICD-10-CM | POA: Diagnosis not present

## 2018-01-02 DIAGNOSIS — N184 Chronic kidney disease, stage 4 (severe): Secondary | ICD-10-CM | POA: Diagnosis not present

## 2018-01-08 DIAGNOSIS — R339 Retention of urine, unspecified: Secondary | ICD-10-CM | POA: Diagnosis not present

## 2018-01-10 DIAGNOSIS — E1142 Type 2 diabetes mellitus with diabetic polyneuropathy: Secondary | ICD-10-CM | POA: Diagnosis not present

## 2018-01-10 DIAGNOSIS — I129 Hypertensive chronic kidney disease with stage 1 through stage 4 chronic kidney disease, or unspecified chronic kidney disease: Secondary | ICD-10-CM | POA: Diagnosis not present

## 2018-01-10 DIAGNOSIS — I482 Chronic atrial fibrillation: Secondary | ICD-10-CM | POA: Diagnosis not present

## 2018-01-10 DIAGNOSIS — N184 Chronic kidney disease, stage 4 (severe): Secondary | ICD-10-CM | POA: Diagnosis not present

## 2018-01-10 DIAGNOSIS — I495 Sick sinus syndrome: Secondary | ICD-10-CM | POA: Diagnosis not present

## 2018-01-10 DIAGNOSIS — E1122 Type 2 diabetes mellitus with diabetic chronic kidney disease: Secondary | ICD-10-CM | POA: Diagnosis not present

## 2018-01-18 DIAGNOSIS — I495 Sick sinus syndrome: Secondary | ICD-10-CM | POA: Diagnosis not present

## 2018-01-18 DIAGNOSIS — I482 Chronic atrial fibrillation: Secondary | ICD-10-CM | POA: Diagnosis not present

## 2018-01-18 DIAGNOSIS — E1122 Type 2 diabetes mellitus with diabetic chronic kidney disease: Secondary | ICD-10-CM | POA: Diagnosis not present

## 2018-01-18 DIAGNOSIS — N184 Chronic kidney disease, stage 4 (severe): Secondary | ICD-10-CM | POA: Diagnosis not present

## 2018-01-18 DIAGNOSIS — E1142 Type 2 diabetes mellitus with diabetic polyneuropathy: Secondary | ICD-10-CM | POA: Diagnosis not present

## 2018-01-18 DIAGNOSIS — I129 Hypertensive chronic kidney disease with stage 1 through stage 4 chronic kidney disease, or unspecified chronic kidney disease: Secondary | ICD-10-CM | POA: Diagnosis not present

## 2018-01-29 DIAGNOSIS — N189 Chronic kidney disease, unspecified: Secondary | ICD-10-CM | POA: Diagnosis not present

## 2018-01-29 DIAGNOSIS — N183 Chronic kidney disease, stage 3 (moderate): Secondary | ICD-10-CM | POA: Diagnosis not present

## 2018-02-04 DIAGNOSIS — D631 Anemia in chronic kidney disease: Secondary | ICD-10-CM | POA: Diagnosis not present

## 2018-02-04 DIAGNOSIS — N184 Chronic kidney disease, stage 4 (severe): Secondary | ICD-10-CM | POA: Diagnosis not present

## 2018-02-04 DIAGNOSIS — I129 Hypertensive chronic kidney disease with stage 1 through stage 4 chronic kidney disease, or unspecified chronic kidney disease: Secondary | ICD-10-CM | POA: Diagnosis not present

## 2018-02-11 DIAGNOSIS — R609 Edema, unspecified: Secondary | ICD-10-CM | POA: Diagnosis not present

## 2018-02-11 DIAGNOSIS — Z960 Presence of urogenital implants: Secondary | ICD-10-CM | POA: Diagnosis not present

## 2018-02-11 DIAGNOSIS — N184 Chronic kidney disease, stage 4 (severe): Secondary | ICD-10-CM | POA: Diagnosis not present

## 2018-02-11 DIAGNOSIS — I129 Hypertensive chronic kidney disease with stage 1 through stage 4 chronic kidney disease, or unspecified chronic kidney disease: Secondary | ICD-10-CM | POA: Diagnosis not present

## 2018-02-11 DIAGNOSIS — E1122 Type 2 diabetes mellitus with diabetic chronic kidney disease: Secondary | ICD-10-CM | POA: Diagnosis not present

## 2018-02-11 DIAGNOSIS — E039 Hypothyroidism, unspecified: Secondary | ICD-10-CM | POA: Diagnosis not present

## 2018-02-11 DIAGNOSIS — K219 Gastro-esophageal reflux disease without esophagitis: Secondary | ICD-10-CM | POA: Diagnosis not present

## 2018-02-11 DIAGNOSIS — E7800 Pure hypercholesterolemia, unspecified: Secondary | ICD-10-CM | POA: Diagnosis not present

## 2018-02-11 DIAGNOSIS — R339 Retention of urine, unspecified: Secondary | ICD-10-CM | POA: Diagnosis not present

## 2018-02-11 DIAGNOSIS — I4891 Unspecified atrial fibrillation: Secondary | ICD-10-CM | POA: Diagnosis not present

## 2018-02-11 DIAGNOSIS — Z8673 Personal history of transient ischemic attack (TIA), and cerebral infarction without residual deficits: Secondary | ICD-10-CM | POA: Diagnosis not present

## 2018-02-15 DIAGNOSIS — E1142 Type 2 diabetes mellitus with diabetic polyneuropathy: Secondary | ICD-10-CM | POA: Diagnosis not present

## 2018-02-15 DIAGNOSIS — E1122 Type 2 diabetes mellitus with diabetic chronic kidney disease: Secondary | ICD-10-CM | POA: Diagnosis not present

## 2018-02-15 DIAGNOSIS — Z5181 Encounter for therapeutic drug level monitoring: Secondary | ICD-10-CM | POA: Diagnosis not present

## 2018-02-15 DIAGNOSIS — Z7984 Long term (current) use of oral hypoglycemic drugs: Secondary | ICD-10-CM | POA: Diagnosis not present

## 2018-02-15 DIAGNOSIS — N183 Chronic kidney disease, stage 3 (moderate): Secondary | ICD-10-CM | POA: Diagnosis not present

## 2018-02-15 DIAGNOSIS — Z466 Encounter for fitting and adjustment of urinary device: Secondary | ICD-10-CM | POA: Diagnosis not present

## 2018-02-15 DIAGNOSIS — I480 Paroxysmal atrial fibrillation: Secondary | ICD-10-CM | POA: Diagnosis not present

## 2018-02-15 DIAGNOSIS — N3946 Mixed incontinence: Secondary | ICD-10-CM | POA: Diagnosis not present

## 2018-02-15 DIAGNOSIS — I129 Hypertensive chronic kidney disease with stage 1 through stage 4 chronic kidney disease, or unspecified chronic kidney disease: Secondary | ICD-10-CM | POA: Diagnosis not present

## 2018-02-15 DIAGNOSIS — R339 Retention of urine, unspecified: Secondary | ICD-10-CM | POA: Diagnosis not present

## 2018-02-15 DIAGNOSIS — Z7901 Long term (current) use of anticoagulants: Secondary | ICD-10-CM | POA: Diagnosis not present

## 2018-02-26 ENCOUNTER — Ambulatory Visit (INDEPENDENT_AMBULATORY_CARE_PROVIDER_SITE_OTHER): Payer: Medicare Other

## 2018-02-26 DIAGNOSIS — I495 Sick sinus syndrome: Secondary | ICD-10-CM

## 2018-02-26 DIAGNOSIS — I442 Atrioventricular block, complete: Secondary | ICD-10-CM

## 2018-02-26 LAB — CUP PACEART REMOTE DEVICE CHECK
Battery Remaining Longevity: 24 mo
Battery Voltage: 2.93 V
Brady Statistic AP VP Percent: 100 %
Brady Statistic AP VS Percent: 0 %
Brady Statistic AS VP Percent: 0 %
Brady Statistic AS VS Percent: 0 %
Brady Statistic RA Percent Paced: 100 %
Brady Statistic RV Percent Paced: 99.99 %
Date Time Interrogation Session: 20191231115020
Implantable Lead Implant Date: 19990907
Implantable Lead Implant Date: 19990907
Implantable Lead Location: 753859
Implantable Lead Location: 753860
Implantable Lead Serial Number: 32904
Implantable Lead Serial Number: 33421
Implantable Pulse Generator Implant Date: 20140807
Lead Channel Impedance Value: 247 Ohm
Lead Channel Impedance Value: 266 Ohm
Lead Channel Impedance Value: 285 Ohm
Lead Channel Impedance Value: 304 Ohm
Lead Channel Pacing Threshold Amplitude: 1 V
Lead Channel Pacing Threshold Amplitude: 1.25 V
Lead Channel Pacing Threshold Pulse Width: 0.4 ms
Lead Channel Pacing Threshold Pulse Width: 0.4 ms
Lead Channel Sensing Intrinsic Amplitude: 0.25 mV
Lead Channel Sensing Intrinsic Amplitude: 0.25 mV
Lead Channel Sensing Intrinsic Amplitude: 14.375 mV
Lead Channel Sensing Intrinsic Amplitude: 14.375 mV
Lead Channel Setting Pacing Amplitude: 2 V
Lead Channel Setting Pacing Amplitude: 2.5 V
Lead Channel Setting Pacing Pulse Width: 0.4 ms
Lead Channel Setting Sensing Sensitivity: 8 mV

## 2018-02-26 NOTE — Progress Notes (Signed)
Remote pacemaker transmission.   

## 2018-03-14 DIAGNOSIS — R339 Retention of urine, unspecified: Secondary | ICD-10-CM | POA: Diagnosis not present

## 2018-03-14 DIAGNOSIS — I129 Hypertensive chronic kidney disease with stage 1 through stage 4 chronic kidney disease, or unspecified chronic kidney disease: Secondary | ICD-10-CM | POA: Diagnosis not present

## 2018-03-14 DIAGNOSIS — I480 Paroxysmal atrial fibrillation: Secondary | ICD-10-CM | POA: Diagnosis not present

## 2018-03-14 DIAGNOSIS — E1122 Type 2 diabetes mellitus with diabetic chronic kidney disease: Secondary | ICD-10-CM | POA: Diagnosis not present

## 2018-03-14 DIAGNOSIS — Z466 Encounter for fitting and adjustment of urinary device: Secondary | ICD-10-CM | POA: Diagnosis not present

## 2018-03-14 DIAGNOSIS — N3946 Mixed incontinence: Secondary | ICD-10-CM | POA: Diagnosis not present

## 2018-03-18 DIAGNOSIS — I480 Paroxysmal atrial fibrillation: Secondary | ICD-10-CM | POA: Diagnosis not present

## 2018-03-18 DIAGNOSIS — E1122 Type 2 diabetes mellitus with diabetic chronic kidney disease: Secondary | ICD-10-CM | POA: Diagnosis not present

## 2018-03-18 DIAGNOSIS — I129 Hypertensive chronic kidney disease with stage 1 through stage 4 chronic kidney disease, or unspecified chronic kidney disease: Secondary | ICD-10-CM | POA: Diagnosis not present

## 2018-03-18 DIAGNOSIS — R339 Retention of urine, unspecified: Secondary | ICD-10-CM | POA: Diagnosis not present

## 2018-03-18 DIAGNOSIS — N3946 Mixed incontinence: Secondary | ICD-10-CM | POA: Diagnosis not present

## 2018-03-18 DIAGNOSIS — Z466 Encounter for fitting and adjustment of urinary device: Secondary | ICD-10-CM | POA: Diagnosis not present

## 2018-04-02 DIAGNOSIS — E113393 Type 2 diabetes mellitus with moderate nonproliferative diabetic retinopathy without macular edema, bilateral: Secondary | ICD-10-CM | POA: Diagnosis not present

## 2018-04-11 DIAGNOSIS — N3946 Mixed incontinence: Secondary | ICD-10-CM | POA: Diagnosis not present

## 2018-04-11 DIAGNOSIS — R339 Retention of urine, unspecified: Secondary | ICD-10-CM | POA: Diagnosis not present

## 2018-04-11 DIAGNOSIS — E1122 Type 2 diabetes mellitus with diabetic chronic kidney disease: Secondary | ICD-10-CM | POA: Diagnosis not present

## 2018-04-11 DIAGNOSIS — Z466 Encounter for fitting and adjustment of urinary device: Secondary | ICD-10-CM | POA: Diagnosis not present

## 2018-04-11 DIAGNOSIS — I480 Paroxysmal atrial fibrillation: Secondary | ICD-10-CM | POA: Diagnosis not present

## 2018-04-11 DIAGNOSIS — I129 Hypertensive chronic kidney disease with stage 1 through stage 4 chronic kidney disease, or unspecified chronic kidney disease: Secondary | ICD-10-CM | POA: Diagnosis not present

## 2018-04-16 DIAGNOSIS — Z7984 Long term (current) use of oral hypoglycemic drugs: Secondary | ICD-10-CM | POA: Diagnosis not present

## 2018-04-16 DIAGNOSIS — R339 Retention of urine, unspecified: Secondary | ICD-10-CM | POA: Diagnosis not present

## 2018-04-16 DIAGNOSIS — E1142 Type 2 diabetes mellitus with diabetic polyneuropathy: Secondary | ICD-10-CM | POA: Diagnosis not present

## 2018-04-16 DIAGNOSIS — Z7901 Long term (current) use of anticoagulants: Secondary | ICD-10-CM | POA: Diagnosis not present

## 2018-04-16 DIAGNOSIS — E1122 Type 2 diabetes mellitus with diabetic chronic kidney disease: Secondary | ICD-10-CM | POA: Diagnosis not present

## 2018-04-16 DIAGNOSIS — I480 Paroxysmal atrial fibrillation: Secondary | ICD-10-CM | POA: Diagnosis not present

## 2018-04-16 DIAGNOSIS — N3946 Mixed incontinence: Secondary | ICD-10-CM | POA: Diagnosis not present

## 2018-04-16 DIAGNOSIS — Z5181 Encounter for therapeutic drug level monitoring: Secondary | ICD-10-CM | POA: Diagnosis not present

## 2018-04-16 DIAGNOSIS — I129 Hypertensive chronic kidney disease with stage 1 through stage 4 chronic kidney disease, or unspecified chronic kidney disease: Secondary | ICD-10-CM | POA: Diagnosis not present

## 2018-04-16 DIAGNOSIS — N183 Chronic kidney disease, stage 3 (moderate): Secondary | ICD-10-CM | POA: Diagnosis not present

## 2018-04-16 DIAGNOSIS — Z466 Encounter for fitting and adjustment of urinary device: Secondary | ICD-10-CM | POA: Diagnosis not present

## 2018-05-09 DIAGNOSIS — I480 Paroxysmal atrial fibrillation: Secondary | ICD-10-CM | POA: Diagnosis not present

## 2018-05-09 DIAGNOSIS — R339 Retention of urine, unspecified: Secondary | ICD-10-CM | POA: Diagnosis not present

## 2018-05-09 DIAGNOSIS — E1122 Type 2 diabetes mellitus with diabetic chronic kidney disease: Secondary | ICD-10-CM | POA: Diagnosis not present

## 2018-05-09 DIAGNOSIS — N3946 Mixed incontinence: Secondary | ICD-10-CM | POA: Diagnosis not present

## 2018-05-09 DIAGNOSIS — Z466 Encounter for fitting and adjustment of urinary device: Secondary | ICD-10-CM | POA: Diagnosis not present

## 2018-05-09 DIAGNOSIS — I129 Hypertensive chronic kidney disease with stage 1 through stage 4 chronic kidney disease, or unspecified chronic kidney disease: Secondary | ICD-10-CM | POA: Diagnosis not present

## 2018-05-13 DIAGNOSIS — E7800 Pure hypercholesterolemia, unspecified: Secondary | ICD-10-CM | POA: Diagnosis not present

## 2018-05-13 DIAGNOSIS — R609 Edema, unspecified: Secondary | ICD-10-CM | POA: Diagnosis not present

## 2018-05-13 DIAGNOSIS — N3946 Mixed incontinence: Secondary | ICD-10-CM | POA: Diagnosis not present

## 2018-05-13 DIAGNOSIS — I129 Hypertensive chronic kidney disease with stage 1 through stage 4 chronic kidney disease, or unspecified chronic kidney disease: Secondary | ICD-10-CM | POA: Diagnosis not present

## 2018-05-13 DIAGNOSIS — E039 Hypothyroidism, unspecified: Secondary | ICD-10-CM | POA: Diagnosis not present

## 2018-05-13 DIAGNOSIS — E1169 Type 2 diabetes mellitus with other specified complication: Secondary | ICD-10-CM | POA: Diagnosis not present

## 2018-05-13 DIAGNOSIS — N183 Chronic kidney disease, stage 3 (moderate): Secondary | ICD-10-CM | POA: Diagnosis not present

## 2018-05-13 DIAGNOSIS — I480 Paroxysmal atrial fibrillation: Secondary | ICD-10-CM | POA: Diagnosis not present

## 2018-05-13 DIAGNOSIS — K219 Gastro-esophageal reflux disease without esophagitis: Secondary | ICD-10-CM | POA: Diagnosis not present

## 2018-05-13 DIAGNOSIS — L989 Disorder of the skin and subcutaneous tissue, unspecified: Secondary | ICD-10-CM | POA: Diagnosis not present

## 2018-05-16 DIAGNOSIS — Z7901 Long term (current) use of anticoagulants: Secondary | ICD-10-CM | POA: Diagnosis not present

## 2018-05-16 DIAGNOSIS — E1142 Type 2 diabetes mellitus with diabetic polyneuropathy: Secondary | ICD-10-CM | POA: Diagnosis not present

## 2018-05-16 DIAGNOSIS — R339 Retention of urine, unspecified: Secondary | ICD-10-CM | POA: Diagnosis not present

## 2018-05-16 DIAGNOSIS — Z7984 Long term (current) use of oral hypoglycemic drugs: Secondary | ICD-10-CM | POA: Diagnosis not present

## 2018-05-16 DIAGNOSIS — N183 Chronic kidney disease, stage 3 (moderate): Secondary | ICD-10-CM | POA: Diagnosis not present

## 2018-05-16 DIAGNOSIS — E1122 Type 2 diabetes mellitus with diabetic chronic kidney disease: Secondary | ICD-10-CM | POA: Diagnosis not present

## 2018-05-16 DIAGNOSIS — N3946 Mixed incontinence: Secondary | ICD-10-CM | POA: Diagnosis not present

## 2018-05-16 DIAGNOSIS — Z466 Encounter for fitting and adjustment of urinary device: Secondary | ICD-10-CM | POA: Diagnosis not present

## 2018-05-16 DIAGNOSIS — I480 Paroxysmal atrial fibrillation: Secondary | ICD-10-CM | POA: Diagnosis not present

## 2018-05-16 DIAGNOSIS — Z5181 Encounter for therapeutic drug level monitoring: Secondary | ICD-10-CM | POA: Diagnosis not present

## 2018-05-16 DIAGNOSIS — I129 Hypertensive chronic kidney disease with stage 1 through stage 4 chronic kidney disease, or unspecified chronic kidney disease: Secondary | ICD-10-CM | POA: Diagnosis not present

## 2018-05-28 ENCOUNTER — Telehealth: Payer: Self-pay | Admitting: Internal Medicine

## 2018-05-28 ENCOUNTER — Other Ambulatory Visit: Payer: Self-pay

## 2018-05-28 ENCOUNTER — Ambulatory Visit (INDEPENDENT_AMBULATORY_CARE_PROVIDER_SITE_OTHER): Payer: Medicare Other | Admitting: *Deleted

## 2018-05-28 DIAGNOSIS — I495 Sick sinus syndrome: Secondary | ICD-10-CM | POA: Diagnosis not present

## 2018-05-28 LAB — CUP PACEART REMOTE DEVICE CHECK
Battery Remaining Longevity: 20 mo
Battery Voltage: 2.92 V
Brady Statistic AP VP Percent: 100 %
Brady Statistic AP VS Percent: 0 %
Brady Statistic AS VP Percent: 0 %
Brady Statistic AS VS Percent: 0 %
Brady Statistic RA Percent Paced: 100 %
Brady Statistic RV Percent Paced: 99.99 %
Date Time Interrogation Session: 20200331101557
Implantable Lead Implant Date: 19990907
Implantable Lead Implant Date: 19990907
Implantable Lead Location: 753859
Implantable Lead Location: 753860
Implantable Lead Serial Number: 32904
Implantable Lead Serial Number: 33421
Implantable Pulse Generator Implant Date: 20140807
Lead Channel Impedance Value: 266 Ohm
Lead Channel Impedance Value: 266 Ohm
Lead Channel Impedance Value: 285 Ohm
Lead Channel Impedance Value: 304 Ohm
Lead Channel Pacing Threshold Amplitude: 1 V
Lead Channel Pacing Threshold Amplitude: 1.25 V
Lead Channel Pacing Threshold Pulse Width: 0.4 ms
Lead Channel Pacing Threshold Pulse Width: 0.4 ms
Lead Channel Sensing Intrinsic Amplitude: 0.25 mV
Lead Channel Sensing Intrinsic Amplitude: 0.25 mV
Lead Channel Sensing Intrinsic Amplitude: 14.375 mV
Lead Channel Sensing Intrinsic Amplitude: 14.375 mV
Lead Channel Setting Pacing Amplitude: 2 V
Lead Channel Setting Pacing Amplitude: 2.5 V
Lead Channel Setting Pacing Pulse Width: 0.4 ms
Lead Channel Setting Sensing Sensitivity: 8 mV

## 2018-05-28 NOTE — Telephone Encounter (Signed)
Spoke w/ pt and informed her that her remote transmission was received.

## 2018-05-28 NOTE — Telephone Encounter (Signed)
New Message:   Patient calling concerning her remote>please call patient.

## 2018-06-04 ENCOUNTER — Encounter: Payer: Self-pay | Admitting: Cardiology

## 2018-06-04 NOTE — Progress Notes (Signed)
Remote pacemaker transmission.   

## 2018-06-06 DIAGNOSIS — I129 Hypertensive chronic kidney disease with stage 1 through stage 4 chronic kidney disease, or unspecified chronic kidney disease: Secondary | ICD-10-CM | POA: Diagnosis not present

## 2018-06-06 DIAGNOSIS — Z466 Encounter for fitting and adjustment of urinary device: Secondary | ICD-10-CM | POA: Diagnosis not present

## 2018-06-06 DIAGNOSIS — R339 Retention of urine, unspecified: Secondary | ICD-10-CM | POA: Diagnosis not present

## 2018-06-06 DIAGNOSIS — I480 Paroxysmal atrial fibrillation: Secondary | ICD-10-CM | POA: Diagnosis not present

## 2018-06-06 DIAGNOSIS — E1122 Type 2 diabetes mellitus with diabetic chronic kidney disease: Secondary | ICD-10-CM | POA: Diagnosis not present

## 2018-06-06 DIAGNOSIS — N3946 Mixed incontinence: Secondary | ICD-10-CM | POA: Diagnosis not present

## 2018-06-11 DIAGNOSIS — E1122 Type 2 diabetes mellitus with diabetic chronic kidney disease: Secondary | ICD-10-CM | POA: Diagnosis not present

## 2018-06-11 DIAGNOSIS — Z466 Encounter for fitting and adjustment of urinary device: Secondary | ICD-10-CM | POA: Diagnosis not present

## 2018-06-11 DIAGNOSIS — R339 Retention of urine, unspecified: Secondary | ICD-10-CM | POA: Diagnosis not present

## 2018-06-11 DIAGNOSIS — I129 Hypertensive chronic kidney disease with stage 1 through stage 4 chronic kidney disease, or unspecified chronic kidney disease: Secondary | ICD-10-CM | POA: Diagnosis not present

## 2018-06-11 DIAGNOSIS — N3946 Mixed incontinence: Secondary | ICD-10-CM | POA: Diagnosis not present

## 2018-06-11 DIAGNOSIS — I480 Paroxysmal atrial fibrillation: Secondary | ICD-10-CM | POA: Diagnosis not present

## 2018-06-15 DIAGNOSIS — Z7901 Long term (current) use of anticoagulants: Secondary | ICD-10-CM | POA: Diagnosis not present

## 2018-06-15 DIAGNOSIS — N183 Chronic kidney disease, stage 3 (moderate): Secondary | ICD-10-CM | POA: Diagnosis not present

## 2018-06-15 DIAGNOSIS — E1122 Type 2 diabetes mellitus with diabetic chronic kidney disease: Secondary | ICD-10-CM | POA: Diagnosis not present

## 2018-06-15 DIAGNOSIS — Z7984 Long term (current) use of oral hypoglycemic drugs: Secondary | ICD-10-CM | POA: Diagnosis not present

## 2018-06-15 DIAGNOSIS — R339 Retention of urine, unspecified: Secondary | ICD-10-CM | POA: Diagnosis not present

## 2018-06-15 DIAGNOSIS — Z466 Encounter for fitting and adjustment of urinary device: Secondary | ICD-10-CM | POA: Diagnosis not present

## 2018-06-15 DIAGNOSIS — I480 Paroxysmal atrial fibrillation: Secondary | ICD-10-CM | POA: Diagnosis not present

## 2018-06-15 DIAGNOSIS — I129 Hypertensive chronic kidney disease with stage 1 through stage 4 chronic kidney disease, or unspecified chronic kidney disease: Secondary | ICD-10-CM | POA: Diagnosis not present

## 2018-06-15 DIAGNOSIS — N3946 Mixed incontinence: Secondary | ICD-10-CM | POA: Diagnosis not present

## 2018-06-15 DIAGNOSIS — E1142 Type 2 diabetes mellitus with diabetic polyneuropathy: Secondary | ICD-10-CM | POA: Diagnosis not present

## 2018-06-15 DIAGNOSIS — Z5181 Encounter for therapeutic drug level monitoring: Secondary | ICD-10-CM | POA: Diagnosis not present

## 2018-07-02 DIAGNOSIS — Z466 Encounter for fitting and adjustment of urinary device: Secondary | ICD-10-CM | POA: Diagnosis not present

## 2018-07-02 DIAGNOSIS — I129 Hypertensive chronic kidney disease with stage 1 through stage 4 chronic kidney disease, or unspecified chronic kidney disease: Secondary | ICD-10-CM | POA: Diagnosis not present

## 2018-07-02 DIAGNOSIS — R339 Retention of urine, unspecified: Secondary | ICD-10-CM | POA: Diagnosis not present

## 2018-07-02 DIAGNOSIS — I480 Paroxysmal atrial fibrillation: Secondary | ICD-10-CM | POA: Diagnosis not present

## 2018-07-02 DIAGNOSIS — N3946 Mixed incontinence: Secondary | ICD-10-CM | POA: Diagnosis not present

## 2018-07-02 DIAGNOSIS — E1122 Type 2 diabetes mellitus with diabetic chronic kidney disease: Secondary | ICD-10-CM | POA: Diagnosis not present

## 2018-07-10 DIAGNOSIS — I129 Hypertensive chronic kidney disease with stage 1 through stage 4 chronic kidney disease, or unspecified chronic kidney disease: Secondary | ICD-10-CM | POA: Diagnosis not present

## 2018-07-10 DIAGNOSIS — N3946 Mixed incontinence: Secondary | ICD-10-CM | POA: Diagnosis not present

## 2018-07-10 DIAGNOSIS — R339 Retention of urine, unspecified: Secondary | ICD-10-CM | POA: Diagnosis not present

## 2018-07-10 DIAGNOSIS — E1122 Type 2 diabetes mellitus with diabetic chronic kidney disease: Secondary | ICD-10-CM | POA: Diagnosis not present

## 2018-07-10 DIAGNOSIS — I480 Paroxysmal atrial fibrillation: Secondary | ICD-10-CM | POA: Diagnosis not present

## 2018-07-10 DIAGNOSIS — Z466 Encounter for fitting and adjustment of urinary device: Secondary | ICD-10-CM | POA: Diagnosis not present

## 2018-07-15 DIAGNOSIS — L304 Erythema intertrigo: Secondary | ICD-10-CM | POA: Diagnosis not present

## 2018-07-15 DIAGNOSIS — R339 Retention of urine, unspecified: Secondary | ICD-10-CM | POA: Diagnosis not present

## 2018-07-15 DIAGNOSIS — Z7984 Long term (current) use of oral hypoglycemic drugs: Secondary | ICD-10-CM | POA: Diagnosis not present

## 2018-07-15 DIAGNOSIS — E1142 Type 2 diabetes mellitus with diabetic polyneuropathy: Secondary | ICD-10-CM | POA: Diagnosis not present

## 2018-07-15 DIAGNOSIS — Z466 Encounter for fitting and adjustment of urinary device: Secondary | ICD-10-CM | POA: Diagnosis not present

## 2018-07-15 DIAGNOSIS — I480 Paroxysmal atrial fibrillation: Secondary | ICD-10-CM | POA: Diagnosis not present

## 2018-07-15 DIAGNOSIS — Z7901 Long term (current) use of anticoagulants: Secondary | ICD-10-CM | POA: Diagnosis not present

## 2018-07-15 DIAGNOSIS — N183 Chronic kidney disease, stage 3 (moderate): Secondary | ICD-10-CM | POA: Diagnosis not present

## 2018-07-15 DIAGNOSIS — N3946 Mixed incontinence: Secondary | ICD-10-CM | POA: Diagnosis not present

## 2018-07-15 DIAGNOSIS — C44319 Basal cell carcinoma of skin of other parts of face: Secondary | ICD-10-CM | POA: Diagnosis not present

## 2018-07-15 DIAGNOSIS — I129 Hypertensive chronic kidney disease with stage 1 through stage 4 chronic kidney disease, or unspecified chronic kidney disease: Secondary | ICD-10-CM | POA: Diagnosis not present

## 2018-07-15 DIAGNOSIS — D485 Neoplasm of uncertain behavior of skin: Secondary | ICD-10-CM | POA: Diagnosis not present

## 2018-07-15 DIAGNOSIS — Z5181 Encounter for therapeutic drug level monitoring: Secondary | ICD-10-CM | POA: Diagnosis not present

## 2018-07-15 DIAGNOSIS — E1122 Type 2 diabetes mellitus with diabetic chronic kidney disease: Secondary | ICD-10-CM | POA: Diagnosis not present

## 2018-07-25 DIAGNOSIS — C44319 Basal cell carcinoma of skin of other parts of face: Secondary | ICD-10-CM | POA: Diagnosis not present

## 2018-07-25 DIAGNOSIS — Z85828 Personal history of other malignant neoplasm of skin: Secondary | ICD-10-CM | POA: Diagnosis not present

## 2018-07-25 DIAGNOSIS — C44329 Squamous cell carcinoma of skin of other parts of face: Secondary | ICD-10-CM | POA: Diagnosis not present

## 2018-07-30 DIAGNOSIS — N3946 Mixed incontinence: Secondary | ICD-10-CM | POA: Diagnosis not present

## 2018-07-30 DIAGNOSIS — R339 Retention of urine, unspecified: Secondary | ICD-10-CM | POA: Diagnosis not present

## 2018-07-30 DIAGNOSIS — Z466 Encounter for fitting and adjustment of urinary device: Secondary | ICD-10-CM | POA: Diagnosis not present

## 2018-07-30 DIAGNOSIS — I480 Paroxysmal atrial fibrillation: Secondary | ICD-10-CM | POA: Diagnosis not present

## 2018-07-30 DIAGNOSIS — E1122 Type 2 diabetes mellitus with diabetic chronic kidney disease: Secondary | ICD-10-CM | POA: Diagnosis not present

## 2018-07-30 DIAGNOSIS — I129 Hypertensive chronic kidney disease with stage 1 through stage 4 chronic kidney disease, or unspecified chronic kidney disease: Secondary | ICD-10-CM | POA: Diagnosis not present

## 2018-07-31 DIAGNOSIS — N189 Chronic kidney disease, unspecified: Secondary | ICD-10-CM | POA: Diagnosis not present

## 2018-07-31 DIAGNOSIS — I129 Hypertensive chronic kidney disease with stage 1 through stage 4 chronic kidney disease, or unspecified chronic kidney disease: Secondary | ICD-10-CM | POA: Diagnosis not present

## 2018-07-31 DIAGNOSIS — N184 Chronic kidney disease, stage 4 (severe): Secondary | ICD-10-CM | POA: Diagnosis not present

## 2018-07-31 DIAGNOSIS — D631 Anemia in chronic kidney disease: Secondary | ICD-10-CM | POA: Diagnosis not present

## 2018-08-13 DIAGNOSIS — I129 Hypertensive chronic kidney disease with stage 1 through stage 4 chronic kidney disease, or unspecified chronic kidney disease: Secondary | ICD-10-CM | POA: Diagnosis not present

## 2018-08-13 DIAGNOSIS — E1122 Type 2 diabetes mellitus with diabetic chronic kidney disease: Secondary | ICD-10-CM | POA: Diagnosis not present

## 2018-08-13 DIAGNOSIS — N3946 Mixed incontinence: Secondary | ICD-10-CM | POA: Diagnosis not present

## 2018-08-13 DIAGNOSIS — R339 Retention of urine, unspecified: Secondary | ICD-10-CM | POA: Diagnosis not present

## 2018-08-13 DIAGNOSIS — Z466 Encounter for fitting and adjustment of urinary device: Secondary | ICD-10-CM | POA: Diagnosis not present

## 2018-08-13 DIAGNOSIS — I480 Paroxysmal atrial fibrillation: Secondary | ICD-10-CM | POA: Diagnosis not present

## 2018-08-14 DIAGNOSIS — Z466 Encounter for fitting and adjustment of urinary device: Secondary | ICD-10-CM | POA: Diagnosis not present

## 2018-08-14 DIAGNOSIS — I480 Paroxysmal atrial fibrillation: Secondary | ICD-10-CM | POA: Diagnosis not present

## 2018-08-14 DIAGNOSIS — R339 Retention of urine, unspecified: Secondary | ICD-10-CM | POA: Diagnosis not present

## 2018-08-14 DIAGNOSIS — Z7984 Long term (current) use of oral hypoglycemic drugs: Secondary | ICD-10-CM | POA: Diagnosis not present

## 2018-08-14 DIAGNOSIS — E1142 Type 2 diabetes mellitus with diabetic polyneuropathy: Secondary | ICD-10-CM | POA: Diagnosis not present

## 2018-08-14 DIAGNOSIS — E1122 Type 2 diabetes mellitus with diabetic chronic kidney disease: Secondary | ICD-10-CM | POA: Diagnosis not present

## 2018-08-14 DIAGNOSIS — Z7901 Long term (current) use of anticoagulants: Secondary | ICD-10-CM | POA: Diagnosis not present

## 2018-08-14 DIAGNOSIS — Z5181 Encounter for therapeutic drug level monitoring: Secondary | ICD-10-CM | POA: Diagnosis not present

## 2018-08-14 DIAGNOSIS — N183 Chronic kidney disease, stage 3 (moderate): Secondary | ICD-10-CM | POA: Diagnosis not present

## 2018-08-14 DIAGNOSIS — I129 Hypertensive chronic kidney disease with stage 1 through stage 4 chronic kidney disease, or unspecified chronic kidney disease: Secondary | ICD-10-CM | POA: Diagnosis not present

## 2018-08-14 DIAGNOSIS — N3946 Mixed incontinence: Secondary | ICD-10-CM | POA: Diagnosis not present

## 2018-08-22 DIAGNOSIS — Z466 Encounter for fitting and adjustment of urinary device: Secondary | ICD-10-CM | POA: Diagnosis not present

## 2018-08-22 DIAGNOSIS — E1122 Type 2 diabetes mellitus with diabetic chronic kidney disease: Secondary | ICD-10-CM | POA: Diagnosis not present

## 2018-08-22 DIAGNOSIS — I480 Paroxysmal atrial fibrillation: Secondary | ICD-10-CM | POA: Diagnosis not present

## 2018-08-22 DIAGNOSIS — I129 Hypertensive chronic kidney disease with stage 1 through stage 4 chronic kidney disease, or unspecified chronic kidney disease: Secondary | ICD-10-CM | POA: Diagnosis not present

## 2018-08-22 DIAGNOSIS — N3946 Mixed incontinence: Secondary | ICD-10-CM | POA: Diagnosis not present

## 2018-08-22 DIAGNOSIS — R339 Retention of urine, unspecified: Secondary | ICD-10-CM | POA: Diagnosis not present

## 2018-08-27 ENCOUNTER — Ambulatory Visit (INDEPENDENT_AMBULATORY_CARE_PROVIDER_SITE_OTHER): Payer: Medicare Other | Admitting: *Deleted

## 2018-08-27 DIAGNOSIS — I495 Sick sinus syndrome: Secondary | ICD-10-CM

## 2018-08-27 LAB — CUP PACEART REMOTE DEVICE CHECK
Battery Remaining Longevity: 18 mo
Battery Voltage: 2.91 V
Brady Statistic AP VP Percent: 99.96 %
Brady Statistic AP VS Percent: 0.04 %
Brady Statistic AS VP Percent: 0 %
Brady Statistic AS VS Percent: 0 %
Brady Statistic RA Percent Paced: 100 %
Brady Statistic RV Percent Paced: 99.68 %
Date Time Interrogation Session: 20200630131442
Implantable Lead Implant Date: 19990907
Implantable Lead Implant Date: 19990907
Implantable Lead Location: 753859
Implantable Lead Location: 753860
Implantable Lead Serial Number: 32904
Implantable Lead Serial Number: 33421
Implantable Pulse Generator Implant Date: 20140807
Lead Channel Impedance Value: 266 Ohm
Lead Channel Impedance Value: 266 Ohm
Lead Channel Impedance Value: 304 Ohm
Lead Channel Impedance Value: 304 Ohm
Lead Channel Pacing Threshold Amplitude: 1 V
Lead Channel Pacing Threshold Amplitude: 1.25 V
Lead Channel Pacing Threshold Pulse Width: 0.4 ms
Lead Channel Pacing Threshold Pulse Width: 0.4 ms
Lead Channel Sensing Intrinsic Amplitude: 0.25 mV
Lead Channel Sensing Intrinsic Amplitude: 0.25 mV
Lead Channel Sensing Intrinsic Amplitude: 10.5 mV
Lead Channel Sensing Intrinsic Amplitude: 10.5 mV
Lead Channel Setting Pacing Amplitude: 2 V
Lead Channel Setting Pacing Amplitude: 2.5 V
Lead Channel Setting Pacing Pulse Width: 0.4 ms
Lead Channel Setting Sensing Sensitivity: 8 mV

## 2018-08-28 DIAGNOSIS — I480 Paroxysmal atrial fibrillation: Secondary | ICD-10-CM | POA: Diagnosis not present

## 2018-08-28 DIAGNOSIS — I129 Hypertensive chronic kidney disease with stage 1 through stage 4 chronic kidney disease, or unspecified chronic kidney disease: Secondary | ICD-10-CM | POA: Diagnosis not present

## 2018-08-28 DIAGNOSIS — Z466 Encounter for fitting and adjustment of urinary device: Secondary | ICD-10-CM | POA: Diagnosis not present

## 2018-08-28 DIAGNOSIS — E1122 Type 2 diabetes mellitus with diabetic chronic kidney disease: Secondary | ICD-10-CM | POA: Diagnosis not present

## 2018-08-28 DIAGNOSIS — R339 Retention of urine, unspecified: Secondary | ICD-10-CM | POA: Diagnosis not present

## 2018-08-28 DIAGNOSIS — N3946 Mixed incontinence: Secondary | ICD-10-CM | POA: Diagnosis not present

## 2018-09-03 DIAGNOSIS — R339 Retention of urine, unspecified: Secondary | ICD-10-CM | POA: Diagnosis not present

## 2018-09-06 DIAGNOSIS — N3946 Mixed incontinence: Secondary | ICD-10-CM | POA: Diagnosis not present

## 2018-09-06 DIAGNOSIS — Z466 Encounter for fitting and adjustment of urinary device: Secondary | ICD-10-CM | POA: Diagnosis not present

## 2018-09-06 DIAGNOSIS — E1122 Type 2 diabetes mellitus with diabetic chronic kidney disease: Secondary | ICD-10-CM | POA: Diagnosis not present

## 2018-09-06 DIAGNOSIS — I129 Hypertensive chronic kidney disease with stage 1 through stage 4 chronic kidney disease, or unspecified chronic kidney disease: Secondary | ICD-10-CM | POA: Diagnosis not present

## 2018-09-06 DIAGNOSIS — I480 Paroxysmal atrial fibrillation: Secondary | ICD-10-CM | POA: Diagnosis not present

## 2018-09-06 DIAGNOSIS — R339 Retention of urine, unspecified: Secondary | ICD-10-CM | POA: Diagnosis not present

## 2018-09-07 ENCOUNTER — Encounter: Payer: Self-pay | Admitting: Cardiology

## 2018-09-07 NOTE — Progress Notes (Signed)
Remote pacemaker transmission.   

## 2018-09-11 DIAGNOSIS — N3946 Mixed incontinence: Secondary | ICD-10-CM | POA: Diagnosis not present

## 2018-09-11 DIAGNOSIS — R339 Retention of urine, unspecified: Secondary | ICD-10-CM | POA: Diagnosis not present

## 2018-09-11 DIAGNOSIS — E1122 Type 2 diabetes mellitus with diabetic chronic kidney disease: Secondary | ICD-10-CM | POA: Diagnosis not present

## 2018-09-11 DIAGNOSIS — I480 Paroxysmal atrial fibrillation: Secondary | ICD-10-CM | POA: Diagnosis not present

## 2018-09-11 DIAGNOSIS — I129 Hypertensive chronic kidney disease with stage 1 through stage 4 chronic kidney disease, or unspecified chronic kidney disease: Secondary | ICD-10-CM | POA: Diagnosis not present

## 2018-09-11 DIAGNOSIS — Z466 Encounter for fitting and adjustment of urinary device: Secondary | ICD-10-CM | POA: Diagnosis not present

## 2018-09-12 DIAGNOSIS — K130 Diseases of lips: Secondary | ICD-10-CM | POA: Diagnosis not present

## 2018-09-13 DIAGNOSIS — E1122 Type 2 diabetes mellitus with diabetic chronic kidney disease: Secondary | ICD-10-CM | POA: Diagnosis not present

## 2018-09-13 DIAGNOSIS — Z7984 Long term (current) use of oral hypoglycemic drugs: Secondary | ICD-10-CM | POA: Diagnosis not present

## 2018-09-13 DIAGNOSIS — Z466 Encounter for fitting and adjustment of urinary device: Secondary | ICD-10-CM | POA: Diagnosis not present

## 2018-09-13 DIAGNOSIS — N3946 Mixed incontinence: Secondary | ICD-10-CM | POA: Diagnosis not present

## 2018-09-13 DIAGNOSIS — Z5181 Encounter for therapeutic drug level monitoring: Secondary | ICD-10-CM | POA: Diagnosis not present

## 2018-09-13 DIAGNOSIS — R339 Retention of urine, unspecified: Secondary | ICD-10-CM | POA: Diagnosis not present

## 2018-09-13 DIAGNOSIS — I480 Paroxysmal atrial fibrillation: Secondary | ICD-10-CM | POA: Diagnosis not present

## 2018-09-13 DIAGNOSIS — N183 Chronic kidney disease, stage 3 (moderate): Secondary | ICD-10-CM | POA: Diagnosis not present

## 2018-09-13 DIAGNOSIS — E1142 Type 2 diabetes mellitus with diabetic polyneuropathy: Secondary | ICD-10-CM | POA: Diagnosis not present

## 2018-09-13 DIAGNOSIS — I129 Hypertensive chronic kidney disease with stage 1 through stage 4 chronic kidney disease, or unspecified chronic kidney disease: Secondary | ICD-10-CM | POA: Diagnosis not present

## 2018-09-13 DIAGNOSIS — Z7901 Long term (current) use of anticoagulants: Secondary | ICD-10-CM | POA: Diagnosis not present

## 2018-10-01 DIAGNOSIS — E113393 Type 2 diabetes mellitus with moderate nonproliferative diabetic retinopathy without macular edema, bilateral: Secondary | ICD-10-CM | POA: Diagnosis not present

## 2018-10-01 DIAGNOSIS — H43821 Vitreomacular adhesion, right eye: Secondary | ICD-10-CM | POA: Diagnosis not present

## 2018-10-11 DIAGNOSIS — Z466 Encounter for fitting and adjustment of urinary device: Secondary | ICD-10-CM | POA: Diagnosis not present

## 2018-10-11 DIAGNOSIS — I129 Hypertensive chronic kidney disease with stage 1 through stage 4 chronic kidney disease, or unspecified chronic kidney disease: Secondary | ICD-10-CM | POA: Diagnosis not present

## 2018-10-11 DIAGNOSIS — N3946 Mixed incontinence: Secondary | ICD-10-CM | POA: Diagnosis not present

## 2018-10-11 DIAGNOSIS — E1122 Type 2 diabetes mellitus with diabetic chronic kidney disease: Secondary | ICD-10-CM | POA: Diagnosis not present

## 2018-10-11 DIAGNOSIS — I480 Paroxysmal atrial fibrillation: Secondary | ICD-10-CM | POA: Diagnosis not present

## 2018-10-11 DIAGNOSIS — R339 Retention of urine, unspecified: Secondary | ICD-10-CM | POA: Diagnosis not present

## 2018-10-13 DIAGNOSIS — N3946 Mixed incontinence: Secondary | ICD-10-CM | POA: Diagnosis not present

## 2018-10-13 DIAGNOSIS — N183 Chronic kidney disease, stage 3 (moderate): Secondary | ICD-10-CM | POA: Diagnosis not present

## 2018-10-13 DIAGNOSIS — Z466 Encounter for fitting and adjustment of urinary device: Secondary | ICD-10-CM | POA: Diagnosis not present

## 2018-10-13 DIAGNOSIS — I129 Hypertensive chronic kidney disease with stage 1 through stage 4 chronic kidney disease, or unspecified chronic kidney disease: Secondary | ICD-10-CM | POA: Diagnosis not present

## 2018-10-13 DIAGNOSIS — E1122 Type 2 diabetes mellitus with diabetic chronic kidney disease: Secondary | ICD-10-CM | POA: Diagnosis not present

## 2018-10-13 DIAGNOSIS — Z7984 Long term (current) use of oral hypoglycemic drugs: Secondary | ICD-10-CM | POA: Diagnosis not present

## 2018-10-13 DIAGNOSIS — Z7901 Long term (current) use of anticoagulants: Secondary | ICD-10-CM | POA: Diagnosis not present

## 2018-10-13 DIAGNOSIS — Z5181 Encounter for therapeutic drug level monitoring: Secondary | ICD-10-CM | POA: Diagnosis not present

## 2018-10-13 DIAGNOSIS — R339 Retention of urine, unspecified: Secondary | ICD-10-CM | POA: Diagnosis not present

## 2018-10-13 DIAGNOSIS — E1142 Type 2 diabetes mellitus with diabetic polyneuropathy: Secondary | ICD-10-CM | POA: Diagnosis not present

## 2018-10-13 DIAGNOSIS — I480 Paroxysmal atrial fibrillation: Secondary | ICD-10-CM | POA: Diagnosis not present

## 2018-10-23 DIAGNOSIS — E1122 Type 2 diabetes mellitus with diabetic chronic kidney disease: Secondary | ICD-10-CM | POA: Diagnosis not present

## 2018-10-23 DIAGNOSIS — I480 Paroxysmal atrial fibrillation: Secondary | ICD-10-CM | POA: Diagnosis not present

## 2018-10-23 DIAGNOSIS — R339 Retention of urine, unspecified: Secondary | ICD-10-CM | POA: Diagnosis not present

## 2018-10-23 DIAGNOSIS — I129 Hypertensive chronic kidney disease with stage 1 through stage 4 chronic kidney disease, or unspecified chronic kidney disease: Secondary | ICD-10-CM | POA: Diagnosis not present

## 2018-10-23 DIAGNOSIS — N3946 Mixed incontinence: Secondary | ICD-10-CM | POA: Diagnosis not present

## 2018-10-23 DIAGNOSIS — Z466 Encounter for fitting and adjustment of urinary device: Secondary | ICD-10-CM | POA: Diagnosis not present

## 2018-11-11 DIAGNOSIS — Z466 Encounter for fitting and adjustment of urinary device: Secondary | ICD-10-CM | POA: Diagnosis not present

## 2018-11-11 DIAGNOSIS — N3946 Mixed incontinence: Secondary | ICD-10-CM | POA: Diagnosis not present

## 2018-11-11 DIAGNOSIS — I480 Paroxysmal atrial fibrillation: Secondary | ICD-10-CM | POA: Diagnosis not present

## 2018-11-11 DIAGNOSIS — E1122 Type 2 diabetes mellitus with diabetic chronic kidney disease: Secondary | ICD-10-CM | POA: Diagnosis not present

## 2018-11-11 DIAGNOSIS — I129 Hypertensive chronic kidney disease with stage 1 through stage 4 chronic kidney disease, or unspecified chronic kidney disease: Secondary | ICD-10-CM | POA: Diagnosis not present

## 2018-11-11 DIAGNOSIS — R339 Retention of urine, unspecified: Secondary | ICD-10-CM | POA: Diagnosis not present

## 2018-11-12 DIAGNOSIS — E1142 Type 2 diabetes mellitus with diabetic polyneuropathy: Secondary | ICD-10-CM | POA: Diagnosis not present

## 2018-11-12 DIAGNOSIS — E1122 Type 2 diabetes mellitus with diabetic chronic kidney disease: Secondary | ICD-10-CM | POA: Diagnosis not present

## 2018-11-12 DIAGNOSIS — N183 Chronic kidney disease, stage 3 (moderate): Secondary | ICD-10-CM | POA: Diagnosis not present

## 2018-11-12 DIAGNOSIS — Z7901 Long term (current) use of anticoagulants: Secondary | ICD-10-CM | POA: Diagnosis not present

## 2018-11-12 DIAGNOSIS — N3946 Mixed incontinence: Secondary | ICD-10-CM | POA: Diagnosis not present

## 2018-11-12 DIAGNOSIS — R339 Retention of urine, unspecified: Secondary | ICD-10-CM | POA: Diagnosis not present

## 2018-11-12 DIAGNOSIS — Z466 Encounter for fitting and adjustment of urinary device: Secondary | ICD-10-CM | POA: Diagnosis not present

## 2018-11-12 DIAGNOSIS — I129 Hypertensive chronic kidney disease with stage 1 through stage 4 chronic kidney disease, or unspecified chronic kidney disease: Secondary | ICD-10-CM | POA: Diagnosis not present

## 2018-11-12 DIAGNOSIS — Z7984 Long term (current) use of oral hypoglycemic drugs: Secondary | ICD-10-CM | POA: Diagnosis not present

## 2018-11-12 DIAGNOSIS — Z5181 Encounter for therapeutic drug level monitoring: Secondary | ICD-10-CM | POA: Diagnosis not present

## 2018-11-12 DIAGNOSIS — I480 Paroxysmal atrial fibrillation: Secondary | ICD-10-CM | POA: Diagnosis not present

## 2018-11-14 DIAGNOSIS — I129 Hypertensive chronic kidney disease with stage 1 through stage 4 chronic kidney disease, or unspecified chronic kidney disease: Secondary | ICD-10-CM | POA: Diagnosis not present

## 2018-11-14 DIAGNOSIS — Z23 Encounter for immunization: Secondary | ICD-10-CM | POA: Diagnosis not present

## 2018-11-14 DIAGNOSIS — R609 Edema, unspecified: Secondary | ICD-10-CM | POA: Diagnosis not present

## 2018-11-14 DIAGNOSIS — N183 Chronic kidney disease, stage 3 (moderate): Secondary | ICD-10-CM | POA: Diagnosis not present

## 2018-11-14 DIAGNOSIS — Z1389 Encounter for screening for other disorder: Secondary | ICD-10-CM | POA: Diagnosis not present

## 2018-11-14 DIAGNOSIS — E7800 Pure hypercholesterolemia, unspecified: Secondary | ICD-10-CM | POA: Diagnosis not present

## 2018-11-14 DIAGNOSIS — E1169 Type 2 diabetes mellitus with other specified complication: Secondary | ICD-10-CM | POA: Diagnosis not present

## 2018-11-14 DIAGNOSIS — N3946 Mixed incontinence: Secondary | ICD-10-CM | POA: Diagnosis not present

## 2018-11-14 DIAGNOSIS — K219 Gastro-esophageal reflux disease without esophagitis: Secondary | ICD-10-CM | POA: Diagnosis not present

## 2018-11-14 DIAGNOSIS — E039 Hypothyroidism, unspecified: Secondary | ICD-10-CM | POA: Diagnosis not present

## 2018-11-14 DIAGNOSIS — I480 Paroxysmal atrial fibrillation: Secondary | ICD-10-CM | POA: Diagnosis not present

## 2018-11-14 DIAGNOSIS — Z85828 Personal history of other malignant neoplasm of skin: Secondary | ICD-10-CM | POA: Diagnosis not present

## 2018-11-18 DIAGNOSIS — I480 Paroxysmal atrial fibrillation: Secondary | ICD-10-CM | POA: Diagnosis not present

## 2018-11-18 DIAGNOSIS — N3946 Mixed incontinence: Secondary | ICD-10-CM | POA: Diagnosis not present

## 2018-11-18 DIAGNOSIS — I129 Hypertensive chronic kidney disease with stage 1 through stage 4 chronic kidney disease, or unspecified chronic kidney disease: Secondary | ICD-10-CM | POA: Diagnosis not present

## 2018-11-18 DIAGNOSIS — E1122 Type 2 diabetes mellitus with diabetic chronic kidney disease: Secondary | ICD-10-CM | POA: Diagnosis not present

## 2018-11-18 DIAGNOSIS — Z466 Encounter for fitting and adjustment of urinary device: Secondary | ICD-10-CM | POA: Diagnosis not present

## 2018-11-18 DIAGNOSIS — R339 Retention of urine, unspecified: Secondary | ICD-10-CM | POA: Diagnosis not present

## 2018-11-26 ENCOUNTER — Ambulatory Visit (INDEPENDENT_AMBULATORY_CARE_PROVIDER_SITE_OTHER): Payer: Medicare Other | Admitting: *Deleted

## 2018-11-26 DIAGNOSIS — I480 Paroxysmal atrial fibrillation: Secondary | ICD-10-CM

## 2018-11-26 DIAGNOSIS — I495 Sick sinus syndrome: Secondary | ICD-10-CM | POA: Diagnosis not present

## 2018-11-26 LAB — CUP PACEART REMOTE DEVICE CHECK
Battery Remaining Longevity: 15 mo
Battery Voltage: 2.9 V
Brady Statistic AP VP Percent: 99.98 %
Brady Statistic AP VS Percent: 0.02 %
Brady Statistic AS VP Percent: 0 %
Brady Statistic AS VS Percent: 0 %
Brady Statistic RA Percent Paced: 100 %
Brady Statistic RV Percent Paced: 99.91 %
Date Time Interrogation Session: 20200929132100
Implantable Lead Implant Date: 19990907
Implantable Lead Implant Date: 19990907
Implantable Lead Location: 753859
Implantable Lead Location: 753860
Implantable Lead Serial Number: 32904
Implantable Lead Serial Number: 33421
Implantable Pulse Generator Implant Date: 20140807
Lead Channel Impedance Value: 266 Ohm
Lead Channel Impedance Value: 266 Ohm
Lead Channel Impedance Value: 285 Ohm
Lead Channel Impedance Value: 304 Ohm
Lead Channel Pacing Threshold Amplitude: 1 V
Lead Channel Pacing Threshold Amplitude: 1.25 V
Lead Channel Pacing Threshold Pulse Width: 0.4 ms
Lead Channel Pacing Threshold Pulse Width: 0.4 ms
Lead Channel Sensing Intrinsic Amplitude: 0.25 mV
Lead Channel Sensing Intrinsic Amplitude: 0.25 mV
Lead Channel Sensing Intrinsic Amplitude: 10.5 mV
Lead Channel Sensing Intrinsic Amplitude: 10.5 mV
Lead Channel Setting Pacing Amplitude: 2 V
Lead Channel Setting Pacing Amplitude: 2.5 V
Lead Channel Setting Pacing Pulse Width: 0.4 ms
Lead Channel Setting Sensing Sensitivity: 8 mV

## 2018-11-27 DIAGNOSIS — N184 Chronic kidney disease, stage 4 (severe): Secondary | ICD-10-CM | POA: Diagnosis not present

## 2018-11-27 DIAGNOSIS — I482 Chronic atrial fibrillation, unspecified: Secondary | ICD-10-CM | POA: Diagnosis not present

## 2018-11-27 DIAGNOSIS — D509 Iron deficiency anemia, unspecified: Secondary | ICD-10-CM | POA: Diagnosis not present

## 2018-11-27 DIAGNOSIS — I4891 Unspecified atrial fibrillation: Secondary | ICD-10-CM | POA: Diagnosis not present

## 2018-11-27 DIAGNOSIS — R54 Age-related physical debility: Secondary | ICD-10-CM | POA: Diagnosis not present

## 2018-11-27 DIAGNOSIS — E1140 Type 2 diabetes mellitus with diabetic neuropathy, unspecified: Secondary | ICD-10-CM | POA: Diagnosis not present

## 2018-11-27 DIAGNOSIS — E039 Hypothyroidism, unspecified: Secondary | ICD-10-CM | POA: Diagnosis not present

## 2018-11-27 DIAGNOSIS — E1122 Type 2 diabetes mellitus with diabetic chronic kidney disease: Secondary | ICD-10-CM | POA: Diagnosis not present

## 2018-11-27 DIAGNOSIS — I10 Essential (primary) hypertension: Secondary | ICD-10-CM | POA: Diagnosis not present

## 2018-11-27 DIAGNOSIS — N183 Chronic kidney disease, stage 3 (moderate): Secondary | ICD-10-CM | POA: Diagnosis not present

## 2018-11-27 DIAGNOSIS — E1169 Type 2 diabetes mellitus with other specified complication: Secondary | ICD-10-CM | POA: Diagnosis not present

## 2018-11-27 DIAGNOSIS — E7800 Pure hypercholesterolemia, unspecified: Secondary | ICD-10-CM | POA: Diagnosis not present

## 2018-12-03 DIAGNOSIS — N189 Chronic kidney disease, unspecified: Secondary | ICD-10-CM | POA: Diagnosis not present

## 2018-12-03 DIAGNOSIS — N184 Chronic kidney disease, stage 4 (severe): Secondary | ICD-10-CM | POA: Diagnosis not present

## 2018-12-03 DIAGNOSIS — D631 Anemia in chronic kidney disease: Secondary | ICD-10-CM | POA: Diagnosis not present

## 2018-12-03 DIAGNOSIS — I129 Hypertensive chronic kidney disease with stage 1 through stage 4 chronic kidney disease, or unspecified chronic kidney disease: Secondary | ICD-10-CM | POA: Diagnosis not present

## 2018-12-05 NOTE — Progress Notes (Signed)
Remote pacemaker transmission.   

## 2018-12-09 ENCOUNTER — Other Ambulatory Visit: Payer: Self-pay | Admitting: Family Medicine

## 2018-12-09 ENCOUNTER — Ambulatory Visit
Admission: RE | Admit: 2018-12-09 | Discharge: 2018-12-09 | Disposition: A | Discharge status: Home / Self Care | Payer: Medicare Other | Source: Ambulatory Visit | Attending: Family Medicine | Admitting: Family Medicine

## 2018-12-09 DIAGNOSIS — I480 Paroxysmal atrial fibrillation: Secondary | ICD-10-CM | POA: Diagnosis not present

## 2018-12-09 DIAGNOSIS — M25511 Pain in right shoulder: Secondary | ICD-10-CM | POA: Diagnosis not present

## 2018-12-09 DIAGNOSIS — N183 Chronic kidney disease, stage 3 unspecified: Secondary | ICD-10-CM | POA: Diagnosis not present

## 2018-12-09 DIAGNOSIS — Z466 Encounter for fitting and adjustment of urinary device: Secondary | ICD-10-CM | POA: Diagnosis not present

## 2018-12-09 DIAGNOSIS — R339 Retention of urine, unspecified: Secondary | ICD-10-CM | POA: Diagnosis not present

## 2018-12-09 DIAGNOSIS — E1122 Type 2 diabetes mellitus with diabetic chronic kidney disease: Secondary | ICD-10-CM | POA: Diagnosis not present

## 2018-12-09 DIAGNOSIS — N3946 Mixed incontinence: Secondary | ICD-10-CM | POA: Diagnosis not present

## 2018-12-09 DIAGNOSIS — Z7189 Other specified counseling: Secondary | ICD-10-CM | POA: Diagnosis not present

## 2018-12-09 DIAGNOSIS — I129 Hypertensive chronic kidney disease with stage 1 through stage 4 chronic kidney disease, or unspecified chronic kidney disease: Secondary | ICD-10-CM | POA: Diagnosis not present

## 2018-12-12 DIAGNOSIS — Z466 Encounter for fitting and adjustment of urinary device: Secondary | ICD-10-CM | POA: Diagnosis not present

## 2018-12-12 DIAGNOSIS — R339 Retention of urine, unspecified: Secondary | ICD-10-CM | POA: Diagnosis not present

## 2018-12-12 DIAGNOSIS — E1142 Type 2 diabetes mellitus with diabetic polyneuropathy: Secondary | ICD-10-CM | POA: Diagnosis not present

## 2018-12-12 DIAGNOSIS — I129 Hypertensive chronic kidney disease with stage 1 through stage 4 chronic kidney disease, or unspecified chronic kidney disease: Secondary | ICD-10-CM | POA: Diagnosis not present

## 2018-12-12 DIAGNOSIS — I480 Paroxysmal atrial fibrillation: Secondary | ICD-10-CM | POA: Diagnosis not present

## 2018-12-12 DIAGNOSIS — Z5181 Encounter for therapeutic drug level monitoring: Secondary | ICD-10-CM | POA: Diagnosis not present

## 2018-12-12 DIAGNOSIS — N3946 Mixed incontinence: Secondary | ICD-10-CM | POA: Diagnosis not present

## 2018-12-12 DIAGNOSIS — Z7901 Long term (current) use of anticoagulants: Secondary | ICD-10-CM | POA: Diagnosis not present

## 2018-12-12 DIAGNOSIS — Z7984 Long term (current) use of oral hypoglycemic drugs: Secondary | ICD-10-CM | POA: Diagnosis not present

## 2018-12-12 DIAGNOSIS — E1122 Type 2 diabetes mellitus with diabetic chronic kidney disease: Secondary | ICD-10-CM | POA: Diagnosis not present

## 2018-12-13 ENCOUNTER — Telehealth: Payer: Self-pay

## 2018-12-13 NOTE — Telephone Encounter (Signed)

## 2018-12-16 ENCOUNTER — Telehealth (INDEPENDENT_AMBULATORY_CARE_PROVIDER_SITE_OTHER): Payer: Medicare Other | Admitting: Internal Medicine

## 2018-12-16 ENCOUNTER — Encounter: Payer: Self-pay | Admitting: Internal Medicine

## 2018-12-16 ENCOUNTER — Other Ambulatory Visit: Payer: Self-pay

## 2018-12-16 VITALS — BP 143/70 | HR 79 | Ht 62.0 in | Wt 178.0 lb

## 2018-12-16 DIAGNOSIS — I10 Essential (primary) hypertension: Secondary | ICD-10-CM

## 2018-12-16 DIAGNOSIS — I480 Paroxysmal atrial fibrillation: Secondary | ICD-10-CM

## 2018-12-16 DIAGNOSIS — I442 Atrioventricular block, complete: Secondary | ICD-10-CM | POA: Diagnosis not present

## 2018-12-16 DIAGNOSIS — I495 Sick sinus syndrome: Secondary | ICD-10-CM

## 2018-12-16 NOTE — Progress Notes (Signed)
Electrophysiology TeleHealth Note  Due to national recommendations of social distancing due to [place] 19, an audio telehealth visit is felt to be most appropriate for this patient at this time.  Verbal consent was obtained by me for the telehealth visit today.  The patient does not have capability for a virtual visit.  A phone visit is therefore required today.   Date:  12/16/2018   ID:  Tiffany [name], DOB 05/22/1931, MRN [id number]  Location: patient's home  Provider location:  [facility]  Evaluation Performed: Follow-up visit  PCP:  Antony Contras, MD   Electrophysiologist:  Dr Rayann Heman  Chief Complaint:  palpitations  History of Present Illness:    Tiffany [name] is a 85 y.o. female who presents via telehealth conferencing today.  Since last being seen in our clinic, the patient reports doing very well.  Her primary complaint is with arthritis in her shoulders.  Today, she denies symptoms of palpitations, chest pain, shortness of breath,  lower extremity edema, dizziness, presyncope, or syncope.  The patient is otherwise without complaint today.  The patient denies symptoms of fevers, chills, cough, or new SOB worrisome for COVID 19.  Past Medical History:  Diagnosis Date  . Diverticulosis   . DM2 (diabetes mellitus, type 2) ([place])   . Esophageal reflux   . HTN (hypertension)   . Hyperlipidemia   . Hypothyroidism   . Microalbuminuria    last done in 1/07   . Paroxysmal atrial fibrillation (HCC)    and atrial tachycardia  . Stroke ([place]) 1999  . Tachycardia-bradycardia syndrome ([place])    s/p PPM  . Unspecified hypothyroidism 11/20/2013    Past Surgical History:  Procedure Laterality Date  . CHOLECYSTECTOMY    . COLONOSCOPY N/A 11/21/2013   Procedure: COLONOSCOPY;  Surgeon: Jeryl Columbia, MD;  Location: [facility] ENDOSCOPY;  Service: Endoscopy;  Laterality: N/A;  . HEMORRHOID SURGERY    . hysterectomy (other)    . PACEMAKER GENERATOR CHANGE  10/03/12   DMT  Advisa DR gen change by Dr Rayann Heman  . PACEMAKER GENERATOR CHANGE N/A 10/03/2012   Procedure: PACEMAKER GENERATOR CHANGE;  Surgeon: Thompson Grayer, MD;  Location: [facility] CATH LAB;  Service: Cardiovascular;  Laterality: N/A;  . permanent pacemaker  1999   updated 2006 (MDT) by Dr Verlon Setting; gen change 09-2012 by Dr Rayann Heman (MDT)    Current Outpatient Medications  Medication Sig Dispense Refill  . cephALEXin (KEFLEX) 500 MG capsule 2 caps po bid x 7 days 28 capsule 0  . diltiazem (CARDIZEM CD) 180 MG 24 hr capsule Take 1 capsule by mouth daily.    . FERGON 240 (27 FE) MG tablet Take 1 tablet by mouth every evening.   3  . furosemide (LASIX) 20 MG tablet Take 20 mg by mouth daily.    Marland Kitchen glipiZIDE (GLUCOTROL) 10 MG tablet Take 10 mg by mouth 2 (two) times daily before a meal.     . ipratropium (ATROVENT) 0.03 % nasal spray Place 1 spray into both nostrils every 12 (twelve) hours.    Marland Kitchen JANUVIA 25 MG tablet Take 25 mg by mouth daily at 12 noon.  3  . levothyroxine (SYNTHROID, LEVOTHROID) 50 MCG tablet Take 50 mcg by mouth daily before breakfast.     . MYRBETRIQ 25 MG TB24 tablet Take 25 tablets by mouth daily at 12 noon.  1  . ondansetron (ZOFRAN) 4 MG tablet Take 4 mg by mouth every 8 (eight) hours as needed for nausea or  vomiting.    . phenol (CHLORASEPTIC) 1.4 % LIQD Use as directed 1 spray in the mouth or throat 3 (three) times daily as needed for throat irritation / pain.    . potassium chloride SA (K-DUR,KLOR-CON) 20 MEQ tablet Take 20 mEq by mouth daily.    . ranitidine (ZANTAC) 150 MG capsule Take 150 mg by mouth every morning.     . rosuvastatin (CRESTOR) 10 MG tablet Take 10 mg by mouth daily.    . Semaglutide (OZEMPIC) 0.25 or 0.5 MG/DOSE SOPN Inject 0.25 mg into the skin once a week.    . warfarin (COUMADIN) 2 MG tablet Take 2 mg by mouth daily.    [place] HFA 45 MCG/ACT inhaler Inhale 2 puffs into the lungs daily as needed for shortness of breath or wheezing.     No current facility-administered  medications for this visit.     Allergies:   Patient has no known allergies.   Social History:  The patient  reports that she has never smoked. She has never used smokeless tobacco. She reports that she does not drink alcohol or use drugs.   Family History:  The patient's family history includes Diabetes in her father; Tuberculosis in her mother.   ROS:  Please see the history of present illness.   All other systems are personally reviewed and negative.    Exam:    Vital Signs:  BP (!) 143/70   Pulse 79   Ht 5\' 2"  (1.575 m)   Wt 178 lb (80.7 kg)   BMI 32.56 kg/m   Well sounding, alert and conversant   Labs/Other Tests and Data Reviewed:    Recent Labs: No results found for requested labs within last 8760 hours.   Wt Readings from Last 3 Encounters:  12/16/18 178 lb (80.7 kg)  12/10/17 173 lb 9.6 oz (78.7 kg)  11/18/17 178 lb (80.7 kg)     Last device remote is reviewed from [facility] PDF which reveals normal device function, no arrhythmias    ASSESSMENT & PLAN:    1.  Sinus bradycardia (atrial stand still) and complete heart block Normal pacemaker function by remotes Remotes are uptodate  2. HTN Stable No change required today  3. Paroxysmal atrial fibrillation afib burden is 0% for at least 2 years.  She is on coumadin for chads2vasc score of 6   Follow-up:  Continue remotes Follow-up with me in a year   Patient Risk:  after full review of this patients clinical status, I feel that they are at moderate risk at this time.  Today, I have spent 15 minutes with the patient with telehealth technology discussing arrhythmia management .    SignedThompson Grayer, MD  12/16/2018 2:22 PM     [address] [place] [address] [phone] (office) [phone] (fax)

## 2018-12-17 DIAGNOSIS — Z466 Encounter for fitting and adjustment of urinary device: Secondary | ICD-10-CM | POA: Diagnosis not present

## 2018-12-17 DIAGNOSIS — N3946 Mixed incontinence: Secondary | ICD-10-CM | POA: Diagnosis not present

## 2018-12-17 DIAGNOSIS — R339 Retention of urine, unspecified: Secondary | ICD-10-CM | POA: Diagnosis not present

## 2018-12-17 DIAGNOSIS — E1122 Type 2 diabetes mellitus with diabetic chronic kidney disease: Secondary | ICD-10-CM | POA: Diagnosis not present

## 2018-12-17 DIAGNOSIS — I480 Paroxysmal atrial fibrillation: Secondary | ICD-10-CM | POA: Diagnosis not present

## 2018-12-17 DIAGNOSIS — I129 Hypertensive chronic kidney disease with stage 1 through stage 4 chronic kidney disease, or unspecified chronic kidney disease: Secondary | ICD-10-CM | POA: Diagnosis not present

## 2018-12-25 DIAGNOSIS — M19211 Secondary osteoarthritis, right shoulder: Secondary | ICD-10-CM | POA: Diagnosis not present

## 2018-12-26 DIAGNOSIS — N184 Chronic kidney disease, stage 4 (severe): Secondary | ICD-10-CM | POA: Diagnosis not present

## 2018-12-26 DIAGNOSIS — R54 Age-related physical debility: Secondary | ICD-10-CM | POA: Diagnosis not present

## 2018-12-26 DIAGNOSIS — E7800 Pure hypercholesterolemia, unspecified: Secondary | ICD-10-CM | POA: Diagnosis not present

## 2018-12-26 DIAGNOSIS — E1122 Type 2 diabetes mellitus with diabetic chronic kidney disease: Secondary | ICD-10-CM | POA: Diagnosis not present

## 2018-12-26 DIAGNOSIS — E1140 Type 2 diabetes mellitus with diabetic neuropathy, unspecified: Secondary | ICD-10-CM | POA: Diagnosis not present

## 2018-12-26 DIAGNOSIS — I10 Essential (primary) hypertension: Secondary | ICD-10-CM | POA: Diagnosis not present

## 2018-12-26 DIAGNOSIS — I482 Chronic atrial fibrillation, unspecified: Secondary | ICD-10-CM | POA: Diagnosis not present

## 2018-12-26 DIAGNOSIS — I4891 Unspecified atrial fibrillation: Secondary | ICD-10-CM | POA: Diagnosis not present

## 2018-12-26 DIAGNOSIS — D509 Iron deficiency anemia, unspecified: Secondary | ICD-10-CM | POA: Diagnosis not present

## 2018-12-26 DIAGNOSIS — E1169 Type 2 diabetes mellitus with other specified complication: Secondary | ICD-10-CM | POA: Diagnosis not present

## 2018-12-26 DIAGNOSIS — E039 Hypothyroidism, unspecified: Secondary | ICD-10-CM | POA: Diagnosis not present

## 2018-12-26 DIAGNOSIS — N183 Chronic kidney disease, stage 3 unspecified: Secondary | ICD-10-CM | POA: Diagnosis not present

## 2019-01-07 DIAGNOSIS — I480 Paroxysmal atrial fibrillation: Secondary | ICD-10-CM | POA: Diagnosis not present

## 2019-01-07 DIAGNOSIS — E1122 Type 2 diabetes mellitus with diabetic chronic kidney disease: Secondary | ICD-10-CM | POA: Diagnosis not present

## 2019-01-07 DIAGNOSIS — I129 Hypertensive chronic kidney disease with stage 1 through stage 4 chronic kidney disease, or unspecified chronic kidney disease: Secondary | ICD-10-CM | POA: Diagnosis not present

## 2019-01-07 DIAGNOSIS — Z466 Encounter for fitting and adjustment of urinary device: Secondary | ICD-10-CM | POA: Diagnosis not present

## 2019-01-07 DIAGNOSIS — N3946 Mixed incontinence: Secondary | ICD-10-CM | POA: Diagnosis not present

## 2019-01-07 DIAGNOSIS — R339 Retention of urine, unspecified: Secondary | ICD-10-CM | POA: Diagnosis not present

## 2019-01-11 DIAGNOSIS — Z7984 Long term (current) use of oral hypoglycemic drugs: Secondary | ICD-10-CM | POA: Diagnosis not present

## 2019-01-11 DIAGNOSIS — Z7901 Long term (current) use of anticoagulants: Secondary | ICD-10-CM | POA: Diagnosis not present

## 2019-01-11 DIAGNOSIS — I480 Paroxysmal atrial fibrillation: Secondary | ICD-10-CM | POA: Diagnosis not present

## 2019-01-11 DIAGNOSIS — N3946 Mixed incontinence: Secondary | ICD-10-CM | POA: Diagnosis not present

## 2019-01-11 DIAGNOSIS — E1142 Type 2 diabetes mellitus with diabetic polyneuropathy: Secondary | ICD-10-CM | POA: Diagnosis not present

## 2019-01-11 DIAGNOSIS — I129 Hypertensive chronic kidney disease with stage 1 through stage 4 chronic kidney disease, or unspecified chronic kidney disease: Secondary | ICD-10-CM | POA: Diagnosis not present

## 2019-01-11 DIAGNOSIS — E1122 Type 2 diabetes mellitus with diabetic chronic kidney disease: Secondary | ICD-10-CM | POA: Diagnosis not present

## 2019-01-11 DIAGNOSIS — Z466 Encounter for fitting and adjustment of urinary device: Secondary | ICD-10-CM | POA: Diagnosis not present

## 2019-01-11 DIAGNOSIS — R339 Retention of urine, unspecified: Secondary | ICD-10-CM | POA: Diagnosis not present

## 2019-01-11 DIAGNOSIS — Z5181 Encounter for therapeutic drug level monitoring: Secondary | ICD-10-CM | POA: Diagnosis not present

## 2019-01-21 DIAGNOSIS — E7800 Pure hypercholesterolemia, unspecified: Secondary | ICD-10-CM | POA: Diagnosis not present

## 2019-01-21 DIAGNOSIS — R54 Age-related physical debility: Secondary | ICD-10-CM | POA: Diagnosis not present

## 2019-01-21 DIAGNOSIS — I482 Chronic atrial fibrillation, unspecified: Secondary | ICD-10-CM | POA: Diagnosis not present

## 2019-01-21 DIAGNOSIS — E039 Hypothyroidism, unspecified: Secondary | ICD-10-CM | POA: Diagnosis not present

## 2019-01-21 DIAGNOSIS — E1122 Type 2 diabetes mellitus with diabetic chronic kidney disease: Secondary | ICD-10-CM | POA: Diagnosis not present

## 2019-01-21 DIAGNOSIS — N183 Chronic kidney disease, stage 3 unspecified: Secondary | ICD-10-CM | POA: Diagnosis not present

## 2019-01-21 DIAGNOSIS — N184 Chronic kidney disease, stage 4 (severe): Secondary | ICD-10-CM | POA: Diagnosis not present

## 2019-01-21 DIAGNOSIS — E1140 Type 2 diabetes mellitus with diabetic neuropathy, unspecified: Secondary | ICD-10-CM | POA: Diagnosis not present

## 2019-01-21 DIAGNOSIS — I10 Essential (primary) hypertension: Secondary | ICD-10-CM | POA: Diagnosis not present

## 2019-01-21 DIAGNOSIS — E1169 Type 2 diabetes mellitus with other specified complication: Secondary | ICD-10-CM | POA: Diagnosis not present

## 2019-01-21 DIAGNOSIS — D509 Iron deficiency anemia, unspecified: Secondary | ICD-10-CM | POA: Diagnosis not present

## 2019-01-21 DIAGNOSIS — I4891 Unspecified atrial fibrillation: Secondary | ICD-10-CM | POA: Diagnosis not present

## 2019-02-04 DIAGNOSIS — N3946 Mixed incontinence: Secondary | ICD-10-CM | POA: Diagnosis not present

## 2019-02-04 DIAGNOSIS — I129 Hypertensive chronic kidney disease with stage 1 through stage 4 chronic kidney disease, or unspecified chronic kidney disease: Secondary | ICD-10-CM | POA: Diagnosis not present

## 2019-02-04 DIAGNOSIS — I480 Paroxysmal atrial fibrillation: Secondary | ICD-10-CM | POA: Diagnosis not present

## 2019-02-04 DIAGNOSIS — Z466 Encounter for fitting and adjustment of urinary device: Secondary | ICD-10-CM | POA: Diagnosis not present

## 2019-02-04 DIAGNOSIS — E1122 Type 2 diabetes mellitus with diabetic chronic kidney disease: Secondary | ICD-10-CM | POA: Diagnosis not present

## 2019-02-04 DIAGNOSIS — R339 Retention of urine, unspecified: Secondary | ICD-10-CM | POA: Diagnosis not present

## 2019-02-10 DIAGNOSIS — I129 Hypertensive chronic kidney disease with stage 1 through stage 4 chronic kidney disease, or unspecified chronic kidney disease: Secondary | ICD-10-CM | POA: Diagnosis not present

## 2019-02-10 DIAGNOSIS — R339 Retention of urine, unspecified: Secondary | ICD-10-CM | POA: Diagnosis not present

## 2019-02-10 DIAGNOSIS — E1122 Type 2 diabetes mellitus with diabetic chronic kidney disease: Secondary | ICD-10-CM | POA: Diagnosis not present

## 2019-02-10 DIAGNOSIS — Z466 Encounter for fitting and adjustment of urinary device: Secondary | ICD-10-CM | POA: Diagnosis not present

## 2019-02-10 DIAGNOSIS — Z5181 Encounter for therapeutic drug level monitoring: Secondary | ICD-10-CM | POA: Diagnosis not present

## 2019-02-10 DIAGNOSIS — E1142 Type 2 diabetes mellitus with diabetic polyneuropathy: Secondary | ICD-10-CM | POA: Diagnosis not present

## 2019-02-10 DIAGNOSIS — I480 Paroxysmal atrial fibrillation: Secondary | ICD-10-CM | POA: Diagnosis not present

## 2019-02-10 DIAGNOSIS — Z7901 Long term (current) use of anticoagulants: Secondary | ICD-10-CM | POA: Diagnosis not present

## 2019-02-10 DIAGNOSIS — Z7984 Long term (current) use of oral hypoglycemic drugs: Secondary | ICD-10-CM | POA: Diagnosis not present

## 2019-02-10 DIAGNOSIS — N3946 Mixed incontinence: Secondary | ICD-10-CM | POA: Diagnosis not present

## 2019-02-25 ENCOUNTER — Ambulatory Visit (INDEPENDENT_AMBULATORY_CARE_PROVIDER_SITE_OTHER): Payer: Medicare Other | Admitting: *Deleted

## 2019-02-25 ENCOUNTER — Telehealth: Payer: Self-pay | Admitting: Internal Medicine

## 2019-02-25 DIAGNOSIS — I495 Sick sinus syndrome: Secondary | ICD-10-CM | POA: Diagnosis not present

## 2019-02-25 LAB — CUP PACEART REMOTE DEVICE CHECK
Battery Remaining Longevity: 12 mo
Battery Voltage: 2.89 V
Brady Statistic AP VP Percent: 99.98 %
Brady Statistic AP VS Percent: 0.01 %
Brady Statistic AS VP Percent: 0 %
Brady Statistic AS VS Percent: 0 %
Brady Statistic RA Percent Paced: 100 %
Brady Statistic RV Percent Paced: 99.92 %
Date Time Interrogation Session: 20201229095716
Implantable Lead Implant Date: 19990907
Implantable Lead Implant Date: 19990907
Implantable Lead Location: 753859
Implantable Lead Location: 753860
Implantable Lead Serial Number: 32904
Implantable Lead Serial Number: 33421
Implantable Pulse Generator Implant Date: 20140807
Lead Channel Impedance Value: 266 Ohm
Lead Channel Impedance Value: 266 Ohm
Lead Channel Impedance Value: 304 Ohm
Lead Channel Impedance Value: 323 Ohm
Lead Channel Pacing Threshold Amplitude: 1 V
Lead Channel Pacing Threshold Amplitude: 1.25 V
Lead Channel Pacing Threshold Pulse Width: 0.4 ms
Lead Channel Pacing Threshold Pulse Width: 0.4 ms
Lead Channel Sensing Intrinsic Amplitude: 0.25 mV
Lead Channel Sensing Intrinsic Amplitude: 0.25 mV
Lead Channel Sensing Intrinsic Amplitude: 11 mV
Lead Channel Sensing Intrinsic Amplitude: 11 mV
Lead Channel Setting Pacing Amplitude: 2 V
Lead Channel Setting Pacing Amplitude: 2.5 V
Lead Channel Setting Pacing Pulse Width: 0.4 ms
Lead Channel Setting Sensing Sensitivity: 8 mV

## 2019-02-25 NOTE — Telephone Encounter (Signed)
New Message  Patient is calling in to get assistance with sending a transmission. Called the device clinic number twice, no answer. Please give patient a call back to assist.

## 2019-02-25 NOTE — Telephone Encounter (Signed)
I let the pt know we did receive her transmission this morning.

## 2019-02-27 DIAGNOSIS — E039 Hypothyroidism, unspecified: Secondary | ICD-10-CM | POA: Diagnosis not present

## 2019-02-27 DIAGNOSIS — E1169 Type 2 diabetes mellitus with other specified complication: Secondary | ICD-10-CM | POA: Diagnosis not present

## 2019-02-27 DIAGNOSIS — E1122 Type 2 diabetes mellitus with diabetic chronic kidney disease: Secondary | ICD-10-CM | POA: Diagnosis not present

## 2019-02-27 DIAGNOSIS — D509 Iron deficiency anemia, unspecified: Secondary | ICD-10-CM | POA: Diagnosis not present

## 2019-02-27 DIAGNOSIS — E1140 Type 2 diabetes mellitus with diabetic neuropathy, unspecified: Secondary | ICD-10-CM | POA: Diagnosis not present

## 2019-02-27 DIAGNOSIS — I482 Chronic atrial fibrillation, unspecified: Secondary | ICD-10-CM | POA: Diagnosis not present

## 2019-02-27 DIAGNOSIS — E7800 Pure hypercholesterolemia, unspecified: Secondary | ICD-10-CM | POA: Diagnosis not present

## 2019-02-27 DIAGNOSIS — R54 Age-related physical debility: Secondary | ICD-10-CM | POA: Diagnosis not present

## 2019-02-27 DIAGNOSIS — I4891 Unspecified atrial fibrillation: Secondary | ICD-10-CM | POA: Diagnosis not present

## 2019-02-27 DIAGNOSIS — I10 Essential (primary) hypertension: Secondary | ICD-10-CM | POA: Diagnosis not present

## 2019-02-27 DIAGNOSIS — N184 Chronic kidney disease, stage 4 (severe): Secondary | ICD-10-CM | POA: Diagnosis not present

## 2019-02-27 DIAGNOSIS — N183 Chronic kidney disease, stage 3 unspecified: Secondary | ICD-10-CM | POA: Diagnosis not present

## 2019-03-04 DIAGNOSIS — E1122 Type 2 diabetes mellitus with diabetic chronic kidney disease: Secondary | ICD-10-CM | POA: Diagnosis not present

## 2019-03-04 DIAGNOSIS — N3946 Mixed incontinence: Secondary | ICD-10-CM | POA: Diagnosis not present

## 2019-03-04 DIAGNOSIS — I129 Hypertensive chronic kidney disease with stage 1 through stage 4 chronic kidney disease, or unspecified chronic kidney disease: Secondary | ICD-10-CM | POA: Diagnosis not present

## 2019-03-04 DIAGNOSIS — R339 Retention of urine, unspecified: Secondary | ICD-10-CM | POA: Diagnosis not present

## 2019-03-04 DIAGNOSIS — Z466 Encounter for fitting and adjustment of urinary device: Secondary | ICD-10-CM | POA: Diagnosis not present

## 2019-03-04 DIAGNOSIS — I480 Paroxysmal atrial fibrillation: Secondary | ICD-10-CM | POA: Diagnosis not present

## 2019-03-12 DIAGNOSIS — Z466 Encounter for fitting and adjustment of urinary device: Secondary | ICD-10-CM | POA: Diagnosis not present

## 2019-03-12 DIAGNOSIS — R339 Retention of urine, unspecified: Secondary | ICD-10-CM | POA: Diagnosis not present

## 2019-03-12 DIAGNOSIS — Z7984 Long term (current) use of oral hypoglycemic drugs: Secondary | ICD-10-CM | POA: Diagnosis not present

## 2019-03-12 DIAGNOSIS — Z5181 Encounter for therapeutic drug level monitoring: Secondary | ICD-10-CM | POA: Diagnosis not present

## 2019-03-12 DIAGNOSIS — I129 Hypertensive chronic kidney disease with stage 1 through stage 4 chronic kidney disease, or unspecified chronic kidney disease: Secondary | ICD-10-CM | POA: Diagnosis not present

## 2019-03-12 DIAGNOSIS — E1142 Type 2 diabetes mellitus with diabetic polyneuropathy: Secondary | ICD-10-CM | POA: Diagnosis not present

## 2019-03-12 DIAGNOSIS — Z7901 Long term (current) use of anticoagulants: Secondary | ICD-10-CM | POA: Diagnosis not present

## 2019-03-12 DIAGNOSIS — E1122 Type 2 diabetes mellitus with diabetic chronic kidney disease: Secondary | ICD-10-CM | POA: Diagnosis not present

## 2019-03-12 DIAGNOSIS — I480 Paroxysmal atrial fibrillation: Secondary | ICD-10-CM | POA: Diagnosis not present

## 2019-03-12 DIAGNOSIS — N3946 Mixed incontinence: Secondary | ICD-10-CM | POA: Diagnosis not present

## 2019-03-27 DIAGNOSIS — E7800 Pure hypercholesterolemia, unspecified: Secondary | ICD-10-CM | POA: Diagnosis not present

## 2019-03-27 DIAGNOSIS — R54 Age-related physical debility: Secondary | ICD-10-CM | POA: Diagnosis not present

## 2019-03-27 DIAGNOSIS — I482 Chronic atrial fibrillation, unspecified: Secondary | ICD-10-CM | POA: Diagnosis not present

## 2019-03-27 DIAGNOSIS — E1140 Type 2 diabetes mellitus with diabetic neuropathy, unspecified: Secondary | ICD-10-CM | POA: Diagnosis not present

## 2019-03-27 DIAGNOSIS — I10 Essential (primary) hypertension: Secondary | ICD-10-CM | POA: Diagnosis not present

## 2019-03-27 DIAGNOSIS — I4891 Unspecified atrial fibrillation: Secondary | ICD-10-CM | POA: Diagnosis not present

## 2019-03-27 DIAGNOSIS — E1122 Type 2 diabetes mellitus with diabetic chronic kidney disease: Secondary | ICD-10-CM | POA: Diagnosis not present

## 2019-03-27 DIAGNOSIS — E039 Hypothyroidism, unspecified: Secondary | ICD-10-CM | POA: Diagnosis not present

## 2019-03-27 DIAGNOSIS — N184 Chronic kidney disease, stage 4 (severe): Secondary | ICD-10-CM | POA: Diagnosis not present

## 2019-03-27 DIAGNOSIS — E1169 Type 2 diabetes mellitus with other specified complication: Secondary | ICD-10-CM | POA: Diagnosis not present

## 2019-03-27 DIAGNOSIS — D509 Iron deficiency anemia, unspecified: Secondary | ICD-10-CM | POA: Diagnosis not present

## 2019-04-02 DIAGNOSIS — N184 Chronic kidney disease, stage 4 (severe): Secondary | ICD-10-CM | POA: Diagnosis not present

## 2019-04-02 DIAGNOSIS — I129 Hypertensive chronic kidney disease with stage 1 through stage 4 chronic kidney disease, or unspecified chronic kidney disease: Secondary | ICD-10-CM | POA: Diagnosis not present

## 2019-04-02 DIAGNOSIS — N2581 Secondary hyperparathyroidism of renal origin: Secondary | ICD-10-CM | POA: Diagnosis not present

## 2019-04-02 DIAGNOSIS — D631 Anemia in chronic kidney disease: Secondary | ICD-10-CM | POA: Diagnosis not present

## 2019-04-02 DIAGNOSIS — N189 Chronic kidney disease, unspecified: Secondary | ICD-10-CM | POA: Diagnosis not present

## 2019-04-04 DIAGNOSIS — I480 Paroxysmal atrial fibrillation: Secondary | ICD-10-CM | POA: Diagnosis not present

## 2019-04-04 DIAGNOSIS — N3946 Mixed incontinence: Secondary | ICD-10-CM | POA: Diagnosis not present

## 2019-04-04 DIAGNOSIS — R339 Retention of urine, unspecified: Secondary | ICD-10-CM | POA: Diagnosis not present

## 2019-04-04 DIAGNOSIS — Z466 Encounter for fitting and adjustment of urinary device: Secondary | ICD-10-CM | POA: Diagnosis not present

## 2019-04-04 DIAGNOSIS — E1122 Type 2 diabetes mellitus with diabetic chronic kidney disease: Secondary | ICD-10-CM | POA: Diagnosis not present

## 2019-04-04 DIAGNOSIS — I129 Hypertensive chronic kidney disease with stage 1 through stage 4 chronic kidney disease, or unspecified chronic kidney disease: Secondary | ICD-10-CM | POA: Diagnosis not present

## 2019-04-08 DIAGNOSIS — I129 Hypertensive chronic kidney disease with stage 1 through stage 4 chronic kidney disease, or unspecified chronic kidney disease: Secondary | ICD-10-CM | POA: Diagnosis not present

## 2019-04-08 DIAGNOSIS — R339 Retention of urine, unspecified: Secondary | ICD-10-CM | POA: Diagnosis not present

## 2019-04-08 DIAGNOSIS — Z466 Encounter for fitting and adjustment of urinary device: Secondary | ICD-10-CM | POA: Diagnosis not present

## 2019-04-08 DIAGNOSIS — N3946 Mixed incontinence: Secondary | ICD-10-CM | POA: Diagnosis not present

## 2019-04-08 DIAGNOSIS — E1122 Type 2 diabetes mellitus with diabetic chronic kidney disease: Secondary | ICD-10-CM | POA: Diagnosis not present

## 2019-04-08 DIAGNOSIS — I480 Paroxysmal atrial fibrillation: Secondary | ICD-10-CM | POA: Diagnosis not present

## 2019-04-11 DIAGNOSIS — N3946 Mixed incontinence: Secondary | ICD-10-CM | POA: Diagnosis not present

## 2019-04-11 DIAGNOSIS — I480 Paroxysmal atrial fibrillation: Secondary | ICD-10-CM | POA: Diagnosis not present

## 2019-04-11 DIAGNOSIS — Z5181 Encounter for therapeutic drug level monitoring: Secondary | ICD-10-CM | POA: Diagnosis not present

## 2019-04-11 DIAGNOSIS — Z466 Encounter for fitting and adjustment of urinary device: Secondary | ICD-10-CM | POA: Diagnosis not present

## 2019-04-11 DIAGNOSIS — Z7901 Long term (current) use of anticoagulants: Secondary | ICD-10-CM | POA: Diagnosis not present

## 2019-04-11 DIAGNOSIS — E1142 Type 2 diabetes mellitus with diabetic polyneuropathy: Secondary | ICD-10-CM | POA: Diagnosis not present

## 2019-04-11 DIAGNOSIS — I129 Hypertensive chronic kidney disease with stage 1 through stage 4 chronic kidney disease, or unspecified chronic kidney disease: Secondary | ICD-10-CM | POA: Diagnosis not present

## 2019-04-11 DIAGNOSIS — E113393 Type 2 diabetes mellitus with moderate nonproliferative diabetic retinopathy without macular edema, bilateral: Secondary | ICD-10-CM | POA: Diagnosis not present

## 2019-04-11 DIAGNOSIS — E1122 Type 2 diabetes mellitus with diabetic chronic kidney disease: Secondary | ICD-10-CM | POA: Diagnosis not present

## 2019-04-11 DIAGNOSIS — R339 Retention of urine, unspecified: Secondary | ICD-10-CM | POA: Diagnosis not present

## 2019-04-11 DIAGNOSIS — Z7984 Long term (current) use of oral hypoglycemic drugs: Secondary | ICD-10-CM | POA: Diagnosis not present

## 2019-04-18 DIAGNOSIS — Z466 Encounter for fitting and adjustment of urinary device: Secondary | ICD-10-CM | POA: Diagnosis not present

## 2019-04-18 DIAGNOSIS — E1122 Type 2 diabetes mellitus with diabetic chronic kidney disease: Secondary | ICD-10-CM | POA: Diagnosis not present

## 2019-04-18 DIAGNOSIS — N3946 Mixed incontinence: Secondary | ICD-10-CM | POA: Diagnosis not present

## 2019-04-18 DIAGNOSIS — R339 Retention of urine, unspecified: Secondary | ICD-10-CM | POA: Diagnosis not present

## 2019-04-18 DIAGNOSIS — I480 Paroxysmal atrial fibrillation: Secondary | ICD-10-CM | POA: Diagnosis not present

## 2019-04-18 DIAGNOSIS — I129 Hypertensive chronic kidney disease with stage 1 through stage 4 chronic kidney disease, or unspecified chronic kidney disease: Secondary | ICD-10-CM | POA: Diagnosis not present

## 2019-04-25 DIAGNOSIS — E7800 Pure hypercholesterolemia, unspecified: Secondary | ICD-10-CM | POA: Diagnosis not present

## 2019-04-25 DIAGNOSIS — E039 Hypothyroidism, unspecified: Secondary | ICD-10-CM | POA: Diagnosis not present

## 2019-04-25 DIAGNOSIS — I10 Essential (primary) hypertension: Secondary | ICD-10-CM | POA: Diagnosis not present

## 2019-04-25 DIAGNOSIS — N183 Chronic kidney disease, stage 3 unspecified: Secondary | ICD-10-CM | POA: Diagnosis not present

## 2019-04-25 DIAGNOSIS — D509 Iron deficiency anemia, unspecified: Secondary | ICD-10-CM | POA: Diagnosis not present

## 2019-04-25 DIAGNOSIS — R54 Age-related physical debility: Secondary | ICD-10-CM | POA: Diagnosis not present

## 2019-04-25 DIAGNOSIS — E1122 Type 2 diabetes mellitus with diabetic chronic kidney disease: Secondary | ICD-10-CM | POA: Diagnosis not present

## 2019-04-25 DIAGNOSIS — I4891 Unspecified atrial fibrillation: Secondary | ICD-10-CM | POA: Diagnosis not present

## 2019-04-25 DIAGNOSIS — I482 Chronic atrial fibrillation, unspecified: Secondary | ICD-10-CM | POA: Diagnosis not present

## 2019-04-25 DIAGNOSIS — E1140 Type 2 diabetes mellitus with diabetic neuropathy, unspecified: Secondary | ICD-10-CM | POA: Diagnosis not present

## 2019-04-25 DIAGNOSIS — N184 Chronic kidney disease, stage 4 (severe): Secondary | ICD-10-CM | POA: Diagnosis not present

## 2019-04-25 DIAGNOSIS — E1169 Type 2 diabetes mellitus with other specified complication: Secondary | ICD-10-CM | POA: Diagnosis not present

## 2019-05-02 DIAGNOSIS — I480 Paroxysmal atrial fibrillation: Secondary | ICD-10-CM | POA: Diagnosis not present

## 2019-05-02 DIAGNOSIS — Z466 Encounter for fitting and adjustment of urinary device: Secondary | ICD-10-CM | POA: Diagnosis not present

## 2019-05-02 DIAGNOSIS — N3946 Mixed incontinence: Secondary | ICD-10-CM | POA: Diagnosis not present

## 2019-05-02 DIAGNOSIS — R339 Retention of urine, unspecified: Secondary | ICD-10-CM | POA: Diagnosis not present

## 2019-05-02 DIAGNOSIS — I129 Hypertensive chronic kidney disease with stage 1 through stage 4 chronic kidney disease, or unspecified chronic kidney disease: Secondary | ICD-10-CM | POA: Diagnosis not present

## 2019-05-02 DIAGNOSIS — E1122 Type 2 diabetes mellitus with diabetic chronic kidney disease: Secondary | ICD-10-CM | POA: Diagnosis not present

## 2019-05-11 DIAGNOSIS — I129 Hypertensive chronic kidney disease with stage 1 through stage 4 chronic kidney disease, or unspecified chronic kidney disease: Secondary | ICD-10-CM | POA: Diagnosis not present

## 2019-05-11 DIAGNOSIS — Z7984 Long term (current) use of oral hypoglycemic drugs: Secondary | ICD-10-CM | POA: Diagnosis not present

## 2019-05-11 DIAGNOSIS — R339 Retention of urine, unspecified: Secondary | ICD-10-CM | POA: Diagnosis not present

## 2019-05-11 DIAGNOSIS — E1122 Type 2 diabetes mellitus with diabetic chronic kidney disease: Secondary | ICD-10-CM | POA: Diagnosis not present

## 2019-05-11 DIAGNOSIS — Z5181 Encounter for therapeutic drug level monitoring: Secondary | ICD-10-CM | POA: Diagnosis not present

## 2019-05-11 DIAGNOSIS — E1142 Type 2 diabetes mellitus with diabetic polyneuropathy: Secondary | ICD-10-CM | POA: Diagnosis not present

## 2019-05-11 DIAGNOSIS — N3946 Mixed incontinence: Secondary | ICD-10-CM | POA: Diagnosis not present

## 2019-05-11 DIAGNOSIS — I480 Paroxysmal atrial fibrillation: Secondary | ICD-10-CM | POA: Diagnosis not present

## 2019-05-11 DIAGNOSIS — Z7901 Long term (current) use of anticoagulants: Secondary | ICD-10-CM | POA: Diagnosis not present

## 2019-05-11 DIAGNOSIS — Z466 Encounter for fitting and adjustment of urinary device: Secondary | ICD-10-CM | POA: Diagnosis not present

## 2019-05-16 DIAGNOSIS — E1122 Type 2 diabetes mellitus with diabetic chronic kidney disease: Secondary | ICD-10-CM | POA: Diagnosis not present

## 2019-05-16 DIAGNOSIS — I129 Hypertensive chronic kidney disease with stage 1 through stage 4 chronic kidney disease, or unspecified chronic kidney disease: Secondary | ICD-10-CM | POA: Diagnosis not present

## 2019-05-16 DIAGNOSIS — N3946 Mixed incontinence: Secondary | ICD-10-CM | POA: Diagnosis not present

## 2019-05-16 DIAGNOSIS — I480 Paroxysmal atrial fibrillation: Secondary | ICD-10-CM | POA: Diagnosis not present

## 2019-05-16 DIAGNOSIS — R339 Retention of urine, unspecified: Secondary | ICD-10-CM | POA: Diagnosis not present

## 2019-05-16 DIAGNOSIS — Z466 Encounter for fitting and adjustment of urinary device: Secondary | ICD-10-CM | POA: Diagnosis not present

## 2019-05-19 DIAGNOSIS — Z7984 Long term (current) use of oral hypoglycemic drugs: Secondary | ICD-10-CM | POA: Diagnosis not present

## 2019-05-19 DIAGNOSIS — N3946 Mixed incontinence: Secondary | ICD-10-CM | POA: Diagnosis not present

## 2019-05-19 DIAGNOSIS — R609 Edema, unspecified: Secondary | ICD-10-CM | POA: Diagnosis not present

## 2019-05-19 DIAGNOSIS — E039 Hypothyroidism, unspecified: Secondary | ICD-10-CM | POA: Diagnosis not present

## 2019-05-19 DIAGNOSIS — I129 Hypertensive chronic kidney disease with stage 1 through stage 4 chronic kidney disease, or unspecified chronic kidney disease: Secondary | ICD-10-CM | POA: Diagnosis not present

## 2019-05-19 DIAGNOSIS — I480 Paroxysmal atrial fibrillation: Secondary | ICD-10-CM | POA: Diagnosis not present

## 2019-05-19 DIAGNOSIS — Z85828 Personal history of other malignant neoplasm of skin: Secondary | ICD-10-CM | POA: Diagnosis not present

## 2019-05-19 DIAGNOSIS — K219 Gastro-esophageal reflux disease without esophagitis: Secondary | ICD-10-CM | POA: Diagnosis not present

## 2019-05-19 DIAGNOSIS — E1169 Type 2 diabetes mellitus with other specified complication: Secondary | ICD-10-CM | POA: Diagnosis not present

## 2019-05-19 DIAGNOSIS — E7800 Pure hypercholesterolemia, unspecified: Secondary | ICD-10-CM | POA: Diagnosis not present

## 2019-05-19 DIAGNOSIS — N183 Chronic kidney disease, stage 3 unspecified: Secondary | ICD-10-CM | POA: Diagnosis not present

## 2019-05-27 ENCOUNTER — Ambulatory Visit (INDEPENDENT_AMBULATORY_CARE_PROVIDER_SITE_OTHER): Payer: Medicare Other | Admitting: *Deleted

## 2019-05-27 ENCOUNTER — Telehealth: Payer: Self-pay | Admitting: Internal Medicine

## 2019-05-27 DIAGNOSIS — E1169 Type 2 diabetes mellitus with other specified complication: Secondary | ICD-10-CM | POA: Diagnosis not present

## 2019-05-27 DIAGNOSIS — E039 Hypothyroidism, unspecified: Secondary | ICD-10-CM | POA: Diagnosis not present

## 2019-05-27 DIAGNOSIS — D509 Iron deficiency anemia, unspecified: Secondary | ICD-10-CM | POA: Diagnosis not present

## 2019-05-27 DIAGNOSIS — E1122 Type 2 diabetes mellitus with diabetic chronic kidney disease: Secondary | ICD-10-CM | POA: Diagnosis not present

## 2019-05-27 DIAGNOSIS — N184 Chronic kidney disease, stage 4 (severe): Secondary | ICD-10-CM | POA: Diagnosis not present

## 2019-05-27 DIAGNOSIS — R54 Age-related physical debility: Secondary | ICD-10-CM | POA: Diagnosis not present

## 2019-05-27 DIAGNOSIS — N183 Chronic kidney disease, stage 3 unspecified: Secondary | ICD-10-CM | POA: Diagnosis not present

## 2019-05-27 DIAGNOSIS — I10 Essential (primary) hypertension: Secondary | ICD-10-CM | POA: Diagnosis not present

## 2019-05-27 DIAGNOSIS — I495 Sick sinus syndrome: Secondary | ICD-10-CM

## 2019-05-27 DIAGNOSIS — E7800 Pure hypercholesterolemia, unspecified: Secondary | ICD-10-CM | POA: Diagnosis not present

## 2019-05-27 DIAGNOSIS — E1140 Type 2 diabetes mellitus with diabetic neuropathy, unspecified: Secondary | ICD-10-CM | POA: Diagnosis not present

## 2019-05-27 DIAGNOSIS — I4891 Unspecified atrial fibrillation: Secondary | ICD-10-CM | POA: Diagnosis not present

## 2019-05-27 DIAGNOSIS — I482 Chronic atrial fibrillation, unspecified: Secondary | ICD-10-CM | POA: Diagnosis not present

## 2019-05-27 LAB — CUP PACEART REMOTE DEVICE CHECK
Battery Remaining Longevity: 9 mo
Battery Voltage: 2.87 V
Brady Statistic AP VP Percent: 99.99 %
Brady Statistic AP VS Percent: 0.01 %
Brady Statistic AS VP Percent: 0 %
Brady Statistic AS VS Percent: 0 %
Brady Statistic RA Percent Paced: 100 %
Brady Statistic RV Percent Paced: 99.79 %
Date Time Interrogation Session: 20210330094402
Implantable Lead Implant Date: 19990907
Implantable Lead Implant Date: 19990907
Implantable Lead Location: 753859
Implantable Lead Location: 753860
Implantable Lead Serial Number: 32904
Implantable Lead Serial Number: 33421
Implantable Pulse Generator Implant Date: 20140807
Lead Channel Impedance Value: 266 Ohm
Lead Channel Impedance Value: 266 Ohm
Lead Channel Impedance Value: 304 Ohm
Lead Channel Impedance Value: 323 Ohm
Lead Channel Pacing Threshold Amplitude: 1 V
Lead Channel Pacing Threshold Amplitude: 1.25 V
Lead Channel Pacing Threshold Pulse Width: 0.4 ms
Lead Channel Pacing Threshold Pulse Width: 0.4 ms
Lead Channel Sensing Intrinsic Amplitude: 0.25 mV
Lead Channel Sensing Intrinsic Amplitude: 0.25 mV
Lead Channel Sensing Intrinsic Amplitude: 11 mV
Lead Channel Sensing Intrinsic Amplitude: 11 mV
Lead Channel Setting Pacing Amplitude: 2 V
Lead Channel Setting Pacing Amplitude: 2.5 V
Lead Channel Setting Pacing Pulse Width: 0.4 ms
Lead Channel Setting Sensing Sensitivity: 8 mV

## 2019-05-27 NOTE — Telephone Encounter (Signed)
New Message  Patient is calling in needing assistance with sending a transmission in. Please give patient a call back to assist.

## 2019-05-27 NOTE — Progress Notes (Signed)
PPM remote 

## 2019-05-28 NOTE — Telephone Encounter (Signed)
Transmission received.

## 2019-05-30 DIAGNOSIS — R339 Retention of urine, unspecified: Secondary | ICD-10-CM | POA: Diagnosis not present

## 2019-05-30 DIAGNOSIS — E1122 Type 2 diabetes mellitus with diabetic chronic kidney disease: Secondary | ICD-10-CM | POA: Diagnosis not present

## 2019-05-30 DIAGNOSIS — Z466 Encounter for fitting and adjustment of urinary device: Secondary | ICD-10-CM | POA: Diagnosis not present

## 2019-05-30 DIAGNOSIS — I480 Paroxysmal atrial fibrillation: Secondary | ICD-10-CM | POA: Diagnosis not present

## 2019-05-30 DIAGNOSIS — I129 Hypertensive chronic kidney disease with stage 1 through stage 4 chronic kidney disease, or unspecified chronic kidney disease: Secondary | ICD-10-CM | POA: Diagnosis not present

## 2019-05-30 DIAGNOSIS — N3946 Mixed incontinence: Secondary | ICD-10-CM | POA: Diagnosis not present

## 2019-06-09 DIAGNOSIS — I129 Hypertensive chronic kidney disease with stage 1 through stage 4 chronic kidney disease, or unspecified chronic kidney disease: Secondary | ICD-10-CM | POA: Diagnosis not present

## 2019-06-09 DIAGNOSIS — E1122 Type 2 diabetes mellitus with diabetic chronic kidney disease: Secondary | ICD-10-CM | POA: Diagnosis not present

## 2019-06-09 DIAGNOSIS — R339 Retention of urine, unspecified: Secondary | ICD-10-CM | POA: Diagnosis not present

## 2019-06-09 DIAGNOSIS — N3946 Mixed incontinence: Secondary | ICD-10-CM | POA: Diagnosis not present

## 2019-06-09 DIAGNOSIS — Z466 Encounter for fitting and adjustment of urinary device: Secondary | ICD-10-CM | POA: Diagnosis not present

## 2019-06-09 DIAGNOSIS — I480 Paroxysmal atrial fibrillation: Secondary | ICD-10-CM | POA: Diagnosis not present

## 2019-06-10 DIAGNOSIS — Z5181 Encounter for therapeutic drug level monitoring: Secondary | ICD-10-CM | POA: Diagnosis not present

## 2019-06-10 DIAGNOSIS — Z466 Encounter for fitting and adjustment of urinary device: Secondary | ICD-10-CM | POA: Diagnosis not present

## 2019-06-10 DIAGNOSIS — R339 Retention of urine, unspecified: Secondary | ICD-10-CM | POA: Diagnosis not present

## 2019-06-10 DIAGNOSIS — Z7901 Long term (current) use of anticoagulants: Secondary | ICD-10-CM | POA: Diagnosis not present

## 2019-06-10 DIAGNOSIS — N3946 Mixed incontinence: Secondary | ICD-10-CM | POA: Diagnosis not present

## 2019-06-10 DIAGNOSIS — I129 Hypertensive chronic kidney disease with stage 1 through stage 4 chronic kidney disease, or unspecified chronic kidney disease: Secondary | ICD-10-CM | POA: Diagnosis not present

## 2019-06-10 DIAGNOSIS — Z7984 Long term (current) use of oral hypoglycemic drugs: Secondary | ICD-10-CM | POA: Diagnosis not present

## 2019-06-10 DIAGNOSIS — E1122 Type 2 diabetes mellitus with diabetic chronic kidney disease: Secondary | ICD-10-CM | POA: Diagnosis not present

## 2019-06-10 DIAGNOSIS — E1142 Type 2 diabetes mellitus with diabetic polyneuropathy: Secondary | ICD-10-CM | POA: Diagnosis not present

## 2019-06-10 DIAGNOSIS — I480 Paroxysmal atrial fibrillation: Secondary | ICD-10-CM | POA: Diagnosis not present

## 2019-06-13 DIAGNOSIS — N3946 Mixed incontinence: Secondary | ICD-10-CM | POA: Diagnosis not present

## 2019-06-13 DIAGNOSIS — I480 Paroxysmal atrial fibrillation: Secondary | ICD-10-CM | POA: Diagnosis not present

## 2019-06-13 DIAGNOSIS — Z466 Encounter for fitting and adjustment of urinary device: Secondary | ICD-10-CM | POA: Diagnosis not present

## 2019-06-13 DIAGNOSIS — E1122 Type 2 diabetes mellitus with diabetic chronic kidney disease: Secondary | ICD-10-CM | POA: Diagnosis not present

## 2019-06-13 DIAGNOSIS — R339 Retention of urine, unspecified: Secondary | ICD-10-CM | POA: Diagnosis not present

## 2019-06-13 DIAGNOSIS — I129 Hypertensive chronic kidney disease with stage 1 through stage 4 chronic kidney disease, or unspecified chronic kidney disease: Secondary | ICD-10-CM | POA: Diagnosis not present

## 2019-06-17 DIAGNOSIS — I480 Paroxysmal atrial fibrillation: Secondary | ICD-10-CM | POA: Diagnosis not present

## 2019-06-17 DIAGNOSIS — E1165 Type 2 diabetes mellitus with hyperglycemia: Secondary | ICD-10-CM | POA: Diagnosis not present

## 2019-06-17 DIAGNOSIS — Z8673 Personal history of transient ischemic attack (TIA), and cerebral infarction without residual deficits: Secondary | ICD-10-CM | POA: Diagnosis not present

## 2019-06-17 DIAGNOSIS — N184 Chronic kidney disease, stage 4 (severe): Secondary | ICD-10-CM | POA: Diagnosis not present

## 2019-06-17 DIAGNOSIS — E039 Hypothyroidism, unspecified: Secondary | ICD-10-CM | POA: Diagnosis not present

## 2019-06-17 DIAGNOSIS — Z6830 Body mass index (BMI) 30.0-30.9, adult: Secondary | ICD-10-CM | POA: Diagnosis not present

## 2019-06-24 DIAGNOSIS — N184 Chronic kidney disease, stage 4 (severe): Secondary | ICD-10-CM | POA: Diagnosis not present

## 2019-06-24 DIAGNOSIS — I4891 Unspecified atrial fibrillation: Secondary | ICD-10-CM | POA: Diagnosis not present

## 2019-06-24 DIAGNOSIS — E1169 Type 2 diabetes mellitus with other specified complication: Secondary | ICD-10-CM | POA: Diagnosis not present

## 2019-06-24 DIAGNOSIS — E1122 Type 2 diabetes mellitus with diabetic chronic kidney disease: Secondary | ICD-10-CM | POA: Diagnosis not present

## 2019-06-24 DIAGNOSIS — E039 Hypothyroidism, unspecified: Secondary | ICD-10-CM | POA: Diagnosis not present

## 2019-06-24 DIAGNOSIS — N183 Chronic kidney disease, stage 3 unspecified: Secondary | ICD-10-CM | POA: Diagnosis not present

## 2019-06-24 DIAGNOSIS — E7800 Pure hypercholesterolemia, unspecified: Secondary | ICD-10-CM | POA: Diagnosis not present

## 2019-06-24 DIAGNOSIS — D509 Iron deficiency anemia, unspecified: Secondary | ICD-10-CM | POA: Diagnosis not present

## 2019-06-24 DIAGNOSIS — R54 Age-related physical debility: Secondary | ICD-10-CM | POA: Diagnosis not present

## 2019-06-24 DIAGNOSIS — I10 Essential (primary) hypertension: Secondary | ICD-10-CM | POA: Diagnosis not present

## 2019-06-24 DIAGNOSIS — E1140 Type 2 diabetes mellitus with diabetic neuropathy, unspecified: Secondary | ICD-10-CM | POA: Diagnosis not present

## 2019-06-24 DIAGNOSIS — I482 Chronic atrial fibrillation, unspecified: Secondary | ICD-10-CM | POA: Diagnosis not present

## 2019-06-26 DIAGNOSIS — I129 Hypertensive chronic kidney disease with stage 1 through stage 4 chronic kidney disease, or unspecified chronic kidney disease: Secondary | ICD-10-CM | POA: Diagnosis not present

## 2019-06-26 DIAGNOSIS — Z466 Encounter for fitting and adjustment of urinary device: Secondary | ICD-10-CM | POA: Diagnosis not present

## 2019-06-26 DIAGNOSIS — E1122 Type 2 diabetes mellitus with diabetic chronic kidney disease: Secondary | ICD-10-CM | POA: Diagnosis not present

## 2019-06-26 DIAGNOSIS — R339 Retention of urine, unspecified: Secondary | ICD-10-CM | POA: Diagnosis not present

## 2019-06-26 DIAGNOSIS — N3946 Mixed incontinence: Secondary | ICD-10-CM | POA: Diagnosis not present

## 2019-06-26 DIAGNOSIS — I480 Paroxysmal atrial fibrillation: Secondary | ICD-10-CM | POA: Diagnosis not present

## 2019-07-10 DIAGNOSIS — R339 Retention of urine, unspecified: Secondary | ICD-10-CM | POA: Diagnosis not present

## 2019-07-10 DIAGNOSIS — N3946 Mixed incontinence: Secondary | ICD-10-CM | POA: Diagnosis not present

## 2019-07-10 DIAGNOSIS — Z5181 Encounter for therapeutic drug level monitoring: Secondary | ICD-10-CM | POA: Diagnosis not present

## 2019-07-10 DIAGNOSIS — I129 Hypertensive chronic kidney disease with stage 1 through stage 4 chronic kidney disease, or unspecified chronic kidney disease: Secondary | ICD-10-CM | POA: Diagnosis not present

## 2019-07-10 DIAGNOSIS — E1142 Type 2 diabetes mellitus with diabetic polyneuropathy: Secondary | ICD-10-CM | POA: Diagnosis not present

## 2019-07-10 DIAGNOSIS — E1122 Type 2 diabetes mellitus with diabetic chronic kidney disease: Secondary | ICD-10-CM | POA: Diagnosis not present

## 2019-07-10 DIAGNOSIS — Z466 Encounter for fitting and adjustment of urinary device: Secondary | ICD-10-CM | POA: Diagnosis not present

## 2019-07-10 DIAGNOSIS — I480 Paroxysmal atrial fibrillation: Secondary | ICD-10-CM | POA: Diagnosis not present

## 2019-07-10 DIAGNOSIS — Z7901 Long term (current) use of anticoagulants: Secondary | ICD-10-CM | POA: Diagnosis not present

## 2019-07-10 DIAGNOSIS — Z7984 Long term (current) use of oral hypoglycemic drugs: Secondary | ICD-10-CM | POA: Diagnosis not present

## 2019-07-11 DIAGNOSIS — I129 Hypertensive chronic kidney disease with stage 1 through stage 4 chronic kidney disease, or unspecified chronic kidney disease: Secondary | ICD-10-CM | POA: Diagnosis not present

## 2019-07-11 DIAGNOSIS — R339 Retention of urine, unspecified: Secondary | ICD-10-CM | POA: Diagnosis not present

## 2019-07-11 DIAGNOSIS — Z466 Encounter for fitting and adjustment of urinary device: Secondary | ICD-10-CM | POA: Diagnosis not present

## 2019-07-11 DIAGNOSIS — N3946 Mixed incontinence: Secondary | ICD-10-CM | POA: Diagnosis not present

## 2019-07-11 DIAGNOSIS — I480 Paroxysmal atrial fibrillation: Secondary | ICD-10-CM | POA: Diagnosis not present

## 2019-07-11 DIAGNOSIS — E1122 Type 2 diabetes mellitus with diabetic chronic kidney disease: Secondary | ICD-10-CM | POA: Diagnosis not present

## 2019-07-24 DIAGNOSIS — I480 Paroxysmal atrial fibrillation: Secondary | ICD-10-CM | POA: Diagnosis not present

## 2019-07-24 DIAGNOSIS — N184 Chronic kidney disease, stage 4 (severe): Secondary | ICD-10-CM | POA: Diagnosis not present

## 2019-07-24 DIAGNOSIS — E1169 Type 2 diabetes mellitus with other specified complication: Secondary | ICD-10-CM | POA: Diagnosis not present

## 2019-07-24 DIAGNOSIS — N183 Chronic kidney disease, stage 3 unspecified: Secondary | ICD-10-CM | POA: Diagnosis not present

## 2019-07-24 DIAGNOSIS — R54 Age-related physical debility: Secondary | ICD-10-CM | POA: Diagnosis not present

## 2019-07-24 DIAGNOSIS — I482 Chronic atrial fibrillation, unspecified: Secondary | ICD-10-CM | POA: Diagnosis not present

## 2019-07-24 DIAGNOSIS — D509 Iron deficiency anemia, unspecified: Secondary | ICD-10-CM | POA: Diagnosis not present

## 2019-07-24 DIAGNOSIS — I129 Hypertensive chronic kidney disease with stage 1 through stage 4 chronic kidney disease, or unspecified chronic kidney disease: Secondary | ICD-10-CM | POA: Diagnosis not present

## 2019-07-24 DIAGNOSIS — I10 Essential (primary) hypertension: Secondary | ICD-10-CM | POA: Diagnosis not present

## 2019-07-24 DIAGNOSIS — E039 Hypothyroidism, unspecified: Secondary | ICD-10-CM | POA: Diagnosis not present

## 2019-07-24 DIAGNOSIS — Z466 Encounter for fitting and adjustment of urinary device: Secondary | ICD-10-CM | POA: Diagnosis not present

## 2019-07-24 DIAGNOSIS — I4891 Unspecified atrial fibrillation: Secondary | ICD-10-CM | POA: Diagnosis not present

## 2019-07-24 DIAGNOSIS — E1122 Type 2 diabetes mellitus with diabetic chronic kidney disease: Secondary | ICD-10-CM | POA: Diagnosis not present

## 2019-07-24 DIAGNOSIS — R339 Retention of urine, unspecified: Secondary | ICD-10-CM | POA: Diagnosis not present

## 2019-07-24 DIAGNOSIS — N3946 Mixed incontinence: Secondary | ICD-10-CM | POA: Diagnosis not present

## 2019-07-24 DIAGNOSIS — E7800 Pure hypercholesterolemia, unspecified: Secondary | ICD-10-CM | POA: Diagnosis not present

## 2019-07-24 DIAGNOSIS — E1140 Type 2 diabetes mellitus with diabetic neuropathy, unspecified: Secondary | ICD-10-CM | POA: Diagnosis not present

## 2019-07-29 DIAGNOSIS — Z79899 Other long term (current) drug therapy: Secondary | ICD-10-CM | POA: Diagnosis not present

## 2019-07-29 DIAGNOSIS — Z8673 Personal history of transient ischemic attack (TIA), and cerebral infarction without residual deficits: Secondary | ICD-10-CM | POA: Diagnosis not present

## 2019-07-29 DIAGNOSIS — E039 Hypothyroidism, unspecified: Secondary | ICD-10-CM | POA: Diagnosis not present

## 2019-07-29 DIAGNOSIS — E1165 Type 2 diabetes mellitus with hyperglycemia: Secondary | ICD-10-CM | POA: Diagnosis not present

## 2019-07-29 DIAGNOSIS — Z6830 Body mass index (BMI) 30.0-30.9, adult: Secondary | ICD-10-CM | POA: Diagnosis not present

## 2019-07-29 DIAGNOSIS — I480 Paroxysmal atrial fibrillation: Secondary | ICD-10-CM | POA: Diagnosis not present

## 2019-07-29 DIAGNOSIS — N184 Chronic kidney disease, stage 4 (severe): Secondary | ICD-10-CM | POA: Diagnosis not present

## 2019-08-05 DIAGNOSIS — I129 Hypertensive chronic kidney disease with stage 1 through stage 4 chronic kidney disease, or unspecified chronic kidney disease: Secondary | ICD-10-CM | POA: Diagnosis not present

## 2019-08-05 DIAGNOSIS — R339 Retention of urine, unspecified: Secondary | ICD-10-CM | POA: Diagnosis not present

## 2019-08-05 DIAGNOSIS — I480 Paroxysmal atrial fibrillation: Secondary | ICD-10-CM | POA: Diagnosis not present

## 2019-08-05 DIAGNOSIS — E1122 Type 2 diabetes mellitus with diabetic chronic kidney disease: Secondary | ICD-10-CM | POA: Diagnosis not present

## 2019-08-05 DIAGNOSIS — N3946 Mixed incontinence: Secondary | ICD-10-CM | POA: Diagnosis not present

## 2019-08-05 DIAGNOSIS — Z466 Encounter for fitting and adjustment of urinary device: Secondary | ICD-10-CM | POA: Diagnosis not present

## 2019-08-09 DIAGNOSIS — I129 Hypertensive chronic kidney disease with stage 1 through stage 4 chronic kidney disease, or unspecified chronic kidney disease: Secondary | ICD-10-CM | POA: Diagnosis not present

## 2019-08-09 DIAGNOSIS — R339 Retention of urine, unspecified: Secondary | ICD-10-CM | POA: Diagnosis not present

## 2019-08-09 DIAGNOSIS — E1142 Type 2 diabetes mellitus with diabetic polyneuropathy: Secondary | ICD-10-CM | POA: Diagnosis not present

## 2019-08-09 DIAGNOSIS — E1122 Type 2 diabetes mellitus with diabetic chronic kidney disease: Secondary | ICD-10-CM | POA: Diagnosis not present

## 2019-08-09 DIAGNOSIS — Z5181 Encounter for therapeutic drug level monitoring: Secondary | ICD-10-CM | POA: Diagnosis not present

## 2019-08-09 DIAGNOSIS — Z7984 Long term (current) use of oral hypoglycemic drugs: Secondary | ICD-10-CM | POA: Diagnosis not present

## 2019-08-09 DIAGNOSIS — I480 Paroxysmal atrial fibrillation: Secondary | ICD-10-CM | POA: Diagnosis not present

## 2019-08-09 DIAGNOSIS — N3946 Mixed incontinence: Secondary | ICD-10-CM | POA: Diagnosis not present

## 2019-08-09 DIAGNOSIS — Z466 Encounter for fitting and adjustment of urinary device: Secondary | ICD-10-CM | POA: Diagnosis not present

## 2019-08-09 DIAGNOSIS — Z7901 Long term (current) use of anticoagulants: Secondary | ICD-10-CM | POA: Diagnosis not present

## 2019-08-20 DIAGNOSIS — I4891 Unspecified atrial fibrillation: Secondary | ICD-10-CM | POA: Diagnosis not present

## 2019-08-20 DIAGNOSIS — E039 Hypothyroidism, unspecified: Secondary | ICD-10-CM | POA: Diagnosis not present

## 2019-08-20 DIAGNOSIS — E1122 Type 2 diabetes mellitus with diabetic chronic kidney disease: Secondary | ICD-10-CM | POA: Diagnosis not present

## 2019-08-20 DIAGNOSIS — E1169 Type 2 diabetes mellitus with other specified complication: Secondary | ICD-10-CM | POA: Diagnosis not present

## 2019-08-20 DIAGNOSIS — E1140 Type 2 diabetes mellitus with diabetic neuropathy, unspecified: Secondary | ICD-10-CM | POA: Diagnosis not present

## 2019-08-20 DIAGNOSIS — E7800 Pure hypercholesterolemia, unspecified: Secondary | ICD-10-CM | POA: Diagnosis not present

## 2019-08-20 DIAGNOSIS — R54 Age-related physical debility: Secondary | ICD-10-CM | POA: Diagnosis not present

## 2019-08-20 DIAGNOSIS — I482 Chronic atrial fibrillation, unspecified: Secondary | ICD-10-CM | POA: Diagnosis not present

## 2019-08-20 DIAGNOSIS — N183 Chronic kidney disease, stage 3 unspecified: Secondary | ICD-10-CM | POA: Diagnosis not present

## 2019-08-20 DIAGNOSIS — D509 Iron deficiency anemia, unspecified: Secondary | ICD-10-CM | POA: Diagnosis not present

## 2019-08-20 DIAGNOSIS — I10 Essential (primary) hypertension: Secondary | ICD-10-CM | POA: Diagnosis not present

## 2019-08-20 DIAGNOSIS — N184 Chronic kidney disease, stage 4 (severe): Secondary | ICD-10-CM | POA: Diagnosis not present

## 2019-08-21 DIAGNOSIS — N3946 Mixed incontinence: Secondary | ICD-10-CM | POA: Diagnosis not present

## 2019-08-21 DIAGNOSIS — E1122 Type 2 diabetes mellitus with diabetic chronic kidney disease: Secondary | ICD-10-CM | POA: Diagnosis not present

## 2019-08-21 DIAGNOSIS — R339 Retention of urine, unspecified: Secondary | ICD-10-CM | POA: Diagnosis not present

## 2019-08-21 DIAGNOSIS — I480 Paroxysmal atrial fibrillation: Secondary | ICD-10-CM | POA: Diagnosis not present

## 2019-08-21 DIAGNOSIS — Z466 Encounter for fitting and adjustment of urinary device: Secondary | ICD-10-CM | POA: Diagnosis not present

## 2019-08-21 DIAGNOSIS — I129 Hypertensive chronic kidney disease with stage 1 through stage 4 chronic kidney disease, or unspecified chronic kidney disease: Secondary | ICD-10-CM | POA: Diagnosis not present

## 2019-08-25 DIAGNOSIS — I480 Paroxysmal atrial fibrillation: Secondary | ICD-10-CM | POA: Diagnosis not present

## 2019-08-25 DIAGNOSIS — N3946 Mixed incontinence: Secondary | ICD-10-CM | POA: Diagnosis not present

## 2019-08-25 DIAGNOSIS — E1122 Type 2 diabetes mellitus with diabetic chronic kidney disease: Secondary | ICD-10-CM | POA: Diagnosis not present

## 2019-08-25 DIAGNOSIS — Z5181 Encounter for therapeutic drug level monitoring: Secondary | ICD-10-CM | POA: Diagnosis not present

## 2019-08-25 DIAGNOSIS — R338 Other retention of urine: Secondary | ICD-10-CM | POA: Diagnosis not present

## 2019-08-25 DIAGNOSIS — Z7984 Long term (current) use of oral hypoglycemic drugs: Secondary | ICD-10-CM | POA: Diagnosis not present

## 2019-08-25 DIAGNOSIS — Z466 Encounter for fitting and adjustment of urinary device: Secondary | ICD-10-CM | POA: Diagnosis not present

## 2019-08-25 DIAGNOSIS — Z7901 Long term (current) use of anticoagulants: Secondary | ICD-10-CM | POA: Diagnosis not present

## 2019-08-26 ENCOUNTER — Ambulatory Visit (INDEPENDENT_AMBULATORY_CARE_PROVIDER_SITE_OTHER): Payer: Medicare Other | Admitting: *Deleted

## 2019-08-26 DIAGNOSIS — I495 Sick sinus syndrome: Secondary | ICD-10-CM

## 2019-08-26 LAB — CUP PACEART REMOTE DEVICE CHECK
Battery Remaining Longevity: 4 mo
Battery Voltage: 2.86 V
Brady Statistic AP VP Percent: 99.99 %
Brady Statistic AP VS Percent: 0.01 %
Brady Statistic AS VP Percent: 0 %
Brady Statistic AS VS Percent: 0 %
Brady Statistic RA Percent Paced: 100 %
Brady Statistic RV Percent Paced: 99.75 %
Date Time Interrogation Session: 20210629090236
Implantable Lead Implant Date: 19990907
Implantable Lead Implant Date: 19990907
Implantable Lead Location: 753859
Implantable Lead Location: 753860
Implantable Lead Serial Number: 32904
Implantable Lead Serial Number: 33421
Implantable Pulse Generator Implant Date: 20140807
Lead Channel Impedance Value: 266 Ohm
Lead Channel Impedance Value: 285 Ohm
Lead Channel Impedance Value: 304 Ohm
Lead Channel Impedance Value: 342 Ohm
Lead Channel Pacing Threshold Amplitude: 1 V
Lead Channel Pacing Threshold Amplitude: 1.25 V
Lead Channel Pacing Threshold Pulse Width: 0.4 ms
Lead Channel Pacing Threshold Pulse Width: 0.4 ms
Lead Channel Sensing Intrinsic Amplitude: 0.25 mV
Lead Channel Sensing Intrinsic Amplitude: 0.25 mV
Lead Channel Sensing Intrinsic Amplitude: 10.625 mV
Lead Channel Sensing Intrinsic Amplitude: 10.625 mV
Lead Channel Setting Pacing Amplitude: 2 V
Lead Channel Setting Pacing Amplitude: 2.5 V
Lead Channel Setting Pacing Pulse Width: 0.4 ms
Lead Channel Setting Sensing Sensitivity: 8 mV

## 2019-08-27 NOTE — Progress Notes (Signed)
Remote pacemaker transmission.   

## 2019-09-04 DIAGNOSIS — N3946 Mixed incontinence: Secondary | ICD-10-CM | POA: Diagnosis not present

## 2019-09-04 DIAGNOSIS — R339 Retention of urine, unspecified: Secondary | ICD-10-CM | POA: Diagnosis not present

## 2019-09-04 DIAGNOSIS — I129 Hypertensive chronic kidney disease with stage 1 through stage 4 chronic kidney disease, or unspecified chronic kidney disease: Secondary | ICD-10-CM | POA: Diagnosis not present

## 2019-09-04 DIAGNOSIS — Z466 Encounter for fitting and adjustment of urinary device: Secondary | ICD-10-CM | POA: Diagnosis not present

## 2019-09-04 DIAGNOSIS — I480 Paroxysmal atrial fibrillation: Secondary | ICD-10-CM | POA: Diagnosis not present

## 2019-09-04 DIAGNOSIS — E1122 Type 2 diabetes mellitus with diabetic chronic kidney disease: Secondary | ICD-10-CM | POA: Diagnosis not present

## 2019-09-08 DIAGNOSIS — E1122 Type 2 diabetes mellitus with diabetic chronic kidney disease: Secondary | ICD-10-CM | POA: Diagnosis not present

## 2019-09-08 DIAGNOSIS — R339 Retention of urine, unspecified: Secondary | ICD-10-CM | POA: Diagnosis not present

## 2019-09-08 DIAGNOSIS — N3946 Mixed incontinence: Secondary | ICD-10-CM | POA: Diagnosis not present

## 2019-09-08 DIAGNOSIS — Z5181 Encounter for therapeutic drug level monitoring: Secondary | ICD-10-CM | POA: Diagnosis not present

## 2019-09-08 DIAGNOSIS — E1142 Type 2 diabetes mellitus with diabetic polyneuropathy: Secondary | ICD-10-CM | POA: Diagnosis not present

## 2019-09-08 DIAGNOSIS — Z7984 Long term (current) use of oral hypoglycemic drugs: Secondary | ICD-10-CM | POA: Diagnosis not present

## 2019-09-08 DIAGNOSIS — I480 Paroxysmal atrial fibrillation: Secondary | ICD-10-CM | POA: Diagnosis not present

## 2019-09-08 DIAGNOSIS — I129 Hypertensive chronic kidney disease with stage 1 through stage 4 chronic kidney disease, or unspecified chronic kidney disease: Secondary | ICD-10-CM | POA: Diagnosis not present

## 2019-09-08 DIAGNOSIS — Z466 Encounter for fitting and adjustment of urinary device: Secondary | ICD-10-CM | POA: Diagnosis not present

## 2019-09-08 DIAGNOSIS — Z7901 Long term (current) use of anticoagulants: Secondary | ICD-10-CM | POA: Diagnosis not present

## 2019-09-18 DIAGNOSIS — R339 Retention of urine, unspecified: Secondary | ICD-10-CM | POA: Diagnosis not present

## 2019-09-18 DIAGNOSIS — Z466 Encounter for fitting and adjustment of urinary device: Secondary | ICD-10-CM | POA: Diagnosis not present

## 2019-09-18 DIAGNOSIS — E1122 Type 2 diabetes mellitus with diabetic chronic kidney disease: Secondary | ICD-10-CM | POA: Diagnosis not present

## 2019-09-18 DIAGNOSIS — N3946 Mixed incontinence: Secondary | ICD-10-CM | POA: Diagnosis not present

## 2019-09-18 DIAGNOSIS — I129 Hypertensive chronic kidney disease with stage 1 through stage 4 chronic kidney disease, or unspecified chronic kidney disease: Secondary | ICD-10-CM | POA: Diagnosis not present

## 2019-09-18 DIAGNOSIS — I480 Paroxysmal atrial fibrillation: Secondary | ICD-10-CM | POA: Diagnosis not present

## 2019-09-22 DIAGNOSIS — I482 Chronic atrial fibrillation, unspecified: Secondary | ICD-10-CM | POA: Diagnosis not present

## 2019-09-22 DIAGNOSIS — E039 Hypothyroidism, unspecified: Secondary | ICD-10-CM | POA: Diagnosis not present

## 2019-09-22 DIAGNOSIS — E1140 Type 2 diabetes mellitus with diabetic neuropathy, unspecified: Secondary | ICD-10-CM | POA: Diagnosis not present

## 2019-09-22 DIAGNOSIS — E7800 Pure hypercholesterolemia, unspecified: Secondary | ICD-10-CM | POA: Diagnosis not present

## 2019-09-22 DIAGNOSIS — N184 Chronic kidney disease, stage 4 (severe): Secondary | ICD-10-CM | POA: Diagnosis not present

## 2019-09-22 DIAGNOSIS — N183 Chronic kidney disease, stage 3 unspecified: Secondary | ICD-10-CM | POA: Diagnosis not present

## 2019-09-22 DIAGNOSIS — I4891 Unspecified atrial fibrillation: Secondary | ICD-10-CM | POA: Diagnosis not present

## 2019-09-22 DIAGNOSIS — E1169 Type 2 diabetes mellitus with other specified complication: Secondary | ICD-10-CM | POA: Diagnosis not present

## 2019-09-22 DIAGNOSIS — E1122 Type 2 diabetes mellitus with diabetic chronic kidney disease: Secondary | ICD-10-CM | POA: Diagnosis not present

## 2019-09-22 DIAGNOSIS — D509 Iron deficiency anemia, unspecified: Secondary | ICD-10-CM | POA: Diagnosis not present

## 2019-09-22 DIAGNOSIS — I10 Essential (primary) hypertension: Secondary | ICD-10-CM | POA: Diagnosis not present

## 2019-09-22 DIAGNOSIS — R54 Age-related physical debility: Secondary | ICD-10-CM | POA: Diagnosis not present

## 2019-09-26 ENCOUNTER — Ambulatory Visit (INDEPENDENT_AMBULATORY_CARE_PROVIDER_SITE_OTHER): Payer: Medicare Other | Admitting: *Deleted

## 2019-09-26 DIAGNOSIS — I495 Sick sinus syndrome: Secondary | ICD-10-CM

## 2019-09-26 LAB — CUP PACEART REMOTE DEVICE CHECK
Battery Remaining Longevity: 3 mo
Battery Voltage: 2.85 V
Brady Statistic AP VP Percent: 100 %
Brady Statistic AP VS Percent: 0 %
Brady Statistic AS VP Percent: 0 %
Brady Statistic AS VS Percent: 0 %
Brady Statistic RA Percent Paced: 100 %
Brady Statistic RV Percent Paced: 99.94 %
Date Time Interrogation Session: 20210730095512
Implantable Lead Implant Date: 19990907
Implantable Lead Implant Date: 19990907
Implantable Lead Location: 753859
Implantable Lead Location: 753860
Implantable Lead Serial Number: 32904
Implantable Lead Serial Number: 33421
Implantable Pulse Generator Implant Date: 20140807
Lead Channel Impedance Value: 266 Ohm
Lead Channel Impedance Value: 266 Ohm
Lead Channel Impedance Value: 304 Ohm
Lead Channel Impedance Value: 323 Ohm
Lead Channel Pacing Threshold Amplitude: 1 V
Lead Channel Pacing Threshold Amplitude: 1.375 V
Lead Channel Pacing Threshold Pulse Width: 0.4 ms
Lead Channel Pacing Threshold Pulse Width: 0.4 ms
Lead Channel Sensing Intrinsic Amplitude: 0.25 mV
Lead Channel Sensing Intrinsic Amplitude: 0.25 mV
Lead Channel Sensing Intrinsic Amplitude: 10.625 mV
Lead Channel Sensing Intrinsic Amplitude: 10.625 mV
Lead Channel Setting Pacing Amplitude: 2 V
Lead Channel Setting Pacing Amplitude: 2.75 V
Lead Channel Setting Pacing Pulse Width: 0.4 ms
Lead Channel Setting Sensing Sensitivity: 8 mV

## 2019-09-30 NOTE — Progress Notes (Signed)
Remote pacemaker transmission.   

## 2019-10-03 DIAGNOSIS — I129 Hypertensive chronic kidney disease with stage 1 through stage 4 chronic kidney disease, or unspecified chronic kidney disease: Secondary | ICD-10-CM | POA: Diagnosis not present

## 2019-10-03 DIAGNOSIS — R339 Retention of urine, unspecified: Secondary | ICD-10-CM | POA: Diagnosis not present

## 2019-10-03 DIAGNOSIS — N3946 Mixed incontinence: Secondary | ICD-10-CM | POA: Diagnosis not present

## 2019-10-03 DIAGNOSIS — I480 Paroxysmal atrial fibrillation: Secondary | ICD-10-CM | POA: Diagnosis not present

## 2019-10-03 DIAGNOSIS — E1122 Type 2 diabetes mellitus with diabetic chronic kidney disease: Secondary | ICD-10-CM | POA: Diagnosis not present

## 2019-10-03 DIAGNOSIS — Z466 Encounter for fitting and adjustment of urinary device: Secondary | ICD-10-CM | POA: Diagnosis not present

## 2019-10-06 DIAGNOSIS — E039 Hypothyroidism, unspecified: Secondary | ICD-10-CM | POA: Diagnosis not present

## 2019-10-06 DIAGNOSIS — I10 Essential (primary) hypertension: Secondary | ICD-10-CM | POA: Diagnosis not present

## 2019-10-06 DIAGNOSIS — I129 Hypertensive chronic kidney disease with stage 1 through stage 4 chronic kidney disease, or unspecified chronic kidney disease: Secondary | ICD-10-CM | POA: Diagnosis not present

## 2019-10-06 DIAGNOSIS — E7800 Pure hypercholesterolemia, unspecified: Secondary | ICD-10-CM | POA: Diagnosis not present

## 2019-10-06 DIAGNOSIS — E1169 Type 2 diabetes mellitus with other specified complication: Secondary | ICD-10-CM | POA: Diagnosis not present

## 2019-10-06 DIAGNOSIS — E1122 Type 2 diabetes mellitus with diabetic chronic kidney disease: Secondary | ICD-10-CM | POA: Diagnosis not present

## 2019-10-06 DIAGNOSIS — D509 Iron deficiency anemia, unspecified: Secondary | ICD-10-CM | POA: Diagnosis not present

## 2019-10-06 DIAGNOSIS — N184 Chronic kidney disease, stage 4 (severe): Secondary | ICD-10-CM | POA: Diagnosis not present

## 2019-10-06 DIAGNOSIS — I4891 Unspecified atrial fibrillation: Secondary | ICD-10-CM | POA: Diagnosis not present

## 2019-10-06 DIAGNOSIS — I480 Paroxysmal atrial fibrillation: Secondary | ICD-10-CM | POA: Diagnosis not present

## 2019-10-06 DIAGNOSIS — N183 Chronic kidney disease, stage 3 unspecified: Secondary | ICD-10-CM | POA: Diagnosis not present

## 2019-10-08 DIAGNOSIS — Z466 Encounter for fitting and adjustment of urinary device: Secondary | ICD-10-CM | POA: Diagnosis not present

## 2019-10-08 DIAGNOSIS — I480 Paroxysmal atrial fibrillation: Secondary | ICD-10-CM | POA: Diagnosis not present

## 2019-10-08 DIAGNOSIS — Z7901 Long term (current) use of anticoagulants: Secondary | ICD-10-CM | POA: Diagnosis not present

## 2019-10-08 DIAGNOSIS — Z7984 Long term (current) use of oral hypoglycemic drugs: Secondary | ICD-10-CM | POA: Diagnosis not present

## 2019-10-08 DIAGNOSIS — E1122 Type 2 diabetes mellitus with diabetic chronic kidney disease: Secondary | ICD-10-CM | POA: Diagnosis not present

## 2019-10-08 DIAGNOSIS — R339 Retention of urine, unspecified: Secondary | ICD-10-CM | POA: Diagnosis not present

## 2019-10-08 DIAGNOSIS — I129 Hypertensive chronic kidney disease with stage 1 through stage 4 chronic kidney disease, or unspecified chronic kidney disease: Secondary | ICD-10-CM | POA: Diagnosis not present

## 2019-10-08 DIAGNOSIS — Z5181 Encounter for therapeutic drug level monitoring: Secondary | ICD-10-CM | POA: Diagnosis not present

## 2019-10-08 DIAGNOSIS — E1142 Type 2 diabetes mellitus with diabetic polyneuropathy: Secondary | ICD-10-CM | POA: Diagnosis not present

## 2019-10-08 DIAGNOSIS — N3946 Mixed incontinence: Secondary | ICD-10-CM | POA: Diagnosis not present

## 2019-10-09 DIAGNOSIS — E113293 Type 2 diabetes mellitus with mild nonproliferative diabetic retinopathy without macular edema, bilateral: Secondary | ICD-10-CM | POA: Diagnosis not present

## 2019-10-13 DIAGNOSIS — Z5181 Encounter for therapeutic drug level monitoring: Secondary | ICD-10-CM | POA: Diagnosis not present

## 2019-10-13 DIAGNOSIS — I480 Paroxysmal atrial fibrillation: Secondary | ICD-10-CM | POA: Diagnosis not present

## 2019-10-13 DIAGNOSIS — R339 Retention of urine, unspecified: Secondary | ICD-10-CM | POA: Diagnosis not present

## 2019-10-13 DIAGNOSIS — N3946 Mixed incontinence: Secondary | ICD-10-CM | POA: Diagnosis not present

## 2019-10-13 DIAGNOSIS — E1142 Type 2 diabetes mellitus with diabetic polyneuropathy: Secondary | ICD-10-CM | POA: Diagnosis not present

## 2019-10-13 DIAGNOSIS — E1122 Type 2 diabetes mellitus with diabetic chronic kidney disease: Secondary | ICD-10-CM | POA: Diagnosis not present

## 2019-10-13 DIAGNOSIS — Z7984 Long term (current) use of oral hypoglycemic drugs: Secondary | ICD-10-CM | POA: Diagnosis not present

## 2019-10-13 DIAGNOSIS — Z7901 Long term (current) use of anticoagulants: Secondary | ICD-10-CM | POA: Diagnosis not present

## 2019-10-13 DIAGNOSIS — I129 Hypertensive chronic kidney disease with stage 1 through stage 4 chronic kidney disease, or unspecified chronic kidney disease: Secondary | ICD-10-CM | POA: Diagnosis not present

## 2019-10-13 DIAGNOSIS — Z466 Encounter for fitting and adjustment of urinary device: Secondary | ICD-10-CM | POA: Diagnosis not present

## 2019-10-16 DIAGNOSIS — N3946 Mixed incontinence: Secondary | ICD-10-CM | POA: Diagnosis not present

## 2019-10-16 DIAGNOSIS — R339 Retention of urine, unspecified: Secondary | ICD-10-CM | POA: Diagnosis not present

## 2019-10-16 DIAGNOSIS — I129 Hypertensive chronic kidney disease with stage 1 through stage 4 chronic kidney disease, or unspecified chronic kidney disease: Secondary | ICD-10-CM | POA: Diagnosis not present

## 2019-10-16 DIAGNOSIS — I480 Paroxysmal atrial fibrillation: Secondary | ICD-10-CM | POA: Diagnosis not present

## 2019-10-16 DIAGNOSIS — Z466 Encounter for fitting and adjustment of urinary device: Secondary | ICD-10-CM | POA: Diagnosis not present

## 2019-10-16 DIAGNOSIS — E1122 Type 2 diabetes mellitus with diabetic chronic kidney disease: Secondary | ICD-10-CM | POA: Diagnosis not present

## 2019-10-24 DIAGNOSIS — N184 Chronic kidney disease, stage 4 (severe): Secondary | ICD-10-CM | POA: Diagnosis not present

## 2019-10-27 ENCOUNTER — Ambulatory Visit (INDEPENDENT_AMBULATORY_CARE_PROVIDER_SITE_OTHER): Payer: Medicare Other | Admitting: *Deleted

## 2019-10-27 DIAGNOSIS — I495 Sick sinus syndrome: Secondary | ICD-10-CM

## 2019-10-28 ENCOUNTER — Telehealth: Payer: Self-pay

## 2019-10-28 LAB — CUP PACEART REMOTE DEVICE CHECK
Battery Remaining Longevity: 2 mo
Battery Voltage: 2.84 V
Brady Statistic AP VP Percent: 100 %
Brady Statistic AP VS Percent: 0 %
Brady Statistic AS VP Percent: 0 %
Brady Statistic AS VS Percent: 0 %
Brady Statistic RA Percent Paced: 100 %
Brady Statistic RV Percent Paced: 99.97 %
Date Time Interrogation Session: 20210831130353
Implantable Lead Implant Date: 19990907
Implantable Lead Implant Date: 19990907
Implantable Lead Location: 753859
Implantable Lead Location: 753860
Implantable Lead Serial Number: 32904
Implantable Lead Serial Number: 33421
Implantable Pulse Generator Implant Date: 20140807
Lead Channel Impedance Value: 247 Ohm
Lead Channel Impedance Value: 266 Ohm
Lead Channel Impedance Value: 285 Ohm
Lead Channel Impedance Value: 304 Ohm
Lead Channel Pacing Threshold Amplitude: 1 V
Lead Channel Pacing Threshold Amplitude: 1.25 V
Lead Channel Pacing Threshold Pulse Width: 0.4 ms
Lead Channel Pacing Threshold Pulse Width: 0.4 ms
Lead Channel Sensing Intrinsic Amplitude: 0.25 mV
Lead Channel Sensing Intrinsic Amplitude: 0.25 mV
Lead Channel Sensing Intrinsic Amplitude: 10.625 mV
Lead Channel Sensing Intrinsic Amplitude: 10.625 mV
Lead Channel Setting Pacing Amplitude: 2 V
Lead Channel Setting Pacing Amplitude: 2.5 V
Lead Channel Setting Pacing Pulse Width: 0.4 ms
Lead Channel Setting Sensing Sensitivity: 8 mV

## 2019-10-28 NOTE — Telephone Encounter (Signed)
Patient called in to confirm that she has sent in her transmission

## 2019-10-30 DIAGNOSIS — D509 Iron deficiency anemia, unspecified: Secondary | ICD-10-CM | POA: Diagnosis not present

## 2019-10-30 DIAGNOSIS — I129 Hypertensive chronic kidney disease with stage 1 through stage 4 chronic kidney disease, or unspecified chronic kidney disease: Secondary | ICD-10-CM | POA: Diagnosis not present

## 2019-10-30 DIAGNOSIS — N183 Chronic kidney disease, stage 3 unspecified: Secondary | ICD-10-CM | POA: Diagnosis not present

## 2019-10-30 DIAGNOSIS — E1140 Type 2 diabetes mellitus with diabetic neuropathy, unspecified: Secondary | ICD-10-CM | POA: Diagnosis not present

## 2019-10-30 DIAGNOSIS — D631 Anemia in chronic kidney disease: Secondary | ICD-10-CM | POA: Diagnosis not present

## 2019-10-30 DIAGNOSIS — E1169 Type 2 diabetes mellitus with other specified complication: Secondary | ICD-10-CM | POA: Diagnosis not present

## 2019-10-30 DIAGNOSIS — E039 Hypothyroidism, unspecified: Secondary | ICD-10-CM | POA: Diagnosis not present

## 2019-10-30 DIAGNOSIS — E7800 Pure hypercholesterolemia, unspecified: Secondary | ICD-10-CM | POA: Diagnosis not present

## 2019-10-30 DIAGNOSIS — I10 Essential (primary) hypertension: Secondary | ICD-10-CM | POA: Diagnosis not present

## 2019-10-30 DIAGNOSIS — E1122 Type 2 diabetes mellitus with diabetic chronic kidney disease: Secondary | ICD-10-CM | POA: Diagnosis not present

## 2019-10-30 DIAGNOSIS — N2581 Secondary hyperparathyroidism of renal origin: Secondary | ICD-10-CM | POA: Diagnosis not present

## 2019-10-30 DIAGNOSIS — I480 Paroxysmal atrial fibrillation: Secondary | ICD-10-CM | POA: Diagnosis not present

## 2019-10-30 DIAGNOSIS — I4891 Unspecified atrial fibrillation: Secondary | ICD-10-CM | POA: Diagnosis not present

## 2019-10-30 DIAGNOSIS — N184 Chronic kidney disease, stage 4 (severe): Secondary | ICD-10-CM | POA: Diagnosis not present

## 2019-10-30 NOTE — Progress Notes (Signed)
Remote pacemaker transmission.   

## 2019-10-30 NOTE — Addendum Note (Signed)
Addended by: Douglass Rivers D on: 10/30/2019 12:37 PM   Modules accepted: Level of Service

## 2019-11-04 DIAGNOSIS — I480 Paroxysmal atrial fibrillation: Secondary | ICD-10-CM | POA: Diagnosis not present

## 2019-11-04 DIAGNOSIS — R339 Retention of urine, unspecified: Secondary | ICD-10-CM | POA: Diagnosis not present

## 2019-11-04 DIAGNOSIS — N3946 Mixed incontinence: Secondary | ICD-10-CM | POA: Diagnosis not present

## 2019-11-04 DIAGNOSIS — Z466 Encounter for fitting and adjustment of urinary device: Secondary | ICD-10-CM | POA: Diagnosis not present

## 2019-11-04 DIAGNOSIS — E1122 Type 2 diabetes mellitus with diabetic chronic kidney disease: Secondary | ICD-10-CM | POA: Diagnosis not present

## 2019-11-04 DIAGNOSIS — I129 Hypertensive chronic kidney disease with stage 1 through stage 4 chronic kidney disease, or unspecified chronic kidney disease: Secondary | ICD-10-CM | POA: Diagnosis not present

## 2019-11-07 DIAGNOSIS — Z5181 Encounter for therapeutic drug level monitoring: Secondary | ICD-10-CM | POA: Diagnosis not present

## 2019-11-07 DIAGNOSIS — E1122 Type 2 diabetes mellitus with diabetic chronic kidney disease: Secondary | ICD-10-CM | POA: Diagnosis not present

## 2019-11-07 DIAGNOSIS — R339 Retention of urine, unspecified: Secondary | ICD-10-CM | POA: Diagnosis not present

## 2019-11-07 DIAGNOSIS — I129 Hypertensive chronic kidney disease with stage 1 through stage 4 chronic kidney disease, or unspecified chronic kidney disease: Secondary | ICD-10-CM | POA: Diagnosis not present

## 2019-11-07 DIAGNOSIS — E1142 Type 2 diabetes mellitus with diabetic polyneuropathy: Secondary | ICD-10-CM | POA: Diagnosis not present

## 2019-11-07 DIAGNOSIS — Z7984 Long term (current) use of oral hypoglycemic drugs: Secondary | ICD-10-CM | POA: Diagnosis not present

## 2019-11-07 DIAGNOSIS — Z466 Encounter for fitting and adjustment of urinary device: Secondary | ICD-10-CM | POA: Diagnosis not present

## 2019-11-07 DIAGNOSIS — Z7901 Long term (current) use of anticoagulants: Secondary | ICD-10-CM | POA: Diagnosis not present

## 2019-11-07 DIAGNOSIS — I480 Paroxysmal atrial fibrillation: Secondary | ICD-10-CM | POA: Diagnosis not present

## 2019-11-07 DIAGNOSIS — N3946 Mixed incontinence: Secondary | ICD-10-CM | POA: Diagnosis not present

## 2019-11-13 DIAGNOSIS — I129 Hypertensive chronic kidney disease with stage 1 through stage 4 chronic kidney disease, or unspecified chronic kidney disease: Secondary | ICD-10-CM | POA: Diagnosis not present

## 2019-11-13 DIAGNOSIS — I480 Paroxysmal atrial fibrillation: Secondary | ICD-10-CM | POA: Diagnosis not present

## 2019-11-13 DIAGNOSIS — R339 Retention of urine, unspecified: Secondary | ICD-10-CM | POA: Diagnosis not present

## 2019-11-13 DIAGNOSIS — E1122 Type 2 diabetes mellitus with diabetic chronic kidney disease: Secondary | ICD-10-CM | POA: Diagnosis not present

## 2019-11-13 DIAGNOSIS — Z466 Encounter for fitting and adjustment of urinary device: Secondary | ICD-10-CM | POA: Diagnosis not present

## 2019-11-13 DIAGNOSIS — N3946 Mixed incontinence: Secondary | ICD-10-CM | POA: Diagnosis not present

## 2019-11-18 DIAGNOSIS — E1169 Type 2 diabetes mellitus with other specified complication: Secondary | ICD-10-CM | POA: Diagnosis not present

## 2019-11-18 DIAGNOSIS — N183 Chronic kidney disease, stage 3 unspecified: Secondary | ICD-10-CM | POA: Diagnosis not present

## 2019-11-18 DIAGNOSIS — N3946 Mixed incontinence: Secondary | ICD-10-CM | POA: Diagnosis not present

## 2019-11-18 DIAGNOSIS — R202 Paresthesia of skin: Secondary | ICD-10-CM | POA: Diagnosis not present

## 2019-11-18 DIAGNOSIS — E7800 Pure hypercholesterolemia, unspecified: Secondary | ICD-10-CM | POA: Diagnosis not present

## 2019-11-18 DIAGNOSIS — R609 Edema, unspecified: Secondary | ICD-10-CM | POA: Diagnosis not present

## 2019-11-18 DIAGNOSIS — Z23 Encounter for immunization: Secondary | ICD-10-CM | POA: Diagnosis not present

## 2019-11-18 DIAGNOSIS — Z85828 Personal history of other malignant neoplasm of skin: Secondary | ICD-10-CM | POA: Diagnosis not present

## 2019-11-18 DIAGNOSIS — E039 Hypothyroidism, unspecified: Secondary | ICD-10-CM | POA: Diagnosis not present

## 2019-11-18 DIAGNOSIS — I129 Hypertensive chronic kidney disease with stage 1 through stage 4 chronic kidney disease, or unspecified chronic kidney disease: Secondary | ICD-10-CM | POA: Diagnosis not present

## 2019-11-18 DIAGNOSIS — K219 Gastro-esophageal reflux disease without esophagitis: Secondary | ICD-10-CM | POA: Diagnosis not present

## 2019-11-18 DIAGNOSIS — I480 Paroxysmal atrial fibrillation: Secondary | ICD-10-CM | POA: Diagnosis not present

## 2019-11-25 ENCOUNTER — Ambulatory Visit (INDEPENDENT_AMBULATORY_CARE_PROVIDER_SITE_OTHER): Payer: Medicare Other | Admitting: Emergency Medicine

## 2019-11-25 ENCOUNTER — Telehealth: Payer: Self-pay

## 2019-11-25 DIAGNOSIS — I495 Sick sinus syndrome: Secondary | ICD-10-CM

## 2019-11-25 LAB — CUP PACEART REMOTE DEVICE CHECK
Battery Remaining Longevity: 1 mo
Battery Voltage: 2.83 V
Brady Statistic AP VP Percent: 99.99 %
Brady Statistic AP VS Percent: 0 %
Brady Statistic AS VP Percent: 0 %
Brady Statistic AS VS Percent: 0 %
Brady Statistic RA Percent Paced: 100 %
Brady Statistic RV Percent Paced: 99.95 %
Date Time Interrogation Session: 20210928090705
Implantable Lead Implant Date: 19990907
Implantable Lead Implant Date: 19990907
Implantable Lead Location: 753859
Implantable Lead Location: 753860
Implantable Lead Serial Number: 32904
Implantable Lead Serial Number: 33421
Implantable Pulse Generator Implant Date: 20140807
Lead Channel Impedance Value: 247 Ohm
Lead Channel Impedance Value: 266 Ohm
Lead Channel Impedance Value: 285 Ohm
Lead Channel Impedance Value: 304 Ohm
Lead Channel Pacing Threshold Amplitude: 1 V
Lead Channel Pacing Threshold Amplitude: 1.25 V
Lead Channel Pacing Threshold Pulse Width: 0.4 ms
Lead Channel Pacing Threshold Pulse Width: 0.4 ms
Lead Channel Sensing Intrinsic Amplitude: 0.25 mV
Lead Channel Sensing Intrinsic Amplitude: 0.25 mV
Lead Channel Sensing Intrinsic Amplitude: 10.625 mV
Lead Channel Sensing Intrinsic Amplitude: 10.625 mV
Lead Channel Setting Pacing Amplitude: 2 V
Lead Channel Setting Pacing Amplitude: 2.5 V
Lead Channel Setting Pacing Pulse Width: 0.4 ms
Lead Channel Setting Sensing Sensitivity: 8 mV

## 2019-11-25 NOTE — Telephone Encounter (Signed)
Returning patients phone call.  Patient wanted to know if we received her transmission. Transmission was received on 11/25/19. Advised patient we call if we see something on transmission, otherwise no news is good news. Patient verbalizes understanding.

## 2019-11-25 NOTE — Telephone Encounter (Signed)
New Message:     Pt would like for you to call her please.

## 2019-11-26 ENCOUNTER — Telehealth: Payer: Self-pay

## 2019-11-26 NOTE — Telephone Encounter (Signed)
Carelink alert received that device reached RRT 11/22/19.   Called to discuss with patient and advised they will need to come into office to discuss gen change to Dr. Rayann Heman.   Patient has yearly apt. With Dr. Rayann Heman 12/22/19. Advised patient I will send this to the scheduler and see if the patient should come in sooner or if this can be discussed during apt. On 12/22/19.   Patient request I call her daughter  Tiffany [name] [phone] ext. [phone] Cell- [phone]  Called and updated daughter, per daughter when the apt. Is scheduled please call daughter since she will have to make sure she is available to bring patient.   Routing to scheduling.

## 2019-11-27 NOTE — Progress Notes (Signed)
Remote pacemaker transmission.   

## 2019-12-02 DIAGNOSIS — E1122 Type 2 diabetes mellitus with diabetic chronic kidney disease: Secondary | ICD-10-CM | POA: Diagnosis not present

## 2019-12-02 DIAGNOSIS — I129 Hypertensive chronic kidney disease with stage 1 through stage 4 chronic kidney disease, or unspecified chronic kidney disease: Secondary | ICD-10-CM | POA: Diagnosis not present

## 2019-12-02 DIAGNOSIS — N3946 Mixed incontinence: Secondary | ICD-10-CM | POA: Diagnosis not present

## 2019-12-02 DIAGNOSIS — I480 Paroxysmal atrial fibrillation: Secondary | ICD-10-CM | POA: Diagnosis not present

## 2019-12-02 DIAGNOSIS — R339 Retention of urine, unspecified: Secondary | ICD-10-CM | POA: Diagnosis not present

## 2019-12-02 DIAGNOSIS — Z466 Encounter for fitting and adjustment of urinary device: Secondary | ICD-10-CM | POA: Diagnosis not present

## 2019-12-07 DIAGNOSIS — Z7901 Long term (current) use of anticoagulants: Secondary | ICD-10-CM | POA: Diagnosis not present

## 2019-12-07 DIAGNOSIS — R339 Retention of urine, unspecified: Secondary | ICD-10-CM | POA: Diagnosis not present

## 2019-12-07 DIAGNOSIS — I129 Hypertensive chronic kidney disease with stage 1 through stage 4 chronic kidney disease, or unspecified chronic kidney disease: Secondary | ICD-10-CM | POA: Diagnosis not present

## 2019-12-07 DIAGNOSIS — N3946 Mixed incontinence: Secondary | ICD-10-CM | POA: Diagnosis not present

## 2019-12-07 DIAGNOSIS — Z466 Encounter for fitting and adjustment of urinary device: Secondary | ICD-10-CM | POA: Diagnosis not present

## 2019-12-07 DIAGNOSIS — Z7984 Long term (current) use of oral hypoglycemic drugs: Secondary | ICD-10-CM | POA: Diagnosis not present

## 2019-12-07 DIAGNOSIS — E1142 Type 2 diabetes mellitus with diabetic polyneuropathy: Secondary | ICD-10-CM | POA: Diagnosis not present

## 2019-12-07 DIAGNOSIS — E1122 Type 2 diabetes mellitus with diabetic chronic kidney disease: Secondary | ICD-10-CM | POA: Diagnosis not present

## 2019-12-07 DIAGNOSIS — I480 Paroxysmal atrial fibrillation: Secondary | ICD-10-CM | POA: Diagnosis not present

## 2019-12-07 DIAGNOSIS — Z5181 Encounter for therapeutic drug level monitoring: Secondary | ICD-10-CM | POA: Diagnosis not present

## 2019-12-08 ENCOUNTER — Other Ambulatory Visit: Payer: Self-pay

## 2019-12-08 ENCOUNTER — Encounter: Payer: Self-pay | Admitting: Internal Medicine

## 2019-12-08 ENCOUNTER — Ambulatory Visit (INDEPENDENT_AMBULATORY_CARE_PROVIDER_SITE_OTHER): Payer: Medicare Other | Admitting: Internal Medicine

## 2019-12-08 ENCOUNTER — Encounter: Payer: Self-pay | Admitting: *Deleted

## 2019-12-08 VITALS — BP 138/62 | HR 76 | Ht 62.0 in | Wt 184.6 lb

## 2019-12-08 DIAGNOSIS — I442 Atrioventricular block, complete: Secondary | ICD-10-CM

## 2019-12-08 DIAGNOSIS — I495 Sick sinus syndrome: Secondary | ICD-10-CM

## 2019-12-08 DIAGNOSIS — I10 Essential (primary) hypertension: Secondary | ICD-10-CM

## 2019-12-08 DIAGNOSIS — I480 Paroxysmal atrial fibrillation: Secondary | ICD-10-CM | POA: Diagnosis not present

## 2019-12-08 NOTE — Progress Notes (Signed)
PCP: Antony Contras, MD   Primary EP:  Dr Lillette Boxer Hasini Peachey is a 86 y.o. female who presents today for routine electrophysiology followup.  Since last being seen in our clinic, the patient reports doing reasonably well.  + occasional fatigue.  Today, she denies symptoms of palpitations, chest pain, shortness of breath,  lower extremity edema, dizziness, presyncope, or syncope.  The patient is otherwise without complaint today.   Past Medical History:  Diagnosis Date  . Diverticulosis   . DM2 (diabetes mellitus, type 2) ([place])   . Esophageal reflux   . HTN (hypertension)   . Hyperlipidemia   . Hypothyroidism   . Microalbuminuria    last done in 1/07   . Paroxysmal atrial fibrillation (HCC)    and atrial tachycardia  . Stroke ([place]) 1999  . Tachycardia-bradycardia syndrome ([place])    s/p PPM  . Unspecified hypothyroidism 11/20/2013   Past Surgical History:  Procedure Laterality Date  . CHOLECYSTECTOMY    . COLONOSCOPY N/A 11/21/2013   Procedure: COLONOSCOPY;  Surgeon: Jeryl Columbia, MD;  Location: [facility] ENDOSCOPY;  Service: Endoscopy;  Laterality: N/A;  . HEMORRHOID SURGERY    . hysterectomy (other)    . PACEMAKER GENERATOR CHANGE  10/03/12   DMT Advisa DR gen change by Dr Rayann Heman  . PACEMAKER GENERATOR CHANGE N/A 10/03/2012   Procedure: PACEMAKER GENERATOR CHANGE;  Surgeon: Thompson Grayer, MD;  Location: [facility] CATH LAB;  Service: Cardiovascular;  Laterality: N/A;  . permanent pacemaker  1999   updated 2006 (MDT) by Dr Verlon Setting; gen change 09-2012 by Dr Rayann Heman (MDT)    ROS- all systems are reviewed and negative except as per HPI above  Current Outpatient Medications  Medication Sig Dispense Refill  . diltiazem (CARDIZEM CD) 180 MG 24 hr capsule Take 1 capsule by mouth daily.    . FERGON 240 (27 FE) MG tablet Take 1 tablet by mouth every evening.   3  . furosemide (LASIX) 20 MG tablet Take 20 mg by mouth daily.    Marland Kitchen glipiZIDE (GLUCOTROL) 10 MG tablet Take 10 mg by mouth 2 (two)  times daily before a meal.     . JANUVIA 25 MG tablet Take 25 mg by mouth daily at 12 noon.  3  . levothyroxine (SYNTHROID, LEVOTHROID) 50 MCG tablet Take 50 mcg by mouth daily before breakfast.     . potassium chloride SA (K-DUR,KLOR-CON) 20 MEQ tablet Take 20 mEq by mouth daily.    . ranitidine (ZANTAC) 150 MG capsule Take 150 mg by mouth every morning.     . rosuvastatin (CRESTOR) 10 MG tablet Take 10 mg by mouth daily.    . Semaglutide (OZEMPIC) 0.25 or 0.5 MG/DOSE SOPN Inject 0.25 mg into the skin once a week.    . warfarin (COUMADIN) 2 MG tablet Take 2 mg by mouth daily.     No current facility-administered medications for this visit.    Physical Exam: Vitals:   12/08/19 1629  BP: 138/62  Pulse: 76  SpO2: 98%  Weight: 184 lb 9.6 oz (83.7 kg)  Height: 5\' 2"  (1.575 m)    GEN- The patient is elderly appearing, alert and oriented x 3 today.  Head- normocephalic, atraumatic Eyes-  Sclera clear, conjunctiva pink Ears- hearing intact Oropharynx- clear Lungs- Clear to ausculation bilaterally, normal work of breathing Chest- pacemaker pocket is well healed.  There are two very small pink dots over the lateral portion of her R sided PPM scars.  Concerning  but not with obvious infection.  No warmth, tenderness/ drainage/ pain/ etc. Heart- Regular rate and rhythm, 2/6 SEM LUSB GI- soft, NT, ND, + BS Extremities- no clubbing, cyanosis, or edema  Pacemaker interrogation- reviewed in detail today,  See PACEART report  Echo 11/19/2014- EF 50-55%  ekg tracing ordered today is personally reviewed and shows AV paced  Assessment and Plan:  1. Symptomatic sinus bradycardia (atrial standstill) and complete heart block Normal pacemaker function however she has reached ERI. See Claudia Desanctis Art report No changes today she is device dependant today Risks, benefits, and alternatives to PPM pulse generator replacement were discussed in detail today.  The patient understands that risks include but are  not limited to bleeding, infection, pneumothorax, perforation, tamponade, vascular damage, renal failure, MI, stroke, death, damage to his existing leads, and lead dislodgement and wishes to proceed.  We will therefore schedule the procedure at the next available time. Her leads are from 1999 but appear to be functioning well.  2. HTN Stable No change required today  3. Paroxysmal atrial fibrillation 0 % afib in at least 3 years She is on coumadin for chads2vasc score of 6  Risks, benefits and potential toxicities for medications prescribed and/or refilled reviewed with patient today.   Thompson Grayer MD, [facility] 12/08/2019 4:38 PM

## 2019-12-08 NOTE — Patient Instructions (Addendum)
Medication Instructions:  Your physician recommends that you continue on your current medications as directed. Please refer to the Current Medication list given to you today.  *If you need a refill on your cardiac medications before your next appointment, please call your pharmacy*  Lab Work: Cbc, bmp   If you have labs (blood work) drawn today and your tests are completely normal, you will receive your results only by: Marland Kitchen MyChart Message (if you have MyChart) OR . A paper copy in the mail If you have any lab test that is abnormal or we need to change your treatment, we will call you to review the results.  Testing/Procedures: None ordered.  Follow-Up: At [facility], you and your health needs are our priority.  As part of our continuing mission to provide you with exceptional heart care, we have created designated Provider Care Teams.  These Care Teams include your primary Cardiologist (physician) and Advanced Practice Providers (APPs -  Physician Assistants and Nurse Practitioners) who all work together to provide you with the care you need, when you need it.  We recommend signing up for the patient portal called "MyChart".  Sign up information is provided on this After Visit Summary.  MyChart is used to connect with patients for Virtual Visits (Telemedicine).  Patients are able to view lab/test results, encounter notes, upcoming appointments, etc.  Non-urgent messages can be sent to your provider as well.   To learn more about what you can do with MyChart, go to [email or web address].      Other Instructions:  Pacemaker Implantation, Adult Pacemaker implantation is a procedure to place a pacemaker inside your chest. A pacemaker is a small computer that sends electrical signals to the heart and helps your heart beat normally. A pacemaker also stores information about your heart rhythms. You may need pacemaker implantation if you:  Have a slow heartbeat (bradycardia).  Faint  (syncope).  Have shortness of breath (dyspnea) due to heart problems. The pacemaker attaches to your heart through a wire, called a lead. Sometimes just one lead is needed. Other times, there will be two leads. There are two types of pacemakers:  Transvenous pacemaker. This type is placed under the skin or muscle of your chest. The lead goes through a vein in the chest area to reach the inside of the heart.  Epicardial pacemaker. This type is placed under the skin or muscle of your chest or belly. The lead goes through your chest to the outside of the heart. Tell a health care provider about:  Any allergies you have.  All medicines you are taking, including vitamins, herbs, eye drops, creams, and over-the-counter medicines.  Any problems you or family members have had with anesthetic medicines.  Any blood or bone disorders you have.  Any surgeries you have had.  Any medical conditions you have.  Whether you are pregnant or may be pregnant. What are the risks? Generally, this is a safe procedure. However, problems may occur, including:  Infection.  Bleeding.  Failure of the pacemaker or the lead.  Collapse of a lung or bleeding into a lung.  Blood clot inside a blood vessel with a lead.  Damage to the heart.  Infection inside the heart (endocarditis).  Allergic reactions to medicines. What happens before the procedure? Staying hydrated Follow instructions from your health care provider about hydration, which may include:  Up to 2 hours before the procedure - you may continue to drink clear liquids, such as water, clear fruit juice,  black coffee, and plain tea. Eating and drinking restrictions Follow instructions from your health care provider about eating and drinking, which may include:  8 hours before the procedure - stop eating heavy meals or foods such as meat, fried foods, or fatty foods.  6 hours before the procedure - stop eating light meals or foods, such as  toast or cereal.  6 hours before the procedure - stop drinking milk or drinks that contain milk.  2 hours before the procedure - stop drinking clear liquids. Medicines  Ask your health care provider about: ? Changing or stopping your regular medicines. This is especially important if you are taking diabetes medicines or blood thinners. ? Taking medicines such as aspirin and ibuprofen. These medicines can thin your blood. Do not take these medicines before your procedure if your health care provider instructs you not to.  You may be given antibiotic medicine to help prevent infection. General instructions  You will have a heart evaluation. This may include an electrocardiogram (ECG), chest X-ray, and heart imaging (echocardiogram,  or echo) tests.  You will have blood tests.  Do not use any products that contain nicotine or tobacco, such as cigarettes and e-cigarettes. If you need help quitting, ask your health care provider.  Plan to have someone take you home from the hospital or clinic.  If you will be going home right after the procedure, plan to have someone with you for 24 hours.  Ask your health care provider how your surgical site will be marked or identified. What happens during the procedure?  To reduce your risk of infection: ? Your health care team will wash or sanitize their hands. ? Your skin will be washed with soap. ? Hair may be removed from the surgical area.  An IV tube will be inserted into one of your veins.  You will be given one or more of the following: ? A medicine to help you relax (sedative). ? A medicine to numb the area (local anesthetic). ? A medicine to make you fall asleep (general anesthetic).  If you are getting a transvenous pacemaker: ? An incision will be made in your upper chest. ? A pocket will be made for the pacemaker. It may be placed under the skin or between layers of muscle. ? The lead will be inserted into a blood vessel that  returns to the heart. ? While X-rays are taken by an imaging machine (fluoroscopy), the lead will be advanced through the vein to the inside of your heart. ? The other end of the lead will be tunneled under the skin and attached to the pacemaker.  If you are getting an epicardial pacemaker: ? An incision will be made near your ribs or breastbone (sternum) for the lead. ? The lead will be attached to the outside of your heart. ? Another incision will be made in your chest or upper belly to create a pocket for the pacemaker. ? The free end of the lead will be tunneled under the skin and attached to the pacemaker.  The transvenous or epicardial pacemaker will be tested. Imaging studies may be done to check the lead position.  The incisions will be closed with stitches (sutures), adhesive strips, or skin glue.  Bandages (dressing) will be placed over the incisions. The procedure may vary among health care providers and hospitals. What happens after the procedure?  Your blood pressure, heart rate, breathing rate, and blood oxygen level will be monitored until the medicines you were  given have worn off.  You will be given antibiotics and pain medicine.  ECG and chest x-rays will be done.  You will wear a continuous type of ECG (Holter monitor) to check your heart rhythm.  Your health care provider will program the pacemaker.  Do not drive for 24 hours if you received a sedative. This information is not intended to replace advice given to you by your health care provider. Make sure you discuss any questions you have with your health care provider. Document Revised: 11/02/2017 Document Reviewed: 07/28/2015 Elsevier Patient Education  Tiffany [name].

## 2019-12-08 NOTE — H&P (View-Only) (Signed)
PCP: Antony Contras, MD   Primary EP:  Dr Lillette Boxer Arthella Tiffany [name] is a 86 y.o. female who presents today for routine electrophysiology followup.  Since last being seen in our clinic, the patient reports doing reasonably well.  + occasional fatigue.  Today, she denies symptoms of palpitations, chest pain, shortness of breath,  lower extremity edema, dizziness, presyncope, or syncope.  The patient is otherwise without complaint today.   Past Medical History:  Diagnosis Date  . Diverticulosis   . DM2 (diabetes mellitus, type 2) ([place])   . Esophageal reflux   . HTN (hypertension)   . Hyperlipidemia   . Hypothyroidism   . Microalbuminuria    last done in 1/07   . Paroxysmal atrial fibrillation ([facility])    and atrial tachycardia  . Stroke ([place]) 1999  . Tachycardia-bradycardia syndrome ([place])    s/p PPM  . Unspecified hypothyroidism 11/20/2013   Past Surgical History:  Procedure Laterality Date  . CHOLECYSTECTOMY    . COLONOSCOPY N/A 11/21/2013   Procedure: COLONOSCOPY;  Surgeon: Jeryl Columbia, MD;  Location: [facility] ENDOSCOPY;  Service: Endoscopy;  Laterality: N/A;  . HEMORRHOID SURGERY    . hysterectomy (other)    . PACEMAKER GENERATOR CHANGE  10/03/12   DMT Advisa DR gen change by Dr Rayann Heman  . PACEMAKER GENERATOR CHANGE N/A 10/03/2012   Procedure: PACEMAKER GENERATOR CHANGE;  Surgeon: Thompson Grayer, MD;  Location: [facility] CATH LAB;  Service: Cardiovascular;  Laterality: N/A;  . permanent pacemaker  1999   updated 2006 (MDT) by Dr Verlon Setting; gen change 09-2012 by Dr Rayann Heman (MDT)    ROS- all systems are reviewed and negative except as per HPI above  Current Outpatient Medications  Medication Sig Dispense Refill  . diltiazem (CARDIZEM CD) 180 MG 24 hr capsule Take 1 capsule by mouth daily.    . FERGON 240 (27 FE) MG tablet Take 1 tablet by mouth every evening.   3  . furosemide (LASIX) 20 MG tablet Take 20 mg by mouth daily.    Marland Kitchen glipiZIDE (GLUCOTROL) 10 MG tablet Take 10 mg by mouth 2 (two)  times daily before a meal.     . JANUVIA 25 MG tablet Take 25 mg by mouth daily at 12 noon.  3  . levothyroxine (SYNTHROID, LEVOTHROID) 50 MCG tablet Take 50 mcg by mouth daily before breakfast.     . potassium chloride SA (K-DUR,KLOR-CON) 20 MEQ tablet Take 20 mEq by mouth daily.    . ranitidine (ZANTAC) 150 MG capsule Take 150 mg by mouth every morning.     . rosuvastatin (CRESTOR) 10 MG tablet Take 10 mg by mouth daily.    . Semaglutide (OZEMPIC) 0.25 or 0.5 MG/DOSE SOPN Inject 0.25 mg into the skin once a week.    . warfarin (COUMADIN) 2 MG tablet Take 2 mg by mouth daily.     No current facility-administered medications for this visit.    Physical Exam: Vitals:   12/08/19 1629  BP: 138/62  Pulse: 76  SpO2: 98%  Weight: 184 lb 9.6 oz (83.7 kg)  Height: 5\' 2"  (1.575 m)    GEN- The patient is elderly appearing, alert and oriented x 3 today.  Head- normocephalic, atraumatic Eyes-  Sclera clear, conjunctiva pink Ears- hearing intact Oropharynx- clear Lungs- Clear to ausculation bilaterally, normal work of breathing Chest- pacemaker pocket is well healed.  There are two very small pink dots over the lateral portion of her R sided PPM scars.  Concerning  but not with obvious infection.  No warmth, tenderness/ drainage/ pain/ etc. Heart- Regular rate and rhythm, 2/6 SEM LUSB GI- soft, NT, ND, + BS Extremities- no clubbing, cyanosis, or edema  Pacemaker interrogation- reviewed in detail today,  See PACEART report  Echo 11/19/2014- EF 50-55%  ekg tracing ordered today is personally reviewed and shows AV paced  Assessment and Plan:  1. Symptomatic sinus bradycardia (atrial standstill) and complete heart block Normal pacemaker function however she has reached ERI. See Claudia Desanctis Art report No changes today she is device dependant today Risks, benefits, and alternatives to PPM pulse generator replacement were discussed in detail today.  The patient understands that risks include but are  not limited to bleeding, infection, pneumothorax, perforation, tamponade, vascular damage, renal failure, MI, stroke, death, damage to his existing leads, and lead dislodgement and wishes to proceed.  We will therefore schedule the procedure at the next available time. Her leads are from 1999 but appear to be functioning well.  2. HTN Stable No change required today  3. Paroxysmal atrial fibrillation 0 % afib in at least 3 years She is on coumadin for chads2vasc score of 6  Risks, benefits and potential toxicities for medications prescribed and/or refilled reviewed with patient today.   Thompson Grayer MD, [facility] 12/08/2019 4:38 PM

## 2019-12-09 DIAGNOSIS — E1122 Type 2 diabetes mellitus with diabetic chronic kidney disease: Secondary | ICD-10-CM | POA: Diagnosis not present

## 2019-12-09 DIAGNOSIS — N3946 Mixed incontinence: Secondary | ICD-10-CM | POA: Diagnosis not present

## 2019-12-09 DIAGNOSIS — R339 Retention of urine, unspecified: Secondary | ICD-10-CM | POA: Diagnosis not present

## 2019-12-09 DIAGNOSIS — I129 Hypertensive chronic kidney disease with stage 1 through stage 4 chronic kidney disease, or unspecified chronic kidney disease: Secondary | ICD-10-CM | POA: Diagnosis not present

## 2019-12-09 DIAGNOSIS — Z466 Encounter for fitting and adjustment of urinary device: Secondary | ICD-10-CM | POA: Diagnosis not present

## 2019-12-09 DIAGNOSIS — I480 Paroxysmal atrial fibrillation: Secondary | ICD-10-CM | POA: Diagnosis not present

## 2019-12-09 LAB — BASIC METABOLIC PANEL
BUN/Creatinine Ratio: 17 (ref 12–28)
BUN: 36 mg/dL — ABNORMAL HIGH (ref 8–27)
CO2: 26 mmol/L (ref 20–29)
Calcium: 9.4 mg/dL (ref 8.7–10.3)
Chloride: 101 mmol/L (ref 96–106)
Creatinine, Ser: 2.17 mg/dL — ABNORMAL HIGH (ref 0.57–1.00)
GFR calc Af Amer: 23 mL/min/{1.73_m2} — ABNORMAL LOW (ref >59)
GFR calc non Af Amer: 20 mL/min/{1.73_m2} — ABNORMAL LOW (ref >59)
Glucose: 134 mg/dL — ABNORMAL HIGH (ref 65–99)
Potassium: 4.1 mmol/L (ref 3.5–5.2)
Sodium: 141 mmol/L (ref 134–144)

## 2019-12-09 LAB — CBC WITH DIFFERENTIAL/PLATELET
Basophils Absolute: 0.1 10*3/uL (ref 0.0–0.2)
Basos: 1 %
EOS (ABSOLUTE): 0.6 10*3/uL — ABNORMAL HIGH (ref 0.0–0.4)
Eos: 8 %
Hematocrit: 33.8 % — ABNORMAL LOW (ref 34.0–46.6)
Hemoglobin: 11.5 g/dL (ref 11.1–15.9)
Immature Grans (Abs): 0 10*3/uL (ref 0.0–0.1)
Immature Granulocytes: 0 %
Lymphocytes Absolute: 1.8 10*3/uL (ref 0.7–3.1)
Lymphs: 24 %
MCH: 29 pg (ref 26.6–33.0)
MCHC: 34 g/dL (ref 31.5–35.7)
MCV: 85 fL (ref 79–97)
Monocytes Absolute: 0.7 10*3/uL (ref 0.1–0.9)
Monocytes: 10 %
Neutrophils Absolute: 4.3 10*3/uL (ref 1.4–7.0)
Neutrophils: 57 %
Platelets: 192 10*3/uL (ref 150–450)
RBC: 3.96 x10E6/uL (ref 3.77–5.28)
RDW: 15.9 % — ABNORMAL HIGH (ref 11.7–15.4)
WBC: 7.5 10*3/uL (ref 3.4–10.8)

## 2019-12-09 LAB — CUP PACEART INCLINIC DEVICE CHECK
Battery Remaining Longevity: 1 mo
Battery Voltage: 2.83 V
Brady Statistic AP VP Percent: 99.99 %
Brady Statistic AP VS Percent: 0.01 %
Brady Statistic AS VP Percent: 0 %
Brady Statistic AS VS Percent: 0 %
Brady Statistic RA Percent Paced: 100 %
Brady Statistic RV Percent Paced: 99.87 %
Date Time Interrogation Session: 20211011165900
Implantable Lead Implant Date: 19990907
Implantable Lead Implant Date: 19990907
Implantable Lead Location: 753859
Implantable Lead Location: 753860
Implantable Lead Serial Number: 32904
Implantable Lead Serial Number: 33421
Implantable Pulse Generator Implant Date: 20140807
Lead Channel Impedance Value: 266 Ohm
Lead Channel Impedance Value: 285 Ohm
Lead Channel Impedance Value: 285 Ohm
Lead Channel Impedance Value: 304 Ohm
Lead Channel Pacing Threshold Amplitude: 1 V
Lead Channel Pacing Threshold Amplitude: 1.125 V
Lead Channel Pacing Threshold Pulse Width: 0.4 ms
Lead Channel Pacing Threshold Pulse Width: 0.4 ms
Lead Channel Sensing Intrinsic Amplitude: 0.25 mV
Lead Channel Sensing Intrinsic Amplitude: 0.25 mV
Lead Channel Sensing Intrinsic Amplitude: 10.625 mV
Lead Channel Sensing Intrinsic Amplitude: 10.625 mV
Lead Channel Setting Pacing Amplitude: 2 V
Lead Channel Setting Pacing Amplitude: 2.5 V
Lead Channel Setting Pacing Pulse Width: 0.4 ms
Lead Channel Setting Sensing Sensitivity: 8 mV

## 2019-12-10 ENCOUNTER — Other Ambulatory Visit (HOSPITAL_COMMUNITY)
Admission: RE | Admit: 2019-12-10 | Discharge: 2019-12-10 | Disposition: A | Discharge status: Home / Self Care | Payer: Medicare Other | Source: Ambulatory Visit | Attending: Internal Medicine | Admitting: Internal Medicine

## 2019-12-10 ENCOUNTER — Encounter: Payer: Self-pay | Admitting: *Deleted

## 2019-12-10 ENCOUNTER — Telehealth: Payer: Self-pay | Admitting: *Deleted

## 2019-12-10 DIAGNOSIS — Z20822 Contact with and (suspected) exposure to covid-19: Secondary | ICD-10-CM | POA: Diagnosis not present

## 2019-12-10 DIAGNOSIS — Z01812 Encounter for preprocedural laboratory examination: Secondary | ICD-10-CM | POA: Diagnosis not present

## 2019-12-10 LAB — SARS CORONAVIRUS 2 (TAT 6-24 HRS): SARS Coronavirus 2: NEGATIVE

## 2019-12-10 NOTE — Telephone Encounter (Signed)
Spoke to [facility] who is assisting the patient with pre gen change instructions and was at last visit with Dr. Rayann Heman.  The patient was seen at the end of the day just a few days ago and I just wanted to call and make sure they understood all the instructions for tomorrow procedure and did not have any further questions.   Daughter and I dicussed instructions and see verbalized understanding.

## 2019-12-11 ENCOUNTER — Other Ambulatory Visit: Payer: Self-pay

## 2019-12-11 ENCOUNTER — Ambulatory Visit (HOSPITAL_COMMUNITY)
Admission: RE | Admit: 2019-12-11 | Discharge: 2019-12-11 | Disposition: A | Discharge status: Home / Self Care | Payer: Medicare Other | Attending: Internal Medicine | Admitting: Internal Medicine

## 2019-12-11 ENCOUNTER — Encounter (HOSPITAL_COMMUNITY)
Admission: RE | Disposition: A | Discharge status: Home / Self Care | Payer: Self-pay | Source: Home / Self Care | Attending: Internal Medicine

## 2019-12-11 DIAGNOSIS — E039 Hypothyroidism, unspecified: Secondary | ICD-10-CM | POA: Diagnosis not present

## 2019-12-11 DIAGNOSIS — Z79890 Hormone replacement therapy (postmenopausal): Secondary | ICD-10-CM | POA: Insufficient documentation

## 2019-12-11 DIAGNOSIS — Z7901 Long term (current) use of anticoagulants: Secondary | ICD-10-CM | POA: Diagnosis not present

## 2019-12-11 DIAGNOSIS — Z79899 Other long term (current) drug therapy: Secondary | ICD-10-CM | POA: Insufficient documentation

## 2019-12-11 DIAGNOSIS — K219 Gastro-esophageal reflux disease without esophagitis: Secondary | ICD-10-CM | POA: Diagnosis not present

## 2019-12-11 DIAGNOSIS — Z7984 Long term (current) use of oral hypoglycemic drugs: Secondary | ICD-10-CM | POA: Insufficient documentation

## 2019-12-11 DIAGNOSIS — Z8673 Personal history of transient ischemic attack (TIA), and cerebral infarction without residual deficits: Secondary | ICD-10-CM | POA: Insufficient documentation

## 2019-12-11 DIAGNOSIS — Z45010 Encounter for checking and testing of cardiac pacemaker pulse generator [battery]: Secondary | ICD-10-CM | POA: Insufficient documentation

## 2019-12-11 DIAGNOSIS — I495 Sick sinus syndrome: Secondary | ICD-10-CM | POA: Diagnosis not present

## 2019-12-11 DIAGNOSIS — I10 Essential (primary) hypertension: Secondary | ICD-10-CM | POA: Insufficient documentation

## 2019-12-11 DIAGNOSIS — E785 Hyperlipidemia, unspecified: Secondary | ICD-10-CM | POA: Diagnosis not present

## 2019-12-11 DIAGNOSIS — I442 Atrioventricular block, complete: Secondary | ICD-10-CM | POA: Insufficient documentation

## 2019-12-11 DIAGNOSIS — I480 Paroxysmal atrial fibrillation: Secondary | ICD-10-CM | POA: Insufficient documentation

## 2019-12-11 DIAGNOSIS — E119 Type 2 diabetes mellitus without complications: Secondary | ICD-10-CM | POA: Diagnosis not present

## 2019-12-11 HISTORY — PX: PPM GENERATOR CHANGEOUT: EP1233

## 2019-12-11 LAB — GLUCOSE, CAPILLARY: Glucose-Capillary: 98 mg/dL (ref 70–99)

## 2019-12-11 LAB — PROTIME-INR
INR: 1.4 — ABNORMAL HIGH (ref 0.8–1.2)
Prothrombin Time: 16.9 seconds — ABNORMAL HIGH (ref 11.4–15.2)

## 2019-12-11 SURGERY — PPM GENERATOR CHANGEOUT

## 2019-12-11 MED ORDER — SODIUM CHLORIDE 0.9% FLUSH: 3.0000 mL | INTRAVENOUS | Status: DC | PRN

## 2019-12-11 MED ORDER — CHLORHEXIDINE GLUCONATE 4 % EX LIQD
4.0000 "application " | Freq: Once | CUTANEOUS | Status: DC
  Filled 2019-12-11: qty 60

## 2019-12-11 MED ORDER — ACETAMINOPHEN 325 MG PO TABS
325.0000 mg | ORAL_TABLET | ORAL | Status: DC | PRN
  Filled 2019-12-11: qty 2

## 2019-12-11 MED ORDER — ONDANSETRON HCL 4 MG/2ML IJ SOLN: 4.0000 mg | Freq: Four times a day (QID) | INTRAMUSCULAR | Status: DC | PRN

## 2019-12-11 MED ORDER — CEFAZOLIN SODIUM-DEXTROSE 2-4 GM/100ML-% IV SOLN
2.0000 g | INTRAVENOUS | Status: AC
  Administered 2019-12-11: 2 g via INTRAVENOUS

## 2019-12-11 MED ORDER — SODIUM CHLORIDE 0.9 % IV SOLN
80.0000 mg | INTRAVENOUS | Status: AC
  Administered 2019-12-11: 80 mg

## 2019-12-11 MED ORDER — LIDOCAINE HCL (PF) 1 % IJ SOLN
INTRAMUSCULAR | Status: AC
  Filled 2019-12-11: qty 60

## 2019-12-11 MED ORDER — SODIUM CHLORIDE 0.9 % IV SOLN: INTRAVENOUS | Status: DC

## 2019-12-11 MED ORDER — SODIUM CHLORIDE 0.9 % IV SOLN: 250.0000 mL | INTRAVENOUS | Status: DC | PRN

## 2019-12-11 MED ORDER — LIDOCAINE HCL (PF) 1 % IJ SOLN
INTRAMUSCULAR | Status: DC | PRN
  Administered 2019-12-11: 50 mL

## 2019-12-11 MED ORDER — CEFAZOLIN SODIUM-DEXTROSE 2-4 GM/100ML-% IV SOLN
INTRAVENOUS | Status: AC
  Filled 2019-12-11: qty 100

## 2019-12-11 MED ORDER — SODIUM CHLORIDE 0.9% FLUSH: 3.0000 mL | Freq: Two times a day (BID) | INTRAVENOUS | Status: DC

## 2019-12-11 MED ORDER — SODIUM CHLORIDE 0.9 % IV SOLN
INTRAVENOUS | Status: AC
  Filled 2019-12-11: qty 2

## 2019-12-11 SURGICAL SUPPLY — 7 items
CABLE SURGICAL S-101-97-12 (CABLE) ×2 IMPLANT
IPG PACE AZUR XT DR MRI W1DR01 (Pacemaker) IMPLANT
PACE AZURE XT DR MRI W1DR01 (Pacemaker) ×2 IMPLANT
PAD PRO RADIOLUCENT 2001M-C (PAD) ×2 IMPLANT
POUCH AIGIS-R ANTIBACT ICD (Mesh General) ×2 IMPLANT
POUCH AIGIS-R ANTIBACT ICD LRG (Mesh General) IMPLANT
TRAY PACEMAKER INSERTION (PACKS) ×2 IMPLANT

## 2019-12-11 NOTE — Discharge Instructions (Signed)
Resume coumadin on 12/13/2019  Implantable Cardiac Device Battery Change, Care After  This sheet gives you information about how to care for yourself after your procedure. Your health care provider may also give you more specific instructions. If you have problems or questions, contact your health care provider. What can I expect after the procedure? After your procedure, it is common to have:  Pain or soreness at the site where the cardiac device was inserted.  Swelling at the site where the cardiac device was inserted.  You should received an information card for your new device in 4-8 weeks. Follow these instructions at home: Incision care   Keep the incision clean and dry. ? Do not take baths, swim, or use a hot tub until after your wound check.  ? Do not shower for at least 7 days, or as directed by your health care provider. ? Pat the area dry with a clean towel. Do not rub the area. This may cause bleeding.  Follow instructions from your health care provider about how to take care of your incision. Make sure you: ? Leave stitches (sutures), skin glue, or adhesive strips in place. These skin closures may need to stay in place for 2 weeks or longer. If adhesive strip edges start to loosen and curl up, you may trim the loose edges. Do not remove adhesive strips completely unless your health care provider tells you to do that.  Check your incision area every day for signs of infection. Check for: ? More redness, swelling, or pain. ? More fluid or blood. ? Warmth. ? Pus or a bad smell. Activity  Do not lift anything that is heavier than 10 lb (4.5 kg) until your health care provider says it is okay to do so.  For the first week, or as long as told by your health care provider: ? Avoid lifting your affected arm higher than your shoulder. ? After 1 week, Be gentle when you move your arms over your head. It is okay to raise your arm to comb your hair. ? Avoid strenuous  exercise.  Ask your health care provider when it is okay to: ? Resume your normal activities. ? Return to work or school. ? Resume sexual activity. Eating and drinking  Eat a heart-healthy diet. This should include plenty of fresh fruits and vegetables, whole grains, low-fat dairy products, and lean protein like chicken and fish.  Limit alcohol intake to no more than 1 drink a day for non-pregnant women and 2 drinks a day for men. One drink equals 12 oz of beer, 5 oz of wine, or 1 oz of hard liquor.  Check ingredients and nutrition facts on packaged foods and beverages. Avoid the following types of food: ? Food that is high in salt (sodium). ? Food that is high in saturated fat, like full-fat dairy or red meat. ? Food that is high in trans fat, like fried food. ? Food and drinks that are high in sugar. Lifestyle  Do not use any products that contain nicotine or tobacco, such as cigarettes and e-cigarettes. If you need help quitting, ask your health care provider.  Take steps to manage and control your weight.  Once cleared, get regular exercise. Aim for 150 minutes of moderate-intensity exercise (such as walking or yoga) or 75 minutes of vigorous exercise (such as running or swimming) each week.  Manage other health problems, such as diabetes or high blood pressure. Ask your health care provider how you can manage these  conditions. General instructions  Do not drive for 24 hours after your procedure if you were given a medicine to help you relax (sedative).  Take over-the-counter and prescription medicines only as told by your health care provider.  Avoid putting pressure on the area where the cardiac device was placed.  If you need an MRI after your cardiac device has been placed, be sure to tell the health care provider who orders the MRI that you have a cardiac device.  Avoid close and prolonged exposure to electrical devices that have strong magnetic fields. These  include: ? Cell phones. Avoid keeping them in a pocket near the cardiac device, and try using the ear opposite the cardiac device. ? MP3 players. ? Household appliances, like microwaves. ? Metal detectors. ? Electric generators. ? High-tension wires.  Keep all follow-up visits as directed by your health care provider. This is important. Contact a health care provider if:  You have pain at the incision site that is not relieved by over-the-counter or prescription medicines.  You have any of these around your incision site or coming from it: ? More redness, swelling, or pain. ? Fluid or blood. ? Warmth to the touch. ? Pus or a bad smell.  You have a fever.  You feel brief, occasional palpitations, light-headedness, or any symptoms that you think might be related to your heart. Get help right away if:  You experience chest pain that is different from the pain at the cardiac device site.  You develop a red streak that extends above or below the incision site.  You experience shortness of breath.  You have palpitations or an irregular heartbeat.  You have light-headedness that does not go away quickly.  You faint or have dizzy spells.  Your pulse suddenly drops or increases rapidly and does not return to normal.  You begin to gain weight and your legs and ankles swell. Summary  After your procedure, it is common to have pain, soreness, and some swelling where the cardiac device was inserted.  Make sure to keep your incision clean and dry. Follow instructions from your health care provider about how to take care of your incision.  Check your incision every day for signs of infection, such as more pain or swelling, pus or a bad smell, warmth, or leaking fluid and blood.  Avoid strenuous exercise and lifting your left arm higher than your shoulder for 2 weeks, or as long as told by your health care provider. This information is not intended to replace advice given to you by  your health care provider. Make sure you discuss any questions you have with your health care provider.

## 2019-12-11 NOTE — Interval H&P Note (Signed)
History and Physical Interval Note:  12/11/2019 3:02 PM  Tiffany [name]  has presented today for surgery, with the diagnosis of eri.  The various methods of treatment have been discussed with the patient and family. After consideration of risks, benefits and other options for treatment, the patient has consented to  Procedure(s): PPM GENERATOR CHANGEOUT (N/A) as a surgical intervention.  The patient's history has been reviewed, patient examined, no change in status, stable for surgery.  I have reviewed the patient's chart and labs.  Questions were answered to the patient's satisfaction.     Tiffany [name]  Risks, benefits, and alternatives to pulse generator replacement were discussed in detail today.  The patient understands that risks include but are not limited to bleeding, infection, pneumothorax, perforation, tamponade, vascular damage, renal failure, MI, stroke, death, damage to his existing leads, and lead dislodgement and wishes to proceed.    Thompson Grayer MD, [place] 12/11/2019 3:03 PM

## 2019-12-11 NOTE — Progress Notes (Signed)
Patient was given discharge instructions. She verbalized understanding. 

## 2019-12-11 NOTE — Telephone Encounter (Signed)
Patient recently had gen change completed.

## 2019-12-12 ENCOUNTER — Encounter (HOSPITAL_COMMUNITY): Payer: Self-pay | Admitting: Internal Medicine

## 2019-12-12 NOTE — Telephone Encounter (Signed)
Pt called to return [place] phone call

## 2019-12-12 NOTE — Telephone Encounter (Signed)
See result note.  

## 2019-12-15 ENCOUNTER — Telehealth: Payer: Self-pay

## 2019-12-15 NOTE — Telephone Encounter (Signed)
New Message:    Pt says she would like for Sharman Cheek to give her a call. Please.

## 2019-12-17 ENCOUNTER — Encounter: Payer: Medicare Other | Admitting: Internal Medicine

## 2019-12-22 ENCOUNTER — Encounter: Payer: Medicare Other | Admitting: Internal Medicine

## 2019-12-22 DIAGNOSIS — E1122 Type 2 diabetes mellitus with diabetic chronic kidney disease: Secondary | ICD-10-CM | POA: Diagnosis not present

## 2019-12-22 DIAGNOSIS — I129 Hypertensive chronic kidney disease with stage 1 through stage 4 chronic kidney disease, or unspecified chronic kidney disease: Secondary | ICD-10-CM | POA: Diagnosis not present

## 2019-12-22 DIAGNOSIS — I10 Essential (primary) hypertension: Secondary | ICD-10-CM | POA: Diagnosis not present

## 2019-12-22 DIAGNOSIS — I480 Paroxysmal atrial fibrillation: Secondary | ICD-10-CM | POA: Diagnosis not present

## 2019-12-22 DIAGNOSIS — E039 Hypothyroidism, unspecified: Secondary | ICD-10-CM | POA: Diagnosis not present

## 2019-12-22 DIAGNOSIS — E7800 Pure hypercholesterolemia, unspecified: Secondary | ICD-10-CM | POA: Diagnosis not present

## 2019-12-22 DIAGNOSIS — E1140 Type 2 diabetes mellitus with diabetic neuropathy, unspecified: Secondary | ICD-10-CM | POA: Diagnosis not present

## 2019-12-22 DIAGNOSIS — R54 Age-related physical debility: Secondary | ICD-10-CM | POA: Diagnosis not present

## 2019-12-22 DIAGNOSIS — N184 Chronic kidney disease, stage 4 (severe): Secondary | ICD-10-CM | POA: Diagnosis not present

## 2019-12-22 DIAGNOSIS — D509 Iron deficiency anemia, unspecified: Secondary | ICD-10-CM | POA: Diagnosis not present

## 2019-12-22 DIAGNOSIS — E1142 Type 2 diabetes mellitus with diabetic polyneuropathy: Secondary | ICD-10-CM | POA: Diagnosis not present

## 2019-12-23 ENCOUNTER — Ambulatory Visit (INDEPENDENT_AMBULATORY_CARE_PROVIDER_SITE_OTHER): Payer: Medicare Other | Admitting: Emergency Medicine

## 2019-12-23 ENCOUNTER — Other Ambulatory Visit: Payer: Self-pay

## 2019-12-23 DIAGNOSIS — I495 Sick sinus syndrome: Secondary | ICD-10-CM

## 2019-12-23 LAB — CUP PACEART INCLINIC DEVICE CHECK
Battery Remaining Longevity: 105 mo
Battery Voltage: 3.22 V
Brady Statistic AP VP Percent: 99.93 %
Brady Statistic AP VS Percent: 0 %
Brady Statistic AS VP Percent: 0 %
Brady Statistic AS VS Percent: 0.06 %
Brady Statistic RA Percent Paced: 100 %
Brady Statistic RV Percent Paced: 99.93 %
Date Time Interrogation Session: 20211026150833
Implantable Pulse Generator Implant Date: 20211014
Lead Channel Impedance Value: 209 Ohm
Lead Channel Impedance Value: 247 Ohm
Lead Channel Impedance Value: 247 Ohm
Lead Channel Impedance Value: 247 Ohm
Lead Channel Pacing Threshold Amplitude: 0.25 V
Lead Channel Pacing Threshold Amplitude: 1.25 V
Lead Channel Pacing Threshold Pulse Width: 0.4 ms
Lead Channel Pacing Threshold Pulse Width: 0.4 ms
Lead Channel Setting Pacing Amplitude: 2 V
Lead Channel Setting Pacing Amplitude: 2.5 V
Lead Channel Setting Pacing Pulse Width: 0.4 ms
Lead Channel Setting Sensing Sensitivity: 8 mV

## 2019-12-23 MED ORDER — CEPHALEXIN 500 MG PO CAPS: 500.0000 mg | ORAL_CAPSULE | Freq: Four times a day (QID) | ORAL | 0 refills | Status: DC

## 2019-12-23 NOTE — Progress Notes (Signed)
Wound check appointment. Steri-strips removed. Wound without redness or edema. Incision has approx. 1 cm of slough noted to incision, clear drainage was noted while removing Steri-strips. Dr Quentin Ore in to assess, patient prescribed Kelfex x1 week, wound recheck 12/30/19. Normal device function. 1 alert for Atrial unipolar lead impedance warning on 12/12/2019, impedance stable today. Consulted with Ronalee Belts from [employer], no further recommendations considering this was near date of implant and impedances have been stable since. Histogram distribution appropriate for patient and level of activity. No mode switches or high ventricular rates noted. Patient educated on wound care. ROV in 3 months with implanting physician.

## 2019-12-23 NOTE — Patient Instructions (Signed)
Your prescription has been sent to the Pharmacy. Please start taking Keflex as soon as possible. Please call the device clinic with any concerns or increased swelling, drainage, redness fever or chills. Leave site open to air and do not shower for 2 days.   Device Clinic phone number [phone].

## 2019-12-26 ENCOUNTER — Telehealth: Payer: Self-pay

## 2019-12-26 NOTE — Telephone Encounter (Signed)
The pt called to see if her transmission came through. I told her we did receive it.

## 2019-12-30 ENCOUNTER — Ambulatory Visit (INDEPENDENT_AMBULATORY_CARE_PROVIDER_SITE_OTHER): Payer: Medicare Other | Admitting: Emergency Medicine

## 2019-12-30 ENCOUNTER — Other Ambulatory Visit: Payer: Self-pay

## 2019-12-30 DIAGNOSIS — I495 Sick sinus syndrome: Secondary | ICD-10-CM

## 2019-12-30 NOTE — Progress Notes (Signed)
Wound recheck. No drainage, edema or redness noted at wound site. Healing at distal edge of incision. No pain. Patient to wash area with warm soap and water 2 x aday, finish antibiotics and call clinic if wound site changes. Education done on s/sx of infection. Will return 01/08/20 for recheck when Dr Rayann Heman is in clinic.

## 2019-12-30 NOTE — Patient Instructions (Signed)
Finish all antibiotics. Call the office if you have any swelling, drainage, pain, or change in wound site. Call if you develop a fever or chills. Wash area with antibacterial soap and water twice a day. Return wound check 01/08/20 at [place].  Device [phone]

## 2020-01-06 DIAGNOSIS — Z5181 Encounter for therapeutic drug level monitoring: Secondary | ICD-10-CM | POA: Diagnosis not present

## 2020-01-06 DIAGNOSIS — Z7984 Long term (current) use of oral hypoglycemic drugs: Secondary | ICD-10-CM | POA: Diagnosis not present

## 2020-01-06 DIAGNOSIS — R339 Retention of urine, unspecified: Secondary | ICD-10-CM | POA: Diagnosis not present

## 2020-01-06 DIAGNOSIS — I480 Paroxysmal atrial fibrillation: Secondary | ICD-10-CM | POA: Diagnosis not present

## 2020-01-06 DIAGNOSIS — Z466 Encounter for fitting and adjustment of urinary device: Secondary | ICD-10-CM | POA: Diagnosis not present

## 2020-01-06 DIAGNOSIS — N3946 Mixed incontinence: Secondary | ICD-10-CM | POA: Diagnosis not present

## 2020-01-06 DIAGNOSIS — E1122 Type 2 diabetes mellitus with diabetic chronic kidney disease: Secondary | ICD-10-CM | POA: Diagnosis not present

## 2020-01-06 DIAGNOSIS — I129 Hypertensive chronic kidney disease with stage 1 through stage 4 chronic kidney disease, or unspecified chronic kidney disease: Secondary | ICD-10-CM | POA: Diagnosis not present

## 2020-01-06 DIAGNOSIS — E1142 Type 2 diabetes mellitus with diabetic polyneuropathy: Secondary | ICD-10-CM | POA: Diagnosis not present

## 2020-01-06 DIAGNOSIS — Z7901 Long term (current) use of anticoagulants: Secondary | ICD-10-CM | POA: Diagnosis not present

## 2020-01-07 DIAGNOSIS — E1169 Type 2 diabetes mellitus with other specified complication: Secondary | ICD-10-CM | POA: Diagnosis not present

## 2020-01-07 DIAGNOSIS — D500 Iron deficiency anemia secondary to blood loss (chronic): Secondary | ICD-10-CM | POA: Diagnosis not present

## 2020-01-07 DIAGNOSIS — E1122 Type 2 diabetes mellitus with diabetic chronic kidney disease: Secondary | ICD-10-CM | POA: Diagnosis not present

## 2020-01-07 DIAGNOSIS — I480 Paroxysmal atrial fibrillation: Secondary | ICD-10-CM | POA: Diagnosis not present

## 2020-01-07 DIAGNOSIS — I129 Hypertensive chronic kidney disease with stage 1 through stage 4 chronic kidney disease, or unspecified chronic kidney disease: Secondary | ICD-10-CM | POA: Diagnosis not present

## 2020-01-07 DIAGNOSIS — I10 Essential (primary) hypertension: Secondary | ICD-10-CM | POA: Diagnosis not present

## 2020-01-07 DIAGNOSIS — E1142 Type 2 diabetes mellitus with diabetic polyneuropathy: Secondary | ICD-10-CM | POA: Diagnosis not present

## 2020-01-07 DIAGNOSIS — E039 Hypothyroidism, unspecified: Secondary | ICD-10-CM | POA: Diagnosis not present

## 2020-01-07 DIAGNOSIS — R54 Age-related physical debility: Secondary | ICD-10-CM | POA: Diagnosis not present

## 2020-01-07 DIAGNOSIS — D509 Iron deficiency anemia, unspecified: Secondary | ICD-10-CM | POA: Diagnosis not present

## 2020-01-07 DIAGNOSIS — N183 Chronic kidney disease, stage 3 unspecified: Secondary | ICD-10-CM | POA: Diagnosis not present

## 2020-01-07 DIAGNOSIS — E7800 Pure hypercholesterolemia, unspecified: Secondary | ICD-10-CM | POA: Diagnosis not present

## 2020-01-08 ENCOUNTER — Other Ambulatory Visit: Payer: Self-pay

## 2020-01-08 ENCOUNTER — Ambulatory Visit (INDEPENDENT_AMBULATORY_CARE_PROVIDER_SITE_OTHER): Payer: Medicare Other | Admitting: Emergency Medicine

## 2020-01-08 DIAGNOSIS — Z466 Encounter for fitting and adjustment of urinary device: Secondary | ICD-10-CM | POA: Diagnosis not present

## 2020-01-08 DIAGNOSIS — I129 Hypertensive chronic kidney disease with stage 1 through stage 4 chronic kidney disease, or unspecified chronic kidney disease: Secondary | ICD-10-CM | POA: Diagnosis not present

## 2020-01-08 DIAGNOSIS — R339 Retention of urine, unspecified: Secondary | ICD-10-CM | POA: Diagnosis not present

## 2020-01-08 DIAGNOSIS — E1122 Type 2 diabetes mellitus with diabetic chronic kidney disease: Secondary | ICD-10-CM | POA: Diagnosis not present

## 2020-01-08 DIAGNOSIS — N3946 Mixed incontinence: Secondary | ICD-10-CM | POA: Diagnosis not present

## 2020-01-08 DIAGNOSIS — Z950 Presence of cardiac pacemaker: Secondary | ICD-10-CM

## 2020-01-08 DIAGNOSIS — I480 Paroxysmal atrial fibrillation: Secondary | ICD-10-CM | POA: Diagnosis not present

## 2020-01-08 NOTE — Progress Notes (Signed)
Patient in office for wound recheck.  Area appears improved.  No sign of slough in incision.  Area continues scabbed.  Educated patient to continue washing with soap and water twice daily allowing scab to fall off naturally.

## 2020-01-14 DIAGNOSIS — I480 Paroxysmal atrial fibrillation: Secondary | ICD-10-CM | POA: Diagnosis not present

## 2020-01-14 DIAGNOSIS — N3946 Mixed incontinence: Secondary | ICD-10-CM | POA: Diagnosis not present

## 2020-01-14 DIAGNOSIS — Z466 Encounter for fitting and adjustment of urinary device: Secondary | ICD-10-CM | POA: Diagnosis not present

## 2020-01-14 DIAGNOSIS — I129 Hypertensive chronic kidney disease with stage 1 through stage 4 chronic kidney disease, or unspecified chronic kidney disease: Secondary | ICD-10-CM | POA: Diagnosis not present

## 2020-01-14 DIAGNOSIS — R339 Retention of urine, unspecified: Secondary | ICD-10-CM | POA: Diagnosis not present

## 2020-01-14 DIAGNOSIS — E1122 Type 2 diabetes mellitus with diabetic chronic kidney disease: Secondary | ICD-10-CM | POA: Diagnosis not present

## 2020-02-02 DIAGNOSIS — Z466 Encounter for fitting and adjustment of urinary device: Secondary | ICD-10-CM | POA: Diagnosis not present

## 2020-02-02 DIAGNOSIS — I129 Hypertensive chronic kidney disease with stage 1 through stage 4 chronic kidney disease, or unspecified chronic kidney disease: Secondary | ICD-10-CM | POA: Diagnosis not present

## 2020-02-02 DIAGNOSIS — R339 Retention of urine, unspecified: Secondary | ICD-10-CM | POA: Diagnosis not present

## 2020-02-02 DIAGNOSIS — N3946 Mixed incontinence: Secondary | ICD-10-CM | POA: Diagnosis not present

## 2020-02-02 DIAGNOSIS — E1122 Type 2 diabetes mellitus with diabetic chronic kidney disease: Secondary | ICD-10-CM | POA: Diagnosis not present

## 2020-02-02 DIAGNOSIS — I480 Paroxysmal atrial fibrillation: Secondary | ICD-10-CM | POA: Diagnosis not present

## 2020-02-05 DIAGNOSIS — E1142 Type 2 diabetes mellitus with diabetic polyneuropathy: Secondary | ICD-10-CM | POA: Diagnosis not present

## 2020-02-05 DIAGNOSIS — L89152 Pressure ulcer of sacral region, stage 2: Secondary | ICD-10-CM | POA: Diagnosis not present

## 2020-02-05 DIAGNOSIS — Z466 Encounter for fitting and adjustment of urinary device: Secondary | ICD-10-CM | POA: Diagnosis not present

## 2020-02-05 DIAGNOSIS — I480 Paroxysmal atrial fibrillation: Secondary | ICD-10-CM | POA: Diagnosis not present

## 2020-02-05 DIAGNOSIS — Z794 Long term (current) use of insulin: Secondary | ICD-10-CM | POA: Diagnosis not present

## 2020-02-05 DIAGNOSIS — R339 Retention of urine, unspecified: Secondary | ICD-10-CM | POA: Diagnosis not present

## 2020-02-05 DIAGNOSIS — I129 Hypertensive chronic kidney disease with stage 1 through stage 4 chronic kidney disease, or unspecified chronic kidney disease: Secondary | ICD-10-CM | POA: Diagnosis not present

## 2020-02-05 DIAGNOSIS — Z5181 Encounter for therapeutic drug level monitoring: Secondary | ICD-10-CM | POA: Diagnosis not present

## 2020-02-05 DIAGNOSIS — Z7901 Long term (current) use of anticoagulants: Secondary | ICD-10-CM | POA: Diagnosis not present

## 2020-02-05 DIAGNOSIS — E1122 Type 2 diabetes mellitus with diabetic chronic kidney disease: Secondary | ICD-10-CM | POA: Diagnosis not present

## 2020-02-05 DIAGNOSIS — N3946 Mixed incontinence: Secondary | ICD-10-CM | POA: Diagnosis not present

## 2020-02-06 DIAGNOSIS — L89152 Pressure ulcer of sacral region, stage 2: Secondary | ICD-10-CM | POA: Diagnosis not present

## 2020-02-06 DIAGNOSIS — N3946 Mixed incontinence: Secondary | ICD-10-CM | POA: Diagnosis not present

## 2020-02-06 DIAGNOSIS — Z466 Encounter for fitting and adjustment of urinary device: Secondary | ICD-10-CM | POA: Diagnosis not present

## 2020-02-06 DIAGNOSIS — R339 Retention of urine, unspecified: Secondary | ICD-10-CM | POA: Diagnosis not present

## 2020-02-06 DIAGNOSIS — E1122 Type 2 diabetes mellitus with diabetic chronic kidney disease: Secondary | ICD-10-CM | POA: Diagnosis not present

## 2020-02-06 DIAGNOSIS — I480 Paroxysmal atrial fibrillation: Secondary | ICD-10-CM | POA: Diagnosis not present

## 2020-02-11 DIAGNOSIS — I480 Paroxysmal atrial fibrillation: Secondary | ICD-10-CM | POA: Diagnosis not present

## 2020-02-11 DIAGNOSIS — R339 Retention of urine, unspecified: Secondary | ICD-10-CM | POA: Diagnosis not present

## 2020-02-11 DIAGNOSIS — N3946 Mixed incontinence: Secondary | ICD-10-CM | POA: Diagnosis not present

## 2020-02-11 DIAGNOSIS — Z466 Encounter for fitting and adjustment of urinary device: Secondary | ICD-10-CM | POA: Diagnosis not present

## 2020-02-11 DIAGNOSIS — E1122 Type 2 diabetes mellitus with diabetic chronic kidney disease: Secondary | ICD-10-CM | POA: Diagnosis not present

## 2020-02-11 DIAGNOSIS — L89152 Pressure ulcer of sacral region, stage 2: Secondary | ICD-10-CM | POA: Diagnosis not present

## 2020-02-17 DIAGNOSIS — Z466 Encounter for fitting and adjustment of urinary device: Secondary | ICD-10-CM | POA: Diagnosis not present

## 2020-02-17 DIAGNOSIS — E1122 Type 2 diabetes mellitus with diabetic chronic kidney disease: Secondary | ICD-10-CM | POA: Diagnosis not present

## 2020-02-17 DIAGNOSIS — R339 Retention of urine, unspecified: Secondary | ICD-10-CM | POA: Diagnosis not present

## 2020-02-17 DIAGNOSIS — L89152 Pressure ulcer of sacral region, stage 2: Secondary | ICD-10-CM | POA: Diagnosis not present

## 2020-02-17 DIAGNOSIS — N3946 Mixed incontinence: Secondary | ICD-10-CM | POA: Diagnosis not present

## 2020-02-17 DIAGNOSIS — I480 Paroxysmal atrial fibrillation: Secondary | ICD-10-CM | POA: Diagnosis not present

## 2020-02-24 ENCOUNTER — Telehealth: Payer: Self-pay | Admitting: Internal Medicine

## 2020-02-24 NOTE — Telephone Encounter (Signed)
Returning patients phone call. Patient reports her wound is completely. Advised patient she can wash site with warm soapy water and pay dry. Be sure to use clean wash cloths each time. Advised to call with any questions or concerns.

## 2020-02-24 NOTE — Telephone Encounter (Signed)
Patient is calling to inform Dr. Nathen May clinic that during her shower this morning, 02/24/20, the red piece at the site of her pacemaker incision fell off. She states the wound is now completely closed and she would like to know if she still needs to wash around the area twice daily. Please advise.

## 2020-02-25 DIAGNOSIS — E1122 Type 2 diabetes mellitus with diabetic chronic kidney disease: Secondary | ICD-10-CM | POA: Diagnosis not present

## 2020-02-25 DIAGNOSIS — I4891 Unspecified atrial fibrillation: Secondary | ICD-10-CM | POA: Diagnosis not present

## 2020-02-25 DIAGNOSIS — N184 Chronic kidney disease, stage 4 (severe): Secondary | ICD-10-CM | POA: Diagnosis not present

## 2020-02-25 DIAGNOSIS — D509 Iron deficiency anemia, unspecified: Secondary | ICD-10-CM | POA: Diagnosis not present

## 2020-02-25 DIAGNOSIS — E039 Hypothyroidism, unspecified: Secondary | ICD-10-CM | POA: Diagnosis not present

## 2020-02-25 DIAGNOSIS — R54 Age-related physical debility: Secondary | ICD-10-CM | POA: Diagnosis not present

## 2020-02-25 DIAGNOSIS — E7800 Pure hypercholesterolemia, unspecified: Secondary | ICD-10-CM | POA: Diagnosis not present

## 2020-02-25 DIAGNOSIS — E1169 Type 2 diabetes mellitus with other specified complication: Secondary | ICD-10-CM | POA: Diagnosis not present

## 2020-02-25 DIAGNOSIS — K219 Gastro-esophageal reflux disease without esophagitis: Secondary | ICD-10-CM | POA: Diagnosis not present

## 2020-02-25 DIAGNOSIS — E1140 Type 2 diabetes mellitus with diabetic neuropathy, unspecified: Secondary | ICD-10-CM | POA: Diagnosis not present

## 2020-02-25 DIAGNOSIS — I10 Essential (primary) hypertension: Secondary | ICD-10-CM | POA: Diagnosis not present

## 2020-02-26 DIAGNOSIS — R339 Retention of urine, unspecified: Secondary | ICD-10-CM | POA: Diagnosis not present

## 2020-02-26 DIAGNOSIS — Z466 Encounter for fitting and adjustment of urinary device: Secondary | ICD-10-CM | POA: Diagnosis not present

## 2020-02-26 DIAGNOSIS — I480 Paroxysmal atrial fibrillation: Secondary | ICD-10-CM | POA: Diagnosis not present

## 2020-02-26 DIAGNOSIS — E1122 Type 2 diabetes mellitus with diabetic chronic kidney disease: Secondary | ICD-10-CM | POA: Diagnosis not present

## 2020-02-26 DIAGNOSIS — L89152 Pressure ulcer of sacral region, stage 2: Secondary | ICD-10-CM | POA: Diagnosis not present

## 2020-02-26 DIAGNOSIS — N3946 Mixed incontinence: Secondary | ICD-10-CM | POA: Diagnosis not present

## 2020-03-02 DIAGNOSIS — E1122 Type 2 diabetes mellitus with diabetic chronic kidney disease: Secondary | ICD-10-CM | POA: Diagnosis not present

## 2020-03-02 DIAGNOSIS — L89152 Pressure ulcer of sacral region, stage 2: Secondary | ICD-10-CM | POA: Diagnosis not present

## 2020-03-02 DIAGNOSIS — N3946 Mixed incontinence: Secondary | ICD-10-CM | POA: Diagnosis not present

## 2020-03-02 DIAGNOSIS — R339 Retention of urine, unspecified: Secondary | ICD-10-CM | POA: Diagnosis not present

## 2020-03-02 DIAGNOSIS — I480 Paroxysmal atrial fibrillation: Secondary | ICD-10-CM | POA: Diagnosis not present

## 2020-03-02 DIAGNOSIS — Z466 Encounter for fitting and adjustment of urinary device: Secondary | ICD-10-CM | POA: Diagnosis not present

## 2020-03-06 DIAGNOSIS — Z7901 Long term (current) use of anticoagulants: Secondary | ICD-10-CM | POA: Diagnosis not present

## 2020-03-06 DIAGNOSIS — Z794 Long term (current) use of insulin: Secondary | ICD-10-CM | POA: Diagnosis not present

## 2020-03-06 DIAGNOSIS — R339 Retention of urine, unspecified: Secondary | ICD-10-CM | POA: Diagnosis not present

## 2020-03-06 DIAGNOSIS — Z466 Encounter for fitting and adjustment of urinary device: Secondary | ICD-10-CM | POA: Diagnosis not present

## 2020-03-06 DIAGNOSIS — E1122 Type 2 diabetes mellitus with diabetic chronic kidney disease: Secondary | ICD-10-CM | POA: Diagnosis not present

## 2020-03-06 DIAGNOSIS — I480 Paroxysmal atrial fibrillation: Secondary | ICD-10-CM | POA: Diagnosis not present

## 2020-03-06 DIAGNOSIS — E1142 Type 2 diabetes mellitus with diabetic polyneuropathy: Secondary | ICD-10-CM | POA: Diagnosis not present

## 2020-03-06 DIAGNOSIS — I129 Hypertensive chronic kidney disease with stage 1 through stage 4 chronic kidney disease, or unspecified chronic kidney disease: Secondary | ICD-10-CM | POA: Diagnosis not present

## 2020-03-06 DIAGNOSIS — Z5181 Encounter for therapeutic drug level monitoring: Secondary | ICD-10-CM | POA: Diagnosis not present

## 2020-03-06 DIAGNOSIS — N3946 Mixed incontinence: Secondary | ICD-10-CM | POA: Diagnosis not present

## 2020-03-06 DIAGNOSIS — L89152 Pressure ulcer of sacral region, stage 2: Secondary | ICD-10-CM | POA: Diagnosis not present

## 2020-03-08 DIAGNOSIS — E1122 Type 2 diabetes mellitus with diabetic chronic kidney disease: Secondary | ICD-10-CM | POA: Diagnosis not present

## 2020-03-08 DIAGNOSIS — N3946 Mixed incontinence: Secondary | ICD-10-CM | POA: Diagnosis not present

## 2020-03-08 DIAGNOSIS — I480 Paroxysmal atrial fibrillation: Secondary | ICD-10-CM | POA: Diagnosis not present

## 2020-03-08 DIAGNOSIS — L89152 Pressure ulcer of sacral region, stage 2: Secondary | ICD-10-CM | POA: Diagnosis not present

## 2020-03-08 DIAGNOSIS — R339 Retention of urine, unspecified: Secondary | ICD-10-CM | POA: Diagnosis not present

## 2020-03-08 DIAGNOSIS — Z466 Encounter for fitting and adjustment of urinary device: Secondary | ICD-10-CM | POA: Diagnosis not present

## 2020-03-15 ENCOUNTER — Encounter: Payer: Medicare Other | Admitting: Internal Medicine

## 2020-03-16 ENCOUNTER — Telehealth: Payer: Self-pay | Admitting: Internal Medicine

## 2020-03-16 NOTE — Telephone Encounter (Signed)
Patient had questions about how to care for the site where her pacemaker was put in. She was supposed to have an appointment 1/17/2 but it was postponed due to weather. Please advise

## 2020-03-16 NOTE — Telephone Encounter (Signed)
Returning phone call. Patient advised she can use warm soapy water with clean wash cloth to clean area while showering. Denies any issues with her site. Aware of follow up apt 04/12/20.

## 2020-03-18 DIAGNOSIS — N3946 Mixed incontinence: Secondary | ICD-10-CM | POA: Diagnosis not present

## 2020-03-18 DIAGNOSIS — Z466 Encounter for fitting and adjustment of urinary device: Secondary | ICD-10-CM | POA: Diagnosis not present

## 2020-03-18 DIAGNOSIS — R339 Retention of urine, unspecified: Secondary | ICD-10-CM | POA: Diagnosis not present

## 2020-03-18 DIAGNOSIS — L89152 Pressure ulcer of sacral region, stage 2: Secondary | ICD-10-CM | POA: Diagnosis not present

## 2020-03-18 DIAGNOSIS — I480 Paroxysmal atrial fibrillation: Secondary | ICD-10-CM | POA: Diagnosis not present

## 2020-03-18 DIAGNOSIS — E1122 Type 2 diabetes mellitus with diabetic chronic kidney disease: Secondary | ICD-10-CM | POA: Diagnosis not present

## 2020-03-23 ENCOUNTER — Ambulatory Visit (INDEPENDENT_AMBULATORY_CARE_PROVIDER_SITE_OTHER): Payer: Medicare Other

## 2020-03-23 DIAGNOSIS — I495 Sick sinus syndrome: Secondary | ICD-10-CM | POA: Diagnosis not present

## 2020-03-23 LAB — CUP PACEART REMOTE DEVICE CHECK
Battery Remaining Longevity: 100 mo
Battery Voltage: 3.18 V
Brady Statistic AP VP Percent: 99.97 %
Brady Statistic AP VS Percent: 0.01 %
Brady Statistic AS VP Percent: 0 %
Brady Statistic AS VS Percent: 0.02 %
Brady Statistic RA Percent Paced: 100 %
Brady Statistic RV Percent Paced: 99.97 %
Date Time Interrogation Session: 20220124182039
Implantable Lead Implant Date: 19990907
Implantable Lead Implant Date: 19990907
Implantable Lead Location: 753859
Implantable Lead Location: 753860
Implantable Lead Serial Number: 32904
Implantable Lead Serial Number: 33421
Implantable Pulse Generator Implant Date: 20211014
Lead Channel Impedance Value: 228 Ohm
Lead Channel Impedance Value: 247 Ohm
Lead Channel Impedance Value: 247 Ohm
Lead Channel Impedance Value: 266 Ohm
Lead Channel Pacing Threshold Amplitude: 1.125 V
Lead Channel Pacing Threshold Pulse Width: 0.4 ms
Lead Channel Setting Pacing Amplitude: 2 V
Lead Channel Setting Pacing Amplitude: 2.5 V
Lead Channel Setting Pacing Pulse Width: 0.4 ms
Lead Channel Setting Sensing Sensitivity: 8 mV

## 2020-03-25 DIAGNOSIS — R339 Retention of urine, unspecified: Secondary | ICD-10-CM | POA: Diagnosis not present

## 2020-03-25 DIAGNOSIS — N3946 Mixed incontinence: Secondary | ICD-10-CM | POA: Diagnosis not present

## 2020-03-25 DIAGNOSIS — Z466 Encounter for fitting and adjustment of urinary device: Secondary | ICD-10-CM | POA: Diagnosis not present

## 2020-03-25 DIAGNOSIS — E1122 Type 2 diabetes mellitus with diabetic chronic kidney disease: Secondary | ICD-10-CM | POA: Diagnosis not present

## 2020-03-25 DIAGNOSIS — I480 Paroxysmal atrial fibrillation: Secondary | ICD-10-CM | POA: Diagnosis not present

## 2020-03-25 DIAGNOSIS — L89152 Pressure ulcer of sacral region, stage 2: Secondary | ICD-10-CM | POA: Diagnosis not present

## 2020-03-31 DIAGNOSIS — D509 Iron deficiency anemia, unspecified: Secondary | ICD-10-CM | POA: Diagnosis not present

## 2020-03-31 DIAGNOSIS — E039 Hypothyroidism, unspecified: Secondary | ICD-10-CM | POA: Diagnosis not present

## 2020-03-31 DIAGNOSIS — I129 Hypertensive chronic kidney disease with stage 1 through stage 4 chronic kidney disease, or unspecified chronic kidney disease: Secondary | ICD-10-CM | POA: Diagnosis not present

## 2020-03-31 DIAGNOSIS — K219 Gastro-esophageal reflux disease without esophagitis: Secondary | ICD-10-CM | POA: Diagnosis not present

## 2020-03-31 DIAGNOSIS — E7800 Pure hypercholesterolemia, unspecified: Secondary | ICD-10-CM | POA: Diagnosis not present

## 2020-03-31 DIAGNOSIS — I10 Essential (primary) hypertension: Secondary | ICD-10-CM | POA: Diagnosis not present

## 2020-03-31 DIAGNOSIS — N184 Chronic kidney disease, stage 4 (severe): Secondary | ICD-10-CM | POA: Diagnosis not present

## 2020-03-31 DIAGNOSIS — E1122 Type 2 diabetes mellitus with diabetic chronic kidney disease: Secondary | ICD-10-CM | POA: Diagnosis not present

## 2020-03-31 DIAGNOSIS — I4891 Unspecified atrial fibrillation: Secondary | ICD-10-CM | POA: Diagnosis not present

## 2020-03-31 DIAGNOSIS — E1142 Type 2 diabetes mellitus with diabetic polyneuropathy: Secondary | ICD-10-CM | POA: Diagnosis not present

## 2020-04-01 DIAGNOSIS — I480 Paroxysmal atrial fibrillation: Secondary | ICD-10-CM | POA: Diagnosis not present

## 2020-04-01 DIAGNOSIS — N3946 Mixed incontinence: Secondary | ICD-10-CM | POA: Diagnosis not present

## 2020-04-01 DIAGNOSIS — E1122 Type 2 diabetes mellitus with diabetic chronic kidney disease: Secondary | ICD-10-CM | POA: Diagnosis not present

## 2020-04-01 DIAGNOSIS — L89152 Pressure ulcer of sacral region, stage 2: Secondary | ICD-10-CM | POA: Diagnosis not present

## 2020-04-01 DIAGNOSIS — Z466 Encounter for fitting and adjustment of urinary device: Secondary | ICD-10-CM | POA: Diagnosis not present

## 2020-04-01 DIAGNOSIS — R339 Retention of urine, unspecified: Secondary | ICD-10-CM | POA: Diagnosis not present

## 2020-04-02 ENCOUNTER — Telehealth: Payer: Self-pay | Admitting: Internal Medicine

## 2020-04-02 NOTE — Telephone Encounter (Signed)
Patient states she has an appointment 04/12/2020 with Dr. Rayann Heman. She states her kidney doctor wants her to have lab work done and wants to know how long that will take to get it done on the same day.

## 2020-04-02 NOTE — Telephone Encounter (Signed)
Pt aware  that we can do labs in our office if wishes to Pt understands this .Adonis Housekeeper

## 2020-04-05 DIAGNOSIS — Z5181 Encounter for therapeutic drug level monitoring: Secondary | ICD-10-CM | POA: Diagnosis not present

## 2020-04-05 DIAGNOSIS — R339 Retention of urine, unspecified: Secondary | ICD-10-CM | POA: Diagnosis not present

## 2020-04-05 DIAGNOSIS — E1142 Type 2 diabetes mellitus with diabetic polyneuropathy: Secondary | ICD-10-CM | POA: Diagnosis not present

## 2020-04-05 DIAGNOSIS — Z7901 Long term (current) use of anticoagulants: Secondary | ICD-10-CM | POA: Diagnosis not present

## 2020-04-05 DIAGNOSIS — Z794 Long term (current) use of insulin: Secondary | ICD-10-CM | POA: Diagnosis not present

## 2020-04-05 DIAGNOSIS — N3946 Mixed incontinence: Secondary | ICD-10-CM | POA: Diagnosis not present

## 2020-04-05 DIAGNOSIS — L89152 Pressure ulcer of sacral region, stage 2: Secondary | ICD-10-CM | POA: Diagnosis not present

## 2020-04-05 DIAGNOSIS — Z466 Encounter for fitting and adjustment of urinary device: Secondary | ICD-10-CM | POA: Diagnosis not present

## 2020-04-05 DIAGNOSIS — I480 Paroxysmal atrial fibrillation: Secondary | ICD-10-CM | POA: Diagnosis not present

## 2020-04-05 DIAGNOSIS — I129 Hypertensive chronic kidney disease with stage 1 through stage 4 chronic kidney disease, or unspecified chronic kidney disease: Secondary | ICD-10-CM | POA: Diagnosis not present

## 2020-04-05 DIAGNOSIS — E1122 Type 2 diabetes mellitus with diabetic chronic kidney disease: Secondary | ICD-10-CM | POA: Diagnosis not present

## 2020-04-05 NOTE — Progress Notes (Signed)
Remote pacemaker transmission.   

## 2020-04-07 DIAGNOSIS — N3946 Mixed incontinence: Secondary | ICD-10-CM | POA: Diagnosis not present

## 2020-04-07 DIAGNOSIS — Z466 Encounter for fitting and adjustment of urinary device: Secondary | ICD-10-CM | POA: Diagnosis not present

## 2020-04-07 DIAGNOSIS — R339 Retention of urine, unspecified: Secondary | ICD-10-CM | POA: Diagnosis not present

## 2020-04-07 DIAGNOSIS — I480 Paroxysmal atrial fibrillation: Secondary | ICD-10-CM | POA: Diagnosis not present

## 2020-04-07 DIAGNOSIS — L89152 Pressure ulcer of sacral region, stage 2: Secondary | ICD-10-CM | POA: Diagnosis not present

## 2020-04-07 DIAGNOSIS — E1122 Type 2 diabetes mellitus with diabetic chronic kidney disease: Secondary | ICD-10-CM | POA: Diagnosis not present

## 2020-04-12 ENCOUNTER — Ambulatory Visit (INDEPENDENT_AMBULATORY_CARE_PROVIDER_SITE_OTHER): Payer: Medicare Other | Admitting: Internal Medicine

## 2020-04-12 ENCOUNTER — Other Ambulatory Visit: Payer: Self-pay

## 2020-04-12 ENCOUNTER — Encounter: Payer: Self-pay | Admitting: Internal Medicine

## 2020-04-12 VITALS — BP 112/58 | HR 80 | Ht 63.0 in | Wt 154.4 lb

## 2020-04-12 DIAGNOSIS — I495 Sick sinus syndrome: Secondary | ICD-10-CM

## 2020-04-12 DIAGNOSIS — I10 Essential (primary) hypertension: Secondary | ICD-10-CM

## 2020-04-12 DIAGNOSIS — I480 Paroxysmal atrial fibrillation: Secondary | ICD-10-CM

## 2020-04-12 NOTE — Patient Instructions (Addendum)
Medication Instructions:  Your physician recommends that you continue on your current medications as directed. Please refer to the Current Medication list given to you today.  Labwork: None ordered.  Testing/Procedures: None ordered.  Follow-Up: Your physician wants you to follow-up in: one year with     Renee Ursuy, PA-C     You will receive a reminder letter in the mail two months in advance. If you don't receive a letter, please call our office to schedule the follow-up appointment.  Any Other Special Instructions Will Be Listed Below (If Applicable).  If you need a refill on your cardiac medications before your next appointment, please call your pharmacy.         

## 2020-04-12 NOTE — Progress Notes (Signed)
PCP: Antony Contras, MD   Primary EP:  Tiffany [name] is a 87 y.o. female who presents today for routine electrophysiology followup.  Since her pacemaker generator change, the patient reports doing very well.  Today, she denies symptoms of palpitations, chest pain, shortness of breath,  lower extremity edema, dizziness, presyncope, or syncope.  The patient is otherwise without complaint today.   Past Medical History:  Diagnosis Date  . Diverticulosis   . DM2 (diabetes mellitus, type 2) ([place])   . Esophageal reflux   . HTN (hypertension)   . Hyperlipidemia   . Hypothyroidism   . Microalbuminuria    last done in 1/07   . Paroxysmal atrial fibrillation (HCC)    and atrial tachycardia  . Stroke ([place]) 1999  . Tachycardia-bradycardia syndrome ([place])    s/p PPM  . Unspecified hypothyroidism 11/20/2013   Past Surgical History:  Procedure Laterality Date  . CHOLECYSTECTOMY    . COLONOSCOPY N/A 11/21/2013   Procedure: COLONOSCOPY;  Surgeon: Jeryl Columbia, MD;  Location: [facility] ENDOSCOPY;  Service: Endoscopy;  Laterality: N/A;  . HEMORRHOID SURGERY    . hysterectomy (other)    . PACEMAKER GENERATOR CHANGE  10/03/12   DMT Advisa Tiffany gen change by Tiffany Rayann Heman  . PACEMAKER GENERATOR CHANGE N/A 10/03/2012   Procedure: PACEMAKER GENERATOR CHANGE;  Surgeon: Thompson Grayer, MD;  Location: [facility] CATH LAB;  Service: Cardiovascular;  Laterality: N/A;  . permanent pacemaker  1999   updated 2006 (MDT) by Tiffany Verlon Setting; gen change 09-2012 by Tiffany Rayann Heman (MDT)  . PPM GENERATOR CHANGEOUT N/A 12/11/2019   Procedure: PPM GENERATOR CHANGEOUT;  Surgeon: Thompson Grayer, MD;  Location: [place] CV LAB;  Service: Cardiovascular;  Laterality: N/A;    ROS- all systems are reviewed and negative except as per HPI above  Current Outpatient Medications  Medication Sig Dispense Refill  . cephALEXin (KEFLEX) 500 MG capsule Take 1 capsule (500 mg total) by mouth 4 (four) times daily. 28 capsule 0  . diltiazem  (CARDIZEM CD) 180 MG 24 hr capsule Take 180 mg by mouth daily.     . FERGON 240 (27 FE) MG tablet Take 240 mg by mouth every evening.   3  . furosemide (LASIX) 20 MG tablet Take 20 mg by mouth daily.    Marland Kitchen glipiZIDE (GLUCOTROL) 5 MG tablet Take 5 mg by mouth daily before breakfast.     . levothyroxine (SYNTHROID, LEVOTHROID) 50 MCG tablet Take 50 mcg by mouth daily before breakfast.     . Multiple Vitamins-Minerals (MULTIVITAMIN WITH MINERALS) tablet Take 1 tablet by mouth daily.    . potassium chloride SA (K-DUR,KLOR-CON) 20 MEQ tablet Take 20 mEq by mouth daily.    . rosuvastatin (CRESTOR) 10 MG tablet Take 10 mg by mouth daily.    . Semaglutide,0.25 or 0.5MG /DOS, 2 MG/1.5ML SOPN Inject 0.25 mg into the skin once a week.    . warfarin (COUMADIN) 2 MG tablet Take 2 mg by mouth daily at 4 PM.      No current facility-administered medications for this visit.    Physical Exam: Vitals:   04/12/20 1501  BP: (!) 112/58  Pulse: 80  SpO2: 98%  Weight: 154 lb 6.4 oz (70 kg)  Height: 5\' 3"  (1.6 m)    GEN- The patient is well appearing, alert and oriented x 3 today.   Head- normocephalic, atraumatic Eyes-  Sclera clear, conjunctiva pink Ears- hearing intact Oropharynx- clear Lungs- Clear to ausculation bilaterally,  normal work of breathing Chest- pacemaker pocket is well healed Heart- Regular rate and rhythm, no murmurs, rubs or gallops, PMI not laterally displaced GI- soft, NT, ND, + BS Extremities- no clubbing, cyanosis, or edema  Pacemaker interrogation- reviewed in detail today,  See PACEART report  ekg tracing ordered today is personally reviewed and shows AV paced  Assessment and Plan:  1. Symptomatic sinus bradycardia and complete heart block Normal pacemaker function See Pace Art report No changes today she is device dependant today  2. HTN Stable No change required today  3. Paroxysmal atrial fibrillation No afib in 4 years.  She is on coumadin for chads2vasc score  of 6.  Risks, benefits and potential toxicities for medications prescribed and/or refilled reviewed with patient today.   Return to see EP PA in a year  Thompson Grayer MD, [facility] 04/12/2020 3:38 PM

## 2020-04-13 DIAGNOSIS — E1122 Type 2 diabetes mellitus with diabetic chronic kidney disease: Secondary | ICD-10-CM | POA: Diagnosis not present

## 2020-04-13 DIAGNOSIS — I480 Paroxysmal atrial fibrillation: Secondary | ICD-10-CM | POA: Diagnosis not present

## 2020-04-13 DIAGNOSIS — Z466 Encounter for fitting and adjustment of urinary device: Secondary | ICD-10-CM | POA: Diagnosis not present

## 2020-04-13 DIAGNOSIS — R339 Retention of urine, unspecified: Secondary | ICD-10-CM | POA: Diagnosis not present

## 2020-04-13 DIAGNOSIS — L89152 Pressure ulcer of sacral region, stage 2: Secondary | ICD-10-CM | POA: Diagnosis not present

## 2020-04-13 DIAGNOSIS — N3946 Mixed incontinence: Secondary | ICD-10-CM | POA: Diagnosis not present

## 2020-04-15 DIAGNOSIS — H3562 Retinal hemorrhage, left eye: Secondary | ICD-10-CM | POA: Diagnosis not present

## 2020-04-15 DIAGNOSIS — E113293 Type 2 diabetes mellitus with mild nonproliferative diabetic retinopathy without macular edema, bilateral: Secondary | ICD-10-CM | POA: Diagnosis not present

## 2020-04-19 DIAGNOSIS — R339 Retention of urine, unspecified: Secondary | ICD-10-CM | POA: Diagnosis not present

## 2020-04-19 DIAGNOSIS — Z466 Encounter for fitting and adjustment of urinary device: Secondary | ICD-10-CM | POA: Diagnosis not present

## 2020-04-19 DIAGNOSIS — N3946 Mixed incontinence: Secondary | ICD-10-CM | POA: Diagnosis not present

## 2020-04-19 DIAGNOSIS — L89152 Pressure ulcer of sacral region, stage 2: Secondary | ICD-10-CM | POA: Diagnosis not present

## 2020-04-19 DIAGNOSIS — E1122 Type 2 diabetes mellitus with diabetic chronic kidney disease: Secondary | ICD-10-CM | POA: Diagnosis not present

## 2020-04-19 DIAGNOSIS — I480 Paroxysmal atrial fibrillation: Secondary | ICD-10-CM | POA: Diagnosis not present

## 2020-04-20 DIAGNOSIS — L89152 Pressure ulcer of sacral region, stage 2: Secondary | ICD-10-CM | POA: Diagnosis not present

## 2020-04-20 DIAGNOSIS — I480 Paroxysmal atrial fibrillation: Secondary | ICD-10-CM | POA: Diagnosis not present

## 2020-04-20 DIAGNOSIS — Z466 Encounter for fitting and adjustment of urinary device: Secondary | ICD-10-CM | POA: Diagnosis not present

## 2020-04-20 DIAGNOSIS — R339 Retention of urine, unspecified: Secondary | ICD-10-CM | POA: Diagnosis not present

## 2020-04-20 DIAGNOSIS — N3946 Mixed incontinence: Secondary | ICD-10-CM | POA: Diagnosis not present

## 2020-04-20 DIAGNOSIS — E1122 Type 2 diabetes mellitus with diabetic chronic kidney disease: Secondary | ICD-10-CM | POA: Diagnosis not present

## 2020-04-26 DIAGNOSIS — N184 Chronic kidney disease, stage 4 (severe): Secondary | ICD-10-CM | POA: Diagnosis not present

## 2020-04-26 DIAGNOSIS — Z466 Encounter for fitting and adjustment of urinary device: Secondary | ICD-10-CM | POA: Diagnosis not present

## 2020-04-26 DIAGNOSIS — R339 Retention of urine, unspecified: Secondary | ICD-10-CM | POA: Diagnosis not present

## 2020-04-26 DIAGNOSIS — N3946 Mixed incontinence: Secondary | ICD-10-CM | POA: Diagnosis not present

## 2020-04-26 DIAGNOSIS — E1122 Type 2 diabetes mellitus with diabetic chronic kidney disease: Secondary | ICD-10-CM | POA: Diagnosis not present

## 2020-04-26 DIAGNOSIS — L89152 Pressure ulcer of sacral region, stage 2: Secondary | ICD-10-CM | POA: Diagnosis not present

## 2020-04-26 DIAGNOSIS — I480 Paroxysmal atrial fibrillation: Secondary | ICD-10-CM | POA: Diagnosis not present

## 2020-05-05 DIAGNOSIS — N3946 Mixed incontinence: Secondary | ICD-10-CM | POA: Diagnosis not present

## 2020-05-05 DIAGNOSIS — Z5181 Encounter for therapeutic drug level monitoring: Secondary | ICD-10-CM | POA: Diagnosis not present

## 2020-05-05 DIAGNOSIS — Z794 Long term (current) use of insulin: Secondary | ICD-10-CM | POA: Diagnosis not present

## 2020-05-05 DIAGNOSIS — I129 Hypertensive chronic kidney disease with stage 1 through stage 4 chronic kidney disease, or unspecified chronic kidney disease: Secondary | ICD-10-CM | POA: Diagnosis not present

## 2020-05-05 DIAGNOSIS — E1142 Type 2 diabetes mellitus with diabetic polyneuropathy: Secondary | ICD-10-CM | POA: Diagnosis not present

## 2020-05-05 DIAGNOSIS — L89152 Pressure ulcer of sacral region, stage 2: Secondary | ICD-10-CM | POA: Diagnosis not present

## 2020-05-05 DIAGNOSIS — R339 Retention of urine, unspecified: Secondary | ICD-10-CM | POA: Diagnosis not present

## 2020-05-05 DIAGNOSIS — I480 Paroxysmal atrial fibrillation: Secondary | ICD-10-CM | POA: Diagnosis not present

## 2020-05-05 DIAGNOSIS — Z7901 Long term (current) use of anticoagulants: Secondary | ICD-10-CM | POA: Diagnosis not present

## 2020-05-05 DIAGNOSIS — Z466 Encounter for fitting and adjustment of urinary device: Secondary | ICD-10-CM | POA: Diagnosis not present

## 2020-05-05 DIAGNOSIS — E1122 Type 2 diabetes mellitus with diabetic chronic kidney disease: Secondary | ICD-10-CM | POA: Diagnosis not present

## 2020-05-06 DIAGNOSIS — I129 Hypertensive chronic kidney disease with stage 1 through stage 4 chronic kidney disease, or unspecified chronic kidney disease: Secondary | ICD-10-CM | POA: Diagnosis not present

## 2020-05-06 DIAGNOSIS — N2581 Secondary hyperparathyroidism of renal origin: Secondary | ICD-10-CM | POA: Diagnosis not present

## 2020-05-06 DIAGNOSIS — N184 Chronic kidney disease, stage 4 (severe): Secondary | ICD-10-CM | POA: Diagnosis not present

## 2020-05-06 DIAGNOSIS — D631 Anemia in chronic kidney disease: Secondary | ICD-10-CM | POA: Diagnosis not present

## 2020-05-17 DIAGNOSIS — E1122 Type 2 diabetes mellitus with diabetic chronic kidney disease: Secondary | ICD-10-CM | POA: Diagnosis not present

## 2020-05-17 DIAGNOSIS — R339 Retention of urine, unspecified: Secondary | ICD-10-CM | POA: Diagnosis not present

## 2020-05-17 DIAGNOSIS — L89152 Pressure ulcer of sacral region, stage 2: Secondary | ICD-10-CM | POA: Diagnosis not present

## 2020-05-17 DIAGNOSIS — Z466 Encounter for fitting and adjustment of urinary device: Secondary | ICD-10-CM | POA: Diagnosis not present

## 2020-05-17 DIAGNOSIS — I480 Paroxysmal atrial fibrillation: Secondary | ICD-10-CM | POA: Diagnosis not present

## 2020-05-17 DIAGNOSIS — N3946 Mixed incontinence: Secondary | ICD-10-CM | POA: Diagnosis not present

## 2020-05-18 DIAGNOSIS — N3946 Mixed incontinence: Secondary | ICD-10-CM | POA: Diagnosis not present

## 2020-05-18 DIAGNOSIS — I480 Paroxysmal atrial fibrillation: Secondary | ICD-10-CM | POA: Diagnosis not present

## 2020-05-18 DIAGNOSIS — R609 Edema, unspecified: Secondary | ICD-10-CM | POA: Diagnosis not present

## 2020-05-18 DIAGNOSIS — D649 Anemia, unspecified: Secondary | ICD-10-CM | POA: Diagnosis not present

## 2020-05-18 DIAGNOSIS — K219 Gastro-esophageal reflux disease without esophagitis: Secondary | ICD-10-CM | POA: Diagnosis not present

## 2020-05-18 DIAGNOSIS — E039 Hypothyroidism, unspecified: Secondary | ICD-10-CM | POA: Diagnosis not present

## 2020-05-18 DIAGNOSIS — H9193 Unspecified hearing loss, bilateral: Secondary | ICD-10-CM | POA: Diagnosis not present

## 2020-05-18 DIAGNOSIS — E1140 Type 2 diabetes mellitus with diabetic neuropathy, unspecified: Secondary | ICD-10-CM | POA: Diagnosis not present

## 2020-05-18 DIAGNOSIS — R54 Age-related physical debility: Secondary | ICD-10-CM | POA: Diagnosis not present

## 2020-05-18 DIAGNOSIS — N184 Chronic kidney disease, stage 4 (severe): Secondary | ICD-10-CM | POA: Diagnosis not present

## 2020-05-18 DIAGNOSIS — E1142 Type 2 diabetes mellitus with diabetic polyneuropathy: Secondary | ICD-10-CM | POA: Diagnosis not present

## 2020-05-18 DIAGNOSIS — N183 Chronic kidney disease, stage 3 unspecified: Secondary | ICD-10-CM | POA: Diagnosis not present

## 2020-05-18 DIAGNOSIS — E1169 Type 2 diabetes mellitus with other specified complication: Secondary | ICD-10-CM | POA: Diagnosis not present

## 2020-05-18 DIAGNOSIS — Z7901 Long term (current) use of anticoagulants: Secondary | ICD-10-CM | POA: Diagnosis not present

## 2020-05-18 DIAGNOSIS — I10 Essential (primary) hypertension: Secondary | ICD-10-CM | POA: Diagnosis not present

## 2020-05-18 DIAGNOSIS — I129 Hypertensive chronic kidney disease with stage 1 through stage 4 chronic kidney disease, or unspecified chronic kidney disease: Secondary | ICD-10-CM | POA: Diagnosis not present

## 2020-05-18 DIAGNOSIS — E1122 Type 2 diabetes mellitus with diabetic chronic kidney disease: Secondary | ICD-10-CM | POA: Diagnosis not present

## 2020-05-18 DIAGNOSIS — E7800 Pure hypercholesterolemia, unspecified: Secondary | ICD-10-CM | POA: Diagnosis not present

## 2020-05-18 DIAGNOSIS — D509 Iron deficiency anemia, unspecified: Secondary | ICD-10-CM | POA: Diagnosis not present

## 2020-05-19 DIAGNOSIS — Z466 Encounter for fitting and adjustment of urinary device: Secondary | ICD-10-CM | POA: Diagnosis not present

## 2020-05-19 DIAGNOSIS — L89152 Pressure ulcer of sacral region, stage 2: Secondary | ICD-10-CM | POA: Diagnosis not present

## 2020-05-19 DIAGNOSIS — I480 Paroxysmal atrial fibrillation: Secondary | ICD-10-CM | POA: Diagnosis not present

## 2020-05-19 DIAGNOSIS — R799 Abnormal finding of blood chemistry, unspecified: Secondary | ICD-10-CM | POA: Diagnosis not present

## 2020-05-19 DIAGNOSIS — E1122 Type 2 diabetes mellitus with diabetic chronic kidney disease: Secondary | ICD-10-CM | POA: Diagnosis not present

## 2020-05-19 DIAGNOSIS — R339 Retention of urine, unspecified: Secondary | ICD-10-CM | POA: Diagnosis not present

## 2020-05-19 DIAGNOSIS — N3946 Mixed incontinence: Secondary | ICD-10-CM | POA: Diagnosis not present

## 2020-05-20 ENCOUNTER — Telehealth: Payer: Self-pay | Admitting: Internal Medicine

## 2020-05-20 DIAGNOSIS — D649 Anemia, unspecified: Secondary | ICD-10-CM | POA: Diagnosis not present

## 2020-05-20 NOTE — Telephone Encounter (Signed)
Pt c/o medication issue:  1. Name of Medication: warfarin (COUMADIN) 2 MG tablet  2. How are you currently taking this medication (dosage and times per day)? 2 tablet daily   3. Are you having a reaction (difficulty breathing--STAT)? No  4. What is your medication issue? Patient's daughter called to say patient had blood work done on 3/22 her hemoglobin was low so they took stool sample and there was blood in stool. Primary Dr. Geraldine Solar patient to see a GI dr because think it may be internal bleeding. Primary Dr. Is requesting that the patient stop stop taken their warfarin (COUMADIN) 2 MG tablet until she is able to see GI dr.

## 2020-05-20 NOTE — Telephone Encounter (Signed)
Holding medication would be a Physician decision. Please forward to Provider. Also, the pt is not followed by our Anticoagulation Clinics.

## 2020-05-20 NOTE — Telephone Encounter (Signed)
RN spoke with Kalman Shan (daughter) to clarify. Rose states that she was calling us to inform us that the PCP wanted to let Dr. Rayann Heman know that they were going to hold the coumadin until the patient is seen by GI due to blood in her stool and decreased hemoglobin. RN will route to Dr. Rayann Heman for FYI.

## 2020-05-22 IMAGING — CT CT CERVICAL SPINE W/O CM
5 of 8 series · 14 of 33 positions shown, 15 images · non-contrast
Comparison: None.

CLINICAL DATA: Cervical spine trauma.

EXAM:
CT HEAD WITHOUT CONTRAST
CT CERVICAL SPINE WITHOUT CONTRAST
TECHNIQUE: Multidetector CT imaging of the head and cervical spine was
performed following the standard protocol without intravenous
contrast. Multiplanar CT image reconstructions of the cervical spine
were also generated.

[Series 5: head bone · axial · 0.39mm/px · z∈[-78,-32]mm · 2 of 69 slices shown]
[im 23/69  bone]
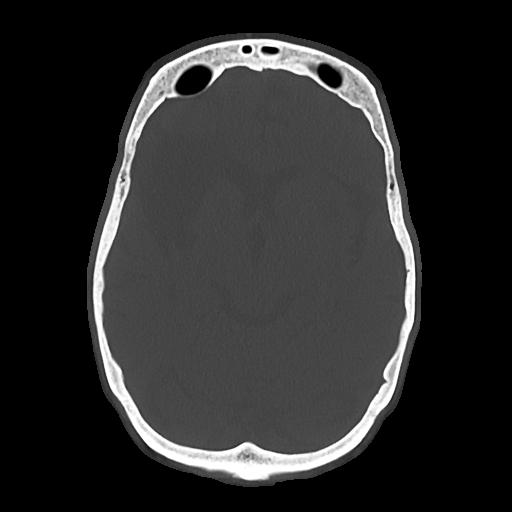
[im 46/69  bone]
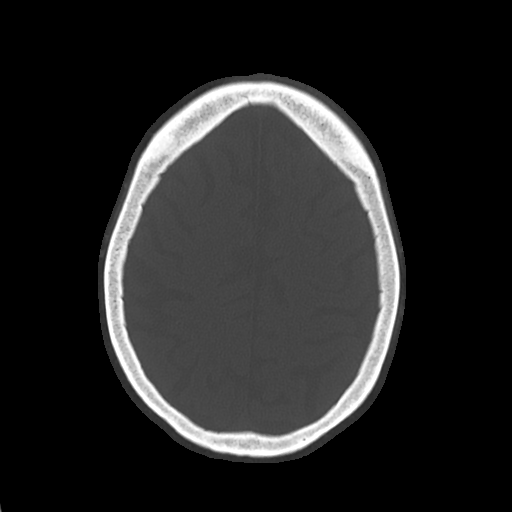

[Series 6: cor soft · coronal · 0.27mm/px · 3 of 67 slices shown]
[im 17/67  bone]
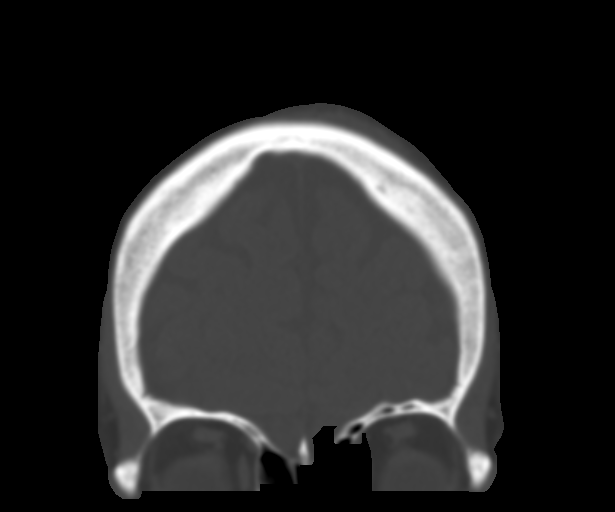
[im 34/67  bone]
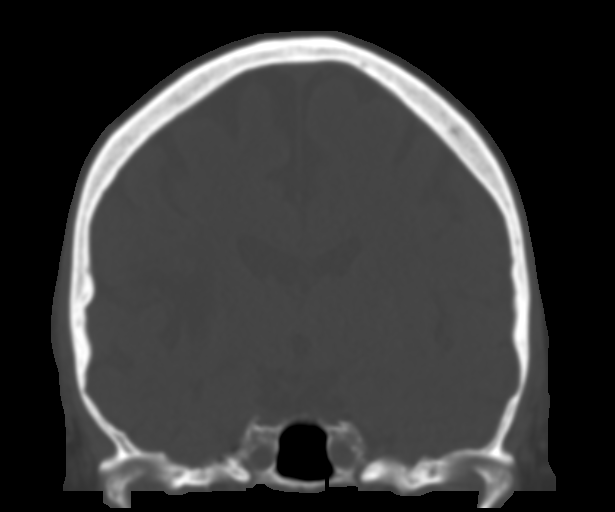
[im 50/67  bone]
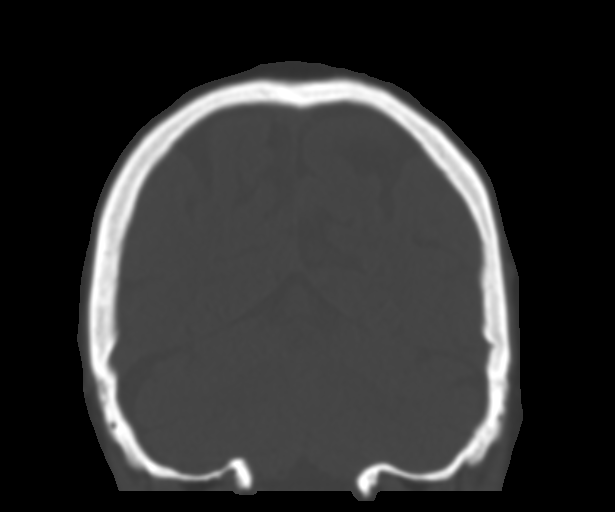

[Series 9: c spine soft · axial · 0.36mm/px · z∈[-214,-162]mm · 2 of 80 slices shown]
[im 27/80  soft-tissue]
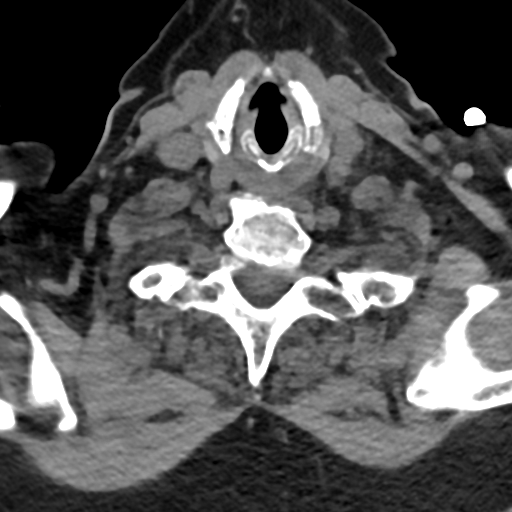
[im 53/80  soft-tissue]
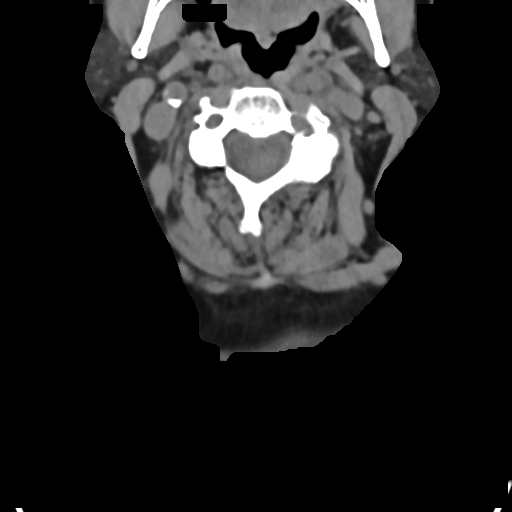

[Series 10: sag bone · sagittal · 0.30mm/px · 4 of 61 slices shown]
[im 13/61  bone]
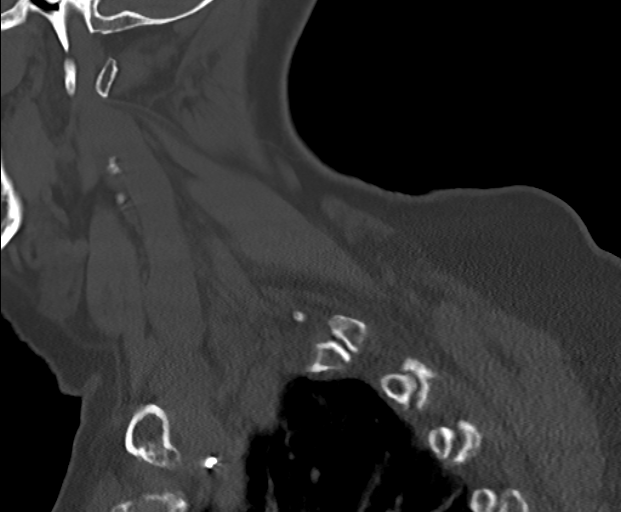
[im 25/61  bone]
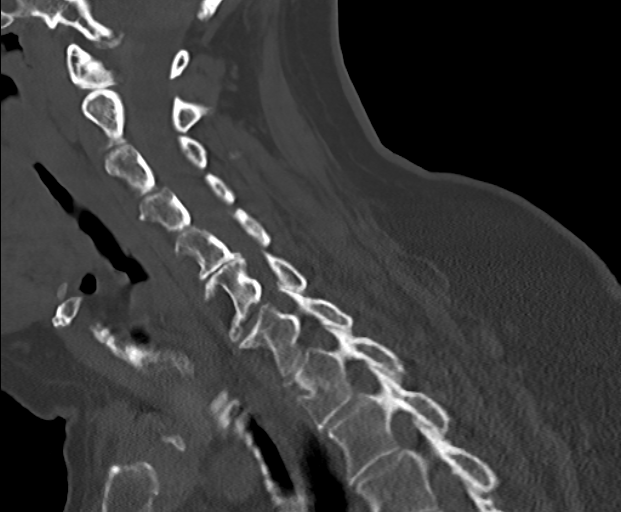
[im 37/61  bone]
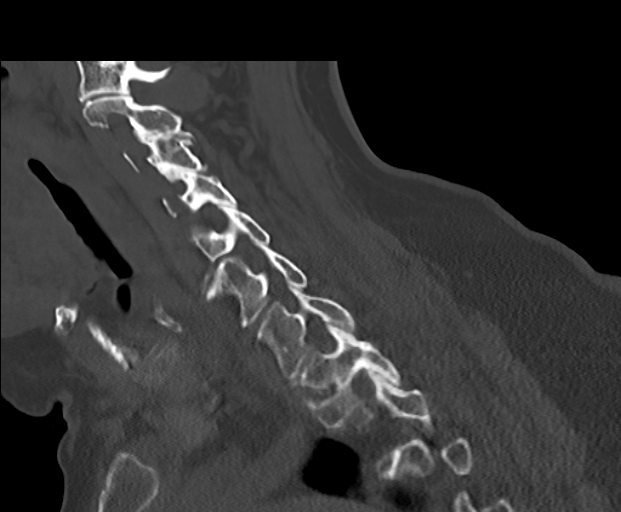
[im 49/61  bone]
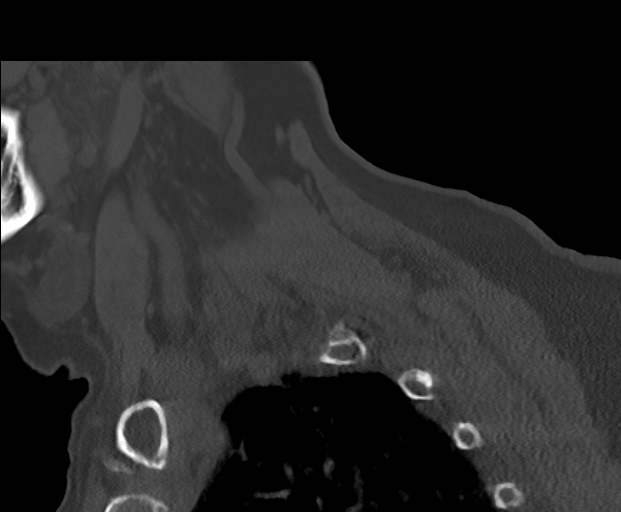

[Series 12: orthogonal axials · axial · 0.21mm/px · z∈[-242,-168]mm · 3 of 99 slices shown, 4 images]
[im 25/99  soft-tissue]
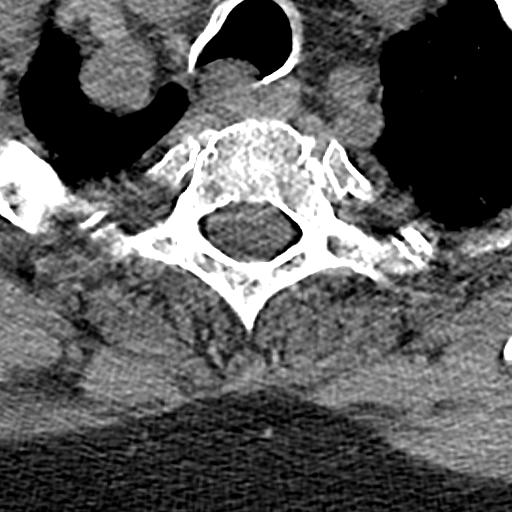
[im 25/99  bone]
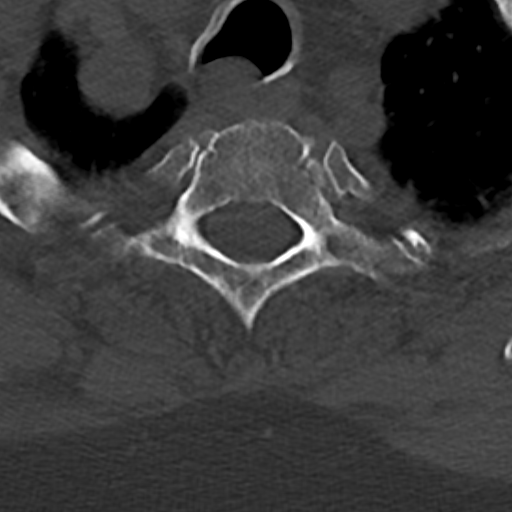
[im 50/99  bone]
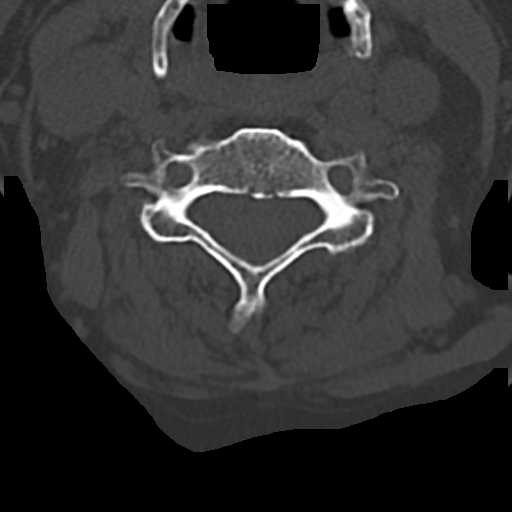
[im 74/99  bone]
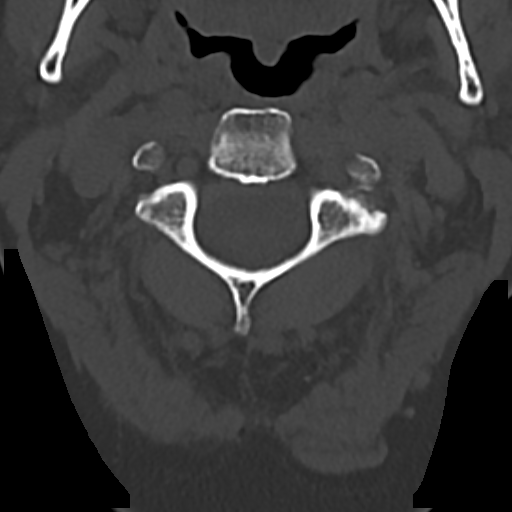

[14 of 33 positions shown; findings below may reference images not displayed]

FINDINGS: CT HEAD FINDINGS

Brain: Mild diffuse cortical atrophy is noted. Right frontal
encephalomalacia is noted consistent with old infarction. Mild
chronic ischemic white matter disease is noted. No mass effect or
midline shift is noted. Ventricular size is within normal limits.
There is no evidence of mass lesion, hemorrhage or acute infarction.

Vascular: No hyperdense vessel or unexpected calcification.

Skull: Normal. Negative for fracture or focal lesion.

Sinuses/Orbits: No acute finding.

Other: None.

CT CERVICAL SPINE FINDINGS

Alignment: Normal.

Skull base and vertebrae: No acute fracture. No primary bone lesion
or focal pathologic process.

Soft tissues and spinal canal: No prevertebral fluid or swelling. No
visible canal hematoma.

Disc levels: Mild degenerative disc disease is noted at C5-6 with
anterior and posterior osteophyte formation.

Upper chest: Negative.

Other: Degenerative changes are seen involving posterior facet
joints bilaterally.
IMPRESSION: Mild diffuse cortical atrophy. Mild chronic ischemic white matter
disease. Old right frontal infarction. No acute intracranial
abnormality seen.

Mild degenerative disc disease is noted at C5-6. No acute
abnormality seen in the cervical spine.

## 2020-05-24 DIAGNOSIS — E1122 Type 2 diabetes mellitus with diabetic chronic kidney disease: Secondary | ICD-10-CM | POA: Diagnosis not present

## 2020-05-24 DIAGNOSIS — N3946 Mixed incontinence: Secondary | ICD-10-CM | POA: Diagnosis not present

## 2020-05-24 DIAGNOSIS — Z466 Encounter for fitting and adjustment of urinary device: Secondary | ICD-10-CM | POA: Diagnosis not present

## 2020-05-24 DIAGNOSIS — L89152 Pressure ulcer of sacral region, stage 2: Secondary | ICD-10-CM | POA: Diagnosis not present

## 2020-05-24 DIAGNOSIS — R339 Retention of urine, unspecified: Secondary | ICD-10-CM | POA: Diagnosis not present

## 2020-05-24 DIAGNOSIS — I480 Paroxysmal atrial fibrillation: Secondary | ICD-10-CM | POA: Diagnosis not present

## 2020-05-25 ENCOUNTER — Encounter (HOSPITAL_COMMUNITY): Payer: Self-pay

## 2020-05-25 ENCOUNTER — Inpatient Hospital Stay (HOSPITAL_COMMUNITY)
Admission: EM | Admit: 2020-05-25 | Discharge: 2020-05-27 | DRG: 683 | Disposition: A | Discharge status: Home Health | Payer: Medicare Other | Source: Ambulatory Visit | Attending: Family Medicine | Admitting: Family Medicine

## 2020-05-25 ENCOUNTER — Other Ambulatory Visit: Payer: Self-pay

## 2020-05-25 DIAGNOSIS — Z7901 Long term (current) use of anticoagulants: Secondary | ICD-10-CM

## 2020-05-25 DIAGNOSIS — K222 Esophageal obstruction: Secondary | ICD-10-CM | POA: Diagnosis not present

## 2020-05-25 DIAGNOSIS — Z950 Presence of cardiac pacemaker: Secondary | ICD-10-CM | POA: Diagnosis not present

## 2020-05-25 DIAGNOSIS — K5900 Constipation, unspecified: Secondary | ICD-10-CM | POA: Diagnosis present

## 2020-05-25 DIAGNOSIS — Z9049 Acquired absence of other specified parts of digestive tract: Secondary | ICD-10-CM

## 2020-05-25 DIAGNOSIS — D649 Anemia, unspecified: Secondary | ICD-10-CM

## 2020-05-25 DIAGNOSIS — Z7984 Long term (current) use of oral hypoglycemic drugs: Secondary | ICD-10-CM

## 2020-05-25 DIAGNOSIS — N189 Chronic kidney disease, unspecified: Secondary | ICD-10-CM | POA: Diagnosis not present

## 2020-05-25 DIAGNOSIS — E119 Type 2 diabetes mellitus without complications: Secondary | ICD-10-CM

## 2020-05-25 DIAGNOSIS — I10 Essential (primary) hypertension: Secondary | ICD-10-CM | POA: Diagnosis not present

## 2020-05-25 DIAGNOSIS — K529 Noninfective gastroenteritis and colitis, unspecified: Secondary | ICD-10-CM | POA: Diagnosis not present

## 2020-05-25 DIAGNOSIS — Z831 Family history of other infectious and parasitic diseases: Secondary | ICD-10-CM | POA: Diagnosis not present

## 2020-05-25 DIAGNOSIS — K2970 Gastritis, unspecified, without bleeding: Secondary | ICD-10-CM | POA: Diagnosis present

## 2020-05-25 DIAGNOSIS — D631 Anemia in chronic kidney disease: Secondary | ICD-10-CM | POA: Diagnosis not present

## 2020-05-25 DIAGNOSIS — I129 Hypertensive chronic kidney disease with stage 1 through stage 4 chronic kidney disease, or unspecified chronic kidney disease: Principal | ICD-10-CM | POA: Diagnosis present

## 2020-05-25 DIAGNOSIS — E1122 Type 2 diabetes mellitus with diabetic chronic kidney disease: Secondary | ICD-10-CM | POA: Diagnosis not present

## 2020-05-25 DIAGNOSIS — K449 Diaphragmatic hernia without obstruction or gangrene: Secondary | ICD-10-CM | POA: Diagnosis not present

## 2020-05-25 DIAGNOSIS — E785 Hyperlipidemia, unspecified: Secondary | ICD-10-CM | POA: Diagnosis present

## 2020-05-25 DIAGNOSIS — K2971 Gastritis, unspecified, with bleeding: Secondary | ICD-10-CM | POA: Diagnosis not present

## 2020-05-25 DIAGNOSIS — Z792 Long term (current) use of antibiotics: Secondary | ICD-10-CM

## 2020-05-25 DIAGNOSIS — K219 Gastro-esophageal reflux disease without esophagitis: Secondary | ICD-10-CM | POA: Diagnosis present

## 2020-05-25 DIAGNOSIS — D62 Acute posthemorrhagic anemia: Secondary | ICD-10-CM | POA: Diagnosis not present

## 2020-05-25 DIAGNOSIS — K3189 Other diseases of stomach and duodenum: Secondary | ICD-10-CM | POA: Diagnosis not present

## 2020-05-25 DIAGNOSIS — E039 Hypothyroidism, unspecified: Secondary | ICD-10-CM | POA: Diagnosis not present

## 2020-05-25 DIAGNOSIS — Z66 Do not resuscitate: Secondary | ICD-10-CM | POA: Diagnosis present

## 2020-05-25 DIAGNOSIS — Z833 Family history of diabetes mellitus: Secondary | ICD-10-CM | POA: Diagnosis not present

## 2020-05-25 DIAGNOSIS — D509 Iron deficiency anemia, unspecified: Secondary | ICD-10-CM | POA: Diagnosis not present

## 2020-05-25 DIAGNOSIS — Z8719 Personal history of other diseases of the digestive system: Secondary | ICD-10-CM | POA: Diagnosis not present

## 2020-05-25 DIAGNOSIS — I480 Paroxysmal atrial fibrillation: Secondary | ICD-10-CM | POA: Diagnosis not present

## 2020-05-25 DIAGNOSIS — Z8673 Personal history of transient ischemic attack (TIA), and cerebral infarction without residual deficits: Secondary | ICD-10-CM

## 2020-05-25 DIAGNOSIS — I4891 Unspecified atrial fibrillation: Secondary | ICD-10-CM | POA: Diagnosis present

## 2020-05-25 DIAGNOSIS — I495 Sick sinus syndrome: Secondary | ICD-10-CM | POA: Diagnosis present

## 2020-05-25 DIAGNOSIS — Z888 Allergy status to other drugs, medicaments and biological substances status: Secondary | ICD-10-CM

## 2020-05-25 DIAGNOSIS — Z20822 Contact with and (suspected) exposure to covid-19: Secondary | ICD-10-CM | POA: Diagnosis not present

## 2020-05-25 DIAGNOSIS — R195 Other fecal abnormalities: Secondary | ICD-10-CM | POA: Diagnosis not present

## 2020-05-25 DIAGNOSIS — K922 Gastrointestinal hemorrhage, unspecified: Secondary | ICD-10-CM | POA: Diagnosis present

## 2020-05-25 DIAGNOSIS — N184 Chronic kidney disease, stage 4 (severe): Secondary | ICD-10-CM | POA: Diagnosis not present

## 2020-05-25 DIAGNOSIS — Z79890 Hormone replacement therapy (postmenopausal): Secondary | ICD-10-CM

## 2020-05-25 DIAGNOSIS — Z79899 Other long term (current) drug therapy: Secondary | ICD-10-CM

## 2020-05-25 HISTORY — DX: Chronic kidney disease, unspecified: N18.9

## 2020-05-25 HISTORY — DX: Presence of cardiac pacemaker: Z95.0

## 2020-05-25 LAB — CBC
HCT: 28.4 % — ABNORMAL LOW (ref 36.0–46.0)
Hemoglobin: 8.8 g/dL — ABNORMAL LOW (ref 12.0–15.0)
MCH: 27.6 pg (ref 26.0–34.0)
MCHC: 31 g/dL (ref 30.0–36.0)
MCV: 89 fL (ref 80.0–100.0)
Platelets: 192 10*3/uL (ref 150–400)
RBC: 3.19 MIL/uL — ABNORMAL LOW (ref 3.87–5.11)
RDW: 17.4 % — ABNORMAL HIGH (ref 11.5–15.5)
WBC: 5.9 10*3/uL (ref 4.0–10.5)
nRBC: 0 % (ref 0.0–0.2)

## 2020-05-25 LAB — COMPREHENSIVE METABOLIC PANEL
ALT: 23 U/L (ref 0–44)
AST: 23 U/L (ref 15–41)
Albumin: 3.5 g/dL (ref 3.5–5.0)
Alkaline Phosphatase: 56 U/L (ref 38–126)
Anion gap: 8 (ref 5–15)
BUN: 45 mg/dL — ABNORMAL HIGH (ref 8–23)
CO2: 24 mmol/L (ref 22–32)
Calcium: 9.2 mg/dL (ref 8.9–10.3)
Chloride: 108 mmol/L (ref 98–111)
Creatinine, Ser: 1.89 mg/dL — ABNORMAL HIGH (ref 0.44–1.00)
GFR, Estimated: 26 mL/min — ABNORMAL LOW (ref >60)
Glucose, Bld: 164 mg/dL — ABNORMAL HIGH (ref 70–99)
Potassium: 4.3 mmol/L (ref 3.5–5.1)
Sodium: 140 mmol/L (ref 135–145)
Total Bilirubin: 0.6 mg/dL (ref 0.3–1.2)
Total Protein: 7.6 g/dL (ref 6.5–8.1)

## 2020-05-25 LAB — RESP PANEL BY RT-PCR (FLU A&B, COVID) ARPGX2
Influenza A by PCR: NEGATIVE
Influenza B by PCR: NEGATIVE
SARS Coronavirus 2 by RT PCR: NEGATIVE

## 2020-05-25 LAB — HEMOGLOBIN AND HEMATOCRIT, BLOOD
HCT: 25.5 % — ABNORMAL LOW (ref 36.0–46.0)
Hemoglobin: 7.9 g/dL — ABNORMAL LOW (ref 12.0–15.0)

## 2020-05-25 LAB — PROTIME-INR
INR: 1.1 (ref 0.8–1.2)
Prothrombin Time: 14.1 seconds (ref 11.4–15.2)

## 2020-05-25 LAB — TYPE AND SCREEN
ABO/RH(D): O POS
Antibody Screen: POSITIVE

## 2020-05-25 MED ORDER — PANTOPRAZOLE SODIUM 40 MG IV SOLR
40.0000 mg | Freq: Two times a day (BID) | INTRAVENOUS | Status: DC
  Administered 2020-05-25 – 2020-05-27 (×4): 40 mg via INTRAVENOUS
  Filled 2020-05-25 (×4): qty 40

## 2020-05-25 MED ORDER — DILTIAZEM HCL ER COATED BEADS 120 MG PO CP24
120.0000 mg | ORAL_CAPSULE | Freq: Every day | ORAL | Status: DC
  Administered 2020-05-27: 120 mg via ORAL
  Filled 2020-05-25: qty 1

## 2020-05-25 MED ORDER — SODIUM CHLORIDE 0.9 % IV SOLN: INTRAVENOUS | Status: DC

## 2020-05-25 MED ORDER — ROSUVASTATIN CALCIUM 10 MG PO TABS
10.0000 mg | ORAL_TABLET | Freq: Every day | ORAL | Status: DC
  Administered 2020-05-27: 10 mg via ORAL
  Filled 2020-05-25: qty 1

## 2020-05-25 MED ORDER — FUROSEMIDE 20 MG PO TABS
20.0000 mg | ORAL_TABLET | Freq: Every day | ORAL | Status: DC
  Administered 2020-05-27: 20 mg via ORAL
  Filled 2020-05-25: qty 1

## 2020-05-25 MED ORDER — LEVOTHYROXINE SODIUM 50 MCG PO TABS
50.0000 ug | ORAL_TABLET | Freq: Every day | ORAL | Status: DC
  Administered 2020-05-27: 50 ug via ORAL
  Filled 2020-05-25 (×2): qty 1

## 2020-05-25 MED ORDER — SODIUM CHLORIDE 0.9% FLUSH
3.0000 mL | Freq: Two times a day (BID) | INTRAVENOUS | Status: DC
  Administered 2020-05-25 – 2020-05-27 (×3): 3 mL via INTRAVENOUS

## 2020-05-25 NOTE — ED Notes (Signed)
Secure chat message sent to [place], [place] regarding admission

## 2020-05-25 NOTE — ED Triage Notes (Addendum)
Patient reports that she saw the GI doctor today and was told she "had bleeding inside."  Patient states she has not seen any visible bleeding. Patient was taking Coumadin, but states she was told to hold off on taking 3-4 days ago.

## 2020-05-25 NOTE — ED Provider Notes (Signed)
[facility] DEPT Provider Note   CSN: [id number] Arrival date & time: 05/25/20  1244     History Chief Complaint  Patient presents with  . GI Bleeding    Tiffany [name] is a 87 y.o. female.  87 yo F with a cc of dark stools.  Found to be guaic + and sent here for eval.  Patient   The history is provided by the patient.  Illness Severity:  Moderate Onset quality:  Gradual Duration:  2 days Timing:  Constant Progression:  Worsening Chronicity:  New Associated symptoms: no chest pain, no congestion, no fever, no headaches, no myalgias, no nausea, no rhinorrhea, no shortness of breath, no vomiting and no wheezing        Past Medical History:  Diagnosis Date  . Diverticulosis   . DM2 (diabetes mellitus, type 2) ([place])   . Esophageal reflux   . HTN (hypertension)   . Hyperlipidemia   . Hypothyroidism   . Microalbuminuria    last done in 1/07   . Paroxysmal atrial fibrillation (HCC)    and atrial tachycardia  . Stroke ([place]) 1999  . Tachycardia-bradycardia syndrome ([place])    s/p PPM  . Unspecified hypothyroidism 11/20/2013    Patient Active Problem List   Diagnosis Date Noted  . Acute renal failure (ARF) ([place]) 10/30/2016  . Acute renal failure syndrome ([place])   . Dehydration   . ARF (acute renal failure) ([place]) 11/19/2014  . Acute gastroenteritis 11/19/2014  . UTI (urinary tract infection) 11/22/2013  . Hypothyroidism 11/20/2013  . Acute blood loss anemia 11/20/2013  . Hematochezia 11/18/2013  . Warfarin-induced coagulopathy ([place]) 11/18/2013  . Pacemaker-Medtronic 01/04/2012  . Long term (current) use of anticoagulants 10/25/2011  . Tachycardia-bradycardia syndrome ([place]) 01/04/2011  . Atrial fibrillation ([place]) 01/05/2010  . Hyperlipidemia 07/28/2006  . Essential hypertension 07/28/2006  . CARDIOMYOPATHY 07/28/2006  . DM type 2 (diabetes mellitus, type 2) ([place]) 05/16/2006  . C V A/STROKE 05/16/2006  . HEMORRHOIDS, WITH  BLEEDING 05/16/2006    Past Surgical History:  Procedure Laterality Date  . CHOLECYSTECTOMY    . COLONOSCOPY N/A 11/21/2013   Procedure: COLONOSCOPY;  Surgeon: Jeryl Columbia, MD;  Location: [facility] ENDOSCOPY;  Service: Endoscopy;  Laterality: N/A;  . HEMORRHOID SURGERY    . hysterectomy (other)    . PACEMAKER GENERATOR CHANGE  10/03/12   DMT Advisa DR gen change by Dr Rayann Heman  . PACEMAKER GENERATOR CHANGE N/A 10/03/2012   Procedure: PACEMAKER GENERATOR CHANGE;  Surgeon: Thompson Grayer, MD;  Location: [facility] CATH LAB;  Service: Cardiovascular;  Laterality: N/A;  . permanent pacemaker  1999   updated 2006 (MDT) by Dr Verlon Setting; gen change 09-2012 by Dr Rayann Heman (MDT)  . PPM GENERATOR CHANGEOUT N/A 12/11/2019   Procedure: PPM GENERATOR CHANGEOUT;  Surgeon: Thompson Grayer, MD;  Location: [place] CV LAB;  Service: Cardiovascular;  Laterality: N/A;     OB History   No obstetric history on file.     Family History  Problem Relation Age of Onset  . Tuberculosis Mother   . Diabetes Father     Social History   Tobacco Use  . Smoking status: Never Smoker  . Smokeless tobacco: Never Used  Vaping Use  . Vaping Use: Never used  Substance Use Topics  . Alcohol use: No  . Drug use: No    Home Medications Prior to Admission medications   Medication Sig Start Date End Date Taking? Authorizing Provider  cephALEXin (KEFLEX) 500 MG capsule  Take 1 capsule (500 mg total) by mouth 4 (four) times daily. 12/23/19   Vickie Epley, MD  diltiazem (CARDIZEM CD) 180 MG 24 hr capsule Take 180 mg by mouth daily.  07/17/18   [provider]  FERGON 240 (27 FE) MG tablet Take 240 mg by mouth every evening.  10/31/14   [provider]  furosemide (LASIX) 20 MG tablet Take 20 mg by mouth daily.    [provider]  glipiZIDE (GLUCOTROL) 5 MG tablet Take 5 mg by mouth daily before breakfast.     [provider]  levothyroxine (SYNTHROID, LEVOTHROID) 50 MCG tablet Take 50 mcg by mouth  daily before breakfast.     [provider]  Multiple Vitamins-Minerals (MULTIVITAMIN WITH MINERALS) tablet Take 1 tablet by mouth daily.    [provider]  potassium chloride SA (K-DUR,KLOR-CON) 20 MEQ tablet Take 20 mEq by mouth daily.    [provider]  rosuvastatin (CRESTOR) 10 MG tablet Take 10 mg by mouth daily.    [provider]  Semaglutide,0.25 or 0.5MG /DOS, 2 MG/1.5ML SOPN Inject 0.25 mg into the skin once a week.    [provider]  warfarin (COUMADIN) 2 MG tablet Take 2 mg by mouth daily at 4 PM.     [provider]    Allergies    Patient has no known allergies.  Review of Systems   Review of Systems  Constitutional: Negative for chills and fever.  HENT: Negative for congestion and rhinorrhea.   Eyes: Negative for redness and visual disturbance.  Respiratory: Negative for shortness of breath and wheezing.   Cardiovascular: Negative for chest pain and palpitations.  Gastrointestinal: Positive for blood in stool. Negative for nausea and vomiting.  Genitourinary: Negative for dysuria and urgency.  Musculoskeletal: Negative for arthralgias and myalgias.  Skin: Negative for pallor and wound.  Neurological: Negative for dizziness and headaches.    Physical Exam Updated Vital Signs BP (!) 125/44   Pulse 60   Temp 97.6 F (36.4 C) (Oral)   Resp 20   Ht 5' 2.5" (1.588 m)   Wt 69.4 kg   SpO2 99%   BMI 27.54 kg/m   Physical Exam Vitals and nursing note reviewed.  Constitutional:      General: She is not in acute distress.    Appearance: She is well-developed. She is not diaphoretic.  HENT:     Head: Normocephalic and atraumatic.  Eyes:     Pupils: Pupils are equal, round, and reactive to light.  Cardiovascular:     Rate and Rhythm: Normal rate and regular rhythm.     Heart sounds: No murmur heard. No friction rub. No gallop.   Pulmonary:     Effort: Pulmonary effort is normal.     Breath sounds: No  wheezing or rales.  Abdominal:     General: There is no distension.     Palpations: Abdomen is soft.     Tenderness: There is no abdominal tenderness.  Musculoskeletal:        General: No tenderness.     Cervical back: Normal range of motion and neck supple.  Skin:    General: Skin is warm and dry.  Neurological:     Mental Status: She is alert and oriented to person, place, and time.  Psychiatric:        Behavior: Behavior normal.     ED Results / Procedures / Treatments   Labs (all labs ordered are listed, but only  abnormal results are displayed) Labs Reviewed  COMPREHENSIVE METABOLIC PANEL - Abnormal; Notable for the following components:      Result Value   Glucose, Bld 164 (*)    BUN 45 (*)    Creatinine, Ser 1.89 (*)    GFR, Estimated 26 (*)    All other components within normal limits  CBC - Abnormal; Notable for the following components:   RBC 3.19 (*)    Hemoglobin 8.8 (*)    HCT 28.4 (*)    RDW 17.4 (*)    All other components within normal limits  RESP PANEL BY RT-PCR (FLU A&B, COVID) ARPGX2  PROTIME-INR  POC OCCULT BLOOD, ED  TYPE AND SCREEN    EKG None  Radiology No results found.  Procedures Procedures   Medications Ordered in ED Medications  pantoprazole (PROTONIX) injection 40 mg (has no administration in time range)    ED Course  I have reviewed the triage vital signs and the nursing notes.  Pertinent labs & imaging results that were available during my care of the patient were reviewed by me and considered in my medical decision making (see chart for details).    MDM Rules/Calculators/A&P                          87 yo F with a dark stools.  Hemoglobin 8.8 slightly down from 9.2 checked earlier in the week.  BUN is elevated from baseline as well at 45.  Consistent with upper GI bleed.  I did discuss the case with Dr. Acie Fredrickson GI recommended Protonix twice daily and discussing with the hospitalist for admission.  Likely endoscopy  tomorrow.  The patients results and plan were reviewed and discussed.   Any x-rays performed were independently reviewed by myself.   Differential diagnosis were considered with the presenting HPI.  Medications  pantoprazole (PROTONIX) injection 40 mg (has no administration in time range)    Vitals:   05/25/20 1255 05/25/20 1346 05/25/20 1356 05/25/20 1400  BP:  (!) 144/59  (!) 125/44  Pulse:  61  60  Resp:  20  20  Temp:      TempSrc:      SpO2:  100% 100% 99%  Weight: 69.4 kg     Height:        Final diagnoses:  Upper GI bleed    Admission/ observation were discussed with the admitting physician, patient and/or family and they are comfortable with the plan.   Final Clinical Impression(s) / ED Diagnoses Final diagnoses:  Upper GI bleed    Rx / DC Orders ED Discharge Orders    None       Deno Etienne, DO 05/25/20 1430

## 2020-05-25 NOTE — H&P (Addendum)
History and Physical        Hospital Admission Note Date: 05/25/2020  Patient name: Tiffany [name] [facility] Medical record number: [id number] Date of birth: September 27, 1931 Age: 87 y.o. Gender: female  PCP: Antony Contras, MD   Chief Complaint    Chief Complaint  Patient presents with  . GI Bleeding      HPI:   This is an 87 year old female with past medical history of type 2 diabetes, diverticulosis, GERD, CKD 4, hypertension, hyperlipidemia, paroxysmal atrial fibrillation on Coumadin, tachybradycardia syndrome s/p PPM, hypothyroidism who presented to the ED from the [place] office today for suspected GI bleeding.  Her PCP held her Coumadin on 3/22 due to concerns for anemia and Hemoccult positive stool from 3/22 until seen by her GI doctor. She has not noticed any GI bleeding, no abdominal pain, nausea, vomiting, diarrhea or constipation. She has had decreased energy though.    ED Course: Afebrile, hemodynamically stable, on room air. Notable Labs: BUN 45, creatinine 1.89, Hb 8.8, platelets 192, INR 1.1, COVID-19 pending otherwise labs at/near baseline. Patient received Protonix 40 mg IV.  [facility] was consulted by the ED provider.    Vitals:   05/25/20 1530 05/25/20 1600  BP: (!) 125/54 (!) 129/54  Pulse: 60 (!) 59  Resp: 20 20  Temp:    SpO2: 100% 100%     Review of Systems:  Review of Systems  All other systems reviewed and are negative.   Medical/Social/Family History   Past Medical History: Past Medical History:  Diagnosis Date  . Diverticulosis   . DM2 (diabetes mellitus, type 2) ([place])   . Esophageal reflux   . HTN (hypertension)   . Hyperlipidemia   . Hypothyroidism   . Microalbuminuria    last done in 1/07   . Paroxysmal atrial fibrillation (HCC)    and atrial tachycardia  . Stroke ([place]) 1999  . Tachycardia-bradycardia syndrome ([place])    s/p PPM  .  Unspecified hypothyroidism 11/20/2013    Past Surgical History:  Procedure Laterality Date  . CHOLECYSTECTOMY    . COLONOSCOPY N/A 11/21/2013   Procedure: COLONOSCOPY;  Surgeon: Jeryl Columbia, MD;  Location: [facility] ENDOSCOPY;  Service: Endoscopy;  Laterality: N/A;  . HEMORRHOID SURGERY    . hysterectomy (other)    . PACEMAKER GENERATOR CHANGE  10/03/12   DMT Advisa DR gen change by Dr Rayann Heman  . PACEMAKER GENERATOR CHANGE N/A 10/03/2012   Procedure: PACEMAKER GENERATOR CHANGE;  Surgeon: Thompson Grayer, MD;  Location: [facility] CATH LAB;  Service: Cardiovascular;  Laterality: N/A;  . permanent pacemaker  1999   updated 2006 (MDT) by Dr Verlon Setting; gen change 09-2012 by Dr Rayann Heman (MDT)  . PPM GENERATOR CHANGEOUT N/A 12/11/2019   Procedure: PPM GENERATOR CHANGEOUT;  Surgeon: Thompson Grayer, MD;  Location: [place] CV LAB;  Service: Cardiovascular;  Laterality: N/A;    Medications: Prior to Admission medications   Medication Sig Start Date End Date Taking? Authorizing Provider  diltiazem (CARDIZEM) 120 MG tablet Take 120 mg by mouth daily.   Yes [provider]  FERGON 240 (27 FE) MG tablet Take 240 mg by mouth in the morning and at bedtime. 10/31/14  Yes [provider]  furosemide (LASIX) 20 MG tablet Take 20 mg by mouth daily.   Yes [provider]  glipiZIDE (GLUCOTROL) 5 MG tablet Take 5 mg by mouth daily before breakfast.    Yes [provider]  levothyroxine (SYNTHROID, LEVOTHROID) 50 MCG tablet Take 50 mcg by mouth daily before breakfast.    Yes [provider]  Multiple Vitamins-Minerals (MULTIVITAMIN WITH MINERALS) tablet Take 1 tablet by mouth daily.   Yes [provider]  OZEMPIC, 1 MG/DOSE, 4 MG/3ML SOPN Inject 1 mg into the skin once a week. 04/29/20  Yes [provider]  potassium chloride SA (K-DUR,KLOR-CON) 20 MEQ tablet Take 20 mEq by mouth daily.   Yes [provider]  rosuvastatin (CRESTOR) 10 MG tablet Take 10 mg by mouth  daily.   Yes [provider]  warfarin (COUMADIN) 2 MG tablet Take 2 mg by mouth daily.   Yes [provider]  cephALEXin (KEFLEX) 500 MG capsule Take 1 capsule (500 mg total) by mouth 4 (four) times daily. Patient not taking: No sig reported 12/23/19   Vickie Epley, MD    Allergies:   Allergies  Allergen Reactions  . Other Other (See Comments)    Nuts, Corn, popcorn - diverticulitis  . Sitagliptin     Other reaction(s): Unknown    Social History:  reports that she has never smoked. She has never used smokeless tobacco. She reports that she does not drink alcohol and does not use drugs.  Family History: Family History  Problem Relation Age of Onset  . Tuberculosis Mother   . Diabetes Father      Objective   Physical Exam: Blood pressure (!) 129/54, pulse (!) 59, temperature 98 F (36.7 C), temperature source Oral, resp. rate 20, height 5' 2.5" (1.588 m), weight 69.4 kg, SpO2 100 %.  Physical Exam Vitals and nursing note reviewed.  Constitutional:      Appearance: Normal appearance.  HENT:     Head: Normocephalic and atraumatic.  Eyes:     Conjunctiva/sclera: Conjunctivae normal.  Cardiovascular:     Rate and Rhythm: Normal rate and regular rhythm.     Heart sounds: Murmur heard.   Systolic murmur is present with a grade of 3/6.   Pulmonary:     Effort: Pulmonary effort is normal.     Breath sounds: Normal breath sounds.  Abdominal:     General: Abdomen is flat.     Palpations: Abdomen is soft.  Musculoskeletal:        General: No swelling or tenderness.  Skin:    Coloration: Skin is pale. Skin is not jaundiced.  Neurological:     Mental Status: She is alert. Mental status is at baseline.  Psychiatric:        Mood and Affect: Mood normal.        Behavior: Behavior normal.     LABS on Admission: I have personally reviewed all the labs and imaging below    Basic Metabolic Panel: Recent Labs  Lab 05/25/20 1325  NA 140  K 4.3   CL 108  CO2 24  GLUCOSE 164*  BUN 45*  CREATININE 1.89*  CALCIUM 9.2   Liver Function Tests: Recent Labs  Lab 05/25/20 1325  AST 23  ALT 23  ALKPHOS 56  BILITOT 0.6  PROT 7.6  ALBUMIN 3.5   No results for input(s): LIPASE, AMYLASE in the last 168 hours. No results for input(s): AMMONIA in the last 168 hours. CBC: Recent Labs  Lab  05/25/20 1325  WBC 5.9  HGB 8.8*  HCT 28.4*  MCV 89.0  PLT 192   Cardiac Enzymes: No results for input(s): CKTOTAL, CKMB, CKMBINDEX, TROPONINI in the last 168 hours. BNP: Invalid input(s): POCBNP CBG: No results for input(s): GLUCAP in the last 168 hours.  Radiological Exams on Admission:  No results found.    EKG: Not done   A & P   Principal Problem:   Symptomatic anemia Active Problems:   DM type 2 (diabetes mellitus, type 2) (HCC)   Essential hypertension   Atrial fibrillation (HCC)   Tachycardia-bradycardia syndrome (HCC)   Pacemaker-[employer]   Hypothyroidism   GI bleed   1. Symptomatic anemia suspect secondary to GI bleed in the setting of coumadin and diverticulosis a. Hb 8.8, INR 1.1 having decreased energy recently b. Not taking NSAIDs c. Last coumadin dose was 3/22 d. NPO e. Protonix 40 mg IV BID f. Gentle IV fluids g. Trend H/H h. GI consulted  2. GERD a. protonix as above  3. Paroxysmal atrial fibrillation  Tachy/brady s/p PPM a. Continue Cardizem b. Holding coumadin, restart when able per GI  4. CKD 4 a. Creatinine below baseline  5. Hypertension  a. Continue cardizem  6. Hyperlipidemia a. Continue statin  7. Hypothyroid a. Continue synthroid  8. Type 2 Diabetes a. Glucose 164 b. Hold home meds while NPO    DVT prophylaxis: SCD   Code Status: Full Code  Diet: NPO Family Communication: Admission, patients condition and plan of care including tests being ordered have been discussed with the patient who indicates understanding and agrees with the plan and Code Status. Patient's  daughter was updated  Disposition Plan: The appropriate patient status for this patient is OBSERVATION. Observation status is judged to be reasonable and necessary in order to provide the required intensity of service to ensure the patient's safety. The patient's presenting symptoms, physical exam findings, and initial radiographic and laboratory data in the context of their medical condition is felt to place them at decreased risk for further clinical deterioration. Furthermore, it is anticipated that the patient will be medically stable for discharge from the hospital within 2 midnights of admission. The following factors support the patient status of observation.   " The patient's presenting symptoms include fatigue. " The physical exam findings include palor. " The initial radiographic and laboratory data are anemia.     Consultants  . GI  Procedures  . none  Time Spent on Admission: 63 minutes    Harold Hedge, DO [facility]  05/25/2020, 4:32 PM

## 2020-05-25 NOTE — ED Notes (Signed)
Attached foley bag to catheter.

## 2020-05-25 NOTE — ED Notes (Signed)
Plan of care - consult GI

## 2020-05-26 ENCOUNTER — Encounter (HOSPITAL_COMMUNITY)
Admission: EM | Disposition: A | Discharge status: Home / Self Care | Payer: Self-pay | Source: Ambulatory Visit | Attending: Family Medicine

## 2020-05-26 ENCOUNTER — Observation Stay (HOSPITAL_COMMUNITY): Payer: Medicare Other | Admitting: Certified Registered Nurse Anesthetist

## 2020-05-26 ENCOUNTER — Encounter (HOSPITAL_COMMUNITY): Payer: Self-pay | Admitting: Internal Medicine

## 2020-05-26 ENCOUNTER — Observation Stay (HOSPITAL_COMMUNITY): Payer: Medicare Other

## 2020-05-26 DIAGNOSIS — K529 Noninfective gastroenteritis and colitis, unspecified: Secondary | ICD-10-CM | POA: Diagnosis not present

## 2020-05-26 DIAGNOSIS — I129 Hypertensive chronic kidney disease with stage 1 through stage 4 chronic kidney disease, or unspecified chronic kidney disease: Secondary | ICD-10-CM | POA: Diagnosis present

## 2020-05-26 DIAGNOSIS — K449 Diaphragmatic hernia without obstruction or gangrene: Secondary | ICD-10-CM | POA: Diagnosis present

## 2020-05-26 DIAGNOSIS — K5900 Constipation, unspecified: Secondary | ICD-10-CM | POA: Diagnosis present

## 2020-05-26 DIAGNOSIS — Z8673 Personal history of transient ischemic attack (TIA), and cerebral infarction without residual deficits: Secondary | ICD-10-CM | POA: Diagnosis not present

## 2020-05-26 DIAGNOSIS — K922 Gastrointestinal hemorrhage, unspecified: Secondary | ICD-10-CM | POA: Diagnosis not present

## 2020-05-26 DIAGNOSIS — Z20822 Contact with and (suspected) exposure to covid-19: Secondary | ICD-10-CM | POA: Diagnosis present

## 2020-05-26 DIAGNOSIS — Z8719 Personal history of other diseases of the digestive system: Secondary | ICD-10-CM | POA: Diagnosis not present

## 2020-05-26 DIAGNOSIS — N189 Chronic kidney disease, unspecified: Secondary | ICD-10-CM | POA: Diagnosis not present

## 2020-05-26 DIAGNOSIS — K2970 Gastritis, unspecified, without bleeding: Secondary | ICD-10-CM | POA: Diagnosis present

## 2020-05-26 DIAGNOSIS — D649 Anemia, unspecified: Secondary | ICD-10-CM | POA: Diagnosis present

## 2020-05-26 DIAGNOSIS — Z831 Family history of other infectious and parasitic diseases: Secondary | ICD-10-CM | POA: Diagnosis not present

## 2020-05-26 DIAGNOSIS — D62 Acute posthemorrhagic anemia: Secondary | ICD-10-CM | POA: Diagnosis not present

## 2020-05-26 DIAGNOSIS — D631 Anemia in chronic kidney disease: Secondary | ICD-10-CM | POA: Diagnosis present

## 2020-05-26 DIAGNOSIS — Z833 Family history of diabetes mellitus: Secondary | ICD-10-CM | POA: Diagnosis not present

## 2020-05-26 DIAGNOSIS — E119 Type 2 diabetes mellitus without complications: Secondary | ICD-10-CM | POA: Diagnosis not present

## 2020-05-26 DIAGNOSIS — Z7901 Long term (current) use of anticoagulants: Secondary | ICD-10-CM | POA: Diagnosis not present

## 2020-05-26 DIAGNOSIS — I495 Sick sinus syndrome: Secondary | ICD-10-CM | POA: Diagnosis present

## 2020-05-26 DIAGNOSIS — R195 Other fecal abnormalities: Secondary | ICD-10-CM | POA: Diagnosis present

## 2020-05-26 DIAGNOSIS — Z9049 Acquired absence of other specified parts of digestive tract: Secondary | ICD-10-CM | POA: Diagnosis not present

## 2020-05-26 DIAGNOSIS — E785 Hyperlipidemia, unspecified: Secondary | ICD-10-CM | POA: Diagnosis present

## 2020-05-26 DIAGNOSIS — Z950 Presence of cardiac pacemaker: Secondary | ICD-10-CM | POA: Diagnosis not present

## 2020-05-26 DIAGNOSIS — Z66 Do not resuscitate: Secondary | ICD-10-CM | POA: Diagnosis present

## 2020-05-26 DIAGNOSIS — E1122 Type 2 diabetes mellitus with diabetic chronic kidney disease: Secondary | ICD-10-CM | POA: Diagnosis present

## 2020-05-26 DIAGNOSIS — Z792 Long term (current) use of antibiotics: Secondary | ICD-10-CM | POA: Diagnosis not present

## 2020-05-26 DIAGNOSIS — N184 Chronic kidney disease, stage 4 (severe): Secondary | ICD-10-CM | POA: Diagnosis present

## 2020-05-26 DIAGNOSIS — I10 Essential (primary) hypertension: Secondary | ICD-10-CM | POA: Diagnosis not present

## 2020-05-26 DIAGNOSIS — I4891 Unspecified atrial fibrillation: Secondary | ICD-10-CM

## 2020-05-26 DIAGNOSIS — K3189 Other diseases of stomach and duodenum: Secondary | ICD-10-CM | POA: Diagnosis not present

## 2020-05-26 DIAGNOSIS — D509 Iron deficiency anemia, unspecified: Secondary | ICD-10-CM | POA: Diagnosis not present

## 2020-05-26 DIAGNOSIS — K219 Gastro-esophageal reflux disease without esophagitis: Secondary | ICD-10-CM | POA: Diagnosis present

## 2020-05-26 DIAGNOSIS — E039 Hypothyroidism, unspecified: Secondary | ICD-10-CM | POA: Diagnosis present

## 2020-05-26 DIAGNOSIS — K2971 Gastritis, unspecified, with bleeding: Secondary | ICD-10-CM | POA: Diagnosis not present

## 2020-05-26 DIAGNOSIS — K222 Esophageal obstruction: Secondary | ICD-10-CM | POA: Diagnosis present

## 2020-05-26 DIAGNOSIS — I480 Paroxysmal atrial fibrillation: Secondary | ICD-10-CM | POA: Diagnosis present

## 2020-05-26 HISTORY — PX: ESOPHAGOGASTRODUODENOSCOPY (EGD) WITH PROPOFOL: SHX5813

## 2020-05-26 LAB — BASIC METABOLIC PANEL
Anion gap: 11 (ref 5–15)
BUN: 37 mg/dL — ABNORMAL HIGH (ref 8–23)
CO2: 20 mmol/L — ABNORMAL LOW (ref 22–32)
Calcium: 8.7 mg/dL — ABNORMAL LOW (ref 8.9–10.3)
Chloride: 109 mmol/L (ref 98–111)
Creatinine, Ser: 2 mg/dL — ABNORMAL HIGH (ref 0.44–1.00)
GFR, Estimated: 24 mL/min — ABNORMAL LOW (ref >60)
Glucose, Bld: 77 mg/dL (ref 70–99)
Potassium: 3.9 mmol/L (ref 3.5–5.1)
Sodium: 140 mmol/L (ref 135–145)

## 2020-05-26 LAB — PROTIME-INR
INR: 1.3 — ABNORMAL HIGH (ref 0.8–1.2)
Prothrombin Time: 15.5 seconds — ABNORMAL HIGH (ref 11.4–15.2)

## 2020-05-26 LAB — CBC
HCT: 25.9 % — ABNORMAL LOW (ref 36.0–46.0)
Hemoglobin: 8 g/dL — ABNORMAL LOW (ref 12.0–15.0)
MCH: 28 pg (ref 26.0–34.0)
MCHC: 30.9 g/dL (ref 30.0–36.0)
MCV: 90.6 fL (ref 80.0–100.0)
Platelets: 180 10*3/uL (ref 150–400)
RBC: 2.86 MIL/uL — ABNORMAL LOW (ref 3.87–5.11)
RDW: 17.2 % — ABNORMAL HIGH (ref 11.5–15.5)
WBC: 6.5 10*3/uL (ref 4.0–10.5)
nRBC: 0 % (ref 0.0–0.2)

## 2020-05-26 LAB — HEMOGLOBIN AND HEMATOCRIT, BLOOD
HCT: 26.1 % — ABNORMAL LOW (ref 36.0–46.0)
Hemoglobin: 7.9 g/dL — ABNORMAL LOW (ref 12.0–15.0)

## 2020-05-26 SURGERY — ESOPHAGOGASTRODUODENOSCOPY (EGD) WITH PROPOFOL
Anesthesia: Monitor Anesthesia Care

## 2020-05-26 MED ORDER — IOHEXOL 9 MG/ML PO SOLN
ORAL | Status: AC
  Administered 2020-05-26: 500 mL
  Filled 2020-05-26: qty 1000

## 2020-05-26 MED ORDER — PROPOFOL 500 MG/50ML IV EMUL
INTRAVENOUS | Status: AC
  Filled 2020-05-26: qty 50

## 2020-05-26 MED ORDER — PROPOFOL 10 MG/ML IV BOLUS
INTRAVENOUS | Status: DC | PRN
  Administered 2020-05-26: 30 mg via INTRAVENOUS

## 2020-05-26 MED ORDER — LIDOCAINE 2% (20 MG/ML) 5 ML SYRINGE
INTRAMUSCULAR | Status: DC | PRN
  Administered 2020-05-26: 60 mg via INTRAVENOUS

## 2020-05-26 MED ORDER — WARFARIN SODIUM 3 MG PO TABS
3.0000 mg | ORAL_TABLET | Freq: Once | ORAL | Status: AC
  Administered 2020-05-26: 3 mg via ORAL
  Filled 2020-05-26: qty 1

## 2020-05-26 MED ORDER — DILTIAZEM HCL ER BEADS 120 MG PO CP24: 120.0000 mg | ORAL_CAPSULE | Freq: Every day | ORAL | Status: DC

## 2020-05-26 MED ORDER — PROPOFOL 10 MG/ML IV BOLUS
INTRAVENOUS | Status: AC
  Filled 2020-05-26: qty 20

## 2020-05-26 MED ORDER — CHLORHEXIDINE GLUCONATE CLOTH 2 % EX PADS
6.0000 | MEDICATED_PAD | Freq: Every day | CUTANEOUS | Status: DC
  Administered 2020-05-27: 6 via TOPICAL

## 2020-05-26 MED ORDER — IOHEXOL 9 MG/ML PO SOLN
500.0000 mL | ORAL | Status: AC
  Administered 2020-05-26 (×2): 500 mL via ORAL

## 2020-05-26 MED ORDER — PROPOFOL 500 MG/50ML IV EMUL
INTRAVENOUS | Status: DC | PRN
  Administered 2020-05-26: 75 ug/kg/min via INTRAVENOUS

## 2020-05-26 MED ORDER — POLYETHYLENE GLYCOL 3350 17 G PO PACK
17.0000 g | PACK | Freq: Every day | ORAL | Status: DC
  Filled 2020-05-26: qty 1

## 2020-05-26 MED ORDER — WARFARIN - PHARMACIST DOSING INPATIENT: Freq: Every day | Status: DC

## 2020-05-26 MED ORDER — PSYLLIUM 95 % PO PACK
1.0000 | PACK | Freq: Every day | ORAL | Status: DC
  Administered 2020-05-27: 1 via ORAL
  Filled 2020-05-26: qty 1

## 2020-05-26 MED ORDER — SODIUM CHLORIDE 0.9 % IV SOLN: INTRAVENOUS | Status: DC

## 2020-05-26 SURGICAL SUPPLY — 15 items

## 2020-05-26 NOTE — Anesthesia Postprocedure Evaluation (Signed)
Anesthesia Post Note  Patient: Tiffany [name]  Procedure(s) Performed: ESOPHAGOGASTRODUODENOSCOPY (EGD) WITH PROPOFOL (N/A )     Patient location during evaluation: PACU Anesthesia Type: MAC Level of consciousness: awake and alert Pain management: pain level controlled Vital Signs Assessment: post-procedure vital signs reviewed and stable Respiratory status: spontaneous breathing, nonlabored ventilation, respiratory function stable and patient connected to nasal cannula oxygen Cardiovascular status: stable and blood pressure returned to baseline Postop Assessment: no apparent nausea or vomiting Anesthetic complications: no   No complications documented.  Last Vitals:  Vitals:   05/26/20 1040 05/26/20 1102  BP: (!) 136/39 (!) 119/59  Pulse: (!) 59 60  Resp: 18 16  Temp:  36.7 C  SpO2: 97% 97%    Last Pain:  Vitals:   05/26/20 1102  TempSrc: Oral  PainSc:                  [name]

## 2020-05-26 NOTE — Consult Note (Signed)
[facility] Consult  Referring Provider: ER Primary Care Physician:  Antony Contras, MD Primary Gastroenterologist: Dr. [facility]  Reason for Consultation: Worsening anemia, dark stools, was on Coumadin  HPI: Tiffany [name] is a 87 y.o. female was sent to the ER after being seen in the office by our PA for suspected upper GI bleed. Patient's daughter is at bedside who provides most of the history.  According to her, patient is known to have chronic kidney disease and was noted to have a hemoglobin of 10.6 with her nephrologist on 05/16/2020.  Follow-up hemoglobin with the primary care physician showed that it had dropped to 9.2 and out of 3 stool samples to tested positive for occult blood.  She was subsequently advised to stop her Coumadin which she has not taken for the last 4 days and advised to follow-up with GI. Patient is on oral iron and states that his stools have been dark. As per her daughter she has lost 15 pounds in the last 6 months. She has been complaining of chronic fatigue and overall not feeling well. Patient's last colonoscopy was in 2015 which showed diverticulosis, hyperplastic polyps and internal hemorrhoids. Patient denies difficulty swallowing, pain on swallowing, acid reflux or heartburn. She complains of bloating and intermittent abdominal pain but denies early satiety. She endorses a decreased appetite. She denies nausea or vomiting.   Past Medical History:  Diagnosis Date  . Diverticulosis   . DM2 (diabetes mellitus, type 2) ([place])   . Esophageal reflux   . HTN (hypertension)   . Hyperlipidemia   . Hypothyroidism   . Microalbuminuria    last done in 1/07   . Paroxysmal atrial fibrillation (HCC)    and atrial tachycardia  . Stroke ([place]) 1999  . Tachycardia-bradycardia syndrome ([place])    s/p PPM  . Unspecified hypothyroidism 11/20/2013    Past Surgical History:  Procedure Laterality Date  . CHOLECYSTECTOMY    . COLONOSCOPY N/A  11/21/2013   Procedure: COLONOSCOPY;  Surgeon: Jeryl Columbia, MD;  Location: [facility] ENDOSCOPY;  Service: Endoscopy;  Laterality: N/A;  . HEMORRHOID SURGERY    . hysterectomy (other)    . PACEMAKER GENERATOR CHANGE  10/03/12   DMT Advisa DR gen change by Dr Rayann Heman  . PACEMAKER GENERATOR CHANGE N/A 10/03/2012   Procedure: PACEMAKER GENERATOR CHANGE;  Surgeon: Thompson Grayer, MD;  Location: [facility] CATH LAB;  Service: Cardiovascular;  Laterality: N/A;  . permanent pacemaker  1999   updated 2006 (MDT) by Dr Verlon Setting; gen change 09-2012 by Dr Rayann Heman (MDT)  . PPM GENERATOR CHANGEOUT N/A 12/11/2019   Procedure: PPM GENERATOR CHANGEOUT;  Surgeon: Thompson Grayer, MD;  Location: [place] CV LAB;  Service: Cardiovascular;  Laterality: N/A;    Prior to Admission medications   Medication Sig Start Date End Date Taking? Authorizing Provider  diltiazem (TIAZAC) 120 MG 24 hr capsule Take 120 mg by mouth daily.   Yes [provider]  FERGON 240 (27 FE) MG tablet Take 240 mg by mouth in the morning and at bedtime. 10/31/14  Yes [provider]  furosemide (LASIX) 20 MG tablet Take 20 mg by mouth daily.   Yes [provider]  glipiZIDE (GLUCOTROL) 5 MG tablet Take 5 mg by mouth daily before breakfast.    Yes [provider]  levothyroxine (SYNTHROID, LEVOTHROID) 50 MCG tablet Take 50 mcg by mouth daily before breakfast.    Yes [provider]  Multiple Vitamins-Minerals (MULTIVITAMIN WITH MINERALS) tablet Take 1 tablet by  mouth daily.   Yes [provider]  OZEMPIC, 1 MG/DOSE, 4 MG/3ML SOPN Inject 1 mg into the skin once a week. 04/29/20  Yes [provider]  potassium chloride SA (K-DUR,KLOR-CON) 20 MEQ tablet Take 20 mEq by mouth daily.   Yes [provider]  rosuvastatin (CRESTOR) 10 MG tablet Take 10 mg by mouth daily.   Yes [provider]  warfarin (COUMADIN) 2 MG tablet Take 2 mg by mouth daily.   Yes [provider]  cephALEXin  (KEFLEX) 500 MG capsule Take 1 capsule (500 mg total) by mouth 4 (four) times daily. Patient not taking: No sig reported 12/23/19   Vickie Epley, MD    Current Facility-Administered Medications  Medication Dose Route Frequency Provider Last Rate Last Admin  . Chlorhexidine Gluconate Cloth 2 % PADS 6 each  6 each Topical Daily Harold Hedge, MD      . diltiazem (CARDIZEM CD) 24 hr capsule 120 mg  120 mg Oral Daily Harold Hedge, MD      . furosemide (LASIX) tablet 20 mg  20 mg Oral Daily Harold Hedge, MD      . levothyroxine (SYNTHROID) tablet 50 mcg  50 mcg Oral QAC breakfast Harold Hedge, MD      . pantoprazole (PROTONIX) injection 40 mg  40 mg Intravenous Q12H Harold Hedge, MD   40 mg at 05/25/20 2240  . rosuvastatin (CRESTOR) tablet 10 mg  10 mg Oral Daily Marva Panda E, MD      . sodium chloride flush (NS) 0.9 % injection 3 mL  3 mL Intravenous Q12H Harold Hedge, MD   3 mL at 05/25/20 2240    Allergies as of 05/25/2020 - Review Complete 05/25/2020  Allergen Reaction Noted  . Other Other (See Comments) 05/25/2020  . Sitagliptin  05/25/2020    Family History  Problem Relation Age of Onset  . Tuberculosis Mother   . Diabetes Father     Social History   Socioeconomic History  . Marital status: Divorced    Spouse name: Not on file  . Number of children: Not on file  . Years of education: Not on file  . Highest education level: Not on file  Occupational History  . Not on file  Tobacco Use  . Smoking status: Never Smoker  . Smokeless tobacco: Never Used  Vaping Use  . Vaping Use: Never used  Substance and Sexual Activity  . Alcohol use: No  . Drug use: No  . Sexual activity: Never  Other Topics Concern  . Not on file  Social History Narrative   Lives in [place]. 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 Denies TED.   Social Determinants of Health   Financial Resource Strain: Not on file  Food Insecurity: Not on file   Transportation Needs: Not on file  Physical Activity: Not on file  Stress: Not on file  Social Connections: Not on file  Intimate Partner Violence: Not on file    Review of Systems: As per HPI.  Physical Exam: Vital signs in last 24 hours: Temp:  [97.6 F (36.4 C)-98.7 F (37.1 C)] 98.1 F (36.7 C) (03/30 0511) Pulse Rate:  [59-85] 60 (03/30 0511) Resp:  [15-20] 18 (03/30 0511) BP: (101-151)/(44-64) 101/63 (03/30 0511) SpO2:  [94 %-100 %] 97 % (03/30 0511) Weight:  [69.4 kg] 69.4 kg (03/29 1255) Last BM Date: 05/24/20  General:   Alert,  Well-developed, overweight, pleasant  and cooperative in NAD Head:  Normocephalic and atraumatic. Eyes:  Sclera clear, no icterus.  Mild pallor Ears:  Normal auditory acuity. Nose:  No deformity, discharge,  or lesions. Mouth:  No deformity or lesions.  Oropharynx pink & moist. Neck:  Supple; no masses or thyromegaly. Lungs:  Clear throughout to auscultation.   No wheezes, crackles, or rhonchi. No acute distress. Heart: Permanent pacemaker over right upper chest, otherwise regular rate and rhythm. Extremities:  Without clubbing or edema. Neurologic:  Alert and  oriented x4;  grossly normal neurologically.  Slightly hard of hearing Skin:  Intact without significant lesions or rashes. Psych:  Alert and cooperative. Normal mood and affect. Abdomen:  Soft, nontender and nondistended. No masses, hepatosplenomegaly or hernias noted. Normal bowel sounds, without guarding, and without rebound.         Lab Results: Recent Labs    05/25/20 1325 05/25/20 2047 05/26/20 0027  WBC 5.9  --  6.5  HGB 8.8* 7.9* 7.9*  8.0*  HCT 28.4* 25.5* 26.1*  25.9*  PLT 192  --  180   BMET Recent Labs    05/25/20 1325 05/26/20 0027  NA 140 140  K 4.3 3.9  CL 108 109  CO2 24 20*  GLUCOSE 164* 77  BUN 45* 37*  CREATININE 1.89* 2.00*  CALCIUM 9.2 8.7*   LFT Recent Labs    05/25/20 1325  PROT 7.6  ALBUMIN 3.5  AST 23  ALT 23  ALKPHOS 56   BILITOT 0.6   PT/INR Recent Labs    05/25/20 1344 05/26/20 0027  LABPROT 14.1 15.5*  INR 1.1 1.3*    Studies/Results: No results found.  Impression: Symptomatic anemia, hemoglobin between 7.9-8, elevated BUN and creatinine ratio and FOBT positive stool. Was on Coumadin, held for the last 4 days with PT/INR of 15.5 and 1.3 today  Multiple comorbidities-old age, type 2 diabetes, history of diverticulosis, chronic kidney disease stage IV, hypertension, dyslipidemia, paroxysmal atrial fibrillation, tachybradycardia syndrome with permanent pacemaker in place and hypothyroidism  Plan: EGD today. The risks and the benefits of the procedure were discussed with the patient and her daughter at bedside. Patient currently hemodynamically stable. N.p.o. post midnight and on pantoprazole 40 mg twice daily.   LOS: 0 days   Ronnette Juniper, MD  05/26/2020, 9:10 AM

## 2020-05-26 NOTE — Brief Op Note (Signed)
05/25/2020 - 05/26/2020  10:18 AM  PATIENT:  Karlyne Greenspan Arauz  87 y.o. female  PRE-OPERATIVE DIAGNOSIS:  bleeding  POST-OPERATIVE DIAGNOSIS:  gastritis  PROCEDURE:  Procedure(s): ESOPHAGOGASTRODUODENOSCOPY (EGD) WITH PROPOFOL (N/A)  SURGEON:  Surgeon(s) and Role:    Ronnette Juniper, MD - Primary  PHYSICIAN ASSISTANT:   ASSISTANTS: Elease Hashimoto Holloway,Tech  ANESTHESIA:   MAC  EBL:  None  BLOOD ADMINISTERED:none  DRAINS: none   LOCAL MEDICATIONS USED:  NONE  SPECIMEN:  No Specimen  DISPOSITION OF SPECIMEN:  N/A  COUNTS:  YES  TOURNIQUET:  * No tourniquets in log *  DICTATION: .Dragon Dictation  PLAN OF CARE: Admit to inpatient   PATIENT DISPOSITION:  PACU - hemodynamically stable.   Delay start of Pharmacological VTE agent (>24hrs) due to surgical blood loss or risk of bleeding: no

## 2020-05-26 NOTE — Transfer of Care (Signed)
Immediate Anesthesia Transfer of Care Note  Patient: Tiffany [name]  Procedure(s) Performed: ESOPHAGOGASTRODUODENOSCOPY (EGD) WITH PROPOFOL (N/A )  Patient Location: PACU and Endoscopy Unit  Anesthesia Type:MAC  Level of Consciousness: awake, drowsy and patient cooperative  Airway & Oxygen Therapy: Patient Spontanous Breathing and Patient connected to face mask oxygen  Post-op Assessment: Report given to RN and Post -op Vital signs reviewed and stable  Post vital signs: Reviewed and stable  Last Vitals:  Vitals Value Taken Time  BP    Temp    Pulse    Resp    SpO2      Last Pain:  Vitals:   05/26/20 0919  TempSrc: Oral  PainSc: 0-No pain         Complications: No complications documented.

## 2020-05-26 NOTE — Op Note (Signed)
[facility] Patient Name: Tiffany [name] Procedure Date: 05/26/2020 MRN: [id number] Attending MD: Ronnette Juniper , MD Date of Birth: [birth year] CSN: [id number] Age: 87 Admit Type: Inpatient Procedure:                Upper GI endoscopy Indications:              Unexplained iron deficiency anemia, Occult blood in                            stool Providers:                Ronnette Juniper, MD, Cleda Daub, RN, Tyna Jaksch                            Technician, Christell Faith, CRNA Referring MD:             [facility] Medicines:                Monitored Anesthesia Care Complications:            No immediate complications. Estimated Blood Loss:     Estimated blood loss: none. Procedure:                Pre-Anesthesia Assessment:                           - Prior to the procedure, a History and Physical                            was performed, and patient medications and                            allergies were reviewed. The patient's tolerance of                            previous anesthesia was also reviewed. The risks                            and benefits of the procedure and the sedation                            options and risks were discussed with the patient.                            All questions were answered, and informed consent                            was obtained. Prior Anticoagulants: The patient has                            taken Coumadin (warfarin), last dose was 5 days                            prior to procedure. ASA Grade Assessment: III - A  patient with severe systemic disease. After                            reviewing the risks and benefits, the patient was                            deemed in satisfactory condition to undergo the                            procedure.                           After obtaining informed consent, the endoscope was                            passed under direct vision.  Throughout the                            procedure, the patient's blood pressure, pulse, and                            oxygen saturations were monitored continuously. The                            [id number]-H190 ([id number]) Olympus gastroscope was                            introduced through the mouth, and advanced to the                            second part of duodenum. The upper GI endoscopy was                            accomplished without difficulty. The patient                            tolerated the procedure well. Scope In: Scope Out: Findings:      The upper third of the esophagus, middle third of the esophagus and       lower third of the esophagus were normal.      A widely patent Schatzki ring was found at the gastroesophageal junction.      A 2 cm hiatal hernia was present.      Bilious fluid was found in the gastric body.      Diffuse mildly erythematous mucosa without bleeding was found in the       stomach.      The cardia and gastric fundus were normal on retroflexion.      The examined duodenum was normal. Impression:               - Normal upper third of esophagus, middle third of                            esophagus and lower third of esophagus.                           -  Widely patent Schatzki ring.                           - 2 cm hiatal hernia.                           - Bilious gastric fluid.                           - Erythematous mucosa in the stomach.                           - Normal examined duodenum.                           - No specimens collected.                           - Rectal exam performed showed hard,pellet like                            dark green stools, large amount of fecal                            disimpaction performed. Moderate Sedation:      Patient did not receive moderate sedation for this procedure, but       instead received monitored anesthesia care. Recommendation:           - Renal diet today.                            - Perform CT scan (computed tomography) of the                            abdomen and pelvis without contrast today. Procedure Code(s):        --- Professional ---                           [phone], Esophagogastroduodenoscopy, flexible,                            transoral; diagnostic, including collection of                            specimen(s) by brushing or washing, when performed                            (separate procedure) Diagnosis Code(s):        --- Professional ---                           K22.2, Esophageal obstruction                           K44.9, Diaphragmatic hernia without obstruction or  gangrene                           K31.89, Other diseases of stomach and duodenum                           D50.9, Iron deficiency anemia, unspecified                           R19.5, Other fecal abnormalities CPT copyright 2019 American Medical Association. All rights reserved. The codes documented in this report are preliminary and upon coder review may  be revised to meet current compliance requirements. Ronnette Juniper, MD 05/26/2020 10:18:20 AM This report has been signed electronically. Number of Addenda: 0

## 2020-05-26 NOTE — Anesthesia Procedure Notes (Addendum)
Procedure Name: MAC Date/Time: 05/26/2020 9:56 AM Performed by: West Pugh, CRNA Pre-anesthesia Checklist: Patient identified, Emergency Drugs available, Suction available, Patient being monitored and Timeout performed Patient Re-evaluated:Patient Re-evaluated prior to induction Oxygen Delivery Method: Simple face mask Preoxygenation: Pre-oxygenation with 100% oxygen Induction Type: IV induction Placement Confirmation: positive ETCO2

## 2020-05-26 NOTE — Progress Notes (Signed)
PROGRESS NOTE  Tiffany [name]  ZOX:[id number] DOB: 1931-06-09 DOA: 05/25/2020 PCP: Antony Contras, MD  Outpatient Specialists: [facility] Brief Narrative: Tiffany [name] is an 87 y.o. female with a history of T2DM, stage IV CKD, diverticulosis, HTN, HLD, PAF on coumadin, CVA in 1990's, tachy-brady syndrome s/p PPM, hypothyroidism who recently had coumadin stopped by PCP due to anemia (hgb 10.6 > 9.2) and +FOBT and subsequently presented to [facility] where anemia was noted to have worsened and was sent to the ED. Hgb 8.8, INR 1.1. IV PPI was started and GI consulted, performing EGD 3/30 which showed only gastritis without bleeding. CT abd/pelvis was recommended in lieu of colonoscopy and is pending. Hgb dropped the day after admission to 7.9.   Assessment & Plan: Principal Problem:   Symptomatic anemia Active Problems:   DM type 2 (diabetes mellitus, type 2) (HCC)   Essential hypertension   Atrial fibrillation (HCC)   Tachycardia-bradycardia syndrome (HCC)   Pacemaker-[employer]   Hypothyroidism   GI bleed  Symptomatic anemia of CKD: No source of GI bleeding defined, though pt has history of diverticulosis and some constipation which could caused +FOBT in absence of gross bleeding. - Trend Hgb in AM as this has dropped further since admission and we're restarting coumadin.  - Check ferritin in AM, would benefit from IV iron most likely. Also discussed transfusion threshold of 7g/dl.  - CT abd/pelvis pending.   GERD, gastritis:  - Continue PPI  Paroxysmal atrial fibrillation, tachycardia-bradycardia syndrome s/p PPM:  - Continue diltiazem - Ok to restart coumadin per GI, will restart tonight. Risks and benefits of bridging with other anticoagulant were discussed with the patient and her daughter. Primary worry is bleeding, and they prefer to avoid precipitating that with additional anticoagulation. We will not bridge at this time as in some trials (e.g. BRIDGE trial) the risks  of hemorrhage were higher and risk of stroke was not significantly different. Risk of bleeding higher due to renal disease as well. She does have a history of stroke though this was decades ago, not recent.   Stage IV CKD:  - Follow up with [facility] per routine.  - Avoid nephrotoxins  T2DM:  - Monitor  HTN:  - Continue diltiazem   HLD:  - Continue statin  Hypothyroidism:  - Continue synthroid  DVT prophylaxis: Restart coumadin Code Status: DNR Family Communication: Daughter at bedside Disposition Plan:  Status is: Observation  The patient will require care spanning > 2 midnights and should be moved to inpatient because: Patient requires continued monitoring of anemia which has worsened since admission and may require transfusion in the setting of restarting anticoagulation.  Dispo: The patient is from: Home              Anticipated d/c is to: Home              Patient currently is not medically stable to d/c.   Difficult to place patient No  Consultants:   EGD 05/26/2020, Dr. Therisa Doyne:  Impression:       - Normal upper third of esophagus, middle third of esophagus and lower third of esophagus.                           - Widely patent Schatzki ring.                           - 2 cm hiatal hernia.                           -  Bilious gastric fluid.                           - Erythematous mucosa in the stomach.                           - Normal examined duodenum.                           - No specimens collected.                           - Rectal exam performed showed hard,pellet like                            dark green stools, large amount of fecal                            disimpaction performed. Recommendation: Renal diet today.                            - Perform CT scan (computed tomography) of the abdomen and pelvis without contrast today.GI  Procedures:    Antimicrobials:  None   Subjective: Feels fine. No bleeding noted. No chest pain or palpitations or  dyspnea. No cough. Tolerated 2 bottles of po contrast.   Objective: Vitals:   05/26/20 1020 05/26/20 1030 05/26/20 1040 05/26/20 1102  BP: (!) 126/47 (!) 140/43 (!) 136/39 (!) 119/59  Pulse: 61 60 (!) 59 60  Resp: (!) 21 19 18 16   Temp:    98 F (36.7 C)  TempSrc:    Oral  SpO2: 100% 100% 97% 97%  Weight:      Height:        Intake/Output Summary (Last 24 hours) at 05/26/2020 1723 Last data filed at 05/26/2020 1659 Gross per 24 hour  Intake 234.03 ml  Output 675 ml  Net -440.97 ml   Filed Weights   05/25/20 1255  Weight: 69.4 kg   Gen: Elderly female in no distress  Pulm: Non-labored breathing room air. Clear to auscultation bilaterally.  CV: Regular rate and rhythm. No murmur, rub, or gallop. No JVD, no pedal edema. GI: Abdomen soft, non-tender, non-distended, with normoactive bowel sounds. No organomegaly or masses felt. Ext: Warm, no deformities Skin: No rashes, lesions or ulcers Neuro: Alert and oriented. No focal neurological deficits. Psych: Judgement and insight appear normal. Mood & affect appropriate.   Data Reviewed: I have personally reviewed following labs and imaging studies  CBC: Recent Labs  Lab 05/25/20 1325 05/25/20 2047 05/26/20 0027  WBC 5.9  --  6.5  HGB 8.8* 7.9* 7.9*  8.0*  HCT 28.4* 25.5* 26.1*  25.9*  MCV 89.0  --  90.6  PLT 192  --  458   Basic Metabolic Panel: Recent Labs  Lab 05/25/20 1325 05/26/20 0027  NA 140 140  K 4.3 3.9  CL 108 109  CO2 24 20*  GLUCOSE 164* 77  BUN 45* 37*  CREATININE 1.89* 2.00*  CALCIUM 9.2 8.7*   GFR: Estimated Creatinine Clearance: 18.6 mL/min (A) (by C-G formula based on SCr of 2 mg/dL (H)). Liver Function Tests: Recent Labs  Lab 05/25/20 1325  AST 23  ALT 23  ALKPHOS 56  BILITOT 0.6  PROT 7.6  ALBUMIN 3.5   No results for input(s): LIPASE, AMYLASE in the last 168 hours. No results for input(s): AMMONIA in the last 168 hours. Coagulation Profile: Recent Labs  Lab 05/25/20 1344  05/26/20 0027  INR 1.1 1.3*   Cardiac Enzymes: No results for input(s): CKTOTAL, CKMB, CKMBINDEX, TROPONINI in the last 168 hours. BNP (last 3 results) No results for input(s): PROBNP in the last 8760 hours. HbA1C: No results for input(s): HGBA1C in the last 72 hours. CBG: No results for input(s): GLUCAP in the last 168 hours. Lipid Profile: No results for input(s): CHOL, HDL, LDLCALC, TRIG, CHOLHDL, LDLDIRECT in the last 72 hours. Thyroid Function Tests: No results for input(s): TSH, T4TOTAL, FREET4, T3FREE, THYROIDAB in the last 72 hours. Anemia Panel: No results for input(s): VITAMINB12, FOLATE, FERRITIN, TIBC, IRON, RETICCTPCT in the last 72 hours. Urine analysis:    Component Value Date/Time   COLORURINE YELLOW 11/18/2017 1555   APPEARANCEUR HAZY (A) 11/18/2017 1555   LABSPEC 1.011 11/18/2017 1555   PHURINE 6.0 11/18/2017 1555   GLUCOSEU NEGATIVE 11/18/2017 1555   HGBUR SMALL (A) 11/18/2017 1555   BILIRUBINUR NEGATIVE 11/18/2017 1555   BILIRUBINUR small 09/20/2011 1037   KETONESUR NEGATIVE 11/18/2017 1555   PROTEINUR NEGATIVE 11/18/2017 1555   UROBILINOGEN 0.2 11/18/2014 1941   NITRITE NEGATIVE 11/18/2017 1555   LEUKOCYTESUR LARGE (A) 11/18/2017 1555   Recent Results (from the past 240 hour(s))  Resp Panel by RT-PCR (Flu A&B, Covid) Nasopharyngeal Swab     Status: None   Collection Time: 05/25/20  2:29 PM   Specimen: Nasopharyngeal Swab; Nasopharyngeal(NP) swabs in vial transport medium  Result Value Ref Range Status   SARS Coronavirus 2 by RT PCR NEGATIVE NEGATIVE Final    Comment: (NOTE) SARS-CoV-2 target nucleic acids are NOT DETECTED.  The SARS-CoV-2 RNA is generally detectable in upper respiratory specimens during the acute phase of infection. The lowest concentration of SARS-CoV-2 viral copies this assay can detect is 138 copies/mL. A negative result does not preclude SARS-Cov-2 infection and should not be used as the sole basis for treatment or other  patient management decisions. A negative result may occur with  improper specimen collection/handling, submission of specimen other than nasopharyngeal swab, presence of viral mutation(s) within the areas targeted by this assay, and inadequate number of viral copies(<138 copies/mL). A negative result must be combined with clinical observations, patient history, and epidemiological information. The expected result is Negative.  Fact Sheet for Patients:  EntrepreneurPulse.com.au  Fact Sheet for Healthcare Providers:  IncredibleEmployment.be  This test is no t yet approved or cleared by the [place] FDA and  has been authorized for detection and/or diagnosis of SARS-CoV-2 by FDA under an Emergency Use Authorization (EUA). This EUA will remain  in effect (meaning this test can be used) for the duration of the COVID-19 declaration under Section 564(b)(1) of the Act, 21 U.S.C.section 360bbb-3(b)(1), unless the authorization is terminated  or revoked sooner.       Influenza A by PCR NEGATIVE NEGATIVE Final   Influenza B by PCR NEGATIVE NEGATIVE Final    Comment: (NOTE) The Xpert Xpress SARS-CoV-2/FLU/RSV plus assay is intended as an aid in the diagnosis of influenza from Nasopharyngeal swab specimens and should not be used as a sole basis for treatment. Nasal washings and aspirates are unacceptable for Xpert Xpress SARS-CoV-2/FLU/RSV testing.  Fact Sheet for Patients: EntrepreneurPulse.com.au  Fact Sheet for Healthcare Providers: IncredibleEmployment.be  This test is not yet  approved or cleared by the [place] and has been authorized for detection and/or diagnosis of SARS-CoV-2 by FDA under an Emergency Use Authorization (EUA). This EUA will remain in effect (meaning this test can be used) for the duration of the COVID-19 declaration under Section 564(b)(1) of the Act, 21 U.S.C. section  360bbb-3(b)(1), unless the authorization is terminated or revoked.  Performed at [facility], [place] [address] [place], [place] [address]       Radiology Studies: CT ABDOMEN PELVIS WO CONTRAST  Result Date: 05/26/2020 CLINICAL DATA:  Gastrointestinal bleeding, anemia EXAM: CT ABDOMEN AND PELVIS WITHOUT CONTRAST TECHNIQUE: Multidetector CT imaging of the abdomen and pelvis was performed following the standard protocol without IV contrast. COMPARISON:  10/30/2016 FINDINGS: Lower chest: Bilateral lower lobe bronchiectasis with bronchial wall thickening with patchy consolidation, consistent with bronchitis and bibasilar bronchopneumonia versus atelectasis. No effusion or pneumothorax. Dense calcification of the mitral annulus again noted. Unenhanced CT was performed per clinician order. Lack of IV contrast limits sensitivity and specificity, especially for evaluation of abdominal/pelvic solid viscera. Hepatobiliary: No focal liver abnormality is seen. Status post cholecystectomy. No biliary dilatation. Pancreas: Unremarkable. No pancreatic ductal dilatation or surrounding inflammatory changes. Spleen: Normal in size without focal abnormality. Adrenals/Urinary Tract: No urinary tract calculi or obstructive uropathy. Right renal cysts are identified, simple in appearance. The adrenals are unremarkable. Bladder is decompressed with a Foley catheter. Stomach/Bowel: No bowel obstruction or ileus. Diffuse diverticulosis of the distal colon without diverticulitis. Normal appendix right lower quadrant. No bowel wall thickening or inflammatory change. Vascular/Lymphatic: Stable atherosclerosis of the abdominal aorta. No pathologic adenopathy within the abdomen or pelvis. Reproductive: Status post hysterectomy. No adnexal masses. Other: No free fluid or free gas.  No abdominal wall hernia. Musculoskeletal: No acute or destructive bony lesions. Reconstructed images demonstrate no additional findings.  IMPRESSION: 1. Bilateral lower lobe bronchiectasis with bronchial wall thickening and patchy lower lobe consolidation, favor bronchitis and mild bronchopneumonia. 2. Distal colonic diverticulosis without diverticulitis. 3.  Aortic Atherosclerosis (ICD10-I70.0). Electronically Signed   By: Randa Ngo M.D.   On: 05/26/2020 17:13    Scheduled Meds: . Chlorhexidine Gluconate Cloth  6 each Topical Daily  . diltiazem  120 mg Oral Daily  . furosemide  20 mg Oral Daily  . levothyroxine  50 mcg Oral QAC breakfast  . pantoprazole (PROTONIX) IV  40 mg Intravenous Q12H  . polyethylene glycol  17 g Oral Daily  . psyllium  1 packet Oral Daily  . rosuvastatin  10 mg Oral Daily  . sodium chloride flush  3 mL Intravenous Q12H   Continuous Infusions:   LOS: 0 days   Time spent: 25 minutes.  Patrecia Pour, MD [facility] [email or web address] 05/26/2020, 5:23 PM

## 2020-05-26 NOTE — Anesthesia Preprocedure Evaluation (Addendum)
Anesthesia Evaluation  Patient identified by MRN, date of birth, ID band Patient awake    Reviewed: Allergy & Precautions, NPO status , Patient's Chart, lab work & pertinent test results  Airway Mallampati: II  TM Distance: >3 FB Neck ROM: Full    Dental  (+) Edentulous Upper   Pulmonary neg pulmonary ROS,    breath sounds clear to auscultation       Cardiovascular hypertension, Pt. on medications + dysrhythmias Atrial Fibrillation + pacemaker  Rhythm:Regular Rate:Normal     Neuro/Psych CVA    GI/Hepatic Neg liver ROS, GERD  ,  Endo/Other  diabetes, Type 2Hypothyroidism   Renal/GU Renal disease     Musculoskeletal   Abdominal   Peds  Hematology  (+) Blood dyscrasia, anemia ,   Anesthesia Other Findings   Reproductive/Obstetrics                            Anesthesia Physical Anesthesia Plan  ASA: III  Anesthesia Plan: MAC   Post-op Pain Management:    Induction:   PONV Risk Score and Plan: 2 and Propofol infusion, Ondansetron and Treatment may vary due to age or medical condition  Airway Management Planned: Natural Airway and Nasal Cannula  Additional Equipment:   Intra-op Plan:   Post-operative Plan:   Informed Consent: I have reviewed the patients History and Physical, chart, labs and discussed the procedure including the risks, benefits and alternatives for the proposed anesthesia with the patient or authorized representative who has indicated his/her understanding and acceptance.   Patient has DNR.  Discussed DNR with patient and Suspend DNR.     Plan Discussed with:   Anesthesia Plan Comments:        Anesthesia Quick Evaluation

## 2020-05-26 NOTE — Progress Notes (Signed)
ANTICOAGULATION CONSULT NOTE - Initial Consult  Pharmacy Consult for warfarin Indication: atrial fibrillation  Allergies  Allergen Reactions  . Other Other (See Comments)    Nuts, Corn, popcorn - diverticulitis  . Sitagliptin     Other reaction(s): Unknown    Patient Measurements: Height: 5' 2.5" (158.8 cm) Weight: 69.4 kg (153 lb) IBW/kg (Calculated) : 51.25 Heparin Dosing Weight:   Vital Signs: Temp: 98 F (36.7 C) (03/30 1102) Temp Source: Oral (03/30 1102) BP: 119/59 (03/30 1102) Pulse Rate: 60 (03/30 1102)  Labs: Recent Labs    05/25/20 1325 05/25/20 1344 05/25/20 2047 05/26/20 0027  HGB 8.8*  --  7.9* 7.9*  8.0*  HCT 28.4*  --  25.5* 26.1*  25.9*  PLT 192  --   --  180  LABPROT  --  14.1  --  15.5*  INR  --  1.1  --  1.3*  CREATININE 1.89*  --   --  2.00*    Estimated Creatinine Clearance: 18.6 mL/min (A) (by C-G formula based on SCr of 2 mg/dL (H)).   Medical History: Past Medical History:  Diagnosis Date  . Chronic kidney disease   . Diverticulosis   . DM2 (diabetes mellitus, type 2) ([place])   . Esophageal reflux   . HTN (hypertension)   . Hyperlipidemia   . Hypothyroidism   . Microalbuminuria    last done in 1/07   . Paroxysmal atrial fibrillation (HCC)    and atrial tachycardia  . Presence of permanent cardiac pacemaker   . Stroke ([place]) 1999  . Tachycardia-bradycardia syndrome ([place])    s/p PPM  . Unspecified hypothyroidism 11/20/2013    Medications:  Medications Prior to Admission  Medication Sig Dispense Refill Last Dose  . diltiazem (TIAZAC) 120 MG 24 hr capsule Take 120 mg by mouth daily.     . FERGON 240 (27 FE) MG tablet Take 240 mg by mouth in the morning and at bedtime.  3 05/25/2020 at Unknown time  . furosemide (LASIX) 20 MG tablet Take 20 mg by mouth daily.   05/25/2020 at Unknown time  . glipiZIDE (GLUCOTROL) 5 MG tablet Take 5 mg by mouth daily before breakfast.    05/24/2020 at Unknown time  . levothyroxine (SYNTHROID,  LEVOTHROID) 50 MCG tablet Take 50 mcg by mouth daily before breakfast.    05/25/2020 at Unknown time  . Multiple Vitamins-Minerals (MULTIVITAMIN WITH MINERALS) tablet Take 1 tablet by mouth daily.   05/25/2020 at Unknown time  . OZEMPIC, 1 MG/DOSE, 4 MG/3ML SOPN Inject 1 mg into the skin once a week.   05/24/2020  . potassium chloride SA (K-DUR,KLOR-CON) 20 MEQ tablet Take 20 mEq by mouth daily.   05/25/2020 at Unknown time  . rosuvastatin (CRESTOR) 10 MG tablet Take 10 mg by mouth daily.   05/25/2020 at Unknown time  . warfarin (COUMADIN) 2 MG tablet Take 2 mg by mouth daily.   05/18/2020 at 9 am  . cephALEXin (KEFLEX) 500 MG capsule Take 1 capsule (500 mg total) by mouth 4 (four) times daily. (Patient not taking: No sig reported) 28 capsule 0 Not Taking at Unknown time    Assessment: 87 yo F on warfarin PTA for Afib.  Pharmacy consulted to resume warfarin tonight.  Warfarin was held on admit for symptomatic anemia. No source of bleeding defined. Per [facility] OK to restart warfarin tonight per GI. PTA dose warfarin 2 mg daily.  INR today = 1.3; Hg 7.9/8 PLTC 180.    Goal of  Therapy:  INR 2-3 Monitor platelets by anticoagulation protocol: Yes   Plan:  Warfarin 3 mg po x 1 dose today Daily INR  Eudelia Bunch, Pharm.D 05/26/2020 6:03 PM

## 2020-05-27 DIAGNOSIS — K922 Gastrointestinal hemorrhage, unspecified: Secondary | ICD-10-CM | POA: Diagnosis not present

## 2020-05-27 DIAGNOSIS — E119 Type 2 diabetes mellitus without complications: Secondary | ICD-10-CM

## 2020-05-27 DIAGNOSIS — E039 Hypothyroidism, unspecified: Secondary | ICD-10-CM

## 2020-05-27 DIAGNOSIS — I495 Sick sinus syndrome: Secondary | ICD-10-CM

## 2020-05-27 DIAGNOSIS — I10 Essential (primary) hypertension: Secondary | ICD-10-CM

## 2020-05-27 DIAGNOSIS — D649 Anemia, unspecified: Secondary | ICD-10-CM | POA: Diagnosis not present

## 2020-05-27 DIAGNOSIS — Z950 Presence of cardiac pacemaker: Secondary | ICD-10-CM

## 2020-05-27 DIAGNOSIS — I4891 Unspecified atrial fibrillation: Secondary | ICD-10-CM | POA: Diagnosis not present

## 2020-05-27 LAB — BASIC METABOLIC PANEL
Anion gap: 10 (ref 5–15)
BUN: 36 mg/dL — ABNORMAL HIGH (ref 8–23)
CO2: 23 mmol/L (ref 22–32)
Calcium: 8.9 mg/dL (ref 8.9–10.3)
Chloride: 105 mmol/L (ref 98–111)
Creatinine, Ser: 2.22 mg/dL — ABNORMAL HIGH (ref 0.44–1.00)
GFR, Estimated: 21 mL/min — ABNORMAL LOW (ref >60)
Glucose, Bld: 149 mg/dL — ABNORMAL HIGH (ref 70–99)
Potassium: 4.8 mmol/L (ref 3.5–5.1)
Sodium: 138 mmol/L (ref 135–145)

## 2020-05-27 LAB — IRON AND TIBC
Iron: 33 ug/dL (ref 28–170)
Saturation Ratios: 10 % — ABNORMAL LOW (ref 10.4–31.8)
TIBC: 334 ug/dL (ref 250–450)
UIBC: 301 ug/dL

## 2020-05-27 LAB — PROTIME-INR
INR: 1.2 (ref 0.8–1.2)
Prothrombin Time: 15 seconds (ref 11.4–15.2)

## 2020-05-27 LAB — CBC
HCT: 26.9 % — ABNORMAL LOW (ref 36.0–46.0)
Hemoglobin: 8.4 g/dL — ABNORMAL LOW (ref 12.0–15.0)
MCH: 27.6 pg (ref 26.0–34.0)
MCHC: 31.2 g/dL (ref 30.0–36.0)
MCV: 88.5 fL (ref 80.0–100.0)
Platelets: 182 10*3/uL (ref 150–400)
RBC: 3.04 MIL/uL — ABNORMAL LOW (ref 3.87–5.11)
RDW: 17.4 % — ABNORMAL HIGH (ref 11.5–15.5)
WBC: 8.9 10*3/uL (ref 4.0–10.5)
nRBC: 0 % (ref 0.0–0.2)

## 2020-05-27 LAB — FERRITIN: Ferritin: 62 ng/mL (ref 11–307)

## 2020-05-27 MED ORDER — PANTOPRAZOLE SODIUM 40 MG PO TBEC: 40.0000 mg | DELAYED_RELEASE_TABLET | Freq: Every day | ORAL | 0 refills | Status: AC

## 2020-05-27 MED ORDER — SODIUM CHLORIDE 0.9 % IV SOLN
125.0000 mg | Freq: Once | INTRAVENOUS | Status: AC
  Administered 2020-05-27: 125 mg via INTRAVENOUS
  Filled 2020-05-27: qty 10

## 2020-05-27 NOTE — Discharge Summary (Signed)
Physician Discharge Summary  Tiffany Tiffany [name] CZY:[id number] DOB: 1931/09/17 DOA: 05/25/2020  PCP: Antony Contras, MD  Admit date: 05/25/2020 Discharge date: 05/27/2020  Admitted From: Home Disposition: Home   Recommendations for Outpatient Follow-up:  1. Follow up with PCP in 1-2 weeks 2. Monitor INR, ideally in next 2-3 days.  3. Please obtain CBC at follow up 4. Follow up with [facility] 5. Follow up with nephrology per routine  Home Health: None Equipment/Devices: None new Discharge Condition: Stable CODE STATUS: DNR Diet recommendation: Heart healthy  Brief/Interim Summary: Tiffany Tiffany [name] is an 87 y.o. female with a history of T2DM, stage IV CKD, diverticulosis, HTN, HLD, PAF on coumadin, CVA in 1990's, tachy-brady syndrome s/p PPM, hypothyroidism who recently had coumadin stopped by PCP Tiffany [name] to anemia (hgb 10.6 > 9.2) and +FOBT and subsequently presented to [facility] where anemia was noted to have worsened and was sent to the ED. Hgb 8.8, INR 1.1. IV PPI was started and GI consulted, performing EGD 3/30 which showed only gastritis without bleeding. CT abd/pelvis was recommended in lieu of colonoscopy and revealed only diverticulosis without inflammation or other bleeding source. Hemoglobin has stabilized with no further bleeding since restarting coumadin. IV iron is administered, and the patient has been cleared for discharge by hospitalist and GI.  Discharge Diagnoses:  Principal Problem:   Symptomatic anemia Active Problems:   DM type 2 (diabetes mellitus, type 2) (HCC)   Essential hypertension   Atrial fibrillation (HCC)   Tachycardia-bradycardia syndrome (HCC)   Pacemaker-[employer]tr[employer]   Hypothyroidism   GI bleed  Symptomatic anemia of CKD: No source of GI bleeding defined, though pt has history of diverticulosis and some constipation which could caused +FOBT in absence of gross bleeding. - Trend Hgb at follow up.  - Will give IV iron prior to discharge and  continues po iron at home.  - Giving PPI Tiffany [name] to gastritis (nonbleeding) at EGD.   GERD, gastritis:  - Continue PPI  Paroxysmal atrial fibrillation, tachycardia-bradycardia syndrome s/p PPM:  - Continue diltiazem - Restart coumadin. Risks and benefits of bridging anticoagulation were discussed and this will be avoided.   Stage IV CKD:  - Follow up with [facility] per routine.  - Avoid nephrotoxins  T2DM:  - Monitor  HTN:  - Continue diltiazem   HLD:  - Continue statin  Hypothyroidism:  - Continue synthroid  Discharge Instructions Discharge Instructions    Diet - low sodium heart healthy   Complete by: As directed    Discharge instructions   Complete by: As directed    You were evaluated for possible GI bleeding which was not found on EGD and no source noticed on CT of the abdomen/pelvis. You have been cleared for discharge by GI and have received a dose of IV iron prior to leaving. You will need to continue taking protonix once daily after discharge to help with stomach irritation (gastritis). This was sent to your pharmacy. Continue taking other medications, including coumadin, as you were. If you notice any bleeding, seek medical attention right away. Otherwise, follow up with your PCP in the next 1-2 weeks.   Increase activity slowly   Complete by: As directed      Allergies as of 05/27/2020      Reactions   Other Other (See Comments)   Nuts, Corn, popcorn - diverticulitis   Sitagliptin    Other reaction(s): Unknown      Medication List    STOP taking these medications   cephALEXin  500 MG capsule Commonly known as: Keflex     TAKE these medications   diltiazem 120 MG 24 hr capsule Commonly known as: TIAZAC Take 120 mg by mouth daily.   Fergon 240 (27 FE) MG tablet Generic drug: ferrous gluconate Take 240 mg by mouth in the morning and at bedtime.   furosemide 20 MG tablet Commonly known as: LASIX Take 20 mg by mouth daily.   glipiZIDE 5 MG  tablet Commonly known as: GLUCOTROL Take 5 mg by mouth daily before breakfast.   levothyroxine 50 MCG tablet Commonly known as: SYNTHROID Take 50 mcg by mouth daily before breakfast.   multivitamin with minerals tablet Take 1 tablet by mouth daily.   Ozempic (1 MG/DOSE) 4 MG/3ML Sopn Generic drug: Semaglutide (1 MG/DOSE) Inject 1 mg into the skin once a week.   pantoprazole 40 MG tablet Commonly known as: Protonix Take 1 tablet (40 mg total) by mouth daily.   potassium chloride SA 20 MEQ tablet Commonly known as: KLOR-CON Take 20 mEq by mouth daily.   rosuvastatin 10 MG tablet Commonly known as: CRESTOR Take 10 mg by mouth daily.   warfarin 2 MG tablet Commonly known as: COUMADIN Take 2 mg by mouth daily.       Follow-up Information    Antony Contras, MD. Schedule an appointment as soon as possible for a visit.   Specialty: Family Medicine Contact information: [place] [address] [phone]        G[facility], [facility] up.   Contact information: [address] [place] [address] [phone]              Allergies  Allergen Reactions  . Other Other (See Comments)    Nuts, Corn, popcorn - diverticulitis  . Sitagliptin     Other reaction(s): Unknown    Consultations:  GI  Procedures/Studies: CT ABDOMEN PELVIS WO CONTRAST  Result Date: 05/26/2020 CLINICAL DATA:  Gastrointestinal bleeding, anemia EXAM: CT ABDOMEN AND PELVIS WITHOUT CONTRAST TECHNIQUE: Multidetector CT imaging of the abdomen and pelvis was performed following the standard protocol without IV contrast. COMPARISON:  10/30/2016 FINDINGS: Lower chest: Bilateral lower lobe bronchiectasis with bronchial wall thickening with patchy consolidation, consistent with bronchitis and bibasilar bronchopneumonia versus atelectasis. No effusion or pneumothorax. Dense calcification of the mitral annulus again noted. Unenhanced CT was performed per  clinician order. Lack of IV contrast limits sensitivity and specificity, especially for evaluation of abdominal/pelvic solid viscera. Hepatobiliary: No focal liver abnormality is seen. Status post cholecystectomy. No biliary dilatation. Pancreas: Unremarkable. No pancreatic ductal dilatation or surrounding inflammatory changes. Spleen: Normal in size without focal abnormality. Adrenals/Urinary Tract: No urinary tract calculi or obstructive uropathy. Right renal cysts are identified, simple in appearance. The adrenals are unremarkable. Bladder is decompressed with a Foley catheter. Stomach/Bowel: No bowel obstruction or ileus. Diffuse diverticulosis of the distal colon without diverticulitis. Normal appendix right lower quadrant. No bowel wall thickening or inflammatory change. Vascular/Lymphatic: Stable atherosclerosis of the abdominal aorta. No pathologic adenopathy within the abdomen or pelvis. Reproductive: Status post hysterectomy. No adnexal masses. Other: No free fluid or free gas.  No abdominal wall hernia. Musculoskeletal: No acute or destructive bony lesions. Reconstructed images demonstrate no additional findings. IMPRESSION: 1. Bilateral lower lobe bronchiectasis with bronchial wall thickening and patchy lower lobe consolidation, favor bronchitis and mild bronchopneumonia. 2. Distal colonic diverticulosis without diverticulitis. 3.  Aortic Atherosclerosis (ICD10-I70.0). Electronically Signed   By: Randa Ngo M.D.   On:  05/26/2020 17:13     EGD 05/26/2020, Dr. Therisa Doyne:  Impression:  - Normal upper third of esophagus, middle third of esophagus and lower third of esophagus. - Widely patent Schatzki ring. - 2 cm hiatal hernia. - Bilious gastric fluid. - Erythematous mucosa in the stomach. - Normal examined duodenum. - No specimens  collected. - Rectal exam performed showed hard,pellet like  dark green stools, large amount of fecal  disimpaction performed. Recommendation: Renal diet today.  - Perform CT scan (computed tomography) of the abdomen and pelvis without contrast today.   Subjective: Feels eager to go home. No dyspnea, chest pain, bleeding, palpitations, leg swelling or bleeding.   Discharge Exam: Vitals:   05/26/20 2008 05/27/20 0432  BP: 123/62 136/66  Pulse: (!) 59 60  Resp: 18 18  Temp: 98.2 F (36.8 C) 98.3 F (36.8 C)  SpO2: 95% 94%   General: Pt is alert, awake, not in acute distress Cardiovascular: RRR, S1/S2 +, no rubs, no gallops Respiratory: CTA bilaterally, no wheezing, no rhonchi Abdominal: Soft, NT, ND, bowel sounds + Extremities: No edema, no cyanosis  Labs: BNP (last 3 results) No results for input(s): BNP in the last 8760 hours. Basic Metabolic Panel: Recent Labs  Lab 05/25/20 1325 05/26/20 0027 05/27/20 0318  NA 140 140 138  K 4.3 3.9 4.8  CL 108 109 105  CO2 24 20* 23  GLUCOSE 164* 77 149*  BUN 45* 37* 36*  CREATININE 1.89* 2.00* 2.22*  CALCIUM 9.2 8.7* 8.9   Liver Function Tests: Recent Labs  Lab 05/25/20 1325  AST 23  ALT 23  ALKPHOS 56  BILITOT 0.6  PROT 7.6  ALBUMIN 3.5   No results for input(s): LIPASE, AMYLASE in the last 168 hours. No results for input(s): AMMONIA in the last 168 hours. CBC: Recent Labs  Lab 05/25/20 1325 05/25/20 2047 05/26/20 0027 05/27/20 0318  WBC 5.9  --  6.5 8.9  HGB 8.8* 7.9* 7.9*  8.0* 8.4*  HCT 28.4* 25.5* 26.1*  25.9* 26.9*  MCV 89.0  --  90.6 88.5  PLT 192  --  180 182   Cardiac Enzymes: No results for input(s): CKTOTAL, CKMB, CKMBINDEX, TROPONINI in the last 168 hours. BNP: Invalid input(s): POCBNP CBG: No results for input(s): GLUCAP in the last 168 hours. D-Dimer No results for input(s): DDIMER in  the last 72 hours. Hgb A1c No results for input(s): HGBA1C in the last 72 hours. Lipid Profile No results for input(s): CHOL, HDL, LDLCALC, TRIG, CHOLHDL, LDLDIRECT in the last 72 hours. Thyroid function studies No results for input(s): TSH, T4TOTAL, T3FREE, THYROIDAB in the last 72 hours.  Invalid input(s): FREET3 Anemia work up Recent Labs    05/27/20 0318  FERRITIN 62  TIBC 334  IRON 33   Urinalysis    Component Value Date/Time   COLORURINE YELLOW 11/18/2017 1555   APPEARANCEUR HAZY (A) 11/18/2017 1555   LABSPEC 1.011 11/18/2017 1555   PHURINE 6.0 11/18/2017 1555   GLUCOSEU NEGATIVE 11/18/2017 1555   HGBUR SMALL (A) 11/18/2017 1555   BILIRUBINUR NEGATIVE 11/18/2017 1555   BILIRUBINUR small 09/20/2011 1037   KETONESUR NEGATIVE 11/18/2017 1555   PROTEINUR NEGATIVE 11/18/2017 1555   UROBILINOGEN 0.2 11/18/2014 1941   NITRITE NEGATIVE 11/18/2017 1555   LEUKOCYTESUR LARGE (A) 11/18/2017 1555    Microbiology Recent Results (from the past 240 hour(s))  Resp Panel by RT-PCR (Flu A&B, Covid) Nasopharyngeal Swab     Status: None   Collection Time: 05/25/20  2:29 PM  Specimen: Nasopharyngeal Swab; Nasopharyngeal(NP) swabs in vial transport medium  Result Value Ref Range Status   SARS Coronavirus 2 by RT PCR NEGATIVE NEGATIVE Final    Comment: (NOTE) SARS-CoV-2 target nucleic acids are NOT DETECTED.  The SARS-CoV-2 RNA is generally detectable in upper respiratory specimens during the acute phase of infection. The lowest concentration of SARS-CoV-2 viral copies this assay can detect is 138 copies/mL. A negative result does not preclude SARS-Cov-2 infection and should not be used as the sole basis for treatment or other patient management decisions. A negative result may occur with  improper specimen collection/handling, submission of specimen other than nasopharyngeal swab, presence of viral mutation(s) within the areas targeted by this assay, and inadequate number of  viral copies(<138 copies/mL). A negative result must be combined with clinical observations, patient history, and epidemiological information. The expected result is Negative.  Fact Sheet for Patients:  EntrepreneurPulse.com.au  Fact Sheet for Healthcare Providers:  IncredibleEmployment.be  This test is no t yet approved or cleared by the [place] FDA and  has been authorized for detection and/or diagnosis of SARS-CoV-2 by FDA under an Emergency Use Authorization (EUA). This EUA will remain  in effect (meaning this test can be used) for the duration of the COVID-19 declaration under Section 564(b)(1) of the Act, 21 U.S.C.section 360bbb-3(b)(1), unless the authorization is terminated  or revoked sooner.       Influenza A by PCR NEGATIVE NEGATIVE Final   Influenza B by PCR NEGATIVE NEGATIVE Final    Comment: (NOTE) The Xpert Xpress SARS-CoV-2/FLU/RSV plus assay is intended as an aid in the diagnosis of influenza from Nasopharyngeal swab specimens and should not be used as a sole basis for treatment. Nasal washings and aspirates are unacceptable for Xpert Xpress SARS-CoV-2/FLU/RSV testing.  Fact Sheet for Patients: EntrepreneurPulse.com.au  Fact Sheet for Healthcare Providers: IncredibleEmployment.be  This test is not yet approved or cleared by the [place] FDA and has been authorized for detection and/or diagnosis of SARS-CoV-2 by FDA under an Emergency Use Authorization (EUA). This EUA will remain in effect (meaning this test can be used) for the duration of the COVID-19 declaration under Section 564(b)(1) of the Act, 21 U.S.C. section 360bbb-3(b)(1), unless the authorization is terminated or revoked.  Performed at [facility], [place] [address], [place], [place] [address]     Time coordinating discharge: Approximately 40 minutes  Patrecia Pour, MD  [facility] 05/27/2020, 10:48 AM

## 2020-05-27 NOTE — Progress Notes (Signed)
ANTICOAGULATION CONSULT NOTE - follow up  Pharmacy Consult for warfarin Indication: atrial fibrillation  Allergies  Allergen Reactions  . Other Other (See Comments)    Nuts, Corn, popcorn - diverticulitis  . Sitagliptin     Other reaction(s): Unknown    Patient Measurements: Height: 5' 2.5" (158.8 cm) Weight: 69.4 kg (153 lb) IBW/kg (Calculated) : 51.25 Heparin Dosing Weight:   Vital Signs: Temp: 98.3 F (36.8 C) (03/31 0432) BP: 136/66 (03/31 0432) Pulse Rate: 60 (03/31 0432)  Labs: Recent Labs    05/25/20 1325 05/25/20 1344 05/25/20 2047 05/26/20 0027 05/27/20 0318  HGB 8.8*  --  7.9* 7.9*  8.0* 8.4*  HCT 28.4*  --  25.5* 26.1*  25.9* 26.9*  PLT 192  --   --  180 182  LABPROT  --  14.1  --  15.5* 15.0  INR  --  1.1  --  1.3* 1.2  CREATININE 1.89*  --   --  2.00* 2.22*    Estimated Creatinine Clearance: 16.8 mL/min (A) (by C-G formula based on SCr of 2.22 mg/dL (H)).   Medical History: Past Medical History:  Diagnosis Date  . Chronic kidney disease   . Diverticulosis   . DM2 (diabetes mellitus, type 2) ([place])   . Esophageal reflux   . HTN (hypertension)   . Hyperlipidemia   . Hypothyroidism   . Microalbuminuria    last done in 1/07   . Paroxysmal atrial fibrillation (HCC)    and atrial tachycardia  . Presence of permanent cardiac pacemaker   . Stroke ([place]) 1999  . Tachycardia-bradycardia syndrome ([place])    s/p PPM  . Unspecified hypothyroidism 11/20/2013    Medications:  Medications Prior to Admission  Medication Sig Dispense Refill Last Dose  . diltiazem (TIAZAC) 120 MG 24 hr capsule Take 120 mg by mouth daily.     . FERGON 240 (27 FE) MG tablet Take 240 mg by mouth in the morning and at bedtime.  3 05/25/2020 at Unknown time  . furosemide (LASIX) 20 MG tablet Take 20 mg by mouth daily.   05/25/2020 at Unknown time  . glipiZIDE (GLUCOTROL) 5 MG tablet Take 5 mg by mouth daily before breakfast.    05/24/2020 at Unknown time  . levothyroxine  (SYNTHROID, LEVOTHROID) 50 MCG tablet Take 50 mcg by mouth daily before breakfast.    05/25/2020 at Unknown time  . Multiple Vitamins-Minerals (MULTIVITAMIN WITH MINERALS) tablet Take 1 tablet by mouth daily.   05/25/2020 at Unknown time  . OZEMPIC, 1 MG/DOSE, 4 MG/3ML SOPN Inject 1 mg into the skin once a week.   05/24/2020  . potassium chloride SA (K-DUR,KLOR-CON) 20 MEQ tablet Take 20 mEq by mouth daily.   05/25/2020 at Unknown time  . rosuvastatin (CRESTOR) 10 MG tablet Take 10 mg by mouth daily.   05/25/2020 at Unknown time  . warfarin (COUMADIN) 2 MG tablet Take 2 mg by mouth daily.   05/18/2020 at 9 am  . cephALEXin (KEFLEX) 500 MG capsule Take 1 capsule (500 mg total) by mouth 4 (four) times daily. (Patient not taking: No sig reported) 28 capsule 0 Not Taking at Unknown time    Assessment: 87 yo F on warfarin PTA for Afib.  Pharmacy consulted to resume warfarin tonight.  Warfarin was held on admit for symptomatic anemia. No source of bleeding defined. Per [facility] OK to restart warfarin tonight per GI.  PTA dose warfarin 2 mg daily.     INR SUBtherapeutic after 3mg  warfarin x  1 as expected  CBC ok, stable  SCr 2.22, rising  No reported bleeding   Goal of Therapy:  INR 2-3 Monitor platelets by anticoagulation protocol: Yes   Plan:   Repeat warfarin 3 mg po x 1 dose today  Daily INR   Adrian Saran, PharmD, BCPS 05/27/2020 11:21 AM

## 2020-05-28 DIAGNOSIS — E1122 Type 2 diabetes mellitus with diabetic chronic kidney disease: Secondary | ICD-10-CM | POA: Diagnosis not present

## 2020-05-28 DIAGNOSIS — R339 Retention of urine, unspecified: Secondary | ICD-10-CM | POA: Diagnosis not present

## 2020-05-28 DIAGNOSIS — L89152 Pressure ulcer of sacral region, stage 2: Secondary | ICD-10-CM | POA: Diagnosis not present

## 2020-05-28 DIAGNOSIS — N3946 Mixed incontinence: Secondary | ICD-10-CM | POA: Diagnosis not present

## 2020-05-28 DIAGNOSIS — Z466 Encounter for fitting and adjustment of urinary device: Secondary | ICD-10-CM | POA: Diagnosis not present

## 2020-05-28 DIAGNOSIS — I480 Paroxysmal atrial fibrillation: Secondary | ICD-10-CM | POA: Diagnosis not present

## 2020-05-31 DIAGNOSIS — N3946 Mixed incontinence: Secondary | ICD-10-CM | POA: Diagnosis not present

## 2020-05-31 DIAGNOSIS — L89152 Pressure ulcer of sacral region, stage 2: Secondary | ICD-10-CM | POA: Diagnosis not present

## 2020-05-31 DIAGNOSIS — E1122 Type 2 diabetes mellitus with diabetic chronic kidney disease: Secondary | ICD-10-CM | POA: Diagnosis not present

## 2020-05-31 DIAGNOSIS — I480 Paroxysmal atrial fibrillation: Secondary | ICD-10-CM | POA: Diagnosis not present

## 2020-05-31 DIAGNOSIS — Z466 Encounter for fitting and adjustment of urinary device: Secondary | ICD-10-CM | POA: Diagnosis not present

## 2020-05-31 DIAGNOSIS — R339 Retention of urine, unspecified: Secondary | ICD-10-CM | POA: Diagnosis not present

## 2020-06-01 DIAGNOSIS — L89152 Pressure ulcer of sacral region, stage 2: Secondary | ICD-10-CM | POA: Diagnosis not present

## 2020-06-01 DIAGNOSIS — Z466 Encounter for fitting and adjustment of urinary device: Secondary | ICD-10-CM | POA: Diagnosis not present

## 2020-06-01 DIAGNOSIS — N3946 Mixed incontinence: Secondary | ICD-10-CM | POA: Diagnosis not present

## 2020-06-01 DIAGNOSIS — E1122 Type 2 diabetes mellitus with diabetic chronic kidney disease: Secondary | ICD-10-CM | POA: Diagnosis not present

## 2020-06-01 DIAGNOSIS — R339 Retention of urine, unspecified: Secondary | ICD-10-CM | POA: Diagnosis not present

## 2020-06-01 DIAGNOSIS — I480 Paroxysmal atrial fibrillation: Secondary | ICD-10-CM | POA: Diagnosis not present

## 2020-06-03 DIAGNOSIS — N184 Chronic kidney disease, stage 4 (severe): Secondary | ICD-10-CM | POA: Diagnosis not present

## 2020-06-03 DIAGNOSIS — I480 Paroxysmal atrial fibrillation: Secondary | ICD-10-CM | POA: Diagnosis not present

## 2020-06-03 DIAGNOSIS — R5381 Other malaise: Secondary | ICD-10-CM | POA: Diagnosis not present

## 2020-06-03 DIAGNOSIS — L89159 Pressure ulcer of sacral region, unspecified stage: Secondary | ICD-10-CM | POA: Diagnosis not present

## 2020-06-03 DIAGNOSIS — D649 Anemia, unspecified: Secondary | ICD-10-CM | POA: Diagnosis not present

## 2020-06-04 DIAGNOSIS — Z794 Long term (current) use of insulin: Secondary | ICD-10-CM | POA: Diagnosis not present

## 2020-06-04 DIAGNOSIS — Z7901 Long term (current) use of anticoagulants: Secondary | ICD-10-CM | POA: Diagnosis not present

## 2020-06-04 DIAGNOSIS — Z5181 Encounter for therapeutic drug level monitoring: Secondary | ICD-10-CM | POA: Diagnosis not present

## 2020-06-04 DIAGNOSIS — E1122 Type 2 diabetes mellitus with diabetic chronic kidney disease: Secondary | ICD-10-CM | POA: Diagnosis not present

## 2020-06-04 DIAGNOSIS — S30810D Abrasion of lower back and pelvis, subsequent encounter: Secondary | ICD-10-CM | POA: Diagnosis not present

## 2020-06-04 DIAGNOSIS — R339 Retention of urine, unspecified: Secondary | ICD-10-CM | POA: Diagnosis not present

## 2020-06-04 DIAGNOSIS — N3946 Mixed incontinence: Secondary | ICD-10-CM | POA: Diagnosis not present

## 2020-06-04 DIAGNOSIS — E1142 Type 2 diabetes mellitus with diabetic polyneuropathy: Secondary | ICD-10-CM | POA: Diagnosis not present

## 2020-06-04 DIAGNOSIS — M6281 Muscle weakness (generalized): Secondary | ICD-10-CM | POA: Diagnosis not present

## 2020-06-04 DIAGNOSIS — I129 Hypertensive chronic kidney disease with stage 1 through stage 4 chronic kidney disease, or unspecified chronic kidney disease: Secondary | ICD-10-CM | POA: Diagnosis not present

## 2020-06-04 DIAGNOSIS — I480 Paroxysmal atrial fibrillation: Secondary | ICD-10-CM | POA: Diagnosis not present

## 2020-06-04 DIAGNOSIS — Z466 Encounter for fitting and adjustment of urinary device: Secondary | ICD-10-CM | POA: Diagnosis not present

## 2020-06-04 DIAGNOSIS — L89322 Pressure ulcer of left buttock, stage 2: Secondary | ICD-10-CM | POA: Diagnosis not present

## 2020-06-09 DIAGNOSIS — L89322 Pressure ulcer of left buttock, stage 2: Secondary | ICD-10-CM | POA: Diagnosis not present

## 2020-06-09 DIAGNOSIS — N3946 Mixed incontinence: Secondary | ICD-10-CM | POA: Diagnosis not present

## 2020-06-09 DIAGNOSIS — R339 Retention of urine, unspecified: Secondary | ICD-10-CM | POA: Diagnosis not present

## 2020-06-09 DIAGNOSIS — E1122 Type 2 diabetes mellitus with diabetic chronic kidney disease: Secondary | ICD-10-CM | POA: Diagnosis not present

## 2020-06-09 DIAGNOSIS — I480 Paroxysmal atrial fibrillation: Secondary | ICD-10-CM | POA: Diagnosis not present

## 2020-06-09 DIAGNOSIS — Z466 Encounter for fitting and adjustment of urinary device: Secondary | ICD-10-CM | POA: Diagnosis not present

## 2020-06-11 DIAGNOSIS — L89322 Pressure ulcer of left buttock, stage 2: Secondary | ICD-10-CM | POA: Diagnosis not present

## 2020-06-11 DIAGNOSIS — N3946 Mixed incontinence: Secondary | ICD-10-CM | POA: Diagnosis not present

## 2020-06-11 DIAGNOSIS — Z466 Encounter for fitting and adjustment of urinary device: Secondary | ICD-10-CM | POA: Diagnosis not present

## 2020-06-11 DIAGNOSIS — E1122 Type 2 diabetes mellitus with diabetic chronic kidney disease: Secondary | ICD-10-CM | POA: Diagnosis not present

## 2020-06-11 DIAGNOSIS — R339 Retention of urine, unspecified: Secondary | ICD-10-CM | POA: Diagnosis not present

## 2020-06-11 DIAGNOSIS — I480 Paroxysmal atrial fibrillation: Secondary | ICD-10-CM | POA: Diagnosis not present

## 2020-06-15 DIAGNOSIS — E1122 Type 2 diabetes mellitus with diabetic chronic kidney disease: Secondary | ICD-10-CM | POA: Diagnosis not present

## 2020-06-15 DIAGNOSIS — Z466 Encounter for fitting and adjustment of urinary device: Secondary | ICD-10-CM | POA: Diagnosis not present

## 2020-06-15 DIAGNOSIS — I480 Paroxysmal atrial fibrillation: Secondary | ICD-10-CM | POA: Diagnosis not present

## 2020-06-15 DIAGNOSIS — L89322 Pressure ulcer of left buttock, stage 2: Secondary | ICD-10-CM | POA: Diagnosis not present

## 2020-06-15 DIAGNOSIS — R339 Retention of urine, unspecified: Secondary | ICD-10-CM | POA: Diagnosis not present

## 2020-06-15 DIAGNOSIS — N3946 Mixed incontinence: Secondary | ICD-10-CM | POA: Diagnosis not present

## 2020-06-16 DIAGNOSIS — L89322 Pressure ulcer of left buttock, stage 2: Secondary | ICD-10-CM | POA: Diagnosis not present

## 2020-06-16 DIAGNOSIS — I480 Paroxysmal atrial fibrillation: Secondary | ICD-10-CM | POA: Diagnosis not present

## 2020-06-16 DIAGNOSIS — E1122 Type 2 diabetes mellitus with diabetic chronic kidney disease: Secondary | ICD-10-CM | POA: Diagnosis not present

## 2020-06-16 DIAGNOSIS — R339 Retention of urine, unspecified: Secondary | ICD-10-CM | POA: Diagnosis not present

## 2020-06-16 DIAGNOSIS — N3946 Mixed incontinence: Secondary | ICD-10-CM | POA: Diagnosis not present

## 2020-06-16 DIAGNOSIS — Z466 Encounter for fitting and adjustment of urinary device: Secondary | ICD-10-CM | POA: Diagnosis not present

## 2020-06-21 DIAGNOSIS — I480 Paroxysmal atrial fibrillation: Secondary | ICD-10-CM | POA: Diagnosis not present

## 2020-06-21 DIAGNOSIS — L89322 Pressure ulcer of left buttock, stage 2: Secondary | ICD-10-CM | POA: Diagnosis not present

## 2020-06-21 DIAGNOSIS — R339 Retention of urine, unspecified: Secondary | ICD-10-CM | POA: Diagnosis not present

## 2020-06-21 DIAGNOSIS — Z466 Encounter for fitting and adjustment of urinary device: Secondary | ICD-10-CM | POA: Diagnosis not present

## 2020-06-21 DIAGNOSIS — N3946 Mixed incontinence: Secondary | ICD-10-CM | POA: Diagnosis not present

## 2020-06-21 DIAGNOSIS — E1122 Type 2 diabetes mellitus with diabetic chronic kidney disease: Secondary | ICD-10-CM | POA: Diagnosis not present

## 2020-06-22 ENCOUNTER — Ambulatory Visit (INDEPENDENT_AMBULATORY_CARE_PROVIDER_SITE_OTHER): Payer: Medicare Other

## 2020-06-22 DIAGNOSIS — I480 Paroxysmal atrial fibrillation: Secondary | ICD-10-CM | POA: Diagnosis not present

## 2020-06-22 DIAGNOSIS — R630 Anorexia: Secondary | ICD-10-CM | POA: Diagnosis not present

## 2020-06-22 DIAGNOSIS — N3946 Mixed incontinence: Secondary | ICD-10-CM | POA: Diagnosis not present

## 2020-06-22 DIAGNOSIS — I495 Sick sinus syndrome: Secondary | ICD-10-CM | POA: Diagnosis not present

## 2020-06-22 DIAGNOSIS — R140 Abdominal distension (gaseous): Secondary | ICD-10-CM | POA: Diagnosis not present

## 2020-06-22 DIAGNOSIS — I4891 Unspecified atrial fibrillation: Secondary | ICD-10-CM | POA: Diagnosis not present

## 2020-06-22 DIAGNOSIS — E1142 Type 2 diabetes mellitus with diabetic polyneuropathy: Secondary | ICD-10-CM | POA: Diagnosis not present

## 2020-06-22 DIAGNOSIS — Z466 Encounter for fitting and adjustment of urinary device: Secondary | ICD-10-CM | POA: Diagnosis not present

## 2020-06-22 DIAGNOSIS — N184 Chronic kidney disease, stage 4 (severe): Secondary | ICD-10-CM | POA: Diagnosis not present

## 2020-06-22 DIAGNOSIS — K219 Gastro-esophageal reflux disease without esophagitis: Secondary | ICD-10-CM | POA: Diagnosis not present

## 2020-06-22 DIAGNOSIS — E7800 Pure hypercholesterolemia, unspecified: Secondary | ICD-10-CM | POA: Diagnosis not present

## 2020-06-22 DIAGNOSIS — R339 Retention of urine, unspecified: Secondary | ICD-10-CM | POA: Diagnosis not present

## 2020-06-22 DIAGNOSIS — I10 Essential (primary) hypertension: Secondary | ICD-10-CM | POA: Diagnosis not present

## 2020-06-22 DIAGNOSIS — L89322 Pressure ulcer of left buttock, stage 2: Secondary | ICD-10-CM | POA: Diagnosis not present

## 2020-06-22 DIAGNOSIS — K5900 Constipation, unspecified: Secondary | ICD-10-CM | POA: Diagnosis not present

## 2020-06-22 DIAGNOSIS — D509 Iron deficiency anemia, unspecified: Secondary | ICD-10-CM | POA: Diagnosis not present

## 2020-06-22 DIAGNOSIS — E1122 Type 2 diabetes mellitus with diabetic chronic kidney disease: Secondary | ICD-10-CM | POA: Diagnosis not present

## 2020-06-22 DIAGNOSIS — E039 Hypothyroidism, unspecified: Secondary | ICD-10-CM | POA: Diagnosis not present

## 2020-06-22 DIAGNOSIS — R54 Age-related physical debility: Secondary | ICD-10-CM | POA: Diagnosis not present

## 2020-06-22 LAB — CUP PACEART REMOTE DEVICE CHECK
Battery Remaining Longevity: 95 mo
Battery Voltage: 3.09 V
Brady Statistic AP VP Percent: 99.75 %
Brady Statistic AP VS Percent: 0 %
Brady Statistic AS VP Percent: 0 %
Brady Statistic AS VS Percent: 0.25 %
Brady Statistic RA Percent Paced: 100 %
Brady Statistic RV Percent Paced: 99.75 %
Date Time Interrogation Session: 20220426052009
Implantable Lead Implant Date: 19990907
Implantable Lead Implant Date: 19990907
Implantable Lead Location: 753859
Implantable Lead Location: 753860
Implantable Lead Serial Number: 32904
Implantable Lead Serial Number: 33421
Implantable Pulse Generator Implant Date: 20211014
Lead Channel Impedance Value: 228 Ohm
Lead Channel Impedance Value: 228 Ohm
Lead Channel Impedance Value: 247 Ohm
Lead Channel Impedance Value: 266 Ohm
Lead Channel Pacing Threshold Amplitude: 1.125 V
Lead Channel Pacing Threshold Pulse Width: 0.4 ms
Lead Channel Setting Pacing Amplitude: 2 V
Lead Channel Setting Pacing Amplitude: 2.5 V
Lead Channel Setting Pacing Pulse Width: 0.4 ms
Lead Channel Setting Sensing Sensitivity: 8 mV

## 2020-06-28 DIAGNOSIS — Z466 Encounter for fitting and adjustment of urinary device: Secondary | ICD-10-CM | POA: Diagnosis not present

## 2020-06-28 DIAGNOSIS — E1122 Type 2 diabetes mellitus with diabetic chronic kidney disease: Secondary | ICD-10-CM | POA: Diagnosis not present

## 2020-06-28 DIAGNOSIS — I480 Paroxysmal atrial fibrillation: Secondary | ICD-10-CM | POA: Diagnosis not present

## 2020-06-28 DIAGNOSIS — N3946 Mixed incontinence: Secondary | ICD-10-CM | POA: Diagnosis not present

## 2020-06-28 DIAGNOSIS — L89322 Pressure ulcer of left buttock, stage 2: Secondary | ICD-10-CM | POA: Diagnosis not present

## 2020-06-28 DIAGNOSIS — R339 Retention of urine, unspecified: Secondary | ICD-10-CM | POA: Diagnosis not present

## 2020-06-29 DIAGNOSIS — R140 Abdominal distension (gaseous): Secondary | ICD-10-CM | POA: Diagnosis not present

## 2020-06-29 DIAGNOSIS — Z7189 Other specified counseling: Secondary | ICD-10-CM | POA: Diagnosis not present

## 2020-06-30 DIAGNOSIS — E1122 Type 2 diabetes mellitus with diabetic chronic kidney disease: Secondary | ICD-10-CM | POA: Diagnosis not present

## 2020-06-30 DIAGNOSIS — N3946 Mixed incontinence: Secondary | ICD-10-CM | POA: Diagnosis not present

## 2020-06-30 DIAGNOSIS — R339 Retention of urine, unspecified: Secondary | ICD-10-CM | POA: Diagnosis not present

## 2020-06-30 DIAGNOSIS — R350 Frequency of micturition: Secondary | ICD-10-CM | POA: Diagnosis not present

## 2020-06-30 DIAGNOSIS — L89322 Pressure ulcer of left buttock, stage 2: Secondary | ICD-10-CM | POA: Diagnosis not present

## 2020-06-30 DIAGNOSIS — I480 Paroxysmal atrial fibrillation: Secondary | ICD-10-CM | POA: Diagnosis not present

## 2020-06-30 DIAGNOSIS — Z466 Encounter for fitting and adjustment of urinary device: Secondary | ICD-10-CM | POA: Diagnosis not present

## 2020-07-04 DIAGNOSIS — Z7901 Long term (current) use of anticoagulants: Secondary | ICD-10-CM | POA: Diagnosis not present

## 2020-07-04 DIAGNOSIS — L89322 Pressure ulcer of left buttock, stage 2: Secondary | ICD-10-CM | POA: Diagnosis not present

## 2020-07-04 DIAGNOSIS — I129 Hypertensive chronic kidney disease with stage 1 through stage 4 chronic kidney disease, or unspecified chronic kidney disease: Secondary | ICD-10-CM | POA: Diagnosis not present

## 2020-07-04 DIAGNOSIS — S30810D Abrasion of lower back and pelvis, subsequent encounter: Secondary | ICD-10-CM | POA: Diagnosis not present

## 2020-07-04 DIAGNOSIS — Z5181 Encounter for therapeutic drug level monitoring: Secondary | ICD-10-CM | POA: Diagnosis not present

## 2020-07-04 DIAGNOSIS — Z794 Long term (current) use of insulin: Secondary | ICD-10-CM | POA: Diagnosis not present

## 2020-07-04 DIAGNOSIS — Z466 Encounter for fitting and adjustment of urinary device: Secondary | ICD-10-CM | POA: Diagnosis not present

## 2020-07-04 DIAGNOSIS — R339 Retention of urine, unspecified: Secondary | ICD-10-CM | POA: Diagnosis not present

## 2020-07-04 DIAGNOSIS — M6281 Muscle weakness (generalized): Secondary | ICD-10-CM | POA: Diagnosis not present

## 2020-07-04 DIAGNOSIS — E1142 Type 2 diabetes mellitus with diabetic polyneuropathy: Secondary | ICD-10-CM | POA: Diagnosis not present

## 2020-07-04 DIAGNOSIS — E1122 Type 2 diabetes mellitus with diabetic chronic kidney disease: Secondary | ICD-10-CM | POA: Diagnosis not present

## 2020-07-04 DIAGNOSIS — N3946 Mixed incontinence: Secondary | ICD-10-CM | POA: Diagnosis not present

## 2020-07-04 DIAGNOSIS — I480 Paroxysmal atrial fibrillation: Secondary | ICD-10-CM | POA: Diagnosis not present

## 2020-07-06 ENCOUNTER — Telehealth: Payer: Self-pay | Admitting: Internal Medicine

## 2020-07-06 NOTE — Telephone Encounter (Signed)
Notified that we received the fax.

## 2020-07-06 NOTE — Telephone Encounter (Signed)
Patient's daughter called and wanted to know if we received a fax for Dr. Rayann Heman. It was DNR paperwork; medical order for scope of treatment. Fax it was sent to was [phone]

## 2020-07-08 DIAGNOSIS — E1140 Type 2 diabetes mellitus with diabetic neuropathy, unspecified: Secondary | ICD-10-CM | POA: Diagnosis not present

## 2020-07-08 DIAGNOSIS — D500 Iron deficiency anemia secondary to blood loss (chronic): Secondary | ICD-10-CM | POA: Diagnosis not present

## 2020-07-08 DIAGNOSIS — I10 Essential (primary) hypertension: Secondary | ICD-10-CM | POA: Diagnosis not present

## 2020-07-08 DIAGNOSIS — K219 Gastro-esophageal reflux disease without esophagitis: Secondary | ICD-10-CM | POA: Diagnosis not present

## 2020-07-08 DIAGNOSIS — E039 Hypothyroidism, unspecified: Secondary | ICD-10-CM | POA: Diagnosis not present

## 2020-07-08 DIAGNOSIS — R54 Age-related physical debility: Secondary | ICD-10-CM | POA: Diagnosis not present

## 2020-07-08 DIAGNOSIS — N184 Chronic kidney disease, stage 4 (severe): Secondary | ICD-10-CM | POA: Diagnosis not present

## 2020-07-08 DIAGNOSIS — E7800 Pure hypercholesterolemia, unspecified: Secondary | ICD-10-CM | POA: Diagnosis not present

## 2020-07-08 DIAGNOSIS — I480 Paroxysmal atrial fibrillation: Secondary | ICD-10-CM | POA: Diagnosis not present

## 2020-07-12 NOTE — Progress Notes (Signed)
Remote pacemaker transmission.   

## 2020-07-14 DIAGNOSIS — R339 Retention of urine, unspecified: Secondary | ICD-10-CM | POA: Diagnosis not present

## 2020-07-14 DIAGNOSIS — I480 Paroxysmal atrial fibrillation: Secondary | ICD-10-CM | POA: Diagnosis not present

## 2020-07-14 DIAGNOSIS — Z466 Encounter for fitting and adjustment of urinary device: Secondary | ICD-10-CM | POA: Diagnosis not present

## 2020-07-14 DIAGNOSIS — N3946 Mixed incontinence: Secondary | ICD-10-CM | POA: Diagnosis not present

## 2020-07-14 DIAGNOSIS — E1122 Type 2 diabetes mellitus with diabetic chronic kidney disease: Secondary | ICD-10-CM | POA: Diagnosis not present

## 2020-07-14 DIAGNOSIS — L89322 Pressure ulcer of left buttock, stage 2: Secondary | ICD-10-CM | POA: Diagnosis not present

## 2020-07-21 DIAGNOSIS — Z466 Encounter for fitting and adjustment of urinary device: Secondary | ICD-10-CM | POA: Diagnosis not present

## 2020-07-21 DIAGNOSIS — E1122 Type 2 diabetes mellitus with diabetic chronic kidney disease: Secondary | ICD-10-CM | POA: Diagnosis not present

## 2020-07-21 DIAGNOSIS — N3946 Mixed incontinence: Secondary | ICD-10-CM | POA: Diagnosis not present

## 2020-07-21 DIAGNOSIS — L89322 Pressure ulcer of left buttock, stage 2: Secondary | ICD-10-CM | POA: Diagnosis not present

## 2020-07-21 DIAGNOSIS — I480 Paroxysmal atrial fibrillation: Secondary | ICD-10-CM | POA: Diagnosis not present

## 2020-07-21 DIAGNOSIS — R339 Retention of urine, unspecified: Secondary | ICD-10-CM | POA: Diagnosis not present

## 2020-07-29 DIAGNOSIS — E1122 Type 2 diabetes mellitus with diabetic chronic kidney disease: Secondary | ICD-10-CM | POA: Diagnosis not present

## 2020-07-29 DIAGNOSIS — L89322 Pressure ulcer of left buttock, stage 2: Secondary | ICD-10-CM | POA: Diagnosis not present

## 2020-07-29 DIAGNOSIS — R339 Retention of urine, unspecified: Secondary | ICD-10-CM | POA: Diagnosis not present

## 2020-07-29 DIAGNOSIS — Z466 Encounter for fitting and adjustment of urinary device: Secondary | ICD-10-CM | POA: Diagnosis not present

## 2020-07-29 DIAGNOSIS — N3946 Mixed incontinence: Secondary | ICD-10-CM | POA: Diagnosis not present

## 2020-07-29 DIAGNOSIS — I480 Paroxysmal atrial fibrillation: Secondary | ICD-10-CM | POA: Diagnosis not present

## 2020-08-02 DIAGNOSIS — E1142 Type 2 diabetes mellitus with diabetic polyneuropathy: Secondary | ICD-10-CM | POA: Diagnosis not present

## 2020-08-02 DIAGNOSIS — I480 Paroxysmal atrial fibrillation: Secondary | ICD-10-CM | POA: Diagnosis not present

## 2020-08-02 DIAGNOSIS — E1140 Type 2 diabetes mellitus with diabetic neuropathy, unspecified: Secondary | ICD-10-CM | POA: Diagnosis not present

## 2020-08-02 DIAGNOSIS — I10 Essential (primary) hypertension: Secondary | ICD-10-CM | POA: Diagnosis not present

## 2020-08-02 DIAGNOSIS — R54 Age-related physical debility: Secondary | ICD-10-CM | POA: Diagnosis not present

## 2020-08-02 DIAGNOSIS — D509 Iron deficiency anemia, unspecified: Secondary | ICD-10-CM | POA: Diagnosis not present

## 2020-08-02 DIAGNOSIS — E1122 Type 2 diabetes mellitus with diabetic chronic kidney disease: Secondary | ICD-10-CM | POA: Diagnosis not present

## 2020-08-02 DIAGNOSIS — K219 Gastro-esophageal reflux disease without esophagitis: Secondary | ICD-10-CM | POA: Diagnosis not present

## 2020-08-02 DIAGNOSIS — E7800 Pure hypercholesterolemia, unspecified: Secondary | ICD-10-CM | POA: Diagnosis not present

## 2020-08-02 DIAGNOSIS — E039 Hypothyroidism, unspecified: Secondary | ICD-10-CM | POA: Diagnosis not present

## 2020-08-02 DIAGNOSIS — N183 Chronic kidney disease, stage 3 unspecified: Secondary | ICD-10-CM | POA: Diagnosis not present

## 2020-08-02 DIAGNOSIS — I129 Hypertensive chronic kidney disease with stage 1 through stage 4 chronic kidney disease, or unspecified chronic kidney disease: Secondary | ICD-10-CM | POA: Diagnosis not present

## 2020-08-03 DIAGNOSIS — Z7901 Long term (current) use of anticoagulants: Secondary | ICD-10-CM | POA: Diagnosis not present

## 2020-08-03 DIAGNOSIS — E1122 Type 2 diabetes mellitus with diabetic chronic kidney disease: Secondary | ICD-10-CM | POA: Diagnosis not present

## 2020-08-03 DIAGNOSIS — Z7984 Long term (current) use of oral hypoglycemic drugs: Secondary | ICD-10-CM | POA: Diagnosis not present

## 2020-08-03 DIAGNOSIS — R339 Retention of urine, unspecified: Secondary | ICD-10-CM | POA: Diagnosis not present

## 2020-08-03 DIAGNOSIS — N3946 Mixed incontinence: Secondary | ICD-10-CM | POA: Diagnosis not present

## 2020-08-03 DIAGNOSIS — Z466 Encounter for fitting and adjustment of urinary device: Secondary | ICD-10-CM | POA: Diagnosis not present

## 2020-08-03 DIAGNOSIS — M6281 Muscle weakness (generalized): Secondary | ICD-10-CM | POA: Diagnosis not present

## 2020-08-03 DIAGNOSIS — Z5181 Encounter for therapeutic drug level monitoring: Secondary | ICD-10-CM | POA: Diagnosis not present

## 2020-08-03 DIAGNOSIS — I129 Hypertensive chronic kidney disease with stage 1 through stage 4 chronic kidney disease, or unspecified chronic kidney disease: Secondary | ICD-10-CM | POA: Diagnosis not present

## 2020-08-03 DIAGNOSIS — I480 Paroxysmal atrial fibrillation: Secondary | ICD-10-CM | POA: Diagnosis not present

## 2020-08-03 DIAGNOSIS — Z794 Long term (current) use of insulin: Secondary | ICD-10-CM | POA: Diagnosis not present

## 2020-08-03 DIAGNOSIS — E1142 Type 2 diabetes mellitus with diabetic polyneuropathy: Secondary | ICD-10-CM | POA: Diagnosis not present

## 2020-08-05 DIAGNOSIS — R339 Retention of urine, unspecified: Secondary | ICD-10-CM | POA: Diagnosis not present

## 2020-08-05 DIAGNOSIS — I480 Paroxysmal atrial fibrillation: Secondary | ICD-10-CM | POA: Diagnosis not present

## 2020-08-05 DIAGNOSIS — E1122 Type 2 diabetes mellitus with diabetic chronic kidney disease: Secondary | ICD-10-CM | POA: Diagnosis not present

## 2020-08-05 DIAGNOSIS — Z466 Encounter for fitting and adjustment of urinary device: Secondary | ICD-10-CM | POA: Diagnosis not present

## 2020-08-05 DIAGNOSIS — N3946 Mixed incontinence: Secondary | ICD-10-CM | POA: Diagnosis not present

## 2020-08-05 DIAGNOSIS — I129 Hypertensive chronic kidney disease with stage 1 through stage 4 chronic kidney disease, or unspecified chronic kidney disease: Secondary | ICD-10-CM | POA: Diagnosis not present

## 2020-08-11 DIAGNOSIS — E1122 Type 2 diabetes mellitus with diabetic chronic kidney disease: Secondary | ICD-10-CM | POA: Diagnosis not present

## 2020-08-11 DIAGNOSIS — I480 Paroxysmal atrial fibrillation: Secondary | ICD-10-CM | POA: Diagnosis not present

## 2020-08-11 DIAGNOSIS — Z466 Encounter for fitting and adjustment of urinary device: Secondary | ICD-10-CM | POA: Diagnosis not present

## 2020-08-11 DIAGNOSIS — I129 Hypertensive chronic kidney disease with stage 1 through stage 4 chronic kidney disease, or unspecified chronic kidney disease: Secondary | ICD-10-CM | POA: Diagnosis not present

## 2020-08-11 DIAGNOSIS — R339 Retention of urine, unspecified: Secondary | ICD-10-CM | POA: Diagnosis not present

## 2020-08-11 DIAGNOSIS — N3946 Mixed incontinence: Secondary | ICD-10-CM | POA: Diagnosis not present

## 2020-08-27 DIAGNOSIS — E1140 Type 2 diabetes mellitus with diabetic neuropathy, unspecified: Secondary | ICD-10-CM | POA: Diagnosis not present

## 2020-08-27 DIAGNOSIS — E7800 Pure hypercholesterolemia, unspecified: Secondary | ICD-10-CM | POA: Diagnosis not present

## 2020-08-27 DIAGNOSIS — E1122 Type 2 diabetes mellitus with diabetic chronic kidney disease: Secondary | ICD-10-CM | POA: Diagnosis not present

## 2020-08-27 DIAGNOSIS — E1142 Type 2 diabetes mellitus with diabetic polyneuropathy: Secondary | ICD-10-CM | POA: Diagnosis not present

## 2020-08-27 DIAGNOSIS — N184 Chronic kidney disease, stage 4 (severe): Secondary | ICD-10-CM | POA: Diagnosis not present

## 2020-08-27 DIAGNOSIS — E039 Hypothyroidism, unspecified: Secondary | ICD-10-CM | POA: Diagnosis not present

## 2020-08-27 DIAGNOSIS — D509 Iron deficiency anemia, unspecified: Secondary | ICD-10-CM | POA: Diagnosis not present

## 2020-08-27 DIAGNOSIS — K219 Gastro-esophageal reflux disease without esophagitis: Secondary | ICD-10-CM | POA: Diagnosis not present

## 2020-08-27 DIAGNOSIS — I4891 Unspecified atrial fibrillation: Secondary | ICD-10-CM | POA: Diagnosis not present

## 2020-08-27 DIAGNOSIS — R54 Age-related physical debility: Secondary | ICD-10-CM | POA: Diagnosis not present

## 2020-08-27 DIAGNOSIS — I10 Essential (primary) hypertension: Secondary | ICD-10-CM | POA: Diagnosis not present

## 2020-08-27 DIAGNOSIS — I129 Hypertensive chronic kidney disease with stage 1 through stage 4 chronic kidney disease, or unspecified chronic kidney disease: Secondary | ICD-10-CM | POA: Diagnosis not present

## 2020-08-31 DIAGNOSIS — Z466 Encounter for fitting and adjustment of urinary device: Secondary | ICD-10-CM | POA: Diagnosis not present

## 2020-08-31 DIAGNOSIS — E1122 Type 2 diabetes mellitus with diabetic chronic kidney disease: Secondary | ICD-10-CM | POA: Diagnosis not present

## 2020-08-31 DIAGNOSIS — N3946 Mixed incontinence: Secondary | ICD-10-CM | POA: Diagnosis not present

## 2020-08-31 DIAGNOSIS — I480 Paroxysmal atrial fibrillation: Secondary | ICD-10-CM | POA: Diagnosis not present

## 2020-08-31 DIAGNOSIS — I129 Hypertensive chronic kidney disease with stage 1 through stage 4 chronic kidney disease, or unspecified chronic kidney disease: Secondary | ICD-10-CM | POA: Diagnosis not present

## 2020-08-31 DIAGNOSIS — R339 Retention of urine, unspecified: Secondary | ICD-10-CM | POA: Diagnosis not present

## 2020-09-02 DIAGNOSIS — I129 Hypertensive chronic kidney disease with stage 1 through stage 4 chronic kidney disease, or unspecified chronic kidney disease: Secondary | ICD-10-CM | POA: Diagnosis not present

## 2020-09-02 DIAGNOSIS — Z7901 Long term (current) use of anticoagulants: Secondary | ICD-10-CM | POA: Diagnosis not present

## 2020-09-02 DIAGNOSIS — E1122 Type 2 diabetes mellitus with diabetic chronic kidney disease: Secondary | ICD-10-CM | POA: Diagnosis not present

## 2020-09-02 DIAGNOSIS — N3946 Mixed incontinence: Secondary | ICD-10-CM | POA: Diagnosis not present

## 2020-09-02 DIAGNOSIS — E1142 Type 2 diabetes mellitus with diabetic polyneuropathy: Secondary | ICD-10-CM | POA: Diagnosis not present

## 2020-09-02 DIAGNOSIS — Z5181 Encounter for therapeutic drug level monitoring: Secondary | ICD-10-CM | POA: Diagnosis not present

## 2020-09-02 DIAGNOSIS — Z794 Long term (current) use of insulin: Secondary | ICD-10-CM | POA: Diagnosis not present

## 2020-09-02 DIAGNOSIS — M6281 Muscle weakness (generalized): Secondary | ICD-10-CM | POA: Diagnosis not present

## 2020-09-02 DIAGNOSIS — Z7984 Long term (current) use of oral hypoglycemic drugs: Secondary | ICD-10-CM | POA: Diagnosis not present

## 2020-09-02 DIAGNOSIS — Z466 Encounter for fitting and adjustment of urinary device: Secondary | ICD-10-CM | POA: Diagnosis not present

## 2020-09-02 DIAGNOSIS — R339 Retention of urine, unspecified: Secondary | ICD-10-CM | POA: Diagnosis not present

## 2020-09-02 DIAGNOSIS — I480 Paroxysmal atrial fibrillation: Secondary | ICD-10-CM | POA: Diagnosis not present

## 2020-09-08 DIAGNOSIS — Z7901 Long term (current) use of anticoagulants: Secondary | ICD-10-CM | POA: Diagnosis not present

## 2020-09-08 DIAGNOSIS — N183 Chronic kidney disease, stage 3 unspecified: Secondary | ICD-10-CM | POA: Diagnosis not present

## 2020-09-08 DIAGNOSIS — R319 Hematuria, unspecified: Secondary | ICD-10-CM | POA: Diagnosis not present

## 2020-09-10 DIAGNOSIS — Z466 Encounter for fitting and adjustment of urinary device: Secondary | ICD-10-CM | POA: Diagnosis not present

## 2020-09-10 DIAGNOSIS — I480 Paroxysmal atrial fibrillation: Secondary | ICD-10-CM | POA: Diagnosis not present

## 2020-09-10 DIAGNOSIS — N3946 Mixed incontinence: Secondary | ICD-10-CM | POA: Diagnosis not present

## 2020-09-10 DIAGNOSIS — E1122 Type 2 diabetes mellitus with diabetic chronic kidney disease: Secondary | ICD-10-CM | POA: Diagnosis not present

## 2020-09-10 DIAGNOSIS — R339 Retention of urine, unspecified: Secondary | ICD-10-CM | POA: Diagnosis not present

## 2020-09-10 DIAGNOSIS — I129 Hypertensive chronic kidney disease with stage 1 through stage 4 chronic kidney disease, or unspecified chronic kidney disease: Secondary | ICD-10-CM | POA: Diagnosis not present

## 2020-09-21 ENCOUNTER — Ambulatory Visit (INDEPENDENT_AMBULATORY_CARE_PROVIDER_SITE_OTHER): Payer: Medicare Other

## 2020-09-21 DIAGNOSIS — I495 Sick sinus syndrome: Secondary | ICD-10-CM

## 2020-09-21 LAB — CUP PACEART REMOTE DEVICE CHECK
Battery Remaining Longevity: 92 mo
Battery Voltage: 3.02 V
Brady Statistic AP VP Percent: 99.99 %
Brady Statistic AP VS Percent: 0 %
Brady Statistic AS VP Percent: 0 %
Brady Statistic AS VS Percent: 0.01 %
Brady Statistic RA Percent Paced: 100 %
Brady Statistic RV Percent Paced: 99.99 %
Date Time Interrogation Session: 20220725222732
Implantable Lead Implant Date: 19990907
Implantable Lead Implant Date: 19990907
Implantable Lead Location: 753859
Implantable Lead Location: 753860
Implantable Lead Serial Number: 32904
Implantable Lead Serial Number: 33421
Implantable Pulse Generator Implant Date: 20211014
Lead Channel Impedance Value: 228 Ohm
Lead Channel Impedance Value: 228 Ohm
Lead Channel Impedance Value: 228 Ohm
Lead Channel Impedance Value: 247 Ohm
Lead Channel Pacing Threshold Amplitude: 1.125 V
Lead Channel Pacing Threshold Pulse Width: 0.4 ms
Lead Channel Sensing Intrinsic Amplitude: 14.5 mV
Lead Channel Sensing Intrinsic Amplitude: 14.5 mV
Lead Channel Setting Pacing Amplitude: 2 V
Lead Channel Setting Pacing Amplitude: 2.5 V
Lead Channel Setting Pacing Pulse Width: 0.4 ms
Lead Channel Setting Sensing Sensitivity: 8 mV

## 2020-09-28 DIAGNOSIS — I129 Hypertensive chronic kidney disease with stage 1 through stage 4 chronic kidney disease, or unspecified chronic kidney disease: Secondary | ICD-10-CM | POA: Diagnosis not present

## 2020-09-28 DIAGNOSIS — E1169 Type 2 diabetes mellitus with other specified complication: Secondary | ICD-10-CM | POA: Diagnosis not present

## 2020-09-28 DIAGNOSIS — R339 Retention of urine, unspecified: Secondary | ICD-10-CM | POA: Diagnosis not present

## 2020-09-28 DIAGNOSIS — Z85828 Personal history of other malignant neoplasm of skin: Secondary | ICD-10-CM | POA: Diagnosis not present

## 2020-09-28 DIAGNOSIS — E1122 Type 2 diabetes mellitus with diabetic chronic kidney disease: Secondary | ICD-10-CM | POA: Diagnosis not present

## 2020-09-28 DIAGNOSIS — Z7901 Long term (current) use of anticoagulants: Secondary | ICD-10-CM | POA: Diagnosis not present

## 2020-09-28 DIAGNOSIS — N183 Chronic kidney disease, stage 3 unspecified: Secondary | ICD-10-CM | POA: Diagnosis not present

## 2020-09-28 DIAGNOSIS — K219 Gastro-esophageal reflux disease without esophagitis: Secondary | ICD-10-CM | POA: Diagnosis not present

## 2020-09-28 DIAGNOSIS — Z466 Encounter for fitting and adjustment of urinary device: Secondary | ICD-10-CM | POA: Diagnosis not present

## 2020-09-28 DIAGNOSIS — D649 Anemia, unspecified: Secondary | ICD-10-CM | POA: Diagnosis not present

## 2020-09-28 DIAGNOSIS — R319 Hematuria, unspecified: Secondary | ICD-10-CM | POA: Diagnosis not present

## 2020-09-28 DIAGNOSIS — E7800 Pure hypercholesterolemia, unspecified: Secondary | ICD-10-CM | POA: Diagnosis not present

## 2020-09-28 DIAGNOSIS — R609 Edema, unspecified: Secondary | ICD-10-CM | POA: Diagnosis not present

## 2020-09-28 DIAGNOSIS — I480 Paroxysmal atrial fibrillation: Secondary | ICD-10-CM | POA: Diagnosis not present

## 2020-09-28 DIAGNOSIS — E039 Hypothyroidism, unspecified: Secondary | ICD-10-CM | POA: Diagnosis not present

## 2020-09-28 DIAGNOSIS — N3946 Mixed incontinence: Secondary | ICD-10-CM | POA: Diagnosis not present

## 2020-10-02 DIAGNOSIS — E1122 Type 2 diabetes mellitus with diabetic chronic kidney disease: Secondary | ICD-10-CM | POA: Diagnosis not present

## 2020-10-02 DIAGNOSIS — I480 Paroxysmal atrial fibrillation: Secondary | ICD-10-CM | POA: Diagnosis not present

## 2020-10-02 DIAGNOSIS — Z5181 Encounter for therapeutic drug level monitoring: Secondary | ICD-10-CM | POA: Diagnosis not present

## 2020-10-02 DIAGNOSIS — Z7901 Long term (current) use of anticoagulants: Secondary | ICD-10-CM | POA: Diagnosis not present

## 2020-10-02 DIAGNOSIS — I129 Hypertensive chronic kidney disease with stage 1 through stage 4 chronic kidney disease, or unspecified chronic kidney disease: Secondary | ICD-10-CM | POA: Diagnosis not present

## 2020-10-02 DIAGNOSIS — Z7984 Long term (current) use of oral hypoglycemic drugs: Secondary | ICD-10-CM | POA: Diagnosis not present

## 2020-10-02 DIAGNOSIS — R339 Retention of urine, unspecified: Secondary | ICD-10-CM | POA: Diagnosis not present

## 2020-10-02 DIAGNOSIS — Z466 Encounter for fitting and adjustment of urinary device: Secondary | ICD-10-CM | POA: Diagnosis not present

## 2020-10-02 DIAGNOSIS — E1142 Type 2 diabetes mellitus with diabetic polyneuropathy: Secondary | ICD-10-CM | POA: Diagnosis not present

## 2020-10-02 DIAGNOSIS — M6281 Muscle weakness (generalized): Secondary | ICD-10-CM | POA: Diagnosis not present

## 2020-10-02 DIAGNOSIS — Z794 Long term (current) use of insulin: Secondary | ICD-10-CM | POA: Diagnosis not present

## 2020-10-02 DIAGNOSIS — N3946 Mixed incontinence: Secondary | ICD-10-CM | POA: Diagnosis not present

## 2020-10-08 DIAGNOSIS — I480 Paroxysmal atrial fibrillation: Secondary | ICD-10-CM | POA: Diagnosis not present

## 2020-10-08 DIAGNOSIS — R339 Retention of urine, unspecified: Secondary | ICD-10-CM | POA: Diagnosis not present

## 2020-10-08 DIAGNOSIS — I129 Hypertensive chronic kidney disease with stage 1 through stage 4 chronic kidney disease, or unspecified chronic kidney disease: Secondary | ICD-10-CM | POA: Diagnosis not present

## 2020-10-08 DIAGNOSIS — N3946 Mixed incontinence: Secondary | ICD-10-CM | POA: Diagnosis not present

## 2020-10-08 DIAGNOSIS — Z466 Encounter for fitting and adjustment of urinary device: Secondary | ICD-10-CM | POA: Diagnosis not present

## 2020-10-08 DIAGNOSIS — E1122 Type 2 diabetes mellitus with diabetic chronic kidney disease: Secondary | ICD-10-CM | POA: Diagnosis not present

## 2020-10-13 DIAGNOSIS — N3946 Mixed incontinence: Secondary | ICD-10-CM | POA: Diagnosis not present

## 2020-10-13 DIAGNOSIS — I129 Hypertensive chronic kidney disease with stage 1 through stage 4 chronic kidney disease, or unspecified chronic kidney disease: Secondary | ICD-10-CM | POA: Diagnosis not present

## 2020-10-13 DIAGNOSIS — E1122 Type 2 diabetes mellitus with diabetic chronic kidney disease: Secondary | ICD-10-CM | POA: Diagnosis not present

## 2020-10-13 DIAGNOSIS — R339 Retention of urine, unspecified: Secondary | ICD-10-CM | POA: Diagnosis not present

## 2020-10-13 DIAGNOSIS — Z466 Encounter for fitting and adjustment of urinary device: Secondary | ICD-10-CM | POA: Diagnosis not present

## 2020-10-13 DIAGNOSIS — I480 Paroxysmal atrial fibrillation: Secondary | ICD-10-CM | POA: Diagnosis not present

## 2020-10-15 NOTE — Progress Notes (Signed)
Remote pacemaker transmission.   

## 2020-10-19 DIAGNOSIS — I4891 Unspecified atrial fibrillation: Secondary | ICD-10-CM | POA: Diagnosis not present

## 2020-10-19 DIAGNOSIS — I129 Hypertensive chronic kidney disease with stage 1 through stage 4 chronic kidney disease, or unspecified chronic kidney disease: Secondary | ICD-10-CM | POA: Diagnosis not present

## 2020-10-19 DIAGNOSIS — N184 Chronic kidney disease, stage 4 (severe): Secondary | ICD-10-CM | POA: Diagnosis not present

## 2020-10-19 DIAGNOSIS — E113291 Type 2 diabetes mellitus with mild nonproliferative diabetic retinopathy without macular edema, right eye: Secondary | ICD-10-CM | POA: Diagnosis not present

## 2020-10-19 DIAGNOSIS — E1140 Type 2 diabetes mellitus with diabetic neuropathy, unspecified: Secondary | ICD-10-CM | POA: Diagnosis not present

## 2020-10-19 DIAGNOSIS — E1122 Type 2 diabetes mellitus with diabetic chronic kidney disease: Secondary | ICD-10-CM | POA: Diagnosis not present

## 2020-10-19 DIAGNOSIS — E113212 Type 2 diabetes mellitus with mild nonproliferative diabetic retinopathy with macular edema, left eye: Secondary | ICD-10-CM | POA: Diagnosis not present

## 2020-10-19 DIAGNOSIS — E7800 Pure hypercholesterolemia, unspecified: Secondary | ICD-10-CM | POA: Diagnosis not present

## 2020-10-19 DIAGNOSIS — E039 Hypothyroidism, unspecified: Secondary | ICD-10-CM | POA: Diagnosis not present

## 2020-10-19 DIAGNOSIS — E1142 Type 2 diabetes mellitus with diabetic polyneuropathy: Secondary | ICD-10-CM | POA: Diagnosis not present

## 2020-10-19 DIAGNOSIS — K219 Gastro-esophageal reflux disease without esophagitis: Secondary | ICD-10-CM | POA: Diagnosis not present

## 2020-10-19 DIAGNOSIS — R54 Age-related physical debility: Secondary | ICD-10-CM | POA: Diagnosis not present

## 2020-10-19 DIAGNOSIS — I10 Essential (primary) hypertension: Secondary | ICD-10-CM | POA: Diagnosis not present

## 2020-10-19 DIAGNOSIS — D509 Iron deficiency anemia, unspecified: Secondary | ICD-10-CM | POA: Diagnosis not present

## 2020-10-28 DIAGNOSIS — R339 Retention of urine, unspecified: Secondary | ICD-10-CM | POA: Diagnosis not present

## 2020-10-28 DIAGNOSIS — I129 Hypertensive chronic kidney disease with stage 1 through stage 4 chronic kidney disease, or unspecified chronic kidney disease: Secondary | ICD-10-CM | POA: Diagnosis not present

## 2020-10-28 DIAGNOSIS — N3946 Mixed incontinence: Secondary | ICD-10-CM | POA: Diagnosis not present

## 2020-10-28 DIAGNOSIS — E1122 Type 2 diabetes mellitus with diabetic chronic kidney disease: Secondary | ICD-10-CM | POA: Diagnosis not present

## 2020-10-28 DIAGNOSIS — I480 Paroxysmal atrial fibrillation: Secondary | ICD-10-CM | POA: Diagnosis not present

## 2020-10-28 DIAGNOSIS — Z466 Encounter for fitting and adjustment of urinary device: Secondary | ICD-10-CM | POA: Diagnosis not present

## 2020-11-01 DIAGNOSIS — E1122 Type 2 diabetes mellitus with diabetic chronic kidney disease: Secondary | ICD-10-CM | POA: Diagnosis not present

## 2020-11-01 DIAGNOSIS — I129 Hypertensive chronic kidney disease with stage 1 through stage 4 chronic kidney disease, or unspecified chronic kidney disease: Secondary | ICD-10-CM | POA: Diagnosis not present

## 2020-11-01 DIAGNOSIS — N3946 Mixed incontinence: Secondary | ICD-10-CM | POA: Diagnosis not present

## 2020-11-01 DIAGNOSIS — I480 Paroxysmal atrial fibrillation: Secondary | ICD-10-CM | POA: Diagnosis not present

## 2020-11-01 DIAGNOSIS — Z794 Long term (current) use of insulin: Secondary | ICD-10-CM | POA: Diagnosis not present

## 2020-11-01 DIAGNOSIS — E1142 Type 2 diabetes mellitus with diabetic polyneuropathy: Secondary | ICD-10-CM | POA: Diagnosis not present

## 2020-11-01 DIAGNOSIS — Z7901 Long term (current) use of anticoagulants: Secondary | ICD-10-CM | POA: Diagnosis not present

## 2020-11-01 DIAGNOSIS — Z466 Encounter for fitting and adjustment of urinary device: Secondary | ICD-10-CM | POA: Diagnosis not present

## 2020-11-01 DIAGNOSIS — R339 Retention of urine, unspecified: Secondary | ICD-10-CM | POA: Diagnosis not present

## 2020-11-01 DIAGNOSIS — Z5181 Encounter for therapeutic drug level monitoring: Secondary | ICD-10-CM | POA: Diagnosis not present

## 2020-11-01 DIAGNOSIS — Z7984 Long term (current) use of oral hypoglycemic drugs: Secondary | ICD-10-CM | POA: Diagnosis not present

## 2020-11-01 DIAGNOSIS — M6281 Muscle weakness (generalized): Secondary | ICD-10-CM | POA: Diagnosis not present

## 2020-11-09 DIAGNOSIS — N3946 Mixed incontinence: Secondary | ICD-10-CM | POA: Diagnosis not present

## 2020-11-09 DIAGNOSIS — Z466 Encounter for fitting and adjustment of urinary device: Secondary | ICD-10-CM | POA: Diagnosis not present

## 2020-11-09 DIAGNOSIS — I129 Hypertensive chronic kidney disease with stage 1 through stage 4 chronic kidney disease, or unspecified chronic kidney disease: Secondary | ICD-10-CM | POA: Diagnosis not present

## 2020-11-09 DIAGNOSIS — I480 Paroxysmal atrial fibrillation: Secondary | ICD-10-CM | POA: Diagnosis not present

## 2020-11-09 DIAGNOSIS — E1122 Type 2 diabetes mellitus with diabetic chronic kidney disease: Secondary | ICD-10-CM | POA: Diagnosis not present

## 2020-11-09 DIAGNOSIS — R339 Retention of urine, unspecified: Secondary | ICD-10-CM | POA: Diagnosis not present

## 2020-11-15 DIAGNOSIS — N183 Chronic kidney disease, stage 3 unspecified: Secondary | ICD-10-CM | POA: Diagnosis not present

## 2020-11-15 DIAGNOSIS — N189 Chronic kidney disease, unspecified: Secondary | ICD-10-CM | POA: Diagnosis not present

## 2020-11-16 DIAGNOSIS — N390 Urinary tract infection, site not specified: Secondary | ICD-10-CM | POA: Diagnosis not present

## 2020-11-16 DIAGNOSIS — D631 Anemia in chronic kidney disease: Secondary | ICD-10-CM | POA: Diagnosis not present

## 2020-11-16 DIAGNOSIS — E673 Hypervitaminosis D: Secondary | ICD-10-CM | POA: Diagnosis not present

## 2020-11-16 DIAGNOSIS — I129 Hypertensive chronic kidney disease with stage 1 through stage 4 chronic kidney disease, or unspecified chronic kidney disease: Secondary | ICD-10-CM | POA: Diagnosis not present

## 2020-11-16 DIAGNOSIS — I4891 Unspecified atrial fibrillation: Secondary | ICD-10-CM | POA: Diagnosis not present

## 2020-11-16 DIAGNOSIS — N2581 Secondary hyperparathyroidism of renal origin: Secondary | ICD-10-CM | POA: Diagnosis not present

## 2020-11-16 DIAGNOSIS — N184 Chronic kidney disease, stage 4 (severe): Secondary | ICD-10-CM | POA: Diagnosis not present

## 2020-11-18 DIAGNOSIS — I10 Essential (primary) hypertension: Secondary | ICD-10-CM | POA: Diagnosis not present

## 2020-11-18 DIAGNOSIS — E1142 Type 2 diabetes mellitus with diabetic polyneuropathy: Secondary | ICD-10-CM | POA: Diagnosis not present

## 2020-11-18 DIAGNOSIS — E039 Hypothyroidism, unspecified: Secondary | ICD-10-CM | POA: Diagnosis not present

## 2020-11-18 DIAGNOSIS — R339 Retention of urine, unspecified: Secondary | ICD-10-CM | POA: Diagnosis not present

## 2020-11-18 DIAGNOSIS — R54 Age-related physical debility: Secondary | ICD-10-CM | POA: Diagnosis not present

## 2020-11-18 DIAGNOSIS — I129 Hypertensive chronic kidney disease with stage 1 through stage 4 chronic kidney disease, or unspecified chronic kidney disease: Secondary | ICD-10-CM | POA: Diagnosis not present

## 2020-11-18 DIAGNOSIS — E1122 Type 2 diabetes mellitus with diabetic chronic kidney disease: Secondary | ICD-10-CM | POA: Diagnosis not present

## 2020-11-18 DIAGNOSIS — E1140 Type 2 diabetes mellitus with diabetic neuropathy, unspecified: Secondary | ICD-10-CM | POA: Diagnosis not present

## 2020-11-18 DIAGNOSIS — E7800 Pure hypercholesterolemia, unspecified: Secondary | ICD-10-CM | POA: Diagnosis not present

## 2020-11-18 DIAGNOSIS — E1169 Type 2 diabetes mellitus with other specified complication: Secondary | ICD-10-CM | POA: Diagnosis not present

## 2020-11-18 DIAGNOSIS — K219 Gastro-esophageal reflux disease without esophagitis: Secondary | ICD-10-CM | POA: Diagnosis not present

## 2020-11-18 DIAGNOSIS — I480 Paroxysmal atrial fibrillation: Secondary | ICD-10-CM | POA: Diagnosis not present

## 2020-11-18 DIAGNOSIS — R310 Gross hematuria: Secondary | ICD-10-CM | POA: Diagnosis not present

## 2020-11-18 DIAGNOSIS — N183 Chronic kidney disease, stage 3 unspecified: Secondary | ICD-10-CM | POA: Diagnosis not present

## 2020-11-29 DIAGNOSIS — I480 Paroxysmal atrial fibrillation: Secondary | ICD-10-CM | POA: Diagnosis not present

## 2020-11-29 DIAGNOSIS — R339 Retention of urine, unspecified: Secondary | ICD-10-CM | POA: Diagnosis not present

## 2020-11-29 DIAGNOSIS — I129 Hypertensive chronic kidney disease with stage 1 through stage 4 chronic kidney disease, or unspecified chronic kidney disease: Secondary | ICD-10-CM | POA: Diagnosis not present

## 2020-11-29 DIAGNOSIS — N3946 Mixed incontinence: Secondary | ICD-10-CM | POA: Diagnosis not present

## 2020-11-29 DIAGNOSIS — Z466 Encounter for fitting and adjustment of urinary device: Secondary | ICD-10-CM | POA: Diagnosis not present

## 2020-11-29 DIAGNOSIS — E1122 Type 2 diabetes mellitus with diabetic chronic kidney disease: Secondary | ICD-10-CM | POA: Diagnosis not present

## 2020-12-01 DIAGNOSIS — Z7901 Long term (current) use of anticoagulants: Secondary | ICD-10-CM | POA: Diagnosis not present

## 2020-12-01 DIAGNOSIS — E1122 Type 2 diabetes mellitus with diabetic chronic kidney disease: Secondary | ICD-10-CM | POA: Diagnosis not present

## 2020-12-01 DIAGNOSIS — I480 Paroxysmal atrial fibrillation: Secondary | ICD-10-CM | POA: Diagnosis not present

## 2020-12-01 DIAGNOSIS — Z794 Long term (current) use of insulin: Secondary | ICD-10-CM | POA: Diagnosis not present

## 2020-12-01 DIAGNOSIS — E1142 Type 2 diabetes mellitus with diabetic polyneuropathy: Secondary | ICD-10-CM | POA: Diagnosis not present

## 2020-12-01 DIAGNOSIS — R339 Retention of urine, unspecified: Secondary | ICD-10-CM | POA: Diagnosis not present

## 2020-12-01 DIAGNOSIS — Z466 Encounter for fitting and adjustment of urinary device: Secondary | ICD-10-CM | POA: Diagnosis not present

## 2020-12-01 DIAGNOSIS — N3946 Mixed incontinence: Secondary | ICD-10-CM | POA: Diagnosis not present

## 2020-12-01 DIAGNOSIS — M6281 Muscle weakness (generalized): Secondary | ICD-10-CM | POA: Diagnosis not present

## 2020-12-01 DIAGNOSIS — Z7984 Long term (current) use of oral hypoglycemic drugs: Secondary | ICD-10-CM | POA: Diagnosis not present

## 2020-12-01 DIAGNOSIS — Z5181 Encounter for therapeutic drug level monitoring: Secondary | ICD-10-CM | POA: Diagnosis not present

## 2020-12-01 DIAGNOSIS — I129 Hypertensive chronic kidney disease with stage 1 through stage 4 chronic kidney disease, or unspecified chronic kidney disease: Secondary | ICD-10-CM | POA: Diagnosis not present

## 2020-12-01 DIAGNOSIS — K219 Gastro-esophageal reflux disease without esophagitis: Secondary | ICD-10-CM | POA: Diagnosis not present

## 2020-12-02 DIAGNOSIS — R310 Gross hematuria: Secondary | ICD-10-CM | POA: Diagnosis not present

## 2020-12-02 DIAGNOSIS — N281 Cyst of kidney, acquired: Secondary | ICD-10-CM | POA: Diagnosis not present

## 2020-12-02 DIAGNOSIS — K5730 Diverticulosis of large intestine without perforation or abscess without bleeding: Secondary | ICD-10-CM | POA: Diagnosis not present

## 2020-12-02 DIAGNOSIS — R634 Abnormal weight loss: Secondary | ICD-10-CM | POA: Diagnosis not present

## 2020-12-03 DIAGNOSIS — E039 Hypothyroidism, unspecified: Secondary | ICD-10-CM | POA: Diagnosis not present

## 2020-12-03 DIAGNOSIS — R42 Dizziness and giddiness: Secondary | ICD-10-CM | POA: Diagnosis not present

## 2020-12-03 DIAGNOSIS — D509 Iron deficiency anemia, unspecified: Secondary | ICD-10-CM | POA: Diagnosis not present

## 2020-12-03 DIAGNOSIS — Z87440 Personal history of urinary (tract) infections: Secondary | ICD-10-CM | POA: Diagnosis not present

## 2020-12-10 DIAGNOSIS — Z466 Encounter for fitting and adjustment of urinary device: Secondary | ICD-10-CM | POA: Diagnosis not present

## 2020-12-10 DIAGNOSIS — I480 Paroxysmal atrial fibrillation: Secondary | ICD-10-CM | POA: Diagnosis not present

## 2020-12-10 DIAGNOSIS — R339 Retention of urine, unspecified: Secondary | ICD-10-CM | POA: Diagnosis not present

## 2020-12-10 DIAGNOSIS — N3946 Mixed incontinence: Secondary | ICD-10-CM | POA: Diagnosis not present

## 2020-12-10 DIAGNOSIS — E1122 Type 2 diabetes mellitus with diabetic chronic kidney disease: Secondary | ICD-10-CM | POA: Diagnosis not present

## 2020-12-10 DIAGNOSIS — I129 Hypertensive chronic kidney disease with stage 1 through stage 4 chronic kidney disease, or unspecified chronic kidney disease: Secondary | ICD-10-CM | POA: Diagnosis not present

## 2020-12-15 DIAGNOSIS — R310 Gross hematuria: Secondary | ICD-10-CM | POA: Diagnosis not present

## 2020-12-15 DIAGNOSIS — N3946 Mixed incontinence: Secondary | ICD-10-CM | POA: Diagnosis not present

## 2020-12-15 DIAGNOSIS — R339 Retention of urine, unspecified: Secondary | ICD-10-CM | POA: Diagnosis not present

## 2020-12-15 DIAGNOSIS — E1122 Type 2 diabetes mellitus with diabetic chronic kidney disease: Secondary | ICD-10-CM | POA: Diagnosis not present

## 2020-12-15 DIAGNOSIS — I480 Paroxysmal atrial fibrillation: Secondary | ICD-10-CM | POA: Diagnosis not present

## 2020-12-15 DIAGNOSIS — I129 Hypertensive chronic kidney disease with stage 1 through stage 4 chronic kidney disease, or unspecified chronic kidney disease: Secondary | ICD-10-CM | POA: Diagnosis not present

## 2020-12-15 DIAGNOSIS — Z466 Encounter for fitting and adjustment of urinary device: Secondary | ICD-10-CM | POA: Diagnosis not present

## 2020-12-15 DIAGNOSIS — N390 Urinary tract infection, site not specified: Secondary | ICD-10-CM | POA: Diagnosis not present

## 2020-12-15 DIAGNOSIS — N3000 Acute cystitis without hematuria: Secondary | ICD-10-CM | POA: Diagnosis not present

## 2020-12-16 DIAGNOSIS — Z466 Encounter for fitting and adjustment of urinary device: Secondary | ICD-10-CM | POA: Diagnosis not present

## 2020-12-16 DIAGNOSIS — I480 Paroxysmal atrial fibrillation: Secondary | ICD-10-CM | POA: Diagnosis not present

## 2020-12-16 DIAGNOSIS — I129 Hypertensive chronic kidney disease with stage 1 through stage 4 chronic kidney disease, or unspecified chronic kidney disease: Secondary | ICD-10-CM | POA: Diagnosis not present

## 2020-12-16 DIAGNOSIS — N3946 Mixed incontinence: Secondary | ICD-10-CM | POA: Diagnosis not present

## 2020-12-16 DIAGNOSIS — E1122 Type 2 diabetes mellitus with diabetic chronic kidney disease: Secondary | ICD-10-CM | POA: Diagnosis not present

## 2020-12-16 DIAGNOSIS — R339 Retention of urine, unspecified: Secondary | ICD-10-CM | POA: Diagnosis not present

## 2020-12-17 DIAGNOSIS — I129 Hypertensive chronic kidney disease with stage 1 through stage 4 chronic kidney disease, or unspecified chronic kidney disease: Secondary | ICD-10-CM | POA: Diagnosis not present

## 2020-12-17 DIAGNOSIS — E1142 Type 2 diabetes mellitus with diabetic polyneuropathy: Secondary | ICD-10-CM | POA: Diagnosis not present

## 2020-12-17 DIAGNOSIS — I480 Paroxysmal atrial fibrillation: Secondary | ICD-10-CM | POA: Diagnosis not present

## 2020-12-17 DIAGNOSIS — N183 Chronic kidney disease, stage 3 unspecified: Secondary | ICD-10-CM | POA: Diagnosis not present

## 2020-12-17 DIAGNOSIS — E039 Hypothyroidism, unspecified: Secondary | ICD-10-CM | POA: Diagnosis not present

## 2020-12-17 DIAGNOSIS — E1122 Type 2 diabetes mellitus with diabetic chronic kidney disease: Secondary | ICD-10-CM | POA: Diagnosis not present

## 2020-12-17 DIAGNOSIS — I10 Essential (primary) hypertension: Secondary | ICD-10-CM | POA: Diagnosis not present

## 2020-12-17 DIAGNOSIS — R339 Retention of urine, unspecified: Secondary | ICD-10-CM | POA: Diagnosis not present

## 2020-12-17 DIAGNOSIS — N3946 Mixed incontinence: Secondary | ICD-10-CM | POA: Diagnosis not present

## 2020-12-17 DIAGNOSIS — E1169 Type 2 diabetes mellitus with other specified complication: Secondary | ICD-10-CM | POA: Diagnosis not present

## 2020-12-17 DIAGNOSIS — K219 Gastro-esophageal reflux disease without esophagitis: Secondary | ICD-10-CM | POA: Diagnosis not present

## 2020-12-17 DIAGNOSIS — R54 Age-related physical debility: Secondary | ICD-10-CM | POA: Diagnosis not present

## 2020-12-17 DIAGNOSIS — E1140 Type 2 diabetes mellitus with diabetic neuropathy, unspecified: Secondary | ICD-10-CM | POA: Diagnosis not present

## 2020-12-17 DIAGNOSIS — Z466 Encounter for fitting and adjustment of urinary device: Secondary | ICD-10-CM | POA: Diagnosis not present

## 2020-12-17 DIAGNOSIS — E7800 Pure hypercholesterolemia, unspecified: Secondary | ICD-10-CM | POA: Diagnosis not present

## 2020-12-20 DIAGNOSIS — E1122 Type 2 diabetes mellitus with diabetic chronic kidney disease: Secondary | ICD-10-CM | POA: Diagnosis not present

## 2020-12-20 DIAGNOSIS — Z466 Encounter for fitting and adjustment of urinary device: Secondary | ICD-10-CM | POA: Diagnosis not present

## 2020-12-20 DIAGNOSIS — N3946 Mixed incontinence: Secondary | ICD-10-CM | POA: Diagnosis not present

## 2020-12-20 DIAGNOSIS — I129 Hypertensive chronic kidney disease with stage 1 through stage 4 chronic kidney disease, or unspecified chronic kidney disease: Secondary | ICD-10-CM | POA: Diagnosis not present

## 2020-12-20 DIAGNOSIS — I480 Paroxysmal atrial fibrillation: Secondary | ICD-10-CM | POA: Diagnosis not present

## 2020-12-20 DIAGNOSIS — R339 Retention of urine, unspecified: Secondary | ICD-10-CM | POA: Diagnosis not present

## 2020-12-21 ENCOUNTER — Ambulatory Visit (INDEPENDENT_AMBULATORY_CARE_PROVIDER_SITE_OTHER): Payer: Medicare Other

## 2020-12-21 DIAGNOSIS — I480 Paroxysmal atrial fibrillation: Secondary | ICD-10-CM | POA: Diagnosis not present

## 2020-12-21 DIAGNOSIS — R339 Retention of urine, unspecified: Secondary | ICD-10-CM | POA: Diagnosis not present

## 2020-12-21 DIAGNOSIS — N3946 Mixed incontinence: Secondary | ICD-10-CM | POA: Diagnosis not present

## 2020-12-21 DIAGNOSIS — Z466 Encounter for fitting and adjustment of urinary device: Secondary | ICD-10-CM | POA: Diagnosis not present

## 2020-12-21 DIAGNOSIS — I495 Sick sinus syndrome: Secondary | ICD-10-CM

## 2020-12-21 DIAGNOSIS — I129 Hypertensive chronic kidney disease with stage 1 through stage 4 chronic kidney disease, or unspecified chronic kidney disease: Secondary | ICD-10-CM | POA: Diagnosis not present

## 2020-12-21 DIAGNOSIS — E1122 Type 2 diabetes mellitus with diabetic chronic kidney disease: Secondary | ICD-10-CM | POA: Diagnosis not present

## 2020-12-21 LAB — CUP PACEART REMOTE DEVICE CHECK
Battery Remaining Longevity: 89 mo
Battery Voltage: 3 V
Brady Statistic AP VP Percent: 99.99 %
Brady Statistic AP VS Percent: 0 %
Brady Statistic AS VP Percent: 0 %
Brady Statistic AS VS Percent: 0.01 %
Brady Statistic RA Percent Paced: 100 %
Brady Statistic RV Percent Paced: 99.99 %
Date Time Interrogation Session: 20221024221940
Implantable Lead Implant Date: 19990907
Implantable Lead Implant Date: 19990907
Implantable Lead Location: 753859
Implantable Lead Location: 753860
Implantable Lead Serial Number: 32904
Implantable Lead Serial Number: 33421
Implantable Pulse Generator Implant Date: 20211014
Lead Channel Impedance Value: 228 Ohm
Lead Channel Impedance Value: 228 Ohm
Lead Channel Impedance Value: 228 Ohm
Lead Channel Impedance Value: 247 Ohm
Lead Channel Pacing Threshold Amplitude: 1.125 V
Lead Channel Pacing Threshold Pulse Width: 0.4 ms
Lead Channel Sensing Intrinsic Amplitude: 11.5 mV
Lead Channel Sensing Intrinsic Amplitude: 11.5 mV
Lead Channel Setting Pacing Amplitude: 2 V
Lead Channel Setting Pacing Amplitude: 2.5 V
Lead Channel Setting Pacing Pulse Width: 0.4 ms
Lead Channel Setting Sensing Sensitivity: 8 mV

## 2020-12-22 DIAGNOSIS — E1122 Type 2 diabetes mellitus with diabetic chronic kidney disease: Secondary | ICD-10-CM | POA: Diagnosis not present

## 2020-12-22 DIAGNOSIS — R339 Retention of urine, unspecified: Secondary | ICD-10-CM | POA: Diagnosis not present

## 2020-12-22 DIAGNOSIS — I129 Hypertensive chronic kidney disease with stage 1 through stage 4 chronic kidney disease, or unspecified chronic kidney disease: Secondary | ICD-10-CM | POA: Diagnosis not present

## 2020-12-22 DIAGNOSIS — I480 Paroxysmal atrial fibrillation: Secondary | ICD-10-CM | POA: Diagnosis not present

## 2020-12-22 DIAGNOSIS — N3946 Mixed incontinence: Secondary | ICD-10-CM | POA: Diagnosis not present

## 2020-12-22 DIAGNOSIS — Z466 Encounter for fitting and adjustment of urinary device: Secondary | ICD-10-CM | POA: Diagnosis not present

## 2020-12-27 DIAGNOSIS — N3946 Mixed incontinence: Secondary | ICD-10-CM | POA: Diagnosis not present

## 2020-12-27 DIAGNOSIS — I129 Hypertensive chronic kidney disease with stage 1 through stage 4 chronic kidney disease, or unspecified chronic kidney disease: Secondary | ICD-10-CM | POA: Diagnosis not present

## 2020-12-27 DIAGNOSIS — R339 Retention of urine, unspecified: Secondary | ICD-10-CM | POA: Diagnosis not present

## 2020-12-27 DIAGNOSIS — E1122 Type 2 diabetes mellitus with diabetic chronic kidney disease: Secondary | ICD-10-CM | POA: Diagnosis not present

## 2020-12-27 DIAGNOSIS — Z466 Encounter for fitting and adjustment of urinary device: Secondary | ICD-10-CM | POA: Diagnosis not present

## 2020-12-27 DIAGNOSIS — I480 Paroxysmal atrial fibrillation: Secondary | ICD-10-CM | POA: Diagnosis not present

## 2020-12-28 DIAGNOSIS — I480 Paroxysmal atrial fibrillation: Secondary | ICD-10-CM | POA: Diagnosis not present

## 2020-12-28 DIAGNOSIS — N3946 Mixed incontinence: Secondary | ICD-10-CM | POA: Diagnosis not present

## 2020-12-28 DIAGNOSIS — I129 Hypertensive chronic kidney disease with stage 1 through stage 4 chronic kidney disease, or unspecified chronic kidney disease: Secondary | ICD-10-CM | POA: Diagnosis not present

## 2020-12-28 DIAGNOSIS — E1122 Type 2 diabetes mellitus with diabetic chronic kidney disease: Secondary | ICD-10-CM | POA: Diagnosis not present

## 2020-12-28 DIAGNOSIS — R339 Retention of urine, unspecified: Secondary | ICD-10-CM | POA: Diagnosis not present

## 2020-12-28 DIAGNOSIS — Z466 Encounter for fitting and adjustment of urinary device: Secondary | ICD-10-CM | POA: Diagnosis not present

## 2020-12-29 DIAGNOSIS — E1122 Type 2 diabetes mellitus with diabetic chronic kidney disease: Secondary | ICD-10-CM | POA: Diagnosis not present

## 2020-12-29 DIAGNOSIS — R339 Retention of urine, unspecified: Secondary | ICD-10-CM | POA: Diagnosis not present

## 2020-12-29 DIAGNOSIS — I129 Hypertensive chronic kidney disease with stage 1 through stage 4 chronic kidney disease, or unspecified chronic kidney disease: Secondary | ICD-10-CM | POA: Diagnosis not present

## 2020-12-29 DIAGNOSIS — I480 Paroxysmal atrial fibrillation: Secondary | ICD-10-CM | POA: Diagnosis not present

## 2020-12-29 DIAGNOSIS — Z466 Encounter for fitting and adjustment of urinary device: Secondary | ICD-10-CM | POA: Diagnosis not present

## 2020-12-29 DIAGNOSIS — N3946 Mixed incontinence: Secondary | ICD-10-CM | POA: Diagnosis not present

## 2020-12-29 NOTE — Progress Notes (Signed)
Remote pacemaker transmission.   

## 2020-12-31 DIAGNOSIS — R339 Retention of urine, unspecified: Secondary | ICD-10-CM | POA: Diagnosis not present

## 2020-12-31 DIAGNOSIS — I129 Hypertensive chronic kidney disease with stage 1 through stage 4 chronic kidney disease, or unspecified chronic kidney disease: Secondary | ICD-10-CM | POA: Diagnosis not present

## 2020-12-31 DIAGNOSIS — M6281 Muscle weakness (generalized): Secondary | ICD-10-CM | POA: Diagnosis not present

## 2020-12-31 DIAGNOSIS — K219 Gastro-esophageal reflux disease without esophagitis: Secondary | ICD-10-CM | POA: Diagnosis not present

## 2020-12-31 DIAGNOSIS — E1122 Type 2 diabetes mellitus with diabetic chronic kidney disease: Secondary | ICD-10-CM | POA: Diagnosis not present

## 2020-12-31 DIAGNOSIS — Z466 Encounter for fitting and adjustment of urinary device: Secondary | ICD-10-CM | POA: Diagnosis not present

## 2020-12-31 DIAGNOSIS — N3946 Mixed incontinence: Secondary | ICD-10-CM | POA: Diagnosis not present

## 2020-12-31 DIAGNOSIS — Z7901 Long term (current) use of anticoagulants: Secondary | ICD-10-CM | POA: Diagnosis not present

## 2020-12-31 DIAGNOSIS — E1142 Type 2 diabetes mellitus with diabetic polyneuropathy: Secondary | ICD-10-CM | POA: Diagnosis not present

## 2020-12-31 DIAGNOSIS — I480 Paroxysmal atrial fibrillation: Secondary | ICD-10-CM | POA: Diagnosis not present

## 2020-12-31 DIAGNOSIS — Z7984 Long term (current) use of oral hypoglycemic drugs: Secondary | ICD-10-CM | POA: Diagnosis not present

## 2020-12-31 DIAGNOSIS — Z794 Long term (current) use of insulin: Secondary | ICD-10-CM | POA: Diagnosis not present

## 2020-12-31 DIAGNOSIS — Z5181 Encounter for therapeutic drug level monitoring: Secondary | ICD-10-CM | POA: Diagnosis not present

## 2021-01-04 DIAGNOSIS — I129 Hypertensive chronic kidney disease with stage 1 through stage 4 chronic kidney disease, or unspecified chronic kidney disease: Secondary | ICD-10-CM | POA: Diagnosis not present

## 2021-01-04 DIAGNOSIS — I480 Paroxysmal atrial fibrillation: Secondary | ICD-10-CM | POA: Diagnosis not present

## 2021-01-04 DIAGNOSIS — R339 Retention of urine, unspecified: Secondary | ICD-10-CM | POA: Diagnosis not present

## 2021-01-04 DIAGNOSIS — E1122 Type 2 diabetes mellitus with diabetic chronic kidney disease: Secondary | ICD-10-CM | POA: Diagnosis not present

## 2021-01-04 DIAGNOSIS — Z466 Encounter for fitting and adjustment of urinary device: Secondary | ICD-10-CM | POA: Diagnosis not present

## 2021-01-04 DIAGNOSIS — N3946 Mixed incontinence: Secondary | ICD-10-CM | POA: Diagnosis not present

## 2021-01-07 DIAGNOSIS — I480 Paroxysmal atrial fibrillation: Secondary | ICD-10-CM | POA: Diagnosis not present

## 2021-01-07 DIAGNOSIS — R339 Retention of urine, unspecified: Secondary | ICD-10-CM | POA: Diagnosis not present

## 2021-01-07 DIAGNOSIS — Z466 Encounter for fitting and adjustment of urinary device: Secondary | ICD-10-CM | POA: Diagnosis not present

## 2021-01-07 DIAGNOSIS — I129 Hypertensive chronic kidney disease with stage 1 through stage 4 chronic kidney disease, or unspecified chronic kidney disease: Secondary | ICD-10-CM | POA: Diagnosis not present

## 2021-01-07 DIAGNOSIS — E1122 Type 2 diabetes mellitus with diabetic chronic kidney disease: Secondary | ICD-10-CM | POA: Diagnosis not present

## 2021-01-07 DIAGNOSIS — N3946 Mixed incontinence: Secondary | ICD-10-CM | POA: Diagnosis not present

## 2021-01-19 DIAGNOSIS — R5381 Other malaise: Secondary | ICD-10-CM | POA: Diagnosis not present

## 2021-01-19 DIAGNOSIS — E039 Hypothyroidism, unspecified: Secondary | ICD-10-CM | POA: Diagnosis not present

## 2021-01-19 DIAGNOSIS — N3946 Mixed incontinence: Secondary | ICD-10-CM | POA: Diagnosis not present

## 2021-01-19 DIAGNOSIS — I129 Hypertensive chronic kidney disease with stage 1 through stage 4 chronic kidney disease, or unspecified chronic kidney disease: Secondary | ICD-10-CM | POA: Diagnosis not present

## 2021-01-19 DIAGNOSIS — E1169 Type 2 diabetes mellitus with other specified complication: Secondary | ICD-10-CM | POA: Diagnosis not present

## 2021-01-19 DIAGNOSIS — D649 Anemia, unspecified: Secondary | ICD-10-CM | POA: Diagnosis not present

## 2021-01-19 DIAGNOSIS — E7800 Pure hypercholesterolemia, unspecified: Secondary | ICD-10-CM | POA: Diagnosis not present

## 2021-01-19 DIAGNOSIS — Z8673 Personal history of transient ischemic attack (TIA), and cerebral infarction without residual deficits: Secondary | ICD-10-CM | POA: Diagnosis not present

## 2021-01-19 DIAGNOSIS — K219 Gastro-esophageal reflux disease without esophagitis: Secondary | ICD-10-CM | POA: Diagnosis not present

## 2021-01-19 DIAGNOSIS — Z7984 Long term (current) use of oral hypoglycemic drugs: Secondary | ICD-10-CM | POA: Diagnosis not present

## 2021-01-19 DIAGNOSIS — I480 Paroxysmal atrial fibrillation: Secondary | ICD-10-CM | POA: Diagnosis not present

## 2021-01-19 DIAGNOSIS — N183 Chronic kidney disease, stage 3 unspecified: Secondary | ICD-10-CM | POA: Diagnosis not present

## 2021-01-19 DIAGNOSIS — R609 Edema, unspecified: Secondary | ICD-10-CM | POA: Diagnosis not present

## 2021-01-25 DIAGNOSIS — N3946 Mixed incontinence: Secondary | ICD-10-CM | POA: Diagnosis not present

## 2021-01-25 DIAGNOSIS — Z466 Encounter for fitting and adjustment of urinary device: Secondary | ICD-10-CM | POA: Diagnosis not present

## 2021-01-25 DIAGNOSIS — I480 Paroxysmal atrial fibrillation: Secondary | ICD-10-CM | POA: Diagnosis not present

## 2021-01-25 DIAGNOSIS — E1122 Type 2 diabetes mellitus with diabetic chronic kidney disease: Secondary | ICD-10-CM | POA: Diagnosis not present

## 2021-01-25 DIAGNOSIS — R339 Retention of urine, unspecified: Secondary | ICD-10-CM | POA: Diagnosis not present

## 2021-01-25 DIAGNOSIS — I129 Hypertensive chronic kidney disease with stage 1 through stage 4 chronic kidney disease, or unspecified chronic kidney disease: Secondary | ICD-10-CM | POA: Diagnosis not present

## 2021-01-30 DIAGNOSIS — E1142 Type 2 diabetes mellitus with diabetic polyneuropathy: Secondary | ICD-10-CM | POA: Diagnosis not present

## 2021-01-30 DIAGNOSIS — K5900 Constipation, unspecified: Secondary | ICD-10-CM | POA: Diagnosis not present

## 2021-01-30 DIAGNOSIS — N184 Chronic kidney disease, stage 4 (severe): Secondary | ICD-10-CM | POA: Diagnosis not present

## 2021-01-30 DIAGNOSIS — Z794 Long term (current) use of insulin: Secondary | ICD-10-CM | POA: Diagnosis not present

## 2021-01-30 DIAGNOSIS — I480 Paroxysmal atrial fibrillation: Secondary | ICD-10-CM | POA: Diagnosis not present

## 2021-01-30 DIAGNOSIS — Z5181 Encounter for therapeutic drug level monitoring: Secondary | ICD-10-CM | POA: Diagnosis not present

## 2021-01-30 DIAGNOSIS — E1122 Type 2 diabetes mellitus with diabetic chronic kidney disease: Secondary | ICD-10-CM | POA: Diagnosis not present

## 2021-01-30 DIAGNOSIS — E785 Hyperlipidemia, unspecified: Secondary | ICD-10-CM | POA: Diagnosis not present

## 2021-01-30 DIAGNOSIS — N3946 Mixed incontinence: Secondary | ICD-10-CM | POA: Diagnosis not present

## 2021-01-30 DIAGNOSIS — R339 Retention of urine, unspecified: Secondary | ICD-10-CM | POA: Diagnosis not present

## 2021-01-30 DIAGNOSIS — K219 Gastro-esophageal reflux disease without esophagitis: Secondary | ICD-10-CM | POA: Diagnosis not present

## 2021-01-30 DIAGNOSIS — Z7984 Long term (current) use of oral hypoglycemic drugs: Secondary | ICD-10-CM | POA: Diagnosis not present

## 2021-01-30 DIAGNOSIS — I129 Hypertensive chronic kidney disease with stage 1 through stage 4 chronic kidney disease, or unspecified chronic kidney disease: Secondary | ICD-10-CM | POA: Diagnosis not present

## 2021-01-30 DIAGNOSIS — Z7901 Long term (current) use of anticoagulants: Secondary | ICD-10-CM | POA: Diagnosis not present

## 2021-01-30 DIAGNOSIS — M6281 Muscle weakness (generalized): Secondary | ICD-10-CM | POA: Diagnosis not present

## 2021-01-30 DIAGNOSIS — Z466 Encounter for fitting and adjustment of urinary device: Secondary | ICD-10-CM | POA: Diagnosis not present

## 2021-02-05 DIAGNOSIS — N184 Chronic kidney disease, stage 4 (severe): Secondary | ICD-10-CM | POA: Diagnosis not present

## 2021-02-05 DIAGNOSIS — E1142 Type 2 diabetes mellitus with diabetic polyneuropathy: Secondary | ICD-10-CM | POA: Diagnosis not present

## 2021-02-05 DIAGNOSIS — I480 Paroxysmal atrial fibrillation: Secondary | ICD-10-CM | POA: Diagnosis not present

## 2021-02-05 DIAGNOSIS — E1122 Type 2 diabetes mellitus with diabetic chronic kidney disease: Secondary | ICD-10-CM | POA: Diagnosis not present

## 2021-02-05 DIAGNOSIS — R339 Retention of urine, unspecified: Secondary | ICD-10-CM | POA: Diagnosis not present

## 2021-02-05 DIAGNOSIS — Z466 Encounter for fitting and adjustment of urinary device: Secondary | ICD-10-CM | POA: Diagnosis not present

## 2021-02-07 DIAGNOSIS — E1142 Type 2 diabetes mellitus with diabetic polyneuropathy: Secondary | ICD-10-CM | POA: Diagnosis not present

## 2021-02-07 DIAGNOSIS — N184 Chronic kidney disease, stage 4 (severe): Secondary | ICD-10-CM | POA: Diagnosis not present

## 2021-02-07 DIAGNOSIS — Z466 Encounter for fitting and adjustment of urinary device: Secondary | ICD-10-CM | POA: Diagnosis not present

## 2021-02-07 DIAGNOSIS — E1122 Type 2 diabetes mellitus with diabetic chronic kidney disease: Secondary | ICD-10-CM | POA: Diagnosis not present

## 2021-02-07 DIAGNOSIS — I480 Paroxysmal atrial fibrillation: Secondary | ICD-10-CM | POA: Diagnosis not present

## 2021-02-07 DIAGNOSIS — R339 Retention of urine, unspecified: Secondary | ICD-10-CM | POA: Diagnosis not present

## 2021-02-10 DIAGNOSIS — Z466 Encounter for fitting and adjustment of urinary device: Secondary | ICD-10-CM | POA: Diagnosis not present

## 2021-02-10 DIAGNOSIS — E1142 Type 2 diabetes mellitus with diabetic polyneuropathy: Secondary | ICD-10-CM | POA: Diagnosis not present

## 2021-02-10 DIAGNOSIS — E1122 Type 2 diabetes mellitus with diabetic chronic kidney disease: Secondary | ICD-10-CM | POA: Diagnosis not present

## 2021-02-10 DIAGNOSIS — N184 Chronic kidney disease, stage 4 (severe): Secondary | ICD-10-CM | POA: Diagnosis not present

## 2021-02-10 DIAGNOSIS — I480 Paroxysmal atrial fibrillation: Secondary | ICD-10-CM | POA: Diagnosis not present

## 2021-02-10 DIAGNOSIS — R339 Retention of urine, unspecified: Secondary | ICD-10-CM | POA: Diagnosis not present

## 2021-02-14 DIAGNOSIS — N184 Chronic kidney disease, stage 4 (severe): Secondary | ICD-10-CM | POA: Diagnosis not present

## 2021-02-14 DIAGNOSIS — D509 Iron deficiency anemia, unspecified: Secondary | ICD-10-CM | POA: Diagnosis not present

## 2021-02-14 DIAGNOSIS — R54 Age-related physical debility: Secondary | ICD-10-CM | POA: Diagnosis not present

## 2021-02-14 DIAGNOSIS — E039 Hypothyroidism, unspecified: Secondary | ICD-10-CM | POA: Diagnosis not present

## 2021-02-14 DIAGNOSIS — K219 Gastro-esophageal reflux disease without esophagitis: Secondary | ICD-10-CM | POA: Diagnosis not present

## 2021-02-14 DIAGNOSIS — E1142 Type 2 diabetes mellitus with diabetic polyneuropathy: Secondary | ICD-10-CM | POA: Diagnosis not present

## 2021-02-14 DIAGNOSIS — E1122 Type 2 diabetes mellitus with diabetic chronic kidney disease: Secondary | ICD-10-CM | POA: Diagnosis not present

## 2021-02-14 DIAGNOSIS — E7800 Pure hypercholesterolemia, unspecified: Secondary | ICD-10-CM | POA: Diagnosis not present

## 2021-02-14 DIAGNOSIS — I480 Paroxysmal atrial fibrillation: Secondary | ICD-10-CM | POA: Diagnosis not present

## 2021-02-14 DIAGNOSIS — E1169 Type 2 diabetes mellitus with other specified complication: Secondary | ICD-10-CM | POA: Diagnosis not present

## 2021-02-14 DIAGNOSIS — I10 Essential (primary) hypertension: Secondary | ICD-10-CM | POA: Diagnosis not present

## 2021-02-14 DIAGNOSIS — I129 Hypertensive chronic kidney disease with stage 1 through stage 4 chronic kidney disease, or unspecified chronic kidney disease: Secondary | ICD-10-CM | POA: Diagnosis not present

## 2021-03-01 DIAGNOSIS — N184 Chronic kidney disease, stage 4 (severe): Secondary | ICD-10-CM | POA: Diagnosis not present

## 2021-03-01 DIAGNOSIS — Z5181 Encounter for therapeutic drug level monitoring: Secondary | ICD-10-CM | POA: Diagnosis not present

## 2021-03-01 DIAGNOSIS — K5900 Constipation, unspecified: Secondary | ICD-10-CM | POA: Diagnosis not present

## 2021-03-01 DIAGNOSIS — K219 Gastro-esophageal reflux disease without esophagitis: Secondary | ICD-10-CM | POA: Diagnosis not present

## 2021-03-01 DIAGNOSIS — E1122 Type 2 diabetes mellitus with diabetic chronic kidney disease: Secondary | ICD-10-CM | POA: Diagnosis not present

## 2021-03-01 DIAGNOSIS — M6281 Muscle weakness (generalized): Secondary | ICD-10-CM | POA: Diagnosis not present

## 2021-03-01 DIAGNOSIS — E1142 Type 2 diabetes mellitus with diabetic polyneuropathy: Secondary | ICD-10-CM | POA: Diagnosis not present

## 2021-03-01 DIAGNOSIS — R339 Retention of urine, unspecified: Secondary | ICD-10-CM | POA: Diagnosis not present

## 2021-03-01 DIAGNOSIS — N3946 Mixed incontinence: Secondary | ICD-10-CM | POA: Diagnosis not present

## 2021-03-01 DIAGNOSIS — Z7901 Long term (current) use of anticoagulants: Secondary | ICD-10-CM | POA: Diagnosis not present

## 2021-03-01 DIAGNOSIS — Z7984 Long term (current) use of oral hypoglycemic drugs: Secondary | ICD-10-CM | POA: Diagnosis not present

## 2021-03-01 DIAGNOSIS — I129 Hypertensive chronic kidney disease with stage 1 through stage 4 chronic kidney disease, or unspecified chronic kidney disease: Secondary | ICD-10-CM | POA: Diagnosis not present

## 2021-03-01 DIAGNOSIS — Z794 Long term (current) use of insulin: Secondary | ICD-10-CM | POA: Diagnosis not present

## 2021-03-01 DIAGNOSIS — I480 Paroxysmal atrial fibrillation: Secondary | ICD-10-CM | POA: Diagnosis not present

## 2021-03-01 DIAGNOSIS — Z466 Encounter for fitting and adjustment of urinary device: Secondary | ICD-10-CM | POA: Diagnosis not present

## 2021-03-01 DIAGNOSIS — E785 Hyperlipidemia, unspecified: Secondary | ICD-10-CM | POA: Diagnosis not present

## 2021-03-09 DIAGNOSIS — E1122 Type 2 diabetes mellitus with diabetic chronic kidney disease: Secondary | ICD-10-CM | POA: Diagnosis not present

## 2021-03-09 DIAGNOSIS — R339 Retention of urine, unspecified: Secondary | ICD-10-CM | POA: Diagnosis not present

## 2021-03-09 DIAGNOSIS — N184 Chronic kidney disease, stage 4 (severe): Secondary | ICD-10-CM | POA: Diagnosis not present

## 2021-03-09 DIAGNOSIS — E1142 Type 2 diabetes mellitus with diabetic polyneuropathy: Secondary | ICD-10-CM | POA: Diagnosis not present

## 2021-03-09 DIAGNOSIS — I480 Paroxysmal atrial fibrillation: Secondary | ICD-10-CM | POA: Diagnosis not present

## 2021-03-09 DIAGNOSIS — Z466 Encounter for fitting and adjustment of urinary device: Secondary | ICD-10-CM | POA: Diagnosis not present

## 2021-03-15 DIAGNOSIS — I10 Essential (primary) hypertension: Secondary | ICD-10-CM | POA: Diagnosis not present

## 2021-03-15 DIAGNOSIS — I480 Paroxysmal atrial fibrillation: Secondary | ICD-10-CM | POA: Diagnosis not present

## 2021-03-15 DIAGNOSIS — N184 Chronic kidney disease, stage 4 (severe): Secondary | ICD-10-CM | POA: Diagnosis not present

## 2021-03-15 DIAGNOSIS — E1169 Type 2 diabetes mellitus with other specified complication: Secondary | ICD-10-CM | POA: Diagnosis not present

## 2021-03-15 DIAGNOSIS — E1122 Type 2 diabetes mellitus with diabetic chronic kidney disease: Secondary | ICD-10-CM | POA: Diagnosis not present

## 2021-03-15 DIAGNOSIS — I129 Hypertensive chronic kidney disease with stage 1 through stage 4 chronic kidney disease, or unspecified chronic kidney disease: Secondary | ICD-10-CM | POA: Diagnosis not present

## 2021-03-15 DIAGNOSIS — E039 Hypothyroidism, unspecified: Secondary | ICD-10-CM | POA: Diagnosis not present

## 2021-03-15 DIAGNOSIS — E7800 Pure hypercholesterolemia, unspecified: Secondary | ICD-10-CM | POA: Diagnosis not present

## 2021-03-15 DIAGNOSIS — D509 Iron deficiency anemia, unspecified: Secondary | ICD-10-CM | POA: Diagnosis not present

## 2021-03-15 DIAGNOSIS — E1142 Type 2 diabetes mellitus with diabetic polyneuropathy: Secondary | ICD-10-CM | POA: Diagnosis not present

## 2021-03-15 DIAGNOSIS — E1140 Type 2 diabetes mellitus with diabetic neuropathy, unspecified: Secondary | ICD-10-CM | POA: Diagnosis not present

## 2021-03-15 DIAGNOSIS — R54 Age-related physical debility: Secondary | ICD-10-CM | POA: Diagnosis not present

## 2021-03-22 ENCOUNTER — Ambulatory Visit (INDEPENDENT_AMBULATORY_CARE_PROVIDER_SITE_OTHER): Payer: Medicare Other

## 2021-03-22 DIAGNOSIS — I495 Sick sinus syndrome: Secondary | ICD-10-CM | POA: Diagnosis not present

## 2021-03-22 LAB — CUP PACEART REMOTE DEVICE CHECK
Battery Remaining Longevity: 88 mo
Battery Voltage: 2.99 V
Brady Statistic AP VP Percent: 99.97 %
Brady Statistic AP VS Percent: 0 %
Brady Statistic AS VP Percent: 0 %
Brady Statistic AS VS Percent: 0.03 %
Brady Statistic RA Percent Paced: 100 %
Brady Statistic RV Percent Paced: 99.97 %
Date Time Interrogation Session: 20230123211148
Implantable Lead Implant Date: 19990907
Implantable Lead Implant Date: 19990907
Implantable Lead Location: 753859
Implantable Lead Location: 753860
Implantable Lead Serial Number: 32904
Implantable Lead Serial Number: 33421
Implantable Pulse Generator Implant Date: 20211014
Lead Channel Impedance Value: 228 Ohm
Lead Channel Impedance Value: 247 Ohm
Lead Channel Impedance Value: 266 Ohm
Lead Channel Impedance Value: 285 Ohm
Lead Channel Pacing Threshold Amplitude: 1.25 V
Lead Channel Pacing Threshold Pulse Width: 0.4 ms
Lead Channel Sensing Intrinsic Amplitude: 11.5 mV
Lead Channel Sensing Intrinsic Amplitude: 11.5 mV
Lead Channel Setting Pacing Amplitude: 2 V
Lead Channel Setting Pacing Amplitude: 2.5 V
Lead Channel Setting Pacing Pulse Width: 0.4 ms
Lead Channel Setting Sensing Sensitivity: 8 mV

## 2021-03-28 DIAGNOSIS — E1122 Type 2 diabetes mellitus with diabetic chronic kidney disease: Secondary | ICD-10-CM | POA: Diagnosis not present

## 2021-03-28 DIAGNOSIS — E1142 Type 2 diabetes mellitus with diabetic polyneuropathy: Secondary | ICD-10-CM | POA: Diagnosis not present

## 2021-03-28 DIAGNOSIS — Z466 Encounter for fitting and adjustment of urinary device: Secondary | ICD-10-CM | POA: Diagnosis not present

## 2021-03-28 DIAGNOSIS — N184 Chronic kidney disease, stage 4 (severe): Secondary | ICD-10-CM | POA: Diagnosis not present

## 2021-03-28 DIAGNOSIS — I480 Paroxysmal atrial fibrillation: Secondary | ICD-10-CM | POA: Diagnosis not present

## 2021-03-28 DIAGNOSIS — R339 Retention of urine, unspecified: Secondary | ICD-10-CM | POA: Diagnosis not present

## 2021-03-31 DIAGNOSIS — E785 Hyperlipidemia, unspecified: Secondary | ICD-10-CM | POA: Diagnosis not present

## 2021-03-31 DIAGNOSIS — K5900 Constipation, unspecified: Secondary | ICD-10-CM | POA: Diagnosis not present

## 2021-03-31 DIAGNOSIS — K219 Gastro-esophageal reflux disease without esophagitis: Secondary | ICD-10-CM | POA: Diagnosis not present

## 2021-03-31 DIAGNOSIS — Z7984 Long term (current) use of oral hypoglycemic drugs: Secondary | ICD-10-CM | POA: Diagnosis not present

## 2021-03-31 DIAGNOSIS — M6281 Muscle weakness (generalized): Secondary | ICD-10-CM | POA: Diagnosis not present

## 2021-03-31 DIAGNOSIS — E079 Disorder of thyroid, unspecified: Secondary | ICD-10-CM | POA: Diagnosis not present

## 2021-03-31 DIAGNOSIS — R339 Retention of urine, unspecified: Secondary | ICD-10-CM | POA: Diagnosis not present

## 2021-03-31 DIAGNOSIS — Z5181 Encounter for therapeutic drug level monitoring: Secondary | ICD-10-CM | POA: Diagnosis not present

## 2021-03-31 DIAGNOSIS — I480 Paroxysmal atrial fibrillation: Secondary | ICD-10-CM | POA: Diagnosis not present

## 2021-03-31 DIAGNOSIS — Z466 Encounter for fitting and adjustment of urinary device: Secondary | ICD-10-CM | POA: Diagnosis not present

## 2021-03-31 DIAGNOSIS — Z7901 Long term (current) use of anticoagulants: Secondary | ICD-10-CM | POA: Diagnosis not present

## 2021-03-31 DIAGNOSIS — I129 Hypertensive chronic kidney disease with stage 1 through stage 4 chronic kidney disease, or unspecified chronic kidney disease: Secondary | ICD-10-CM | POA: Diagnosis not present

## 2021-03-31 DIAGNOSIS — E1122 Type 2 diabetes mellitus with diabetic chronic kidney disease: Secondary | ICD-10-CM | POA: Diagnosis not present

## 2021-03-31 DIAGNOSIS — E1142 Type 2 diabetes mellitus with diabetic polyneuropathy: Secondary | ICD-10-CM | POA: Diagnosis not present

## 2021-03-31 DIAGNOSIS — Z794 Long term (current) use of insulin: Secondary | ICD-10-CM | POA: Diagnosis not present

## 2021-03-31 DIAGNOSIS — N3946 Mixed incontinence: Secondary | ICD-10-CM | POA: Diagnosis not present

## 2021-03-31 DIAGNOSIS — N184 Chronic kidney disease, stage 4 (severe): Secondary | ICD-10-CM | POA: Diagnosis not present

## 2021-04-01 NOTE — Progress Notes (Signed)
Remote pacemaker transmission.   

## 2021-04-02 DIAGNOSIS — M25511 Pain in right shoulder: Secondary | ICD-10-CM | POA: Diagnosis not present

## 2021-04-06 DIAGNOSIS — E1142 Type 2 diabetes mellitus with diabetic polyneuropathy: Secondary | ICD-10-CM | POA: Diagnosis not present

## 2021-04-06 DIAGNOSIS — Z466 Encounter for fitting and adjustment of urinary device: Secondary | ICD-10-CM | POA: Diagnosis not present

## 2021-04-06 DIAGNOSIS — R339 Retention of urine, unspecified: Secondary | ICD-10-CM | POA: Diagnosis not present

## 2021-04-06 DIAGNOSIS — E1122 Type 2 diabetes mellitus with diabetic chronic kidney disease: Secondary | ICD-10-CM | POA: Diagnosis not present

## 2021-04-06 DIAGNOSIS — N184 Chronic kidney disease, stage 4 (severe): Secondary | ICD-10-CM | POA: Diagnosis not present

## 2021-04-06 DIAGNOSIS — I480 Paroxysmal atrial fibrillation: Secondary | ICD-10-CM | POA: Diagnosis not present

## 2021-04-07 DIAGNOSIS — E1142 Type 2 diabetes mellitus with diabetic polyneuropathy: Secondary | ICD-10-CM | POA: Diagnosis not present

## 2021-04-07 DIAGNOSIS — R339 Retention of urine, unspecified: Secondary | ICD-10-CM | POA: Diagnosis not present

## 2021-04-07 DIAGNOSIS — E1122 Type 2 diabetes mellitus with diabetic chronic kidney disease: Secondary | ICD-10-CM | POA: Diagnosis not present

## 2021-04-07 DIAGNOSIS — N184 Chronic kidney disease, stage 4 (severe): Secondary | ICD-10-CM | POA: Diagnosis not present

## 2021-04-07 DIAGNOSIS — Z466 Encounter for fitting and adjustment of urinary device: Secondary | ICD-10-CM | POA: Diagnosis not present

## 2021-04-07 DIAGNOSIS — I480 Paroxysmal atrial fibrillation: Secondary | ICD-10-CM | POA: Diagnosis not present

## 2021-04-08 DIAGNOSIS — E1142 Type 2 diabetes mellitus with diabetic polyneuropathy: Secondary | ICD-10-CM | POA: Diagnosis not present

## 2021-04-08 DIAGNOSIS — N184 Chronic kidney disease, stage 4 (severe): Secondary | ICD-10-CM | POA: Diagnosis not present

## 2021-04-08 DIAGNOSIS — R339 Retention of urine, unspecified: Secondary | ICD-10-CM | POA: Diagnosis not present

## 2021-04-08 DIAGNOSIS — E1122 Type 2 diabetes mellitus with diabetic chronic kidney disease: Secondary | ICD-10-CM | POA: Diagnosis not present

## 2021-04-08 DIAGNOSIS — I480 Paroxysmal atrial fibrillation: Secondary | ICD-10-CM | POA: Diagnosis not present

## 2021-04-08 DIAGNOSIS — Z466 Encounter for fitting and adjustment of urinary device: Secondary | ICD-10-CM | POA: Diagnosis not present

## 2021-04-11 DIAGNOSIS — I480 Paroxysmal atrial fibrillation: Secondary | ICD-10-CM | POA: Diagnosis not present

## 2021-04-11 DIAGNOSIS — N184 Chronic kidney disease, stage 4 (severe): Secondary | ICD-10-CM | POA: Diagnosis not present

## 2021-04-11 DIAGNOSIS — E1122 Type 2 diabetes mellitus with diabetic chronic kidney disease: Secondary | ICD-10-CM | POA: Diagnosis not present

## 2021-04-11 DIAGNOSIS — R339 Retention of urine, unspecified: Secondary | ICD-10-CM | POA: Diagnosis not present

## 2021-04-11 DIAGNOSIS — E1142 Type 2 diabetes mellitus with diabetic polyneuropathy: Secondary | ICD-10-CM | POA: Diagnosis not present

## 2021-04-11 DIAGNOSIS — Z466 Encounter for fitting and adjustment of urinary device: Secondary | ICD-10-CM | POA: Diagnosis not present

## 2021-04-13 DIAGNOSIS — I480 Paroxysmal atrial fibrillation: Secondary | ICD-10-CM | POA: Diagnosis not present

## 2021-04-13 DIAGNOSIS — R339 Retention of urine, unspecified: Secondary | ICD-10-CM | POA: Diagnosis not present

## 2021-04-13 DIAGNOSIS — E1122 Type 2 diabetes mellitus with diabetic chronic kidney disease: Secondary | ICD-10-CM | POA: Diagnosis not present

## 2021-04-13 DIAGNOSIS — N184 Chronic kidney disease, stage 4 (severe): Secondary | ICD-10-CM | POA: Diagnosis not present

## 2021-04-13 DIAGNOSIS — E1142 Type 2 diabetes mellitus with diabetic polyneuropathy: Secondary | ICD-10-CM | POA: Diagnosis not present

## 2021-04-13 DIAGNOSIS — Z466 Encounter for fitting and adjustment of urinary device: Secondary | ICD-10-CM | POA: Diagnosis not present

## 2021-04-14 DIAGNOSIS — E7800 Pure hypercholesterolemia, unspecified: Secondary | ICD-10-CM | POA: Diagnosis not present

## 2021-04-14 DIAGNOSIS — Z466 Encounter for fitting and adjustment of urinary device: Secondary | ICD-10-CM | POA: Diagnosis not present

## 2021-04-14 DIAGNOSIS — N184 Chronic kidney disease, stage 4 (severe): Secondary | ICD-10-CM | POA: Diagnosis not present

## 2021-04-14 DIAGNOSIS — E1142 Type 2 diabetes mellitus with diabetic polyneuropathy: Secondary | ICD-10-CM | POA: Diagnosis not present

## 2021-04-14 DIAGNOSIS — I480 Paroxysmal atrial fibrillation: Secondary | ICD-10-CM | POA: Diagnosis not present

## 2021-04-14 DIAGNOSIS — E1122 Type 2 diabetes mellitus with diabetic chronic kidney disease: Secondary | ICD-10-CM | POA: Diagnosis not present

## 2021-04-14 DIAGNOSIS — R339 Retention of urine, unspecified: Secondary | ICD-10-CM | POA: Diagnosis not present

## 2021-04-14 DIAGNOSIS — I10 Essential (primary) hypertension: Secondary | ICD-10-CM | POA: Diagnosis not present

## 2021-04-14 DIAGNOSIS — I4891 Unspecified atrial fibrillation: Secondary | ICD-10-CM | POA: Diagnosis not present

## 2021-04-16 NOTE — Progress Notes (Signed)
Cardiology Office Note Date:  04/16/2021  Patient ID:  Tiffany [name], Tiffany [name] 1931/06/24, MRN [id number] PCP:  Antony Contras, MD  Electrophysiologist: Dr. Rayann Heman    Chief Complaint: annual visit  History of Present Illness: Tiffany [name] is a 88 y.o. female with history of DM, HTN, HLD, hypothyroidism, stroke, Afib, tachy-brady w/PPM  She comes today to be seen for Dr. Rayann Heman, last seen by him Feb 2022, she was post gen change, doing well.  Device dependent at that visit.  Had not had Afib in years, maintained on AC.  No changes were made.  TODAY She is accompanied by her daughter today She was at an [facility] a couple weeks ago with right shoulder pain got an injection that helped, noticed a large bruise on her arm some days later, not painful No CP, palpitations or cardiac awareness No near syncope or syncope. Some DOE with ambulation, does not seem to be new in d/w her daughter, perhaps increasing as she ages.  INR today with PMD was 2.1 Outside of the arm bruising, no bleeding or signs of bleeding   Device information MDT dual chamber PPM, implanted 11/03/1997 (intermed[employer] leads), most recent gen change 12/11/2019   Past Medical History:  Diagnosis Date   Chronic kidney disease    Diverticulosis    DM2 (diabetes mellitus, type 2) ([place])    Esophageal reflux    HTN (hypertension)    Hyperlipidemia    Hypothyroidism    Microalbuminuria    last done in 1/07    Paroxysmal atrial fibrillation (HCC)    and atrial tachycardia   Presence of permanent cardiac pacemaker    Stroke ([place]) 1999   Tachycardia-bradycardia syndrome ([place])    s/p PPM   Unspecified hypothyroidism 11/20/2013    Past Surgical History:  Procedure Laterality Date   CHOLECYSTECTOMY     COLONOSCOPY N/A 11/21/2013   Procedure: COLONOSCOPY;  Surgeon: Jeryl Columbia, MD;  Location: [facility];  Service: Endoscopy;  Laterality: N/A;   ESOPHAGOGASTRODUODENOSCOPY (EGD) WITH PROPOFOL N/A 05/26/2020    Procedure: ESOPHAGOGASTRODUODENOSCOPY (EGD) WITH PROPOFOL;  Surgeon: Ronnette Juniper, MD;  Location: [facility];  Service: Gastroenterology;  Laterality: N/A;   HEMORRHOID SURGERY     hysterectomy (other)     PACEMAKER GENERATOR CHANGE  10/03/12   DMT Advisa DR gen change by Dr Rayann Heman   PACEMAKER GENERATOR CHANGE N/A 10/03/2012   Procedure: PACEMAKER GENERATOR CHANGE;  Surgeon: Thompson Grayer, MD;  Location: [facility] CATH LAB;  Service: Cardiovascular;  Laterality: N/A;   permanent pacemaker  1999   updated 2006 (MDT) by Dr Verlon Setting; gen change 09-2012 by Dr Rayann Heman (MDT)   [place] N/A 12/11/2019   Procedure: PPM GENERATOR CHANGEOUT;  Surgeon: Thompson Grayer, MD;  Location: [place] CV LAB;  Service: Cardiovascular;  Laterality: N/A;    Current Outpatient Medications  Medication Sig Dispense Refill   diltiazem (TIAZAC) 120 MG 24 hr capsule Take 120 mg by mouth daily.     FERGON 240 (27 FE) MG tablet Take 240 mg by mouth in the morning and at bedtime.  3   furosemide (LASIX) 20 MG tablet Take 20 mg by mouth daily.     glipiZIDE (GLUCOTROL) 5 MG tablet Take 5 mg by mouth daily before breakfast.      levothyroxine (SYNTHROID, LEVOTHROID) 50 MCG tablet Take 50 mcg by mouth daily before breakfast.      Multiple Vitamins-Minerals (MULTIVITAMIN WITH MINERALS) tablet Take 1 tablet by mouth daily.  OZEMPIC, 1 MG/DOSE, 4 MG/3ML SOPN Inject 1 mg into the skin once a week.     potassium chloride SA (K-DUR,KLOR-CON) 20 MEQ tablet Take 20 mEq by mouth daily.     rosuvastatin (CRESTOR) 10 MG tablet Take 10 mg by mouth daily.     warfarin (COUMADIN) 2 MG tablet Take 2 mg by mouth daily.     No current facility-administered medications for this visit.    Allergies:   Other and Sitagliptin   Social History:  The patient  reports that she has never smoked. She has never used smokeless tobacco. She reports that she does not drink alcohol and does not use drugs.   Family History:  The patient's family  history includes Diabetes in her father; Tuberculosis in her mother.  ROS:  Please see the history of present illness.    All other systems are reviewed and otherwise negative.   PHYSICAL EXAM:  VS:  There were no vitals taken for this visit. BMI: There is no height or weight on file to calculate BMI. Well nourished, well developed, in no acute distress HEENT: normocephalic, atraumatic Neck: no JVD, carotid bruits or masses Cardiac:  RRR; 2/6SM, no rubs, or gallops Lungs:  CTA b/l, no wheezing, rhonchi or rales Abd: soft, nontender MS: no deformity or advanced/age appropriate atrophy R upper arm with old area of ecchymosis, yellow/green, no hematoma Ext:  no edema Skin: warm and dry, no rash Neuro:  No gross deficits appreciated Psych: euthymic mood, full affect  PPM site is stable (R side), no tethering or discomfort   EKG:  Done today and reviewed by myself shows  AV paced 72bpm  Device interrogation done today and reviewed by myself:  Battery and lead measurements are good RA lead threshold up some 1.25/0.4, 1.5/0.4, 1.25/0.8 1.0/1.0 and 0.75/1.0 PW changed to 1.0 2NSVT No P or R waves today at 40   11/19/2014: TTE Study Conclusions  - Left ventricle: The cavity size was normal. Wall thickness was    increased in a pattern of moderate LVH. Systolic function was    normal. The estimated ejection fraction was in the range of 50%    to 55%. Wall motion was normal; there were no regional wall    motion abnormalities.  - Aortic valve: There was mild regurgitation.  - Left atrium: The atrium was mildly to moderately dilated.  - Right atrium: The atrium was mildly to moderately dilated.  - Pulmonary arteries: Systolic pressure was mildly increased. PA    peak pressure: 35 mm Hg (S).    Recent Labs: 05/25/2020: ALT 23 05/27/2020: BUN 36; Creatinine, Ser 2.22; Hemoglobin 8.4; Platelets 182; Potassium 4.8; Sodium 138  No results found for requested labs within last 8760  hours.   CrCl cannot be calculated (Patient's most recent lab result is older than the maximum 21 days allowed.).   Wt Readings from Last 3 Encounters:  05/25/20 153 lb (69.4 kg)  04/12/20 154 lb 6.4 oz (70 kg)  12/11/19 160 lb (72.6 kg)     Other studies reviewed: Additional studies/records reviewed today include: summarized above  ASSESSMENT AND PLAN:  PPM Intact function, programming as above A lead impedance is stable  Paroxysmal Afib CHA2DS2Vasc is 7, on warfarin, monitored and managed by her PMD 0 % burden  HTN Looks good ,checked myself, equal both arms  4. Murmur on exam She tells me that she has been told many years that she has a murmur and that she is not  inclined to want to purse any kind of intervention. No clear clinical symptoms of significant VHD Will hold off on echo for now   Disposition: F/u with remotes as usual, will have her see Dr. Curt Bears next year, to follow her device with Dr. Jackalyn Lombard retirement  Current medicines are reviewed at length with the patient today.  The patient did not have any concerns regarding medicines.  Venetia Night, PA-C 04/16/2021 8:16 AM     [facility] [address] [place] [address] [phone] (office)  [phone] (fax)

## 2021-04-18 ENCOUNTER — Encounter: Payer: Self-pay | Admitting: Physician Assistant

## 2021-04-18 ENCOUNTER — Other Ambulatory Visit: Payer: Self-pay

## 2021-04-18 ENCOUNTER — Ambulatory Visit (INDEPENDENT_AMBULATORY_CARE_PROVIDER_SITE_OTHER): Payer: Medicare Other | Admitting: Physician Assistant

## 2021-04-18 VITALS — BP 112/58 | HR 72 | Ht 62.5 in | Wt 151.6 lb

## 2021-04-18 DIAGNOSIS — Z466 Encounter for fitting and adjustment of urinary device: Secondary | ICD-10-CM | POA: Diagnosis not present

## 2021-04-18 DIAGNOSIS — R011 Cardiac murmur, unspecified: Secondary | ICD-10-CM

## 2021-04-18 DIAGNOSIS — E1122 Type 2 diabetes mellitus with diabetic chronic kidney disease: Secondary | ICD-10-CM | POA: Diagnosis not present

## 2021-04-18 DIAGNOSIS — I480 Paroxysmal atrial fibrillation: Secondary | ICD-10-CM

## 2021-04-18 DIAGNOSIS — I495 Sick sinus syndrome: Secondary | ICD-10-CM

## 2021-04-18 DIAGNOSIS — I4891 Unspecified atrial fibrillation: Secondary | ICD-10-CM

## 2021-04-18 DIAGNOSIS — I10 Essential (primary) hypertension: Secondary | ICD-10-CM | POA: Diagnosis not present

## 2021-04-18 DIAGNOSIS — Z950 Presence of cardiac pacemaker: Secondary | ICD-10-CM | POA: Diagnosis not present

## 2021-04-18 DIAGNOSIS — E1142 Type 2 diabetes mellitus with diabetic polyneuropathy: Secondary | ICD-10-CM | POA: Diagnosis not present

## 2021-04-18 DIAGNOSIS — N184 Chronic kidney disease, stage 4 (severe): Secondary | ICD-10-CM | POA: Diagnosis not present

## 2021-04-18 DIAGNOSIS — R339 Retention of urine, unspecified: Secondary | ICD-10-CM | POA: Diagnosis not present

## 2021-04-18 LAB — CUP PACEART INCLINIC DEVICE CHECK
Battery Remaining Longevity: 72 mo
Battery Voltage: 2.99 V
Brady Statistic AP VP Percent: 99.96 %
Brady Statistic AP VS Percent: 0 %
Brady Statistic AS VP Percent: 0 %
Brady Statistic AS VS Percent: 0.03 %
Brady Statistic RA Percent Paced: 100 %
Brady Statistic RV Percent Paced: 99.96 %
Date Time Interrogation Session: 20230220142623
Implantable Lead Implant Date: 19990907
Implantable Lead Implant Date: 19990907
Implantable Lead Location: 753859
Implantable Lead Location: 753860
Implantable Lead Serial Number: 32904
Implantable Lead Serial Number: 33421
Implantable Pulse Generator Implant Date: 20211014
Lead Channel Impedance Value: 228 Ohm
Lead Channel Impedance Value: 247 Ohm
Lead Channel Impedance Value: 247 Ohm
Lead Channel Impedance Value: 266 Ohm
Lead Channel Pacing Threshold Amplitude: 1 V
Lead Channel Pacing Threshold Pulse Width: 0.4 ms
Lead Channel Sensing Intrinsic Amplitude: 11.5 mV
Lead Channel Sensing Intrinsic Amplitude: 11.5 mV
Lead Channel Setting Pacing Amplitude: 2 V
Lead Channel Setting Pacing Amplitude: 2.5 V
Lead Channel Setting Pacing Pulse Width: 0.4 ms
Lead Channel Setting Sensing Sensitivity: 8 mV

## 2021-04-18 NOTE — Patient Instructions (Addendum)

## 2021-04-19 DIAGNOSIS — E1142 Type 2 diabetes mellitus with diabetic polyneuropathy: Secondary | ICD-10-CM | POA: Diagnosis not present

## 2021-04-19 DIAGNOSIS — I480 Paroxysmal atrial fibrillation: Secondary | ICD-10-CM | POA: Diagnosis not present

## 2021-04-19 DIAGNOSIS — E1122 Type 2 diabetes mellitus with diabetic chronic kidney disease: Secondary | ICD-10-CM | POA: Diagnosis not present

## 2021-04-19 DIAGNOSIS — R339 Retention of urine, unspecified: Secondary | ICD-10-CM | POA: Diagnosis not present

## 2021-04-19 DIAGNOSIS — Z466 Encounter for fitting and adjustment of urinary device: Secondary | ICD-10-CM | POA: Diagnosis not present

## 2021-04-19 DIAGNOSIS — N184 Chronic kidney disease, stage 4 (severe): Secondary | ICD-10-CM | POA: Diagnosis not present

## 2021-04-21 DIAGNOSIS — E1122 Type 2 diabetes mellitus with diabetic chronic kidney disease: Secondary | ICD-10-CM | POA: Diagnosis not present

## 2021-04-21 DIAGNOSIS — R339 Retention of urine, unspecified: Secondary | ICD-10-CM | POA: Diagnosis not present

## 2021-04-21 DIAGNOSIS — Z466 Encounter for fitting and adjustment of urinary device: Secondary | ICD-10-CM | POA: Diagnosis not present

## 2021-04-21 DIAGNOSIS — N184 Chronic kidney disease, stage 4 (severe): Secondary | ICD-10-CM | POA: Diagnosis not present

## 2021-04-21 DIAGNOSIS — E1142 Type 2 diabetes mellitus with diabetic polyneuropathy: Secondary | ICD-10-CM | POA: Diagnosis not present

## 2021-04-21 DIAGNOSIS — I480 Paroxysmal atrial fibrillation: Secondary | ICD-10-CM | POA: Diagnosis not present

## 2021-04-25 DIAGNOSIS — E1142 Type 2 diabetes mellitus with diabetic polyneuropathy: Secondary | ICD-10-CM | POA: Diagnosis not present

## 2021-04-25 DIAGNOSIS — E1122 Type 2 diabetes mellitus with diabetic chronic kidney disease: Secondary | ICD-10-CM | POA: Diagnosis not present

## 2021-04-25 DIAGNOSIS — R339 Retention of urine, unspecified: Secondary | ICD-10-CM | POA: Diagnosis not present

## 2021-04-25 DIAGNOSIS — N184 Chronic kidney disease, stage 4 (severe): Secondary | ICD-10-CM | POA: Diagnosis not present

## 2021-04-25 DIAGNOSIS — Z466 Encounter for fitting and adjustment of urinary device: Secondary | ICD-10-CM | POA: Diagnosis not present

## 2021-04-25 DIAGNOSIS — I480 Paroxysmal atrial fibrillation: Secondary | ICD-10-CM | POA: Diagnosis not present

## 2021-04-26 DIAGNOSIS — E113391 Type 2 diabetes mellitus with moderate nonproliferative diabetic retinopathy without macular edema, right eye: Secondary | ICD-10-CM | POA: Diagnosis not present

## 2021-04-26 DIAGNOSIS — E113292 Type 2 diabetes mellitus with mild nonproliferative diabetic retinopathy without macular edema, left eye: Secondary | ICD-10-CM | POA: Diagnosis not present

## 2021-04-27 DIAGNOSIS — E1142 Type 2 diabetes mellitus with diabetic polyneuropathy: Secondary | ICD-10-CM | POA: Diagnosis not present

## 2021-04-27 DIAGNOSIS — I480 Paroxysmal atrial fibrillation: Secondary | ICD-10-CM | POA: Diagnosis not present

## 2021-04-27 DIAGNOSIS — E1122 Type 2 diabetes mellitus with diabetic chronic kidney disease: Secondary | ICD-10-CM | POA: Diagnosis not present

## 2021-04-27 DIAGNOSIS — N184 Chronic kidney disease, stage 4 (severe): Secondary | ICD-10-CM | POA: Diagnosis not present

## 2021-04-27 DIAGNOSIS — R339 Retention of urine, unspecified: Secondary | ICD-10-CM | POA: Diagnosis not present

## 2021-04-27 DIAGNOSIS — Z466 Encounter for fitting and adjustment of urinary device: Secondary | ICD-10-CM | POA: Diagnosis not present

## 2021-04-30 DIAGNOSIS — R339 Retention of urine, unspecified: Secondary | ICD-10-CM | POA: Diagnosis not present

## 2021-04-30 DIAGNOSIS — E1142 Type 2 diabetes mellitus with diabetic polyneuropathy: Secondary | ICD-10-CM | POA: Diagnosis not present

## 2021-04-30 DIAGNOSIS — Z5181 Encounter for therapeutic drug level monitoring: Secondary | ICD-10-CM | POA: Diagnosis not present

## 2021-04-30 DIAGNOSIS — K5900 Constipation, unspecified: Secondary | ICD-10-CM | POA: Diagnosis not present

## 2021-04-30 DIAGNOSIS — E1122 Type 2 diabetes mellitus with diabetic chronic kidney disease: Secondary | ICD-10-CM | POA: Diagnosis not present

## 2021-04-30 DIAGNOSIS — I480 Paroxysmal atrial fibrillation: Secondary | ICD-10-CM | POA: Diagnosis not present

## 2021-04-30 DIAGNOSIS — Z7901 Long term (current) use of anticoagulants: Secondary | ICD-10-CM | POA: Diagnosis not present

## 2021-04-30 DIAGNOSIS — Z7984 Long term (current) use of oral hypoglycemic drugs: Secondary | ICD-10-CM | POA: Diagnosis not present

## 2021-04-30 DIAGNOSIS — Z794 Long term (current) use of insulin: Secondary | ICD-10-CM | POA: Diagnosis not present

## 2021-04-30 DIAGNOSIS — I129 Hypertensive chronic kidney disease with stage 1 through stage 4 chronic kidney disease, or unspecified chronic kidney disease: Secondary | ICD-10-CM | POA: Diagnosis not present

## 2021-04-30 DIAGNOSIS — N3946 Mixed incontinence: Secondary | ICD-10-CM | POA: Diagnosis not present

## 2021-04-30 DIAGNOSIS — E079 Disorder of thyroid, unspecified: Secondary | ICD-10-CM | POA: Diagnosis not present

## 2021-04-30 DIAGNOSIS — Z466 Encounter for fitting and adjustment of urinary device: Secondary | ICD-10-CM | POA: Diagnosis not present

## 2021-04-30 DIAGNOSIS — M6281 Muscle weakness (generalized): Secondary | ICD-10-CM | POA: Diagnosis not present

## 2021-04-30 DIAGNOSIS — N184 Chronic kidney disease, stage 4 (severe): Secondary | ICD-10-CM | POA: Diagnosis not present

## 2021-04-30 DIAGNOSIS — E785 Hyperlipidemia, unspecified: Secondary | ICD-10-CM | POA: Diagnosis not present

## 2021-04-30 DIAGNOSIS — K219 Gastro-esophageal reflux disease without esophagitis: Secondary | ICD-10-CM | POA: Diagnosis not present

## 2021-05-02 DIAGNOSIS — R339 Retention of urine, unspecified: Secondary | ICD-10-CM | POA: Diagnosis not present

## 2021-05-02 DIAGNOSIS — E1122 Type 2 diabetes mellitus with diabetic chronic kidney disease: Secondary | ICD-10-CM | POA: Diagnosis not present

## 2021-05-02 DIAGNOSIS — N184 Chronic kidney disease, stage 4 (severe): Secondary | ICD-10-CM | POA: Diagnosis not present

## 2021-05-02 DIAGNOSIS — I480 Paroxysmal atrial fibrillation: Secondary | ICD-10-CM | POA: Diagnosis not present

## 2021-05-02 DIAGNOSIS — Z466 Encounter for fitting and adjustment of urinary device: Secondary | ICD-10-CM | POA: Diagnosis not present

## 2021-05-02 DIAGNOSIS — E1142 Type 2 diabetes mellitus with diabetic polyneuropathy: Secondary | ICD-10-CM | POA: Diagnosis not present

## 2021-05-03 DIAGNOSIS — N183 Chronic kidney disease, stage 3 unspecified: Secondary | ICD-10-CM | POA: Diagnosis not present

## 2021-05-03 DIAGNOSIS — I480 Paroxysmal atrial fibrillation: Secondary | ICD-10-CM | POA: Diagnosis not present

## 2021-05-03 DIAGNOSIS — Z7901 Long term (current) use of anticoagulants: Secondary | ICD-10-CM | POA: Diagnosis not present

## 2021-05-03 DIAGNOSIS — E039 Hypothyroidism, unspecified: Secondary | ICD-10-CM | POA: Diagnosis not present

## 2021-05-03 DIAGNOSIS — I129 Hypertensive chronic kidney disease with stage 1 through stage 4 chronic kidney disease, or unspecified chronic kidney disease: Secondary | ICD-10-CM | POA: Diagnosis not present

## 2021-05-03 DIAGNOSIS — D649 Anemia, unspecified: Secondary | ICD-10-CM | POA: Diagnosis not present

## 2021-05-03 DIAGNOSIS — E1122 Type 2 diabetes mellitus with diabetic chronic kidney disease: Secondary | ICD-10-CM | POA: Diagnosis not present

## 2021-05-03 DIAGNOSIS — E7800 Pure hypercholesterolemia, unspecified: Secondary | ICD-10-CM | POA: Diagnosis not present

## 2021-05-03 DIAGNOSIS — R5381 Other malaise: Secondary | ICD-10-CM | POA: Diagnosis not present

## 2021-05-03 DIAGNOSIS — Z7984 Long term (current) use of oral hypoglycemic drugs: Secondary | ICD-10-CM | POA: Diagnosis not present

## 2021-05-03 DIAGNOSIS — K219 Gastro-esophageal reflux disease without esophagitis: Secondary | ICD-10-CM | POA: Diagnosis not present

## 2021-05-03 DIAGNOSIS — Z7985 Long-term (current) use of injectable non-insulin antidiabetic drugs: Secondary | ICD-10-CM | POA: Diagnosis not present

## 2021-05-03 DIAGNOSIS — E1169 Type 2 diabetes mellitus with other specified complication: Secondary | ICD-10-CM | POA: Diagnosis not present

## 2021-05-04 DIAGNOSIS — E1142 Type 2 diabetes mellitus with diabetic polyneuropathy: Secondary | ICD-10-CM | POA: Diagnosis not present

## 2021-05-04 DIAGNOSIS — Z466 Encounter for fitting and adjustment of urinary device: Secondary | ICD-10-CM | POA: Diagnosis not present

## 2021-05-04 DIAGNOSIS — I480 Paroxysmal atrial fibrillation: Secondary | ICD-10-CM | POA: Diagnosis not present

## 2021-05-04 DIAGNOSIS — R339 Retention of urine, unspecified: Secondary | ICD-10-CM | POA: Diagnosis not present

## 2021-05-04 DIAGNOSIS — N184 Chronic kidney disease, stage 4 (severe): Secondary | ICD-10-CM | POA: Diagnosis not present

## 2021-05-04 DIAGNOSIS — E1122 Type 2 diabetes mellitus with diabetic chronic kidney disease: Secondary | ICD-10-CM | POA: Diagnosis not present

## 2021-05-06 DIAGNOSIS — I480 Paroxysmal atrial fibrillation: Secondary | ICD-10-CM | POA: Diagnosis not present

## 2021-05-06 DIAGNOSIS — E1142 Type 2 diabetes mellitus with diabetic polyneuropathy: Secondary | ICD-10-CM | POA: Diagnosis not present

## 2021-05-06 DIAGNOSIS — E1122 Type 2 diabetes mellitus with diabetic chronic kidney disease: Secondary | ICD-10-CM | POA: Diagnosis not present

## 2021-05-06 DIAGNOSIS — Z466 Encounter for fitting and adjustment of urinary device: Secondary | ICD-10-CM | POA: Diagnosis not present

## 2021-05-06 DIAGNOSIS — R339 Retention of urine, unspecified: Secondary | ICD-10-CM | POA: Diagnosis not present

## 2021-05-06 DIAGNOSIS — N184 Chronic kidney disease, stage 4 (severe): Secondary | ICD-10-CM | POA: Diagnosis not present

## 2021-05-09 DIAGNOSIS — I480 Paroxysmal atrial fibrillation: Secondary | ICD-10-CM | POA: Diagnosis not present

## 2021-05-09 DIAGNOSIS — N184 Chronic kidney disease, stage 4 (severe): Secondary | ICD-10-CM | POA: Diagnosis not present

## 2021-05-09 DIAGNOSIS — E1122 Type 2 diabetes mellitus with diabetic chronic kidney disease: Secondary | ICD-10-CM | POA: Diagnosis not present

## 2021-05-09 DIAGNOSIS — Z466 Encounter for fitting and adjustment of urinary device: Secondary | ICD-10-CM | POA: Diagnosis not present

## 2021-05-09 DIAGNOSIS — E1142 Type 2 diabetes mellitus with diabetic polyneuropathy: Secondary | ICD-10-CM | POA: Diagnosis not present

## 2021-05-09 DIAGNOSIS — R339 Retention of urine, unspecified: Secondary | ICD-10-CM | POA: Diagnosis not present

## 2021-05-17 DIAGNOSIS — E7800 Pure hypercholesterolemia, unspecified: Secondary | ICD-10-CM | POA: Diagnosis not present

## 2021-05-17 DIAGNOSIS — E1140 Type 2 diabetes mellitus with diabetic neuropathy, unspecified: Secondary | ICD-10-CM | POA: Diagnosis not present

## 2021-05-17 DIAGNOSIS — N184 Chronic kidney disease, stage 4 (severe): Secondary | ICD-10-CM | POA: Diagnosis not present

## 2021-05-17 DIAGNOSIS — I10 Essential (primary) hypertension: Secondary | ICD-10-CM | POA: Diagnosis not present

## 2021-05-18 DIAGNOSIS — I480 Paroxysmal atrial fibrillation: Secondary | ICD-10-CM | POA: Diagnosis not present

## 2021-05-18 DIAGNOSIS — R339 Retention of urine, unspecified: Secondary | ICD-10-CM | POA: Diagnosis not present

## 2021-05-18 DIAGNOSIS — E1142 Type 2 diabetes mellitus with diabetic polyneuropathy: Secondary | ICD-10-CM | POA: Diagnosis not present

## 2021-05-18 DIAGNOSIS — E1122 Type 2 diabetes mellitus with diabetic chronic kidney disease: Secondary | ICD-10-CM | POA: Diagnosis not present

## 2021-05-18 DIAGNOSIS — Z466 Encounter for fitting and adjustment of urinary device: Secondary | ICD-10-CM | POA: Diagnosis not present

## 2021-05-18 DIAGNOSIS — N184 Chronic kidney disease, stage 4 (severe): Secondary | ICD-10-CM | POA: Diagnosis not present

## 2021-05-25 DIAGNOSIS — E1142 Type 2 diabetes mellitus with diabetic polyneuropathy: Secondary | ICD-10-CM | POA: Diagnosis not present

## 2021-05-25 DIAGNOSIS — I480 Paroxysmal atrial fibrillation: Secondary | ICD-10-CM | POA: Diagnosis not present

## 2021-05-25 DIAGNOSIS — Z466 Encounter for fitting and adjustment of urinary device: Secondary | ICD-10-CM | POA: Diagnosis not present

## 2021-05-25 DIAGNOSIS — N184 Chronic kidney disease, stage 4 (severe): Secondary | ICD-10-CM | POA: Diagnosis not present

## 2021-05-25 DIAGNOSIS — R339 Retention of urine, unspecified: Secondary | ICD-10-CM | POA: Diagnosis not present

## 2021-05-25 DIAGNOSIS — E1122 Type 2 diabetes mellitus with diabetic chronic kidney disease: Secondary | ICD-10-CM | POA: Diagnosis not present

## 2021-05-26 DIAGNOSIS — D631 Anemia in chronic kidney disease: Secondary | ICD-10-CM | POA: Diagnosis not present

## 2021-05-26 DIAGNOSIS — N184 Chronic kidney disease, stage 4 (severe): Secondary | ICD-10-CM | POA: Diagnosis not present

## 2021-05-26 DIAGNOSIS — I129 Hypertensive chronic kidney disease with stage 1 through stage 4 chronic kidney disease, or unspecified chronic kidney disease: Secondary | ICD-10-CM | POA: Diagnosis not present

## 2021-05-26 DIAGNOSIS — I4891 Unspecified atrial fibrillation: Secondary | ICD-10-CM | POA: Diagnosis not present

## 2021-05-26 DIAGNOSIS — E673 Hypervitaminosis D: Secondary | ICD-10-CM | POA: Diagnosis not present

## 2021-05-26 DIAGNOSIS — N2581 Secondary hyperparathyroidism of renal origin: Secondary | ICD-10-CM | POA: Diagnosis not present

## 2021-05-26 DIAGNOSIS — N189 Chronic kidney disease, unspecified: Secondary | ICD-10-CM | POA: Diagnosis not present

## 2021-05-30 DIAGNOSIS — Z5181 Encounter for therapeutic drug level monitoring: Secondary | ICD-10-CM | POA: Diagnosis not present

## 2021-05-30 DIAGNOSIS — Z466 Encounter for fitting and adjustment of urinary device: Secondary | ICD-10-CM | POA: Diagnosis not present

## 2021-05-30 DIAGNOSIS — E1122 Type 2 diabetes mellitus with diabetic chronic kidney disease: Secondary | ICD-10-CM | POA: Diagnosis not present

## 2021-05-30 DIAGNOSIS — Z7984 Long term (current) use of oral hypoglycemic drugs: Secondary | ICD-10-CM | POA: Diagnosis not present

## 2021-05-30 DIAGNOSIS — E079 Disorder of thyroid, unspecified: Secondary | ICD-10-CM | POA: Diagnosis not present

## 2021-05-30 DIAGNOSIS — I480 Paroxysmal atrial fibrillation: Secondary | ICD-10-CM | POA: Diagnosis not present

## 2021-05-30 DIAGNOSIS — Z7985 Long-term (current) use of injectable non-insulin antidiabetic drugs: Secondary | ICD-10-CM | POA: Diagnosis not present

## 2021-05-30 DIAGNOSIS — K219 Gastro-esophageal reflux disease without esophagitis: Secondary | ICD-10-CM | POA: Diagnosis not present

## 2021-05-30 DIAGNOSIS — R339 Retention of urine, unspecified: Secondary | ICD-10-CM | POA: Diagnosis not present

## 2021-05-30 DIAGNOSIS — E1142 Type 2 diabetes mellitus with diabetic polyneuropathy: Secondary | ICD-10-CM | POA: Diagnosis not present

## 2021-05-30 DIAGNOSIS — Z7901 Long term (current) use of anticoagulants: Secondary | ICD-10-CM | POA: Diagnosis not present

## 2021-05-30 DIAGNOSIS — E785 Hyperlipidemia, unspecified: Secondary | ICD-10-CM | POA: Diagnosis not present

## 2021-05-30 DIAGNOSIS — M6281 Muscle weakness (generalized): Secondary | ICD-10-CM | POA: Diagnosis not present

## 2021-05-30 DIAGNOSIS — N3946 Mixed incontinence: Secondary | ICD-10-CM | POA: Diagnosis not present

## 2021-05-30 DIAGNOSIS — N184 Chronic kidney disease, stage 4 (severe): Secondary | ICD-10-CM | POA: Diagnosis not present

## 2021-05-30 DIAGNOSIS — I129 Hypertensive chronic kidney disease with stage 1 through stage 4 chronic kidney disease, or unspecified chronic kidney disease: Secondary | ICD-10-CM | POA: Diagnosis not present

## 2021-05-30 DIAGNOSIS — K5900 Constipation, unspecified: Secondary | ICD-10-CM | POA: Diagnosis not present

## 2021-06-01 DIAGNOSIS — I10 Essential (primary) hypertension: Secondary | ICD-10-CM | POA: Diagnosis not present

## 2021-06-01 DIAGNOSIS — E1142 Type 2 diabetes mellitus with diabetic polyneuropathy: Secondary | ICD-10-CM | POA: Diagnosis not present

## 2021-06-01 DIAGNOSIS — E7800 Pure hypercholesterolemia, unspecified: Secondary | ICD-10-CM | POA: Diagnosis not present

## 2021-06-01 DIAGNOSIS — I480 Paroxysmal atrial fibrillation: Secondary | ICD-10-CM | POA: Diagnosis not present

## 2021-06-07 DIAGNOSIS — N184 Chronic kidney disease, stage 4 (severe): Secondary | ICD-10-CM | POA: Diagnosis not present

## 2021-06-07 DIAGNOSIS — I480 Paroxysmal atrial fibrillation: Secondary | ICD-10-CM | POA: Diagnosis not present

## 2021-06-07 DIAGNOSIS — Z466 Encounter for fitting and adjustment of urinary device: Secondary | ICD-10-CM | POA: Diagnosis not present

## 2021-06-07 DIAGNOSIS — R339 Retention of urine, unspecified: Secondary | ICD-10-CM | POA: Diagnosis not present

## 2021-06-07 DIAGNOSIS — E1142 Type 2 diabetes mellitus with diabetic polyneuropathy: Secondary | ICD-10-CM | POA: Diagnosis not present

## 2021-06-07 DIAGNOSIS — E1122 Type 2 diabetes mellitus with diabetic chronic kidney disease: Secondary | ICD-10-CM | POA: Diagnosis not present

## 2021-06-15 DIAGNOSIS — Z466 Encounter for fitting and adjustment of urinary device: Secondary | ICD-10-CM | POA: Diagnosis not present

## 2021-06-15 DIAGNOSIS — N184 Chronic kidney disease, stage 4 (severe): Secondary | ICD-10-CM | POA: Diagnosis not present

## 2021-06-15 DIAGNOSIS — E1142 Type 2 diabetes mellitus with diabetic polyneuropathy: Secondary | ICD-10-CM | POA: Diagnosis not present

## 2021-06-15 DIAGNOSIS — I480 Paroxysmal atrial fibrillation: Secondary | ICD-10-CM | POA: Diagnosis not present

## 2021-06-15 DIAGNOSIS — R339 Retention of urine, unspecified: Secondary | ICD-10-CM | POA: Diagnosis not present

## 2021-06-15 DIAGNOSIS — E1122 Type 2 diabetes mellitus with diabetic chronic kidney disease: Secondary | ICD-10-CM | POA: Diagnosis not present

## 2021-06-21 ENCOUNTER — Ambulatory Visit (INDEPENDENT_AMBULATORY_CARE_PROVIDER_SITE_OTHER): Payer: Medicare Other

## 2021-06-21 DIAGNOSIS — I495 Sick sinus syndrome: Secondary | ICD-10-CM | POA: Diagnosis not present

## 2021-06-21 LAB — CUP PACEART REMOTE DEVICE CHECK
Battery Remaining Longevity: 69 mo
Battery Voltage: 2.98 V
Brady Statistic AP VP Percent: 99.92 %
Brady Statistic AP VS Percent: 0.01 %
Brady Statistic AS VP Percent: 0 %
Brady Statistic AS VS Percent: 0.07 %
Brady Statistic RA Percent Paced: 100 %
Brady Statistic RV Percent Paced: 99.92 %
Date Time Interrogation Session: 20230424194426
Implantable Lead Implant Date: 19990907
Implantable Lead Implant Date: 19990907
Implantable Lead Location: 753859
Implantable Lead Location: 753860
Implantable Lead Serial Number: 32904
Implantable Lead Serial Number: 33421
Implantable Pulse Generator Implant Date: 20211014
Lead Channel Impedance Value: 228 Ohm
Lead Channel Impedance Value: 228 Ohm
Lead Channel Impedance Value: 228 Ohm
Lead Channel Impedance Value: 247 Ohm
Lead Channel Pacing Threshold Amplitude: 1 V
Lead Channel Pacing Threshold Pulse Width: 0.4 ms
Lead Channel Sensing Intrinsic Amplitude: 18.375 mV
Lead Channel Sensing Intrinsic Amplitude: 18.375 mV
Lead Channel Setting Pacing Amplitude: 2 V
Lead Channel Setting Pacing Amplitude: 2.5 V
Lead Channel Setting Pacing Pulse Width: 0.4 ms
Lead Channel Setting Sensing Sensitivity: 8 mV

## 2021-06-29 DIAGNOSIS — Z7985 Long-term (current) use of injectable non-insulin antidiabetic drugs: Secondary | ICD-10-CM | POA: Diagnosis not present

## 2021-06-29 DIAGNOSIS — N3946 Mixed incontinence: Secondary | ICD-10-CM | POA: Diagnosis not present

## 2021-06-29 DIAGNOSIS — Z7901 Long term (current) use of anticoagulants: Secondary | ICD-10-CM | POA: Diagnosis not present

## 2021-06-29 DIAGNOSIS — E079 Disorder of thyroid, unspecified: Secondary | ICD-10-CM | POA: Diagnosis not present

## 2021-06-29 DIAGNOSIS — E1122 Type 2 diabetes mellitus with diabetic chronic kidney disease: Secondary | ICD-10-CM | POA: Diagnosis not present

## 2021-06-29 DIAGNOSIS — K5900 Constipation, unspecified: Secondary | ICD-10-CM | POA: Diagnosis not present

## 2021-06-29 DIAGNOSIS — R339 Retention of urine, unspecified: Secondary | ICD-10-CM | POA: Diagnosis not present

## 2021-06-29 DIAGNOSIS — Z5181 Encounter for therapeutic drug level monitoring: Secondary | ICD-10-CM | POA: Diagnosis not present

## 2021-06-29 DIAGNOSIS — I480 Paroxysmal atrial fibrillation: Secondary | ICD-10-CM | POA: Diagnosis not present

## 2021-06-29 DIAGNOSIS — E785 Hyperlipidemia, unspecified: Secondary | ICD-10-CM | POA: Diagnosis not present

## 2021-06-29 DIAGNOSIS — N184 Chronic kidney disease, stage 4 (severe): Secondary | ICD-10-CM | POA: Diagnosis not present

## 2021-06-29 DIAGNOSIS — I129 Hypertensive chronic kidney disease with stage 1 through stage 4 chronic kidney disease, or unspecified chronic kidney disease: Secondary | ICD-10-CM | POA: Diagnosis not present

## 2021-06-29 DIAGNOSIS — K219 Gastro-esophageal reflux disease without esophagitis: Secondary | ICD-10-CM | POA: Diagnosis not present

## 2021-06-29 DIAGNOSIS — Z466 Encounter for fitting and adjustment of urinary device: Secondary | ICD-10-CM | POA: Diagnosis not present

## 2021-06-29 DIAGNOSIS — Z7984 Long term (current) use of oral hypoglycemic drugs: Secondary | ICD-10-CM | POA: Diagnosis not present

## 2021-06-29 DIAGNOSIS — M6281 Muscle weakness (generalized): Secondary | ICD-10-CM | POA: Diagnosis not present

## 2021-06-29 DIAGNOSIS — E1142 Type 2 diabetes mellitus with diabetic polyneuropathy: Secondary | ICD-10-CM | POA: Diagnosis not present

## 2021-07-06 DIAGNOSIS — I480 Paroxysmal atrial fibrillation: Secondary | ICD-10-CM | POA: Diagnosis not present

## 2021-07-06 DIAGNOSIS — E1142 Type 2 diabetes mellitus with diabetic polyneuropathy: Secondary | ICD-10-CM | POA: Diagnosis not present

## 2021-07-06 DIAGNOSIS — Z466 Encounter for fitting and adjustment of urinary device: Secondary | ICD-10-CM | POA: Diagnosis not present

## 2021-07-06 DIAGNOSIS — R339 Retention of urine, unspecified: Secondary | ICD-10-CM | POA: Diagnosis not present

## 2021-07-06 DIAGNOSIS — E1122 Type 2 diabetes mellitus with diabetic chronic kidney disease: Secondary | ICD-10-CM | POA: Diagnosis not present

## 2021-07-06 DIAGNOSIS — N184 Chronic kidney disease, stage 4 (severe): Secondary | ICD-10-CM | POA: Diagnosis not present

## 2021-07-06 NOTE — Progress Notes (Signed)
Remote pacemaker transmission.   

## 2021-07-12 DIAGNOSIS — I10 Essential (primary) hypertension: Secondary | ICD-10-CM | POA: Diagnosis not present

## 2021-07-12 DIAGNOSIS — I480 Paroxysmal atrial fibrillation: Secondary | ICD-10-CM | POA: Diagnosis not present

## 2021-07-12 DIAGNOSIS — E7800 Pure hypercholesterolemia, unspecified: Secondary | ICD-10-CM | POA: Diagnosis not present

## 2021-07-12 DIAGNOSIS — E1142 Type 2 diabetes mellitus with diabetic polyneuropathy: Secondary | ICD-10-CM | POA: Diagnosis not present

## 2021-07-12 DIAGNOSIS — E039 Hypothyroidism, unspecified: Secondary | ICD-10-CM | POA: Diagnosis not present

## 2021-07-20 DIAGNOSIS — E1142 Type 2 diabetes mellitus with diabetic polyneuropathy: Secondary | ICD-10-CM | POA: Diagnosis not present

## 2021-07-20 DIAGNOSIS — I480 Paroxysmal atrial fibrillation: Secondary | ICD-10-CM | POA: Diagnosis not present

## 2021-07-20 DIAGNOSIS — E1122 Type 2 diabetes mellitus with diabetic chronic kidney disease: Secondary | ICD-10-CM | POA: Diagnosis not present

## 2021-07-20 DIAGNOSIS — Z466 Encounter for fitting and adjustment of urinary device: Secondary | ICD-10-CM | POA: Diagnosis not present

## 2021-07-20 DIAGNOSIS — N184 Chronic kidney disease, stage 4 (severe): Secondary | ICD-10-CM | POA: Diagnosis not present

## 2021-07-20 DIAGNOSIS — R339 Retention of urine, unspecified: Secondary | ICD-10-CM | POA: Diagnosis not present

## 2021-07-27 DIAGNOSIS — R339 Retention of urine, unspecified: Secondary | ICD-10-CM | POA: Diagnosis not present

## 2021-07-27 DIAGNOSIS — Z466 Encounter for fitting and adjustment of urinary device: Secondary | ICD-10-CM | POA: Diagnosis not present

## 2021-07-27 DIAGNOSIS — I480 Paroxysmal atrial fibrillation: Secondary | ICD-10-CM | POA: Diagnosis not present

## 2021-07-27 DIAGNOSIS — E1122 Type 2 diabetes mellitus with diabetic chronic kidney disease: Secondary | ICD-10-CM | POA: Diagnosis not present

## 2021-07-27 DIAGNOSIS — E1142 Type 2 diabetes mellitus with diabetic polyneuropathy: Secondary | ICD-10-CM | POA: Diagnosis not present

## 2021-07-27 DIAGNOSIS — N184 Chronic kidney disease, stage 4 (severe): Secondary | ICD-10-CM | POA: Diagnosis not present

## 2021-07-29 DIAGNOSIS — Z7901 Long term (current) use of anticoagulants: Secondary | ICD-10-CM | POA: Diagnosis not present

## 2021-07-29 DIAGNOSIS — E1122 Type 2 diabetes mellitus with diabetic chronic kidney disease: Secondary | ICD-10-CM | POA: Diagnosis not present

## 2021-07-29 DIAGNOSIS — E1142 Type 2 diabetes mellitus with diabetic polyneuropathy: Secondary | ICD-10-CM | POA: Diagnosis not present

## 2021-07-29 DIAGNOSIS — Z7985 Long-term (current) use of injectable non-insulin antidiabetic drugs: Secondary | ICD-10-CM | POA: Diagnosis not present

## 2021-07-29 DIAGNOSIS — I129 Hypertensive chronic kidney disease with stage 1 through stage 4 chronic kidney disease, or unspecified chronic kidney disease: Secondary | ICD-10-CM | POA: Diagnosis not present

## 2021-07-29 DIAGNOSIS — Z466 Encounter for fitting and adjustment of urinary device: Secondary | ICD-10-CM | POA: Diagnosis not present

## 2021-07-29 DIAGNOSIS — I480 Paroxysmal atrial fibrillation: Secondary | ICD-10-CM | POA: Diagnosis not present

## 2021-07-29 DIAGNOSIS — N184 Chronic kidney disease, stage 4 (severe): Secondary | ICD-10-CM | POA: Diagnosis not present

## 2021-07-29 DIAGNOSIS — R339 Retention of urine, unspecified: Secondary | ICD-10-CM | POA: Diagnosis not present

## 2021-07-29 DIAGNOSIS — Z7984 Long term (current) use of oral hypoglycemic drugs: Secondary | ICD-10-CM | POA: Diagnosis not present

## 2021-07-29 DIAGNOSIS — Z5181 Encounter for therapeutic drug level monitoring: Secondary | ICD-10-CM | POA: Diagnosis not present

## 2021-07-29 DIAGNOSIS — N3946 Mixed incontinence: Secondary | ICD-10-CM | POA: Diagnosis not present

## 2021-08-08 DIAGNOSIS — H9193 Unspecified hearing loss, bilateral: Secondary | ICD-10-CM | POA: Diagnosis not present

## 2021-08-08 DIAGNOSIS — I480 Paroxysmal atrial fibrillation: Secondary | ICD-10-CM | POA: Diagnosis not present

## 2021-08-08 DIAGNOSIS — D696 Thrombocytopenia, unspecified: Secondary | ICD-10-CM | POA: Diagnosis not present

## 2021-08-08 DIAGNOSIS — K219 Gastro-esophageal reflux disease without esophagitis: Secondary | ICD-10-CM | POA: Diagnosis not present

## 2021-08-08 DIAGNOSIS — D649 Anemia, unspecified: Secondary | ICD-10-CM | POA: Diagnosis not present

## 2021-08-08 DIAGNOSIS — E1169 Type 2 diabetes mellitus with other specified complication: Secondary | ICD-10-CM | POA: Diagnosis not present

## 2021-08-08 DIAGNOSIS — E039 Hypothyroidism, unspecified: Secondary | ICD-10-CM | POA: Diagnosis not present

## 2021-08-08 DIAGNOSIS — I129 Hypertensive chronic kidney disease with stage 1 through stage 4 chronic kidney disease, or unspecified chronic kidney disease: Secondary | ICD-10-CM | POA: Diagnosis not present

## 2021-08-08 DIAGNOSIS — N3946 Mixed incontinence: Secondary | ICD-10-CM | POA: Diagnosis not present

## 2021-08-08 DIAGNOSIS — E7800 Pure hypercholesterolemia, unspecified: Secondary | ICD-10-CM | POA: Diagnosis not present

## 2021-08-08 DIAGNOSIS — R609 Edema, unspecified: Secondary | ICD-10-CM | POA: Diagnosis not present

## 2021-08-08 DIAGNOSIS — N184 Chronic kidney disease, stage 4 (severe): Secondary | ICD-10-CM | POA: Diagnosis not present

## 2021-08-11 DIAGNOSIS — Z466 Encounter for fitting and adjustment of urinary device: Secondary | ICD-10-CM | POA: Diagnosis not present

## 2021-08-11 DIAGNOSIS — R339 Retention of urine, unspecified: Secondary | ICD-10-CM | POA: Diagnosis not present

## 2021-08-15 DIAGNOSIS — E7800 Pure hypercholesterolemia, unspecified: Secondary | ICD-10-CM | POA: Diagnosis not present

## 2021-08-15 DIAGNOSIS — I10 Essential (primary) hypertension: Secondary | ICD-10-CM | POA: Diagnosis not present

## 2021-08-15 DIAGNOSIS — I480 Paroxysmal atrial fibrillation: Secondary | ICD-10-CM | POA: Diagnosis not present

## 2021-08-15 DIAGNOSIS — E039 Hypothyroidism, unspecified: Secondary | ICD-10-CM | POA: Diagnosis not present

## 2021-08-15 DIAGNOSIS — E1142 Type 2 diabetes mellitus with diabetic polyneuropathy: Secondary | ICD-10-CM | POA: Diagnosis not present

## 2021-09-13 DIAGNOSIS — I480 Paroxysmal atrial fibrillation: Secondary | ICD-10-CM | POA: Diagnosis not present

## 2021-09-13 DIAGNOSIS — I10 Essential (primary) hypertension: Secondary | ICD-10-CM | POA: Diagnosis not present

## 2021-09-13 DIAGNOSIS — E7800 Pure hypercholesterolemia, unspecified: Secondary | ICD-10-CM | POA: Diagnosis not present

## 2021-09-13 DIAGNOSIS — E039 Hypothyroidism, unspecified: Secondary | ICD-10-CM | POA: Diagnosis not present

## 2021-09-13 DIAGNOSIS — E1142 Type 2 diabetes mellitus with diabetic polyneuropathy: Secondary | ICD-10-CM | POA: Diagnosis not present

## 2021-09-20 ENCOUNTER — Ambulatory Visit (INDEPENDENT_AMBULATORY_CARE_PROVIDER_SITE_OTHER): Payer: Medicare HMO

## 2021-09-20 DIAGNOSIS — I495 Sick sinus syndrome: Secondary | ICD-10-CM

## 2021-09-20 LAB — CUP PACEART REMOTE DEVICE CHECK
Battery Remaining Longevity: 66 mo
Battery Voltage: 2.98 V
Brady Statistic AP VP Percent: 99.98 %
Brady Statistic AP VS Percent: 0 %
Brady Statistic AS VP Percent: 0 %
Brady Statistic AS VS Percent: 0.02 %
Brady Statistic RA Percent Paced: 100 %
Brady Statistic RV Percent Paced: 99.98 %
Date Time Interrogation Session: 20230725054623
Implantable Lead Implant Date: 19990907
Implantable Lead Implant Date: 19990907
Implantable Lead Location: 753859
Implantable Lead Location: 753860
Implantable Lead Serial Number: 32904
Implantable Lead Serial Number: 33421
Implantable Pulse Generator Implant Date: 20211014
Lead Channel Impedance Value: 228 Ohm
Lead Channel Impedance Value: 228 Ohm
Lead Channel Impedance Value: 247 Ohm
Lead Channel Impedance Value: 247 Ohm
Lead Channel Pacing Threshold Amplitude: 1.125 V
Lead Channel Pacing Threshold Pulse Width: 0.4 ms
Lead Channel Sensing Intrinsic Amplitude: 12 mV
Lead Channel Sensing Intrinsic Amplitude: 12 mV
Lead Channel Setting Pacing Amplitude: 2 V
Lead Channel Setting Pacing Amplitude: 2.5 V
Lead Channel Setting Pacing Pulse Width: 0.4 ms
Lead Channel Setting Sensing Sensitivity: 8 mV

## 2021-09-27 DIAGNOSIS — N3946 Mixed incontinence: Secondary | ICD-10-CM | POA: Diagnosis not present

## 2021-09-27 DIAGNOSIS — N184 Chronic kidney disease, stage 4 (severe): Secondary | ICD-10-CM | POA: Diagnosis not present

## 2021-09-27 DIAGNOSIS — Z7984 Long term (current) use of oral hypoglycemic drugs: Secondary | ICD-10-CM | POA: Diagnosis not present

## 2021-09-27 DIAGNOSIS — I480 Paroxysmal atrial fibrillation: Secondary | ICD-10-CM | POA: Diagnosis not present

## 2021-09-27 DIAGNOSIS — Z466 Encounter for fitting and adjustment of urinary device: Secondary | ICD-10-CM | POA: Diagnosis not present

## 2021-09-27 DIAGNOSIS — Z5181 Encounter for therapeutic drug level monitoring: Secondary | ICD-10-CM | POA: Diagnosis not present

## 2021-09-27 DIAGNOSIS — R339 Retention of urine, unspecified: Secondary | ICD-10-CM | POA: Diagnosis not present

## 2021-09-27 DIAGNOSIS — Z7901 Long term (current) use of anticoagulants: Secondary | ICD-10-CM | POA: Diagnosis not present

## 2021-09-27 DIAGNOSIS — Z7985 Long-term (current) use of injectable non-insulin antidiabetic drugs: Secondary | ICD-10-CM | POA: Diagnosis not present

## 2021-09-27 DIAGNOSIS — I129 Hypertensive chronic kidney disease with stage 1 through stage 4 chronic kidney disease, or unspecified chronic kidney disease: Secondary | ICD-10-CM | POA: Diagnosis not present

## 2021-09-27 DIAGNOSIS — E1122 Type 2 diabetes mellitus with diabetic chronic kidney disease: Secondary | ICD-10-CM | POA: Diagnosis not present

## 2021-09-27 DIAGNOSIS — E1142 Type 2 diabetes mellitus with diabetic polyneuropathy: Secondary | ICD-10-CM | POA: Diagnosis not present

## 2021-10-04 DIAGNOSIS — J069 Acute upper respiratory infection, unspecified: Secondary | ICD-10-CM | POA: Diagnosis not present

## 2021-10-04 DIAGNOSIS — N184 Chronic kidney disease, stage 4 (severe): Secondary | ICD-10-CM | POA: Diagnosis not present

## 2021-10-04 DIAGNOSIS — R051 Acute cough: Secondary | ICD-10-CM | POA: Diagnosis not present

## 2021-10-11 DIAGNOSIS — I10 Essential (primary) hypertension: Secondary | ICD-10-CM | POA: Diagnosis not present

## 2021-10-11 DIAGNOSIS — Z7901 Long term (current) use of anticoagulants: Secondary | ICD-10-CM | POA: Diagnosis not present

## 2021-10-11 DIAGNOSIS — I480 Paroxysmal atrial fibrillation: Secondary | ICD-10-CM | POA: Diagnosis not present

## 2021-10-11 DIAGNOSIS — J069 Acute upper respiratory infection, unspecified: Secondary | ICD-10-CM | POA: Diagnosis not present

## 2021-10-13 DIAGNOSIS — E1122 Type 2 diabetes mellitus with diabetic chronic kidney disease: Secondary | ICD-10-CM | POA: Diagnosis not present

## 2021-10-13 DIAGNOSIS — Z7901 Long term (current) use of anticoagulants: Secondary | ICD-10-CM | POA: Diagnosis not present

## 2021-10-13 DIAGNOSIS — J069 Acute upper respiratory infection, unspecified: Secondary | ICD-10-CM | POA: Diagnosis not present

## 2021-10-13 DIAGNOSIS — Z8673 Personal history of transient ischemic attack (TIA), and cerebral infarction without residual deficits: Secondary | ICD-10-CM | POA: Diagnosis not present

## 2021-10-13 DIAGNOSIS — E039 Hypothyroidism, unspecified: Secondary | ICD-10-CM | POA: Diagnosis not present

## 2021-10-13 DIAGNOSIS — E7800 Pure hypercholesterolemia, unspecified: Secondary | ICD-10-CM | POA: Diagnosis not present

## 2021-10-13 DIAGNOSIS — Z466 Encounter for fitting and adjustment of urinary device: Secondary | ICD-10-CM | POA: Diagnosis not present

## 2021-10-13 DIAGNOSIS — I480 Paroxysmal atrial fibrillation: Secondary | ICD-10-CM | POA: Diagnosis not present

## 2021-10-13 DIAGNOSIS — K219 Gastro-esophageal reflux disease without esophagitis: Secondary | ICD-10-CM | POA: Diagnosis not present

## 2021-10-13 DIAGNOSIS — Z7985 Long-term (current) use of injectable non-insulin antidiabetic drugs: Secondary | ICD-10-CM | POA: Diagnosis not present

## 2021-10-13 DIAGNOSIS — N189 Chronic kidney disease, unspecified: Secondary | ICD-10-CM | POA: Diagnosis not present

## 2021-10-13 DIAGNOSIS — I129 Hypertensive chronic kidney disease with stage 1 through stage 4 chronic kidney disease, or unspecified chronic kidney disease: Secondary | ICD-10-CM | POA: Diagnosis not present

## 2021-10-13 DIAGNOSIS — E785 Hyperlipidemia, unspecified: Secondary | ICD-10-CM | POA: Diagnosis not present

## 2021-10-13 DIAGNOSIS — Z5181 Encounter for therapeutic drug level monitoring: Secondary | ICD-10-CM | POA: Diagnosis not present

## 2021-10-13 DIAGNOSIS — Z792 Long term (current) use of antibiotics: Secondary | ICD-10-CM | POA: Diagnosis not present

## 2021-10-13 DIAGNOSIS — Z7984 Long term (current) use of oral hypoglycemic drugs: Secondary | ICD-10-CM | POA: Diagnosis not present

## 2021-10-13 DIAGNOSIS — Z85828 Personal history of other malignant neoplasm of skin: Secondary | ICD-10-CM | POA: Diagnosis not present

## 2021-10-13 DIAGNOSIS — I10 Essential (primary) hypertension: Secondary | ICD-10-CM | POA: Diagnosis not present

## 2021-10-17 NOTE — Progress Notes (Signed)
Remote pacemaker transmission.   

## 2021-10-25 DIAGNOSIS — E11319 Type 2 diabetes mellitus with unspecified diabetic retinopathy without macular edema: Secondary | ICD-10-CM | POA: Diagnosis not present

## 2021-10-27 DIAGNOSIS — Z7901 Long term (current) use of anticoagulants: Secondary | ICD-10-CM | POA: Diagnosis not present

## 2021-10-27 DIAGNOSIS — Z5181 Encounter for therapeutic drug level monitoring: Secondary | ICD-10-CM | POA: Diagnosis not present

## 2021-10-27 DIAGNOSIS — Z792 Long term (current) use of antibiotics: Secondary | ICD-10-CM | POA: Diagnosis not present

## 2021-10-27 DIAGNOSIS — Z466 Encounter for fitting and adjustment of urinary device: Secondary | ICD-10-CM | POA: Diagnosis not present

## 2021-10-27 DIAGNOSIS — I480 Paroxysmal atrial fibrillation: Secondary | ICD-10-CM | POA: Diagnosis not present

## 2021-10-27 DIAGNOSIS — J069 Acute upper respiratory infection, unspecified: Secondary | ICD-10-CM | POA: Diagnosis not present

## 2021-11-07 DIAGNOSIS — J069 Acute upper respiratory infection, unspecified: Secondary | ICD-10-CM | POA: Diagnosis not present

## 2021-11-07 DIAGNOSIS — Z792 Long term (current) use of antibiotics: Secondary | ICD-10-CM | POA: Diagnosis not present

## 2021-11-07 DIAGNOSIS — I480 Paroxysmal atrial fibrillation: Secondary | ICD-10-CM | POA: Diagnosis not present

## 2021-11-07 DIAGNOSIS — Z7901 Long term (current) use of anticoagulants: Secondary | ICD-10-CM | POA: Diagnosis not present

## 2021-11-07 DIAGNOSIS — Z466 Encounter for fitting and adjustment of urinary device: Secondary | ICD-10-CM | POA: Diagnosis not present

## 2021-11-07 DIAGNOSIS — Z5181 Encounter for therapeutic drug level monitoring: Secondary | ICD-10-CM | POA: Diagnosis not present

## 2021-11-08 DIAGNOSIS — D696 Thrombocytopenia, unspecified: Secondary | ICD-10-CM | POA: Diagnosis not present

## 2021-11-08 DIAGNOSIS — N184 Chronic kidney disease, stage 4 (severe): Secondary | ICD-10-CM | POA: Diagnosis not present

## 2021-11-08 DIAGNOSIS — R5381 Other malaise: Secondary | ICD-10-CM | POA: Diagnosis not present

## 2021-11-08 DIAGNOSIS — E039 Hypothyroidism, unspecified: Secondary | ICD-10-CM | POA: Diagnosis not present

## 2021-11-08 DIAGNOSIS — N3946 Mixed incontinence: Secondary | ICD-10-CM | POA: Diagnosis not present

## 2021-11-08 DIAGNOSIS — E7800 Pure hypercholesterolemia, unspecified: Secondary | ICD-10-CM | POA: Diagnosis not present

## 2021-11-08 DIAGNOSIS — D6869 Other thrombophilia: Secondary | ICD-10-CM | POA: Diagnosis not present

## 2021-11-08 DIAGNOSIS — I480 Paroxysmal atrial fibrillation: Secondary | ICD-10-CM | POA: Diagnosis not present

## 2021-11-08 DIAGNOSIS — I129 Hypertensive chronic kidney disease with stage 1 through stage 4 chronic kidney disease, or unspecified chronic kidney disease: Secondary | ICD-10-CM | POA: Diagnosis not present

## 2021-11-08 DIAGNOSIS — R609 Edema, unspecified: Secondary | ICD-10-CM | POA: Diagnosis not present

## 2021-11-08 DIAGNOSIS — D649 Anemia, unspecified: Secondary | ICD-10-CM | POA: Diagnosis not present

## 2021-11-08 DIAGNOSIS — E1169 Type 2 diabetes mellitus with other specified complication: Secondary | ICD-10-CM | POA: Diagnosis not present

## 2021-11-10 DIAGNOSIS — I10 Essential (primary) hypertension: Secondary | ICD-10-CM | POA: Diagnosis not present

## 2021-11-10 DIAGNOSIS — E7800 Pure hypercholesterolemia, unspecified: Secondary | ICD-10-CM | POA: Diagnosis not present

## 2021-11-10 DIAGNOSIS — E1122 Type 2 diabetes mellitus with diabetic chronic kidney disease: Secondary | ICD-10-CM | POA: Diagnosis not present

## 2021-11-10 DIAGNOSIS — I480 Paroxysmal atrial fibrillation: Secondary | ICD-10-CM | POA: Diagnosis not present

## 2021-11-10 DIAGNOSIS — I4891 Unspecified atrial fibrillation: Secondary | ICD-10-CM | POA: Diagnosis not present

## 2021-11-10 DIAGNOSIS — N2581 Secondary hyperparathyroidism of renal origin: Secondary | ICD-10-CM | POA: Diagnosis not present

## 2021-11-10 DIAGNOSIS — I129 Hypertensive chronic kidney disease with stage 1 through stage 4 chronic kidney disease, or unspecified chronic kidney disease: Secondary | ICD-10-CM | POA: Diagnosis not present

## 2021-11-10 DIAGNOSIS — D631 Anemia in chronic kidney disease: Secondary | ICD-10-CM | POA: Diagnosis not present

## 2021-11-10 DIAGNOSIS — N189 Chronic kidney disease, unspecified: Secondary | ICD-10-CM | POA: Diagnosis not present

## 2021-11-10 DIAGNOSIS — N184 Chronic kidney disease, stage 4 (severe): Secondary | ICD-10-CM | POA: Diagnosis not present

## 2021-11-12 DIAGNOSIS — E1122 Type 2 diabetes mellitus with diabetic chronic kidney disease: Secondary | ICD-10-CM | POA: Diagnosis not present

## 2021-11-12 DIAGNOSIS — Z792 Long term (current) use of antibiotics: Secondary | ICD-10-CM | POA: Diagnosis not present

## 2021-11-12 DIAGNOSIS — Z5181 Encounter for therapeutic drug level monitoring: Secondary | ICD-10-CM | POA: Diagnosis not present

## 2021-11-12 DIAGNOSIS — I480 Paroxysmal atrial fibrillation: Secondary | ICD-10-CM | POA: Diagnosis not present

## 2021-11-12 DIAGNOSIS — E039 Hypothyroidism, unspecified: Secondary | ICD-10-CM | POA: Diagnosis not present

## 2021-11-12 DIAGNOSIS — Z8673 Personal history of transient ischemic attack (TIA), and cerebral infarction without residual deficits: Secondary | ICD-10-CM | POA: Diagnosis not present

## 2021-11-12 DIAGNOSIS — K219 Gastro-esophageal reflux disease without esophagitis: Secondary | ICD-10-CM | POA: Diagnosis not present

## 2021-11-12 DIAGNOSIS — J069 Acute upper respiratory infection, unspecified: Secondary | ICD-10-CM | POA: Diagnosis not present

## 2021-11-12 DIAGNOSIS — Z466 Encounter for fitting and adjustment of urinary device: Secondary | ICD-10-CM | POA: Diagnosis not present

## 2021-11-12 DIAGNOSIS — Z7901 Long term (current) use of anticoagulants: Secondary | ICD-10-CM | POA: Diagnosis not present

## 2021-11-12 DIAGNOSIS — N189 Chronic kidney disease, unspecified: Secondary | ICD-10-CM | POA: Diagnosis not present

## 2021-11-12 DIAGNOSIS — Z7985 Long-term (current) use of injectable non-insulin antidiabetic drugs: Secondary | ICD-10-CM | POA: Diagnosis not present

## 2021-11-12 DIAGNOSIS — Z7984 Long term (current) use of oral hypoglycemic drugs: Secondary | ICD-10-CM | POA: Diagnosis not present

## 2021-11-12 DIAGNOSIS — I129 Hypertensive chronic kidney disease with stage 1 through stage 4 chronic kidney disease, or unspecified chronic kidney disease: Secondary | ICD-10-CM | POA: Diagnosis not present

## 2021-11-12 DIAGNOSIS — E785 Hyperlipidemia, unspecified: Secondary | ICD-10-CM | POA: Diagnosis not present

## 2021-11-12 DIAGNOSIS — Z85828 Personal history of other malignant neoplasm of skin: Secondary | ICD-10-CM | POA: Diagnosis not present

## 2021-11-17 ENCOUNTER — Telehealth: Payer: Self-pay | Admitting: *Deleted

## 2021-11-17 NOTE — Chronic Care Management (AMB) (Signed)
  Care Coordination   Note   11/17/2021 Name: Tiffany [name] MRN: [id number] DOB: 1931/06/29  [name] is a 88 y.o. year old female who sees Antony Contras, MD for primary care. I reached out to [place] by phone today to offer care coordination services.  [name] was given information about Care Coordination services today including:   The Care Coordination services include support from the care team which includes your Nurse Coordinator, Clinical Social Worker, or Pharmacist.  The Care Coordination team is here to help remove barriers to the health concerns and goals most important to you. Care Coordination services are voluntary, and the patient may decline or stop services at any time by request to their care team member.   Care Coordination Consent Status: Patient agreed to services and verbal consent obtained.   Follow up plan:  Telephone appointment with care coordination team member scheduled for:  11/22/21  Encounter Outcome:  Pt. Scheduled  [place]  Direct Dial: [phone]

## 2021-11-22 DIAGNOSIS — Z7901 Long term (current) use of anticoagulants: Secondary | ICD-10-CM | POA: Diagnosis not present

## 2021-11-22 DIAGNOSIS — Z466 Encounter for fitting and adjustment of urinary device: Secondary | ICD-10-CM | POA: Diagnosis not present

## 2021-11-22 DIAGNOSIS — J069 Acute upper respiratory infection, unspecified: Secondary | ICD-10-CM | POA: Diagnosis not present

## 2021-11-22 DIAGNOSIS — Z792 Long term (current) use of antibiotics: Secondary | ICD-10-CM | POA: Diagnosis not present

## 2021-11-22 DIAGNOSIS — I480 Paroxysmal atrial fibrillation: Secondary | ICD-10-CM | POA: Diagnosis not present

## 2021-11-22 DIAGNOSIS — Z5181 Encounter for therapeutic drug level monitoring: Secondary | ICD-10-CM | POA: Diagnosis not present

## 2021-11-23 ENCOUNTER — Emergency Department (HOSPITAL_COMMUNITY): Payer: Medicare Other

## 2021-11-23 ENCOUNTER — Emergency Department (HOSPITAL_COMMUNITY)
Admission: EM | Admit: 2021-11-23 | Discharge: 2021-11-23 | Disposition: A | Discharge status: Home / Self Care | Payer: Medicare Other | Attending: Emergency Medicine | Admitting: Emergency Medicine

## 2021-11-23 ENCOUNTER — Encounter (HOSPITAL_COMMUNITY): Payer: Self-pay | Admitting: Emergency Medicine

## 2021-11-23 DIAGNOSIS — S0990XA Unspecified injury of head, initial encounter: Secondary | ICD-10-CM | POA: Diagnosis present

## 2021-11-23 DIAGNOSIS — W0110XA Fall on same level from slipping, tripping and stumbling with subsequent striking against unspecified object, initial encounter: Secondary | ICD-10-CM | POA: Insufficient documentation

## 2021-11-23 DIAGNOSIS — Y92000 Kitchen of unspecified non-institutional (private) residence as  the place of occurrence of the external cause: Secondary | ICD-10-CM | POA: Diagnosis not present

## 2021-11-23 DIAGNOSIS — E038 Other specified hypothyroidism: Secondary | ICD-10-CM

## 2021-11-23 DIAGNOSIS — I129 Hypertensive chronic kidney disease with stage 1 through stage 4 chronic kidney disease, or unspecified chronic kidney disease: Secondary | ICD-10-CM | POA: Diagnosis not present

## 2021-11-23 DIAGNOSIS — R262 Difficulty in walking, not elsewhere classified: Secondary | ICD-10-CM | POA: Diagnosis not present

## 2021-11-23 DIAGNOSIS — Y92009 Unspecified place in unspecified non-institutional (private) residence as the place of occurrence of the external cause: Secondary | ICD-10-CM | POA: Diagnosis not present

## 2021-11-23 DIAGNOSIS — I959 Hypotension, unspecified: Secondary | ICD-10-CM | POA: Diagnosis not present

## 2021-11-23 DIAGNOSIS — Z79899 Other long term (current) drug therapy: Secondary | ICD-10-CM | POA: Insufficient documentation

## 2021-11-23 DIAGNOSIS — R531 Weakness: Secondary | ICD-10-CM | POA: Diagnosis not present

## 2021-11-23 DIAGNOSIS — E1122 Type 2 diabetes mellitus with diabetic chronic kidney disease: Secondary | ICD-10-CM | POA: Diagnosis not present

## 2021-11-23 DIAGNOSIS — S0083XA Contusion of other part of head, initial encounter: Secondary | ICD-10-CM | POA: Diagnosis not present

## 2021-11-23 DIAGNOSIS — N189 Chronic kidney disease, unspecified: Secondary | ICD-10-CM | POA: Insufficient documentation

## 2021-11-23 DIAGNOSIS — Z7984 Long term (current) use of oral hypoglycemic drugs: Secondary | ICD-10-CM | POA: Insufficient documentation

## 2021-11-23 DIAGNOSIS — R519 Headache, unspecified: Secondary | ICD-10-CM | POA: Diagnosis not present

## 2021-11-23 DIAGNOSIS — W19XXXA Unspecified fall, initial encounter: Secondary | ICD-10-CM

## 2021-11-23 DIAGNOSIS — E039 Hypothyroidism, unspecified: Secondary | ICD-10-CM | POA: Insufficient documentation

## 2021-11-23 DIAGNOSIS — S199XXA Unspecified injury of neck, initial encounter: Secondary | ICD-10-CM | POA: Diagnosis not present

## 2021-11-23 DIAGNOSIS — Y9301 Activity, walking, marching and hiking: Secondary | ICD-10-CM | POA: Diagnosis not present

## 2021-11-23 DIAGNOSIS — R29818 Other symptoms and signs involving the nervous system: Secondary | ICD-10-CM | POA: Diagnosis not present

## 2021-11-23 DIAGNOSIS — J3489 Other specified disorders of nose and nasal sinuses: Secondary | ICD-10-CM | POA: Diagnosis not present

## 2021-11-23 DIAGNOSIS — I4891 Unspecified atrial fibrillation: Secondary | ICD-10-CM | POA: Diagnosis not present

## 2021-11-23 DIAGNOSIS — I10 Essential (primary) hypertension: Secondary | ICD-10-CM | POA: Diagnosis not present

## 2021-11-23 LAB — COMPREHENSIVE METABOLIC PANEL
ALT: 20 U/L (ref 0–44)
AST: 26 U/L (ref 15–41)
Albumin: 3.3 g/dL — ABNORMAL LOW (ref 3.5–5.0)
Alkaline Phosphatase: 63 U/L (ref 38–126)
Anion gap: 12 (ref 5–15)
BUN: 40 mg/dL — ABNORMAL HIGH (ref 8–23)
CO2: 23 mmol/L (ref 22–32)
Calcium: 9.2 mg/dL (ref 8.9–10.3)
Chloride: 106 mmol/L (ref 98–111)
Creatinine, Ser: 2.11 mg/dL — ABNORMAL HIGH (ref 0.44–1.00)
GFR, Estimated: 22 mL/min — ABNORMAL LOW (ref >60)
Glucose, Bld: 162 mg/dL — ABNORMAL HIGH (ref 70–99)
Potassium: 4 mmol/L (ref 3.5–5.1)
Sodium: 141 mmol/L (ref 135–145)
Total Bilirubin: 1.1 mg/dL (ref 0.3–1.2)
Total Protein: 7.4 g/dL (ref 6.5–8.1)

## 2021-11-23 LAB — DIFFERENTIAL
Abs Immature Granulocytes: 0.02 10*3/uL (ref 0.00–0.07)
Basophils Absolute: 0 10*3/uL (ref 0.0–0.1)
Basophils Relative: 1 %
Eosinophils Absolute: 0.4 10*3/uL (ref 0.0–0.5)
Eosinophils Relative: 9 %
Immature Granulocytes: 0 %
Lymphocytes Relative: 23 %
Lymphs Abs: 1.1 10*3/uL (ref 0.7–4.0)
Monocytes Absolute: 0.4 10*3/uL (ref 0.1–1.0)
Monocytes Relative: 7 %
Neutro Abs: 3.1 10*3/uL (ref 1.7–7.7)
Neutrophils Relative %: 60 %

## 2021-11-23 LAB — CBC
HCT: 31.6 % — ABNORMAL LOW (ref 36.0–46.0)
Hemoglobin: 10.1 g/dL — ABNORMAL LOW (ref 12.0–15.0)
MCH: 27.7 pg (ref 26.0–34.0)
MCHC: 32 g/dL (ref 30.0–36.0)
MCV: 86.8 fL (ref 80.0–100.0)
Platelets: 160 10*3/uL (ref 150–400)
RBC: 3.64 MIL/uL — ABNORMAL LOW (ref 3.87–5.11)
RDW: 16.5 % — ABNORMAL HIGH (ref 11.5–15.5)
WBC: 5.1 10*3/uL (ref 4.0–10.5)
nRBC: 0 % (ref 0.0–0.2)

## 2021-11-23 LAB — I-STAT CHEM 8, ED
BUN: 36 mg/dL — ABNORMAL HIGH (ref 8–23)
Calcium, Ion: 1.14 mmol/L — ABNORMAL LOW (ref 1.15–1.40)
Chloride: 106 mmol/L (ref 98–111)
Creatinine, Ser: 2.1 mg/dL — ABNORMAL HIGH (ref 0.44–1.00)
Glucose, Bld: 158 mg/dL — ABNORMAL HIGH (ref 70–99)
HCT: 31 % — ABNORMAL LOW (ref 36.0–46.0)
Hemoglobin: 10.5 g/dL — ABNORMAL LOW (ref 12.0–15.0)
Potassium: 4 mmol/L (ref 3.5–5.1)
Sodium: 142 mmol/L (ref 135–145)
TCO2: 25 mmol/L (ref 22–32)

## 2021-11-23 LAB — PROTIME-INR
INR: 3.4 — ABNORMAL HIGH (ref 0.8–1.2)
Prothrombin Time: 34.1 seconds — ABNORMAL HIGH (ref 11.4–15.2)

## 2021-11-23 LAB — APTT: aPTT: 48 seconds — ABNORMAL HIGH (ref 24–36)

## 2021-11-23 LAB — CBG MONITORING, ED: Glucose-Capillary: 160 mg/dL — ABNORMAL HIGH (ref 70–99)

## 2021-11-23 LAB — ETHANOL: Alcohol, Ethyl (B): <10 mg/dL (ref <10)

## 2021-11-23 MED ORDER — ACETAMINOPHEN 500 MG PO TABS
1000.0000 mg | ORAL_TABLET | Freq: Once | ORAL | Status: AC
  Administered 2021-11-23: 1000 mg via ORAL
  Filled 2021-11-23: qty 2

## 2021-11-23 MED ORDER — SODIUM CHLORIDE 0.9% FLUSH
3.0000 mL | Freq: Once | INTRAVENOUS | Status: AC
  Administered 2021-11-23: 3 mL via INTRAVENOUS

## 2021-11-23 NOTE — Progress Notes (Signed)
Orthopedic Tech Progress Note Patient Details:  Tiffany [name] September 12, 1931 [id number]  Level 2 trauma   Patient ID: Tiffany [name], female   DOB: 1931/04/30, 88 y.o.   MRN: [id number]  Tiffany [name] 11/23/2021, 1:43 PM

## 2021-11-23 NOTE — Discharge Instructions (Addendum)
The CAT scan today showed no sign of internal bleeding to your brain.  You are going to have a lot of bruising of your face for the next week or 2.  Your last Coumadin level today was 3.4.  Continue all your medications the way you have been taking them.  Use Tylenol as needed for aches and pains.  If you start having confusion, a lot of vomiting, severe headache or any further falls you should return to the emergency room.

## 2021-11-23 NOTE — ED Notes (Signed)
Daughter updated on plan of care. Son in law will be here in an hour and half.

## 2021-11-23 NOTE — ED Notes (Signed)
So will be here in a hour 1/2

## 2021-11-23 NOTE — ED Notes (Signed)
Trauma Response Nurse Documentation   Tiffany [name] is a 88 y.o. female arriving to [facility] ED via EMS  On warfarin daily. Trauma was activated as a Level 2 by ED Charge RN based on the following trauma criteria Elderly patients > 65 with head trauma on anti-coagulation (excluding ASA). Trauma team at the bedside on patient arrival.   Patient cleared for CT by Dr. Maryan Rued. Pt transported to CT with trauma response nurse present to monitor. RN remained with the patient throughout their absence from the department for clinical observation.   GCS 15.  History   Past Medical History:  Diagnosis Date   Chronic kidney disease    Diverticulosis    DM2 (diabetes mellitus, type 2) ([place])    Esophageal reflux    HTN (hypertension)    Hyperlipidemia    Hypothyroidism    Microalbuminuria    last done in 1/07    Paroxysmal atrial fibrillation ([facility])    and atrial tachycardia   Presence of permanent cardiac pacemaker    Stroke ([place]) 1999   Tachycardia-bradycardia syndrome ([place])    s/p PPM   Unspecified hypothyroidism 11/20/2013     Past Surgical History:  Procedure Laterality Date   CHOLECYSTECTOMY     COLONOSCOPY N/A 11/21/2013   Procedure: COLONOSCOPY;  Surgeon: Jeryl Columbia, MD;  Location: [facility] ENDOSCOPY;  Service: Endoscopy;  Laterality: N/A;   ESOPHAGOGASTRODUODENOSCOPY (EGD) WITH PROPOFOL N/A 05/26/2020   Procedure: ESOPHAGOGASTRODUODENOSCOPY (EGD) WITH PROPOFOL;  Surgeon: Ronnette Juniper, MD;  Location: [facility] ENDOSCOPY;  Service: Gastroenterology;  Laterality: N/A;   HEMORRHOID SURGERY     hysterectomy (other)     PACEMAKER GENERATOR CHANGE  10/03/12   DMT Advisa DR gen change by Dr Rayann Heman   PACEMAKER GENERATOR CHANGE N/A 10/03/2012   Procedure: PACEMAKER GENERATOR CHANGE;  Surgeon: Thompson Grayer, MD;  Location: [facility] CATH LAB;  Service: Cardiovascular;  Laterality: N/A;   permanent pacemaker  1999   updated 2006 (MDT) by Dr Verlon Setting; gen change 09-2012 by Dr Rayann Heman (MDT)   [place] N/A 12/11/2019   Procedure: PPM GENERATOR CHANGEOUT;  Surgeon: Thompson Grayer, MD;  Location: [facility] CV LAB;  Service: Cardiovascular;  Laterality: N/A;      Initial Focused Assessment (If applicable, or please see trauma documentation): - GCS 15 - PERRLA 2's brisk - C-collar in place - 18G PIV L FA - Large hematoma to R forehead with bruising  CT's Completed:   CT Head and CT C-Spine   Interventions:  - Labs drawn - CT head and c-spine  Plan for disposition:  Other   Consults completed:  none at 1500.  Event Summary: Pt arrives via EMS from home with LSN 1230. Pt had mechanical fall and EMS reports weakness to right leg and arm. Pt has large hematoma to right side of head.  Bedside handoff with ED RN Burman Nieves.    Clovis Cao  Trauma Response RN  Please call TRN at [phone] for further assistance.

## 2021-11-23 NOTE — Code Documentation (Signed)
Stroke Response Nurse Documentation Code Documentation  Tiffany [name] is a 88 y.o. female arriving to [facility]  via [place] on 11/23/21 with past medical hx of Afib, DM, HTN. On warfarin daily. Code stroke was activated by EMS.   Patient from home where she was LKW at 1230 when she had a fall and hit her head. She was brought in due to EMS noticing right sided weakness. Upon arrival no deficits noticed.   Stroke team at the bedside on patient arrival. Labs drawn and patient cleared for CT by EDP. Patient to CT with team. NIHSS 0, see documentation for details and code stroke times. Code stroke cancelled at 1358.   Bedside handoff with ED RN .    Meda Klinefelter  Stroke Response RN

## 2021-11-23 NOTE — ED Provider Notes (Signed)
[facility] EMERGENCY DEPARTMENT Provider Note   CSN: [id number] Arrival date & time: 11/23/21  1334     History  Chief Complaint  Patient presents with   Code Stroke   Fall    Tiffany [name] is a 88 y.o. female.  Patient is an 88 year old female with a history of paroxysmal atrial fibrillation on Coumadin, CKD, hypothyroidism, hypertension, diabetes, prior stroke who is presenting today initially as a code stroke.  Patient reports that at 1230 she was walking around her house and she was feeling fine.  She had gone into the kitchen to get something out of the cabinet when she lost her balance and fell hitting her head on the floor.  She was able to call 911 after the fall but they felt that patient had some right-sided deficits.  They denied any speech changes or LVO signs.  Her vital signs of been stable.  Here patient denies any neck pain but does report a headache where she has the swelling in her forehead.  She denies any visual changes or unilateral numbness or weakness.  She reports feeling fine this morning.  She has no chest pain, abdominal pain or shortness of breath.  She was also complaining of some pain in her left leg which she reports is related to being positioned on the stretcher  The history is provided by the patient.       Home Medications Prior to Admission medications   Medication Sig Start Date End Date Taking? Authorizing Provider  diltiazem (TIAZAC) 120 MG 24 hr capsule Take 120 mg by mouth daily.    [provider]  FERGON 240 (27 FE) MG tablet Take 240 mg by mouth in the morning and at bedtime. 10/31/14   [provider]  furosemide (LASIX) 20 MG tablet Take 20 mg by mouth daily.    [provider]  glipiZIDE (GLUCOTROL) 5 MG tablet Take 5 mg by mouth daily before breakfast.     [provider]  levothyroxine (SYNTHROID, LEVOTHROID) 50 MCG tablet Take 50 mcg by mouth daily before breakfast.      [provider]  Multiple Vitamins-Minerals (MULTIVITAMIN WITH MINERALS) tablet Take 1 tablet by mouth daily.    [provider]  OZEMPIC, 1 MG/DOSE, 4 MG/3ML SOPN Inject 1 mg into the skin once a week. 04/29/20   [provider]  potassium chloride SA (K-DUR,KLOR-CON) 20 MEQ tablet Take 20 mEq by mouth daily.    [provider]  rosuvastatin (CRESTOR) 10 MG tablet Take 10 mg by mouth daily.    [provider]  warfarin (COUMADIN) 2 MG tablet Take 2 mg by mouth daily.    [provider]      Allergies    Other and Sitagliptin    Review of Systems   Review of Systems  Physical Exam Updated Vital Signs BP 129/74   Pulse (!) 59   Temp 98 F (36.7 C) (Oral)   Resp 19   Ht 5' 2.5" (1.588 m)   SpO2 95%   BMI 27.29 kg/m  Physical Exam Vitals and nursing note reviewed.  Constitutional:      General: She is in acute distress.     Appearance: She is well-developed.  HENT:     Head: Normocephalic.   Eyes:     General: No visual field deficit.    Extraocular Movements: Extraocular movements intact.     Pupils: Pupils are equal, round, and reactive to light.  Comments: 3 mm and reactive bilaterally  Cardiovascular:     Rate and Rhythm: Normal rate and regular rhythm.     Heart sounds: Normal heart sounds. No murmur heard.    No friction rub.  Pulmonary:     Effort: Pulmonary effort is normal.     Breath sounds: Normal breath sounds. No wheezing or rales.  Abdominal:     General: Bowel sounds are normal. There is no distension.     Palpations: Abdomen is soft.     Tenderness: There is no abdominal tenderness. There is no guarding or rebound.  Musculoskeletal:        General: No tenderness. Normal range of motion.     Cervical back: Normal range of motion and neck supple. No tenderness.     Comments: No edema  Skin:    General: Skin is warm and dry.     Findings: No rash.  Neurological:     Mental Status: She is alert  and oriented to person, place, and time. Mental status is at baseline.     Cranial Nerves: No cranial nerve deficit, dysarthria or facial asymmetry.     Sensory: Sensation is intact. No sensory deficit.     Motor: Motor function is intact. No weakness or pronator drift.     Coordination: Coordination is intact. Coordination normal.     Comments: Normal object naming.  No aphasia  Psychiatric:        Behavior: Behavior normal.     ED Results / Procedures / Treatments   Labs (all labs ordered are listed, but only abnormal results are displayed) Labs Reviewed  PROTIME-INR - Abnormal; Notable for the following components:      Result Value   Prothrombin Time 34.1 (*)    INR 3.4 (*)    All other components within normal limits  APTT - Abnormal; Notable for the following components:   aPTT 48 (*)    All other components within normal limits  CBC - Abnormal; Notable for the following components:   RBC 3.64 (*)    Hemoglobin 10.1 (*)    HCT 31.6 (*)    RDW 16.5 (*)    All other components within normal limits  COMPREHENSIVE METABOLIC PANEL - Abnormal; Notable for the following components:   Glucose, Bld 162 (*)    BUN 40 (*)    Creatinine, Ser 2.11 (*)    Albumin 3.3 (*)    GFR, Estimated 22 (*)    All other components within normal limits  I-STAT CHEM 8, ED - Abnormal; Notable for the following components:   BUN 36 (*)    Creatinine, Ser 2.10 (*)    Glucose, Bld 158 (*)    Calcium, Ion 1.14 (*)    Hemoglobin 10.5 (*)    HCT 31.0 (*)    All other components within normal limits  CBG MONITORING, ED - Abnormal; Notable for the following components:   Glucose-Capillary 160 (*)    All other components within normal limits  DIFFERENTIAL  ETHANOL    EKG EKG Interpretation  Date/Time:  Wednesday November 23 2021 13:58:46 EDT Ventricular Rate:  70 PR Interval:    QRS Duration: 165 QT Interval:  453 QTC Calculation: 489 R Axis:   -45 Text Interpretation: VENTRICULAR PACED  RHYTHM Left bundle branch block Confirmed by Blanchie Dessert [phone]) on 11/23/2021 2:07:24 PM  Radiology CT Cervical Spine Wo Contrast  Result Date: 11/23/2021 CLINICAL DATA:  Facial trauma, blunt EXAM: CT CERVICAL SPINE WITHOUT  CONTRAST TECHNIQUE: Multidetector CT imaging of the cervical spine was performed without intravenous contrast. Multiplanar CT image reconstructions were also generated. RADIATION DOSE REDUCTION: This exam was performed according to the departmental dose-optimization program which includes automated exposure control, adjustment of the mA and/or kV according to patient size and/or use of iterative reconstruction technique. COMPARISON:  None Available. FINDINGS: Alignment: Straightening.  No substantial sagittal subluxation. Skull base and vertebrae: Mild height loss of the T1-T2 vertebral bodies appears similar to the prior. Otherwise, vertebral body heights are maintained. No evidence of acute fracture. Soft tissues and spinal canal: No prevertebral fluid or swelling. No visible canal hematoma. Disc levels: Multilevel degenerative disease. Multilevel facet and uncovertebral hypertrophy with varying degrees of neural foraminal stenosis Upper chest: Visualized lung apices are clear. IMPRESSION: No evidence of acute fracture or traumatic malalignment. Electronically Signed   By: Margaretha Sheffield M.D.   On: 11/23/2021 13:58   CT HEAD CODE STROKE WO CONTRAST  Result Date: 11/23/2021 CLINICAL DATA:  Code stroke.  Neuro deficit, acute, stroke suspected EXAM: CT HEAD WITHOUT CONTRAST TECHNIQUE: Contiguous axial images were obtained from the base of the skull through the vertex without intravenous contrast. RADIATION DOSE REDUCTION: This exam was performed according to the departmental dose-optimization program which includes automated exposure control, adjustment of the mA and/or kV according to patient size and/or use of iterative reconstruction technique. COMPARISON:  CT head August 16, 2017. FINDINGS: Brain: Remote right anterior MCA territory infarct. No evidence of acute large vascular territory infarct. No evidence of acute hemorrhage, mass lesion, midline shift, or hydrocephalus. No midline shift. Vascular: No hyperdense vessel identified. Calcific intracranial atherosclerosis. Skull: Large right forehead/periorbital contusion without calvarial fracture. Sinuses/Orbits: Paranasal sinus mucosal thickening. No acute orbital findings. Other: Small right mastoid effusion. ASPECTS [facility] Stroke Program Early CT Score) total score (0-10 with 10 being normal): 10. IMPRESSION: 1. No evidence of acute intracranial abnormality.  ASPECTS is 10. 2. Remote right anterior MCA territory infarct. 3. Large right forehead/periorbital contusion. Code stroke imaging results were communicated on 11/23/2021 at 1:54 pm to provider [facility] via secure text paging. Electronically Signed   By: Margaretha Sheffield M.D.   On: 11/23/2021 13:54    Procedures Procedures    Medications Ordered in ED Medications  sodium chloride flush (NS) 0.9 % injection 3 mL (3 mLs Intravenous Given 11/23/21 1426)  acetaminophen (TYLENOL) tablet 1,000 mg (1,000 mg Oral Given 11/23/21 1500)    ED Course/ Medical Decision Making/ A&P                           Medical Decision Making Amount and/or Complexity of Data Reviewed Labs: ordered. Decision-making details documented in ED Course. Radiology: ordered and independent interpretation performed. Decision-making details documented in ED Course.  Risk OTC drugs.   Pt with multiple medical problems and comorbidities and presenting today with a complaint that caries a high risk for morbidity and mortality.  Patient presenting today initially as a code stroke due to concern for right-sided weakness after a fall at home.  Patient does have significant hematoma over the right forehead but is awake and alert.  Upon arrival to the emergency room patient did not have any  significant deficits on exam.  She was made a level 2 trauma as patient is on Coumadin and had a fall and head injury.  She is neurovascularly intact at this time.  Neurology was at bedside upon patient arrival.  Lower concern for  stroke at this time as patient can report that she lost her balance and fell forward hitting her head denies loss of consciousness or presyncopal symptoms.  Will check INR, CT of the head and labs.  3:46 PM I independently interpreted patient's labs and EKG.  EKG with paced rhythm but no other acute findings.  INR is therapeutic at 3.4, CBC with stable white count of 5 and hemoglobin improved to 10 from baseline of 8, CMP with creatinine of 2.11 which is no different than her baseline and normal electrolytes. I have independently visualized and interpreted pt's images today.  Head CT is negative for intracranial hemorrhage today and focal spine without fracture.  Hematoma is present in the right forehead.  Repeat evaluation patient is complaining of a mild headache and was given Tylenol.  Will ensure that patient can ambulate.  At this time Dr. Tilman Neat downgraded patient and does not feel that she meets code stroke criteria.  As long as patient can ambulate feel that it is appropriate that she be discharged home.  We will contact her family members.  3:46 PM Was able to ambulate here without difficulty.  Spoke with her daughter Kalman Shan and she is aware.  Feel that patient is stable for discharge home today.         Final Clinical Impression(s) / ED Diagnoses Final diagnoses:  Fall, initial encounter  Traumatic hematoma of forehead, initial encounter    Rx / DC Orders ED Discharge Orders     None         Blanchie Dessert, MD 11/23/21 1546

## 2021-11-23 NOTE — Consult Note (Signed)
NEUROLOGY CONSULTATION NOTE   Date of service: November 23, 2021 Patient Name: Tiffany [name] MRN:  [id number] DOB:  [birth year] Reason for consult: "Stroke code for concern for R sided weakness" Requesting Provider: Blanchie Dessert, MD _ _ _   _ __   _ __ _ _  __ __   _ __   __ _  History of Present [place] is a 88 y.o. female with PMH significant for DM2, afibb on warfarin, PPM, hypothyroidism, who bent down to pick something from the bottom drawer in her kitchen and slipped and hit the righ side of her head. She called EMS as her head was hurting.  EMS concerned about mild RLE drift and called her in as a trauma and a stroke code.  On arrival, patient is awake, alert and provides history herself. CTH with no ICH. No acute abnormalities.  LKW: 1230 mRS: 0 tNKASE: not offered, low suspicion for stroke Thrombectomy: not offered, low suspicion for stroke NIHSS components Score: Comment  1a Level of Conscious 0'[x]'$  1'[]'$  2'[]'$  3'[]'$      1b LOC Questions 0'[x]'$  1'[]'$  2'[]'$       1c LOC Commands 0'[x]'$  1'[]'$  2'[]'$       2 Best Gaze 0'[x]'$  1'[]'$  2'[]'$       3 Visual 0'[x]'$  1'[]'$  2'[]'$  3'[]'$      4 Facial Palsy 0'[x]'$  1'[]'$  2'[]'$  3'[]'$      5a Motor Arm - left 0'[x]'$  1'[]'$  2'[]'$  3'[]'$  4'[]'$  UN'[]'$    5b Motor Arm - Right 0'[x]'$  1'[]'$  2'[]'$  3'[]'$  4'[]'$  UN'[]'$    6a Motor Leg - Left 0'[x]'$  1'[]'$  2'[]'$  3'[]'$  4'[]'$  UN'[]'$    6b Motor Leg - Right 0'[x]'$  1'[]'$  2'[]'$  3'[]'$  4'[]'$  UN'[]'$    7 Limb Ataxia 0'[x]'$  1'[]'$  2'[]'$  3'[]'$  UN'[]'$     8 Sensory 0'[x]'$  1'[]'$  2'[]'$  UN'[]'$      9 Best Language 0'[x]'$  1'[]'$  2'[]'$  3'[]'$      10 Dysarthria 0'[x]'$  1'[]'$  2'[]'$  UN'[]'$      11 Extinct. and Inattention 0'[x]'$  1'[]'$  2'[]'$       TOTAL: 0     ROS   Constitutional Denies weight loss, fever and chills.   HEENT Denies changes in vision and hearing.   Respiratory Denies SOB and cough.   CV Denies palpitations and CP   GI Denies abdominal pain, nausea, vomiting and diarrhea.   GU Denies dysuria and urinary frequency.   MSK Denies myalgia and joint pain.   Skin Denies rash and pruritus.   Neurological +  headache but no syncope.   Psychiatric Denies recent changes in mood. Denies anxiety and depression.    Past History   Past Medical History:  Diagnosis Date   Chronic kidney disease    Diverticulosis    DM2 (diabetes mellitus, type 2) ([place])    Esophageal reflux    HTN (hypertension)    Hyperlipidemia    Hypothyroidism    Microalbuminuria    last done in 1/07    Paroxysmal atrial fibrillation (HCC)    and atrial tachycardia   Presence of permanent cardiac pacemaker    Stroke ([place]) 1999   Tachycardia-bradycardia syndrome ([place])    s/p PPM   Unspecified hypothyroidism 11/20/2013   Past Surgical History:  Procedure Laterality Date   CHOLECYSTECTOMY     COLONOSCOPY N/A 11/21/2013   Procedure: COLONOSCOPY;  Surgeon: Jeryl Columbia, MD;  Location[facility] ENDOSCOPY;  Service: Endoscopy;  Laterality: N/A;   ESOPHAGOGASTRODUODENOSCOPY (EGD) WITH PROPOFOL N/A 05/26/2020   Procedure: ESOPHAGOGASTRODUODENOSCOPY (EGD) WITH PROPOFOL;  Surgeon: Therisa Doyne,  Megan Salon, MD;  Location: [facility] ENDOSCOPY;  Service: Gastroenterology;  Laterality: N/A;   HEMORRHOID SURGERY     hysterectomy (other)     PACEMAKER GENERATOR CHANGE  10/03/12   DMT Advisa DR gen change by Dr Rayann Heman   PACEMAKER GENERATOR CHANGE N/A 10/03/2012   Procedure: PACEMAKER GENERATOR CHANGE;  Surgeon: Thompson Grayer, MD;  Location: [facility] CATH LAB;  Service: Cardiovascular;  Laterality: N/A;   permanent pacemaker  1999   updated 2006 (MDT) by Dr Verlon Setting; gen change 09-2012 by Dr Rayann Heman (MDT)   [place] N/A 12/11/2019   Procedure: PPM GENERATOR CHANGEOUT;  Surgeon: Thompson Grayer, MD;  Location: [facility] CV LAB;  Service: Cardiovascular;  Laterality: N/A;   Family History  Problem Relation Age of Onset   Tuberculosis Mother    Diabetes Father    Social History   Socioeconomic History   Marital status: Divorced    Spouse name: Not on file   Number of children: Not on file   Years of education: Not on file   Highest education level: Not on  file  Occupational History   Not on file  Tobacco Use   Smoking status: Never    Passive exposure: Never   Smokeless tobacco: Never  Vaping Use   Vaping Use: Never used  Substance and Sexual Activity   Alcohol use: No   Drug use: No   Sexual activity: Never  Other Topics Concern   Not on file  Social History Narrative   Lives in [place]. 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 Denies TED.   Social Determinants of Health   Financial Resource Strain: Not on file  Food Insecurity: Not on file  Transportation Needs: Not on file  Physical Activity: Not on file  Stress: Not on file  Social Connections: Not on file   Allergies  Allergen Reactions   Other Other (See Comments)    Nuts, Corn, popcorn - diverticulitis   Sitagliptin     Other reaction(s): Unknown    Medications  (Not in a hospital admission)    Vitals   Vitals:   11/23/21 1358 11/23/21 1400 11/23/21 1402 11/23/21 1415  BP: (!) 143/52 (!) 143/49  (!) 128/44  Pulse: 71 68  60  Resp: 17 (!) 21  20  Temp: 98 F (36.7 C)     TempSrc: Oral     SpO2: 96%   94%  Height:   5' 2.5" (1.588 m)      Body mass index is 27.29 kg/m.  Physical Exam   General: Laying comfortably in bed; in no acute distress.  HENT: Normal oropharynx and mucosa. Normal external appearance of ears and nose.  Neck: Supple, no pain or tenderness  CV: No JVD. No peripheral edema.  Pulmonary: Symmetric Chest rise. Normal respiratory effort.  Abdomen: Soft to touch, non-tender.  Ext: No cyanosis, edema, or deformity  Skin: No rash. Normal palpation of skin.   Musculoskeletal: Normal digits and nails by inspection. No clubbing.   Neurologic Examination  Mental status/Cognition: Alert, oriented to self, place, month and year, good attention.  Speech/language: Fluent, comprehension intact, object naming intact, repetition intact.  Cranial nerves:   CN II Pupils equal and reactive to light, no VF deficits     CN III,IV,VI EOM intact, no gaze preference or deviation, no nystagmus    CN V normal sensation in V1, V2, and V3 segments bilaterally    CN VII no asymmetry, no nasolabial fold flattening  CN VIII normal hearing to speech    CN IX & X normal palatal elevation, no uvular deviation   CN XI 5/5 head turn and 5/5 shoulder shrug bilaterally    CN XII midline tongue protrusion    Motor:  Muscle bulk: normal, tone normal, pronator drift none tremor none Mvmt Root Nerve  Muscle Right Left Comments  SA C5/6 Ax Deltoid 5 5   EF C5/6 Mc Biceps 5 5   EE C6/7/8 Rad Triceps 5 5   WF C6/7 Med FCR     WE C7/8 PIN ECU     F Ab C8/T1 U ADM/FDI 5 5   HF L1/2/3 Fem Illopsoas 5 5   KE L2/3/4 Fem Quad 5 5   DF L4/5 D Peron Tib Ant 5 5   PF S1/2 Tibial Grc/Sol 5 5    Sensation:  Light touch Intact throughout   Pin prick    Temperature    Vibration   Proprioception    Coordination/Complex Motor:  - Finger to Nose intact BL - Heel to shin unable to get how to do it. - Rapid alternating movement are normal - Gait: deferred.  Labs   CBC:  Recent Labs  Lab 11/23/21 1336 11/23/21 1344  WBC 5.1  --   NEUTROABS 3.1  --   HGB 10.1* 10.5*  HCT 31.6* 31.0*  MCV 86.8  --   PLT 160  --     Basic Metabolic Panel:  Lab Results  Component Value Date   NA 142 11/23/2021   K 4.0 11/23/2021   CO2 23 11/23/2021   GLUCOSE 158 (H) 11/23/2021   BUN 36 (H) 11/23/2021   CREATININE 2.10 (H) 11/23/2021   CALCIUM 9.2 11/23/2021   GFRNONAA 22 (L) 11/23/2021   GFRAA 23 (L) 12/08/2019   Lipid Panel:  Lab Results  Component Value Date   LDLCALC 32 05/04/2011   HgbA1c:  Lab Results  Component Value Date   HGBA1C 7.8 (H) 11/01/2016   Urine Drug Screen: No results found for: "LABOPIA", "COCAINSCRNUR", "LABBENZ", "AMPHETMU", "THCU", "LABBARB"  Alcohol Level     Component Value Date/Time   ETH <10 11/23/2021 1336    CT Head without contrast(Personally reviewed): CTH was negative for a  large hypodensity concerning for a large territory infarct or hyperdensity concerning for an [place] is a 88 y.o. female with PMH significant for DM2, afibb on warfarin, PPM, hypothyroidism, who bent down to pick something from the bottom drawer in her kitchen and slipped and hit the righ side of her head. She called EMS as her headt was hurting. Was brought in as a code stroke for some concern for R L weakness.  No deficit on exam, patient denies any arm or leg weakness at any point. CTH with no ICH.  Discussed with Dr. Maryan Rued and canceled the code stroke.  Recommendations  - stroke code cancelled. No further workup. We will signoff. Please feel free to contact us with any questions or concerns. ______________________________________________________________________   Thank you for the opportunity to take part in the care of this patient. If you have any further questions, please contact the neurology consultation attending.  Signed,  [place] Pager Number [phone] _ _ _   _ __   _ __ _ _  __ __   _ __   __ _

## 2021-11-23 NOTE — ED Notes (Signed)
Pt verbalizes understanding of discharge instructions. Opportunity for questions and answers were provided. Pt discharged from the ED with son.

## 2021-11-23 NOTE — ED Triage Notes (Signed)
Pt arrives via EMS from home with LSN 1230. Pt had mechanical fall and EMS reports weakness to right leg and arm. Pt has large hematoma to right side of head.

## 2021-11-24 DIAGNOSIS — Z5181 Encounter for therapeutic drug level monitoring: Secondary | ICD-10-CM | POA: Diagnosis not present

## 2021-11-24 DIAGNOSIS — I480 Paroxysmal atrial fibrillation: Secondary | ICD-10-CM | POA: Diagnosis not present

## 2021-11-24 DIAGNOSIS — Z7901 Long term (current) use of anticoagulants: Secondary | ICD-10-CM | POA: Diagnosis not present

## 2021-11-24 DIAGNOSIS — Z466 Encounter for fitting and adjustment of urinary device: Secondary | ICD-10-CM | POA: Diagnosis not present

## 2021-11-24 DIAGNOSIS — J069 Acute upper respiratory infection, unspecified: Secondary | ICD-10-CM | POA: Diagnosis not present

## 2021-11-24 DIAGNOSIS — Z792 Long term (current) use of antibiotics: Secondary | ICD-10-CM | POA: Diagnosis not present

## 2021-11-29 ENCOUNTER — Ambulatory Visit: Payer: Self-pay

## 2021-11-29 DIAGNOSIS — I480 Paroxysmal atrial fibrillation: Secondary | ICD-10-CM | POA: Diagnosis not present

## 2021-11-29 DIAGNOSIS — J069 Acute upper respiratory infection, unspecified: Secondary | ICD-10-CM | POA: Diagnosis not present

## 2021-11-29 DIAGNOSIS — Z5181 Encounter for therapeutic drug level monitoring: Secondary | ICD-10-CM | POA: Diagnosis not present

## 2021-11-29 DIAGNOSIS — Z7901 Long term (current) use of anticoagulants: Secondary | ICD-10-CM | POA: Diagnosis not present

## 2021-11-29 DIAGNOSIS — Z792 Long term (current) use of antibiotics: Secondary | ICD-10-CM | POA: Diagnosis not present

## 2021-11-29 DIAGNOSIS — Z466 Encounter for fitting and adjustment of urinary device: Secondary | ICD-10-CM | POA: Diagnosis not present

## 2021-11-29 NOTE — Patient Outreach (Signed)
  Care Coordination   Initial Visit Note   11/29/2021 Name: Tiffany [name] MRN: [id number] DOB: 1931-12-24  Tiffany [name] is a 88 y.o. year old female who sees Antony Contras, MD for primary care. I spoke with  Karlyne [place] [employer] by phone today.  What matters to the patients health and wellness today?  Patient not feeling well unable to talk today. The patient asked to rescheduled    Goals Addressed   None     SDOH assessments and interventions completed:  No     Care Coordination Interventions Activated:  No  Care Coordination Interventions:  No, not indicated   Follow up plan: Follow up call scheduled for 10/18 10 AM    Encounter Outcome:  Pt. Visit Completed   Lazaro Arms RN, BSN, [facility]   Phone: [phone]

## 2021-12-05 DIAGNOSIS — S0003XD Contusion of scalp, subsequent encounter: Secondary | ICD-10-CM | POA: Diagnosis not present

## 2021-12-05 DIAGNOSIS — Z978 Presence of other specified devices: Secondary | ICD-10-CM | POA: Diagnosis not present

## 2021-12-05 DIAGNOSIS — Z7189 Other specified counseling: Secondary | ICD-10-CM | POA: Diagnosis not present

## 2021-12-05 DIAGNOSIS — H9193 Unspecified hearing loss, bilateral: Secondary | ICD-10-CM | POA: Diagnosis not present

## 2021-12-05 DIAGNOSIS — R5381 Other malaise: Secondary | ICD-10-CM | POA: Diagnosis not present

## 2021-12-05 DIAGNOSIS — I129 Hypertensive chronic kidney disease with stage 1 through stage 4 chronic kidney disease, or unspecified chronic kidney disease: Secondary | ICD-10-CM | POA: Diagnosis not present

## 2021-12-05 DIAGNOSIS — N3946 Mixed incontinence: Secondary | ICD-10-CM | POA: Diagnosis not present

## 2021-12-05 DIAGNOSIS — Z23 Encounter for immunization: Secondary | ICD-10-CM | POA: Diagnosis not present

## 2021-12-05 DIAGNOSIS — I480 Paroxysmal atrial fibrillation: Secondary | ICD-10-CM | POA: Diagnosis not present

## 2021-12-07 DIAGNOSIS — J069 Acute upper respiratory infection, unspecified: Secondary | ICD-10-CM | POA: Diagnosis not present

## 2021-12-07 DIAGNOSIS — Z5181 Encounter for therapeutic drug level monitoring: Secondary | ICD-10-CM | POA: Diagnosis not present

## 2021-12-07 DIAGNOSIS — Z466 Encounter for fitting and adjustment of urinary device: Secondary | ICD-10-CM | POA: Diagnosis not present

## 2021-12-07 DIAGNOSIS — Z792 Long term (current) use of antibiotics: Secondary | ICD-10-CM | POA: Diagnosis not present

## 2021-12-07 DIAGNOSIS — Z7901 Long term (current) use of anticoagulants: Secondary | ICD-10-CM | POA: Diagnosis not present

## 2021-12-07 DIAGNOSIS — I480 Paroxysmal atrial fibrillation: Secondary | ICD-10-CM | POA: Diagnosis not present

## 2021-12-08 DIAGNOSIS — I480 Paroxysmal atrial fibrillation: Secondary | ICD-10-CM | POA: Diagnosis not present

## 2021-12-08 DIAGNOSIS — Z466 Encounter for fitting and adjustment of urinary device: Secondary | ICD-10-CM | POA: Diagnosis not present

## 2021-12-08 DIAGNOSIS — J069 Acute upper respiratory infection, unspecified: Secondary | ICD-10-CM | POA: Diagnosis not present

## 2021-12-08 DIAGNOSIS — Z792 Long term (current) use of antibiotics: Secondary | ICD-10-CM | POA: Diagnosis not present

## 2021-12-08 DIAGNOSIS — Z7901 Long term (current) use of anticoagulants: Secondary | ICD-10-CM | POA: Diagnosis not present

## 2021-12-08 DIAGNOSIS — Z5181 Encounter for therapeutic drug level monitoring: Secondary | ICD-10-CM | POA: Diagnosis not present

## 2021-12-09 DIAGNOSIS — Z7901 Long term (current) use of anticoagulants: Secondary | ICD-10-CM | POA: Diagnosis not present

## 2021-12-09 DIAGNOSIS — Z792 Long term (current) use of antibiotics: Secondary | ICD-10-CM | POA: Diagnosis not present

## 2021-12-09 DIAGNOSIS — I480 Paroxysmal atrial fibrillation: Secondary | ICD-10-CM | POA: Diagnosis not present

## 2021-12-09 DIAGNOSIS — Z466 Encounter for fitting and adjustment of urinary device: Secondary | ICD-10-CM | POA: Diagnosis not present

## 2021-12-09 DIAGNOSIS — J069 Acute upper respiratory infection, unspecified: Secondary | ICD-10-CM | POA: Diagnosis not present

## 2021-12-09 DIAGNOSIS — Z5181 Encounter for therapeutic drug level monitoring: Secondary | ICD-10-CM | POA: Diagnosis not present

## 2021-12-10 ENCOUNTER — Emergency Department (HOSPITAL_COMMUNITY)
Admission: EM | Admit: 2021-12-10 | Discharge: 2021-12-11 | Disposition: A | Discharge status: Home / Self Care | Payer: Medicare Other | Attending: Emergency Medicine | Admitting: Emergency Medicine

## 2021-12-10 ENCOUNTER — Emergency Department (HOSPITAL_COMMUNITY): Payer: Medicare Other

## 2021-12-10 ENCOUNTER — Encounter (HOSPITAL_COMMUNITY): Payer: Self-pay

## 2021-12-10 ENCOUNTER — Other Ambulatory Visit: Payer: Self-pay

## 2021-12-10 DIAGNOSIS — I480 Paroxysmal atrial fibrillation: Secondary | ICD-10-CM | POA: Insufficient documentation

## 2021-12-10 DIAGNOSIS — D649 Anemia, unspecified: Secondary | ICD-10-CM | POA: Diagnosis not present

## 2021-12-10 DIAGNOSIS — Z7901 Long term (current) use of anticoagulants: Secondary | ICD-10-CM | POA: Diagnosis not present

## 2021-12-10 DIAGNOSIS — Z79899 Other long term (current) drug therapy: Secondary | ICD-10-CM | POA: Diagnosis not present

## 2021-12-10 DIAGNOSIS — N3000 Acute cystitis without hematuria: Secondary | ICD-10-CM | POA: Diagnosis not present

## 2021-12-10 DIAGNOSIS — I10 Essential (primary) hypertension: Secondary | ICD-10-CM | POA: Diagnosis not present

## 2021-12-10 DIAGNOSIS — I495 Sick sinus syndrome: Secondary | ICD-10-CM | POA: Insufficient documentation

## 2021-12-10 DIAGNOSIS — R531 Weakness: Secondary | ICD-10-CM | POA: Insufficient documentation

## 2021-12-10 LAB — PROTIME-INR
INR: 3.1 — ABNORMAL HIGH (ref 0.8–1.2)
Prothrombin Time: 31.9 seconds — ABNORMAL HIGH (ref 11.4–15.2)

## 2021-12-10 LAB — URINALYSIS, ROUTINE W REFLEX MICROSCOPIC
Bilirubin Urine: NEGATIVE
Glucose, UA: NEGATIVE mg/dL
Ketones, ur: NEGATIVE mg/dL
Nitrite: POSITIVE — AB
Protein, ur: NEGATIVE mg/dL
Specific Gravity, Urine: 1.013 (ref 1.005–1.030)
WBC, UA: >50 WBC/hpf — ABNORMAL HIGH (ref 0–5)
pH: 6 (ref 5.0–8.0)

## 2021-12-10 LAB — HEPATIC FUNCTION PANEL
ALT: 25 U/L (ref 0–44)
AST: 25 U/L (ref 15–41)
Albumin: 2.9 g/dL — ABNORMAL LOW (ref 3.5–5.0)
Alkaline Phosphatase: 65 U/L (ref 38–126)
Bilirubin, Direct: 0.2 mg/dL (ref 0.0–0.2)
Indirect Bilirubin: 0.7 mg/dL (ref 0.3–0.9)
Total Bilirubin: 0.9 mg/dL (ref 0.3–1.2)
Total Protein: 6.6 g/dL (ref 6.5–8.1)

## 2021-12-10 LAB — CK: Total CK: 58 U/L (ref 38–234)

## 2021-12-10 LAB — CBC
HCT: 25.1 % — ABNORMAL LOW (ref 36.0–46.0)
Hemoglobin: 7.8 g/dL — ABNORMAL LOW (ref 12.0–15.0)
MCH: 29.4 pg (ref 26.0–34.0)
MCHC: 31.1 g/dL (ref 30.0–36.0)
MCV: 94.7 fL (ref 80.0–100.0)
Platelets: 195 10*3/uL (ref 150–400)
RBC: 2.65 MIL/uL — ABNORMAL LOW (ref 3.87–5.11)
RDW: 21.2 % — ABNORMAL HIGH (ref 11.5–15.5)
WBC: 5.5 10*3/uL (ref 4.0–10.5)
nRBC: 0 % (ref 0.0–0.2)

## 2021-12-10 LAB — BASIC METABOLIC PANEL
Anion gap: 9 (ref 5–15)
BUN: 42 mg/dL — ABNORMAL HIGH (ref 8–23)
CO2: 24 mmol/L (ref 22–32)
Calcium: 8.6 mg/dL — ABNORMAL LOW (ref 8.9–10.3)
Chloride: 108 mmol/L (ref 98–111)
Creatinine, Ser: 2.01 mg/dL — ABNORMAL HIGH (ref 0.44–1.00)
GFR, Estimated: 24 mL/min — ABNORMAL LOW (ref >60)
Glucose, Bld: 220 mg/dL — ABNORMAL HIGH (ref 70–99)
Potassium: 4.3 mmol/L (ref 3.5–5.1)
Sodium: 141 mmol/L (ref 135–145)

## 2021-12-10 LAB — TROPONIN I (HIGH SENSITIVITY): Troponin I (High Sensitivity): 17 ng/L (ref <18)

## 2021-12-10 LAB — MAGNESIUM: Magnesium: 2.3 mg/dL (ref 1.7–2.4)

## 2021-12-10 LAB — CBG MONITORING, ED: Glucose-Capillary: 205 mg/dL — ABNORMAL HIGH (ref 70–99)

## 2021-12-10 MED ORDER — CEPHALEXIN 500 MG PO CAPS: 500.0000 mg | ORAL_CAPSULE | Freq: Three times a day (TID) | ORAL | 0 refills | Status: AC

## 2021-12-10 MED ORDER — SODIUM CHLORIDE 0.9 % IV SOLN
1.0000 g | Freq: Once | INTRAVENOUS | Status: AC
  Administered 2021-12-10: 1 g via INTRAVENOUS
  Filled 2021-12-10: qty 10

## 2021-12-10 MED ORDER — SODIUM CHLORIDE 0.9 % IV SOLN: INTRAVENOUS | Status: AC

## 2021-12-10 NOTE — ED Provider Notes (Signed)
Getting her foley replaced before discharge. Nurse asking if need another UA. It appears previous Dr. Sabra Heck had added on a culture to previous UA and is treating for a UTI. I asked nurse to go ahead and send a culture on the newly obtained urine with clean catheter for antibiotic follow up. I did not participate in the care of this patient in any other manner.    Merrily Pew, MD 12/10/21 2322

## 2021-12-10 NOTE — ED Provider Notes (Signed)
[facility] EMERGENCY DEPARTMENT Provider Note   CSN: [id number] Arrival date & time: 12/10/21  1850     History  Chief Complaint  Patient presents with   Weakness    Tiffany [name] is a 88 y.o. female.   Weakness  This patient is an 88 year old female currently taking diltiazem, Lasix, Synthroid, Ozempic, rosuvastatin and Coumadin.  She presents to the hospital with a complaint of generalized weakness.  She is accompanied by her daughter who is an additional historian.  The patient was initially seen on November 23, 2021 when she had had a fall.  She has known paroxysmal atrial fibrillation, she has sick sinus syndrome, she is on Coumadin.  The initial fall occurred when she was in her kitchen, she lost her balance tried to get something out of the cabinet and fell hitting her head on the floor.  During the evaluation the patient was seen by the emergency department physician, had labs performed, imaging performed, INR was therapeutic at 3.4, CBC had a hemoglobin of 10 and creatinine was 2.1 which seems similar to baseline.  CTs were negative for intracranial hemorrhage or spinal fracture.  Neurology was consulted and downgraded the patient from a code stroke, the patient was able to ambulate and was discharged home.  Since that time the patient has been generally weak, she is not eating or drinking very much but is awake and alert, the family members have had to be going over to the house to help her and force her to eat, she continues to get some recurrent weakness.  She has had some nursing at home which is told her to come in for evaluation for a urinary tract infection as she does have a indwelling catheter and the thought was that the weakness may be coming from that.  She has not had any fevers chills nausea vomiting or diarrhea and she has no abdominal pain chest pain coughing or shortness of breath.    Home Medications Prior to Admission medications    Medication Sig Start Date End Date Taking? Authorizing Provider  cephALEXin (KEFLEX) 500 MG capsule Take 1 capsule (500 mg total) by mouth 3 (three) times daily for 7 days. 12/10/21 12/17/21 Yes Noemi Chapel, MD  diltiazem [facility]) 120 MG 24 hr capsule Take 120 mg by mouth daily.    [provider]  FERGON 240 (27 FE) MG tablet Take 240 mg by mouth in the morning and at bedtime. 10/31/14   [provider]  furosemide (LASIX) 20 MG tablet Take 20 mg by mouth daily.    [provider]  glipiZIDE (GLUCOTROL) 5 MG tablet Take 5 mg by mouth daily before breakfast.     [provider]  levothyroxine (SYNTHROID, LEVOTHROID) 50 MCG tablet Take 50 mcg by mouth daily before breakfast.     [provider]  Multiple Vitamins-Minerals (MULTIVITAMIN WITH MINERALS) tablet Take 1 tablet by mouth daily.    [provider]  OZEMPIC, 1 MG/DOSE, 4 MG/3ML SOPN Inject 1 mg into the skin once a week. 04/29/20   [provider]  potassium chloride SA (K-DUR,KLOR-CON) 20 MEQ tablet Take 20 mEq by mouth daily.    [provider]  rosuvastatin (CRESTOR) 10 MG tablet Take 10 mg by mouth daily.    [provider]  warfarin (COUMADIN) 2 MG tablet Take 2 mg by mouth daily.    [provider]      Allergies    Other and Sitagliptin  Review of Systems   Review of Systems  Neurological:  Positive for weakness.  All other systems reviewed and are negative.   Physical Exam Updated Vital Signs BP (!) 148/55 (BP Location: Right Arm)   Pulse 62   Temp 98.5 F (36.9 C) (Oral)   Resp 16   Ht 1.588 m (5' 2.5")   Wt 68 kg   SpO2 99%   BMI 27.00 kg/m  Physical Exam Vitals and nursing note reviewed.  Constitutional:      General: She is not in acute distress.    Appearance: She is well-developed.  HENT:     Head: Normocephalic.     Comments: Yellowish and brownish bruising of the right forehead with an associated hematoma, the  periorbital tissues on the right extending down the right neck and onto the chest wall.  This all appears old, there is no tenderness in this area except for the hematoma over the forehead.    Mouth/Throat:     Mouth: Mucous membranes are moist.     Pharynx: No oropharyngeal exudate.  Eyes:     General: No scleral icterus.       Right eye: No discharge.        Left eye: No discharge.     Conjunctiva/sclera: Conjunctivae normal.     Pupils: Pupils are equal, round, and reactive to light.  Neck:     Thyroid: No thyromegaly.     Vascular: No JVD.  Cardiovascular:     Rate and Rhythm: Normal rate and regular rhythm.     Heart sounds: Murmur heard.     No friction rub. No gallop.  Pulmonary:     Effort: Pulmonary effort is normal. No respiratory distress.     Breath sounds: Rales present. No wheezing.  Abdominal:     General: Bowel sounds are normal. There is no distension.     Palpations: Abdomen is soft. There is no mass.     Tenderness: There is no abdominal tenderness.  Genitourinary:    Comments: Catheter placed in the urethra, slightly cloudy urine present in the bag, free-flowing Musculoskeletal:        General: No tenderness. Normal range of motion.     Cervical back: Normal range of motion and neck supple.     Right lower leg: No edema.     Left lower leg: No edema.  Lymphadenopathy:     Cervical: No cervical adenopathy.  Skin:    General: Skin is warm and dry.     Findings: No erythema or rash.  Neurological:     General: No focal deficit present.     Mental Status: She is alert.     Coordination: Coordination normal.     Comments: This patient has normal strength in all 4 extremities, she is alert and able answer my questions appropriately, she follows commands without difficulty  Psychiatric:        Behavior: Behavior normal.     ED Results / Procedures / Treatments   Labs (all labs ordered are listed, but only abnormal results are displayed) Labs Reviewed   BASIC METABOLIC PANEL - Abnormal; Notable for the following components:      Result Value   Glucose, Bld 220 (*)    BUN 42 (*)    Creatinine, Ser 2.01 (*)    Calcium 8.6 (*)    GFR, Estimated 24 (*)    All other components within normal limits  CBC - Abnormal; Notable for the following components:  RBC 2.65 (*)    Hemoglobin 7.8 (*)    HCT 25.1 (*)    RDW 21.2 (*)    All other components within normal limits  URINALYSIS, ROUTINE W REFLEX MICROSCOPIC - Abnormal; Notable for the following components:   APPearance HAZY (*)    Hgb urine dipstick MODERATE (*)    Nitrite POSITIVE (*)    Leukocytes,Ua LARGE (*)    WBC, UA >50 (*)    Bacteria, UA FEW (*)    All other components within normal limits  PROTIME-INR - Abnormal; Notable for the following components:   Prothrombin Time 31.9 (*)    INR 3.1 (*)    All other components within normal limits  HEPATIC FUNCTION PANEL - Abnormal; Notable for the following components:   Albumin 2.9 (*)    All other components within normal limits  CBG MONITORING, ED - Abnormal; Notable for the following components:   Glucose-Capillary 205 (*)    All other components within normal limits  URINE CULTURE  MAGNESIUM  CK  TROPONIN I (HIGH SENSITIVITY)    EKG EKG Interpretation  Date/Time:  Saturday December 10 2021 19:26:19 EDT Ventricular Rate:  60 PR Interval:  198 QRS Duration: 168 QT Interval:  502 QTC Calculation: 502 R Axis:   -61 Text Interpretation: AV dual-paced rhythm Abnormal ECG When compared with ECG of 23-Nov-2021 13:58, PREVIOUS ECG IS PRESENT Confirmed by Noemi Chapel [phone]) on 12/10/2021 7:28:52 PM  Radiology CT Head Wo Contrast  Result Date: 12/10/2021 CLINICAL DATA:  Fall weakness, on anticoagulation EXAM: CT HEAD WITHOUT CONTRAST TECHNIQUE: Contiguous axial images were obtained from the base of the skull through the vertex without intravenous contrast. RADIATION DOSE REDUCTION: This exam was performed according to the  departmental dose-optimization program which includes automated exposure control, adjustment of the mA and/or kV according to patient size and/or use of iterative reconstruction technique. COMPARISON:  11/23/2021 FINDINGS: Brain: No evidence of acute infarction, hemorrhage, hydrocephalus, extra-axial collection or mass lesion/mass effect. Nonacute infarction of the right insula and operculum (series 3, image 90) Vascular: No hyperdense vessel or unexpected calcification. Skull: Normal. Negative for fracture or focal lesion. Sinuses/Orbits: No acute finding. Other: Large hematoma of the right frontal scalp measuring 3.6 x 1.4 cm (series 3, image 13). IMPRESSION: 1. No acute intracranial pathology. 2. Nonacute MCA territory infarction of the right insula and operculum. 3. Large hematoma of the right frontal scalp. Electronically Signed   By: Delanna Ahmadi M.D.   On: 12/10/2021 20:14    Procedures Procedures    Medications Ordered in ED Medications  0.9 %  sodium chloride infusion ( Intravenous New Bag/Given 12/10/21 2129)  cefTRIAXone (ROCEPHIN) 1 g in sodium chloride 0.9 % 100 mL IVPB (1 g Intravenous New Bag/Given 12/10/21 2131)    ED Course/ Medical Decision Making/ A&P                           Medical Decision Making Amount and/or Complexity of Data Reviewed Labs: ordered. Radiology: ordered.  Risk Prescription drug management.   This patient presents to the ED for concern of generalized weakness, this involves an extensive number of treatment options, and is a complaint that carries with it a high risk of complications and morbidity.  The differential diagnosis includes electrolyte abnormalities, anemia, dehydration, urinary infection   Co morbidities that complicate the patient evaluation  Anticoagulated   Additional history obtained:  Additional history obtained from family members as well as  the medical record External records from outside source obtained and reviewed  including prior imaging showing no intracranial hemorrhage   Lab Tests:  I Ordered, and personally interpreted labs.  The pertinent results include: CBC showing anemia, patient has some degree of chronic anemia, additionally she had a hematoma to her forehead and some bleeding related to the last injury so it is not surprising that it is a small amount lower today.  Her renal dysfunction is at baseline if not better today with a creatinine of 2.01 and a BUN of 42.  Her glucose was 200, troponin was 17, CK was 58 and magnesium was 2.3.  Liver function testing was unremarkable and the INR was 3.1, she is appropriately anticoagulated.   Imaging Studies ordered:  I ordered imaging studies including CT scan of the brain I independently visualized and interpreted imaging which showed prior infarct, no hemorrhage, it does show subcutaneous hematoma as expected based on clinical exam I agree with the radiologist interpretation   Cardiac Monitoring: / EKG:  The patient was maintained on a cardiac monitor.  I personally viewed and interpreted the cardiac monitored which showed an underlying rhythm of: Rate controlled, no tachycardia   Problem List / ED Course / Critical interventions / Medication management  The patient has some generalized weakness but was found to have what appears to be a significant urinary tract infection so a culture was sent and antibiotics were started with associated some IV fluids. I ordered medication including Rocephin and IV fluids for some dehydration and urinary infection Reevaluation of the patient after these medicines showed that the patient improved I have reviewed the patients home medicines and have made adjustments as needed   Social Determinants of Health:  Generally weak, indwelling Foley   Test / Admission - Considered:  Considered admission but the patient appears well, the family members are at the bedside, I have encouraged them to be close to the  mother for the next few days to make sure she is doing well.  She has a normal mental status at this time and is able to follow commands, I think she is stable for discharge on an antibiotic.  Family agrees to help her make sure she is taking it correctly.  I have discussed with the patient at the bedside the results, and the meaning of these results.  They have expressed her understanding to the need for follow-up with primary care physician        Final Clinical Impression(s) / ED Diagnoses Final diagnoses:  Acute cystitis without hematuria    Rx / DC Orders ED Discharge Orders          Ordered    cephALEXin (KEFLEX) 500 MG capsule  3 times daily        12/10/21 2231              Noemi Chapel, MD 12/10/21 2234

## 2021-12-10 NOTE — ED Triage Notes (Addendum)
Pt reports her home nurse told her to come to the ER because she might have a UTI. She denies dysuria/hematuria. She reports urinary frequency. Pt is A&OX4.  She presents with significant hematoma to forehead and bruising to her face from a previous fall 2 weeks ago.   Upon talking with the patient's daughter she reports that the patient has been very weak ever since her fall and has not been eating well. They are concerned for decline in health. Her caregiver suggested a UTI may be the cause of her progressing weakness.The patient actually has a chronic foley catheter that she has had for 3 years. Urine in leg bag appears clear yellow.

## 2021-12-10 NOTE — Discharge Instructions (Addendum)
Testing today shows that you do have a urinary infection.  We have replaced your catheter with a new catheter and started you on an antibiotic.  I would like for you to finish taking the medication called cephalexin 3 times daily for the next 7 days, if you do develop worsening pain, numbness, weakness, vomiting or fever please return to the emergency department immediately.  Otherwise only to see your doctor within 3 days for recheck

## 2021-12-11 DIAGNOSIS — Z792 Long term (current) use of antibiotics: Secondary | ICD-10-CM | POA: Diagnosis not present

## 2021-12-11 DIAGNOSIS — I480 Paroxysmal atrial fibrillation: Secondary | ICD-10-CM | POA: Diagnosis not present

## 2021-12-11 DIAGNOSIS — Z5181 Encounter for therapeutic drug level monitoring: Secondary | ICD-10-CM | POA: Diagnosis not present

## 2021-12-11 DIAGNOSIS — Z466 Encounter for fitting and adjustment of urinary device: Secondary | ICD-10-CM | POA: Diagnosis not present

## 2021-12-11 DIAGNOSIS — Z7901 Long term (current) use of anticoagulants: Secondary | ICD-10-CM | POA: Diagnosis not present

## 2021-12-11 DIAGNOSIS — J069 Acute upper respiratory infection, unspecified: Secondary | ICD-10-CM | POA: Diagnosis not present

## 2021-12-11 NOTE — ED Notes (Signed)
Number 52 old catheter removed number 16 sterile catheter replaced no trauma occassional blood clots into the nag procedure accoumpanied by ytimiyty n tech

## 2021-12-12 DIAGNOSIS — Z7901 Long term (current) use of anticoagulants: Secondary | ICD-10-CM | POA: Diagnosis not present

## 2021-12-12 DIAGNOSIS — E039 Hypothyroidism, unspecified: Secondary | ICD-10-CM | POA: Diagnosis not present

## 2021-12-12 DIAGNOSIS — N189 Chronic kidney disease, unspecified: Secondary | ICD-10-CM | POA: Diagnosis not present

## 2021-12-12 DIAGNOSIS — E1122 Type 2 diabetes mellitus with diabetic chronic kidney disease: Secondary | ICD-10-CM | POA: Diagnosis not present

## 2021-12-12 DIAGNOSIS — E785 Hyperlipidemia, unspecified: Secondary | ICD-10-CM | POA: Diagnosis not present

## 2021-12-12 DIAGNOSIS — Z5181 Encounter for therapeutic drug level monitoring: Secondary | ICD-10-CM | POA: Diagnosis not present

## 2021-12-12 DIAGNOSIS — Z7984 Long term (current) use of oral hypoglycemic drugs: Secondary | ICD-10-CM | POA: Diagnosis not present

## 2021-12-12 DIAGNOSIS — I129 Hypertensive chronic kidney disease with stage 1 through stage 4 chronic kidney disease, or unspecified chronic kidney disease: Secondary | ICD-10-CM | POA: Diagnosis not present

## 2021-12-12 DIAGNOSIS — I480 Paroxysmal atrial fibrillation: Secondary | ICD-10-CM | POA: Diagnosis not present

## 2021-12-12 DIAGNOSIS — Z7985 Long-term (current) use of injectable non-insulin antidiabetic drugs: Secondary | ICD-10-CM | POA: Diagnosis not present

## 2021-12-12 DIAGNOSIS — Z466 Encounter for fitting and adjustment of urinary device: Secondary | ICD-10-CM | POA: Diagnosis not present

## 2021-12-12 DIAGNOSIS — Z8673 Personal history of transient ischemic attack (TIA), and cerebral infarction without residual deficits: Secondary | ICD-10-CM | POA: Diagnosis not present

## 2021-12-12 DIAGNOSIS — Z85828 Personal history of other malignant neoplasm of skin: Secondary | ICD-10-CM | POA: Diagnosis not present

## 2021-12-12 DIAGNOSIS — K219 Gastro-esophageal reflux disease without esophagitis: Secondary | ICD-10-CM | POA: Diagnosis not present

## 2021-12-13 DIAGNOSIS — N390 Urinary tract infection, site not specified: Secondary | ICD-10-CM | POA: Diagnosis not present

## 2021-12-13 DIAGNOSIS — I10 Essential (primary) hypertension: Secondary | ICD-10-CM | POA: Diagnosis not present

## 2021-12-13 DIAGNOSIS — Z7985 Long-term (current) use of injectable non-insulin antidiabetic drugs: Secondary | ICD-10-CM | POA: Diagnosis not present

## 2021-12-13 DIAGNOSIS — I129 Hypertensive chronic kidney disease with stage 1 through stage 4 chronic kidney disease, or unspecified chronic kidney disease: Secondary | ICD-10-CM | POA: Diagnosis not present

## 2021-12-13 DIAGNOSIS — E1122 Type 2 diabetes mellitus with diabetic chronic kidney disease: Secondary | ICD-10-CM | POA: Diagnosis not present

## 2021-12-13 DIAGNOSIS — I480 Paroxysmal atrial fibrillation: Secondary | ICD-10-CM | POA: Diagnosis not present

## 2021-12-13 DIAGNOSIS — R609 Edema, unspecified: Secondary | ICD-10-CM | POA: Diagnosis not present

## 2021-12-13 DIAGNOSIS — N189 Chronic kidney disease, unspecified: Secondary | ICD-10-CM | POA: Diagnosis not present

## 2021-12-13 DIAGNOSIS — E7800 Pure hypercholesterolemia, unspecified: Secondary | ICD-10-CM | POA: Diagnosis not present

## 2021-12-13 DIAGNOSIS — Z7984 Long term (current) use of oral hypoglycemic drugs: Secondary | ICD-10-CM | POA: Diagnosis not present

## 2021-12-13 LAB — URINE CULTURE: Culture: >=100000 — AB

## 2021-12-14 ENCOUNTER — Ambulatory Visit: Payer: Self-pay

## 2021-12-14 ENCOUNTER — Telehealth (HOSPITAL_BASED_OUTPATIENT_CLINIC_OR_DEPARTMENT_OTHER): Payer: Self-pay | Admitting: *Deleted

## 2021-12-14 DIAGNOSIS — Z7984 Long term (current) use of oral hypoglycemic drugs: Secondary | ICD-10-CM | POA: Diagnosis not present

## 2021-12-14 DIAGNOSIS — N189 Chronic kidney disease, unspecified: Secondary | ICD-10-CM | POA: Diagnosis not present

## 2021-12-14 DIAGNOSIS — E1122 Type 2 diabetes mellitus with diabetic chronic kidney disease: Secondary | ICD-10-CM | POA: Diagnosis not present

## 2021-12-14 DIAGNOSIS — I480 Paroxysmal atrial fibrillation: Secondary | ICD-10-CM | POA: Diagnosis not present

## 2021-12-14 DIAGNOSIS — I129 Hypertensive chronic kidney disease with stage 1 through stage 4 chronic kidney disease, or unspecified chronic kidney disease: Secondary | ICD-10-CM | POA: Diagnosis not present

## 2021-12-14 DIAGNOSIS — Z7985 Long-term (current) use of injectable non-insulin antidiabetic drugs: Secondary | ICD-10-CM | POA: Diagnosis not present

## 2021-12-14 NOTE — Telephone Encounter (Signed)
Post ED Visit - Positive Culture Follow-up  Culture report reviewed by antimicrobial stewardship pharmacist: [place] Team '[]'$  Elenor Quinones, Pharm.D. '[]'$  Heide Guile, Pharm.D., BCPS AQ-ID '[]'$  Parks Neptune, Pharm.D., BCPS '[]'$  Alycia Rossetti, Pharm.D., BCPS '[]'$  Friend, Pharm.D., BCPS, AAHIVP '[]'$  Legrand Como, Pharm.D., BCPS, AAHIVP '[]'$  Salome Arnt, PharmD, BCPS '[]'$  Johnnette Gourd, PharmD, BCPS '[]'$  Hughes Better, PharmD, BCPS '[]'$  Leeroy Cha, PharmD '[]'$  Laqueta Linden, PharmD, BCPS '[]'$  Albertina Parr, PharmD  [place] Team '[]'$  Leodis Sias, PharmD '[]'$  Lindell Spar, PharmD '[]'$  Royetta Asal, PharmD '[]'$  Graylin Shiver, Rph '[]'$  Rema Fendt) Glennon Mac, PharmD '[]'$  Arlyn Dunning, PharmD '[]'$  Netta Cedars, PharmD '[]'$  Dia Sitter, PharmD '[]'$  Leone Haven, PharmD '[]'$  Gretta Arab, PharmD '[]'$  Theodis Shove, PharmD '[]'$  Peggyann Juba, PharmD '[]'$  Reuel Boom, PharmD   Positive urine culture Treated with Cephalexin, organism sensitive to the same and no further patient follow-up is required at this time. Saw PCP yesterday, feeling better. No changes made.  Harlon Flor [facility] 12/14/2021, 9:36 AM

## 2021-12-14 NOTE — Progress Notes (Signed)
ED Antimicrobial Stewardship Positive Culture Follow Up   Tiffany [name] is an 88 y.o. female who presented to [facility] on 10/14 with generalized weakness after a fall on 11/23/21. Her home health nurse advised her to get worked up for a UTI. PMH significant for CKD, DM, stroke and patient has a chronic indwelling catheter. Pt does not note any urinary symptoms, flank pain or fever. She was prescribed a 10 day course of cephalexin for UTI treatment.    Recent Results (from the past 720 hour(s))  Urine Culture     Status: Abnormal   Collection Time: 12/10/21  7:14 PM   Specimen: Urine, Catheterized  Result Value Ref Range Status   Specimen Description URINE, CATHETERIZED  Final   Special Requests   Final    NONE Performed at [facility], [address] [place], [place] [address]    Culture (A)  Final    >=100,000 COLONIES/mL ESCHERICHIA COLI >=100,000 COLONIES/mL KLEBSIELLA PNEUMONIAE    Report Status 12/13/2021 FINAL  Final   Organism ID, Bacteria ESCHERICHIA COLI (A)  Final   Organism ID, Bacteria KLEBSIELLA PNEUMONIAE (A)  Final      Susceptibility   Escherichia coli - MIC*    AMPICILLIN >=32 RESISTANT Resistant     CEFAZOLIN >=64 RESISTANT Resistant     CEFEPIME <=0.12 SENSITIVE Sensitive     CEFTRIAXONE 1 SENSITIVE Sensitive     CIPROFLOXACIN <=0.25 SENSITIVE Sensitive     GENTAMICIN <=1 SENSITIVE Sensitive     IMIPENEM <=0.25 SENSITIVE Sensitive     NITROFURANTOIN <=16 SENSITIVE Sensitive     TRIMETH/SULFA <=20 SENSITIVE Sensitive     AMPICILLIN/SULBACTAM >=32 RESISTANT Resistant     PIP/TAZO 64 INTERMEDIATE Intermediate     * >=100,000 COLONIES/mL ESCHERICHIA COLI   Klebsiella pneumoniae - MIC*    AMPICILLIN >=32 RESISTANT Resistant     CEFAZOLIN <=4 SENSITIVE Sensitive     CEFEPIME <=0.12 SENSITIVE Sensitive     CEFTRIAXONE <=0.25 SENSITIVE Sensitive     CIPROFLOXACIN <=0.25 SENSITIVE Sensitive     GENTAMICIN <=1 SENSITIVE Sensitive     IMIPENEM  <=0.25 SENSITIVE Sensitive     NITROFURANTOIN 32 SENSITIVE Sensitive     TRIMETH/SULFA <=20 SENSITIVE Sensitive     AMPICILLIN/SULBACTAM 4 SENSITIVE Sensitive     PIP/TAZO <=4 SENSITIVE Sensitive     * >=100,000 COLONIES/mL KLEBSIELLA PNEUMONIAE    Treated with Cephalexin, Klebsiella isolate is susceptible but E. Coli is resistant to prescribed antimicrobial.   Plan: Call patient for a symptom check.  If symptoms have resolved, advise patient to complete current course of antibiotics for klebsiella treatment.  If symptoms have not resolved recommend discontinuation of current antibiotics and initiation of cefdinir '300mg'$  daily for 5 days.   ED Provider: Pollie Friar, PharmD PGY1 Pharmacy Resident 12/14/2021 9:39 AM

## 2021-12-14 NOTE — Patient Outreach (Signed)
  Care Coordination   12/14/2021 Name: Tiffany [name] MRN: [id number] DOB: October 02, 1931   Care Coordination Outreach Attempts:  A second unsuccessful outreach was attempted today to offer the patient with information about available care coordination services as a benefit of their health plan.     Follow Up Plan:  Additional outreach attempts will be made to offer the patient care coordination information and services.   Encounter Outcome:  No Answer  Care Coordination Interventions Activated:  No   Care Coordination Interventions:  No, not indicated    Lazaro Arms RN, BSN, [facility]   Phone: [phone]

## 2021-12-15 DIAGNOSIS — I480 Paroxysmal atrial fibrillation: Secondary | ICD-10-CM | POA: Diagnosis not present

## 2021-12-15 DIAGNOSIS — Z7985 Long-term (current) use of injectable non-insulin antidiabetic drugs: Secondary | ICD-10-CM | POA: Diagnosis not present

## 2021-12-15 DIAGNOSIS — I129 Hypertensive chronic kidney disease with stage 1 through stage 4 chronic kidney disease, or unspecified chronic kidney disease: Secondary | ICD-10-CM | POA: Diagnosis not present

## 2021-12-15 DIAGNOSIS — N189 Chronic kidney disease, unspecified: Secondary | ICD-10-CM | POA: Diagnosis not present

## 2021-12-15 DIAGNOSIS — E1122 Type 2 diabetes mellitus with diabetic chronic kidney disease: Secondary | ICD-10-CM | POA: Diagnosis not present

## 2021-12-15 DIAGNOSIS — Z7984 Long term (current) use of oral hypoglycemic drugs: Secondary | ICD-10-CM | POA: Diagnosis not present

## 2021-12-16 DIAGNOSIS — I129 Hypertensive chronic kidney disease with stage 1 through stage 4 chronic kidney disease, or unspecified chronic kidney disease: Secondary | ICD-10-CM | POA: Diagnosis not present

## 2021-12-16 DIAGNOSIS — Z7984 Long term (current) use of oral hypoglycemic drugs: Secondary | ICD-10-CM | POA: Diagnosis not present

## 2021-12-16 DIAGNOSIS — N189 Chronic kidney disease, unspecified: Secondary | ICD-10-CM | POA: Diagnosis not present

## 2021-12-16 DIAGNOSIS — Z7985 Long-term (current) use of injectable non-insulin antidiabetic drugs: Secondary | ICD-10-CM | POA: Diagnosis not present

## 2021-12-16 DIAGNOSIS — E1122 Type 2 diabetes mellitus with diabetic chronic kidney disease: Secondary | ICD-10-CM | POA: Diagnosis not present

## 2021-12-16 DIAGNOSIS — I480 Paroxysmal atrial fibrillation: Secondary | ICD-10-CM | POA: Diagnosis not present

## 2021-12-16 NOTE — Progress Notes (Signed)
RS 12/29/21  [place]  Direct Dial: [phone]

## 2021-12-19 DIAGNOSIS — N189 Chronic kidney disease, unspecified: Secondary | ICD-10-CM | POA: Diagnosis not present

## 2021-12-19 DIAGNOSIS — I480 Paroxysmal atrial fibrillation: Secondary | ICD-10-CM | POA: Diagnosis not present

## 2021-12-19 DIAGNOSIS — I129 Hypertensive chronic kidney disease with stage 1 through stage 4 chronic kidney disease, or unspecified chronic kidney disease: Secondary | ICD-10-CM | POA: Diagnosis not present

## 2021-12-19 DIAGNOSIS — Z7985 Long-term (current) use of injectable non-insulin antidiabetic drugs: Secondary | ICD-10-CM | POA: Diagnosis not present

## 2021-12-19 DIAGNOSIS — Z7984 Long term (current) use of oral hypoglycemic drugs: Secondary | ICD-10-CM | POA: Diagnosis not present

## 2021-12-19 DIAGNOSIS — E1122 Type 2 diabetes mellitus with diabetic chronic kidney disease: Secondary | ICD-10-CM | POA: Diagnosis not present

## 2021-12-20 ENCOUNTER — Ambulatory Visit (INDEPENDENT_AMBULATORY_CARE_PROVIDER_SITE_OTHER): Payer: Medicare Other

## 2021-12-20 DIAGNOSIS — N189 Chronic kidney disease, unspecified: Secondary | ICD-10-CM | POA: Diagnosis not present

## 2021-12-20 DIAGNOSIS — I480 Paroxysmal atrial fibrillation: Secondary | ICD-10-CM | POA: Diagnosis not present

## 2021-12-20 DIAGNOSIS — Z7985 Long-term (current) use of injectable non-insulin antidiabetic drugs: Secondary | ICD-10-CM | POA: Diagnosis not present

## 2021-12-20 DIAGNOSIS — E1122 Type 2 diabetes mellitus with diabetic chronic kidney disease: Secondary | ICD-10-CM | POA: Diagnosis not present

## 2021-12-20 DIAGNOSIS — I495 Sick sinus syndrome: Secondary | ICD-10-CM

## 2021-12-20 DIAGNOSIS — N390 Urinary tract infection, site not specified: Secondary | ICD-10-CM | POA: Diagnosis not present

## 2021-12-20 DIAGNOSIS — I129 Hypertensive chronic kidney disease with stage 1 through stage 4 chronic kidney disease, or unspecified chronic kidney disease: Secondary | ICD-10-CM | POA: Diagnosis not present

## 2021-12-20 DIAGNOSIS — Z7984 Long term (current) use of oral hypoglycemic drugs: Secondary | ICD-10-CM | POA: Diagnosis not present

## 2021-12-20 LAB — CUP PACEART REMOTE DEVICE CHECK
Battery Remaining Longevity: 63 mo
Battery Voltage: 2.97 V
Brady Statistic AP VP Percent: 99.98 %
Brady Statistic AP VS Percent: 0 %
Brady Statistic AS VP Percent: 0 %
Brady Statistic AS VS Percent: 0.01 %
Brady Statistic RA Percent Paced: 100 %
Brady Statistic RV Percent Paced: 99.98 %
Date Time Interrogation Session: 20231023203612
Implantable Lead Connection Status: 753985
Implantable Lead Connection Status: 753985
Implantable Lead Implant Date: 19990907
Implantable Lead Implant Date: 19990907
Implantable Lead Location: 753859
Implantable Lead Location: 753860
Implantable Lead Serial Number: 32904
Implantable Lead Serial Number: 33421
Implantable Pulse Generator Implant Date: 20211014
Lead Channel Impedance Value: 228 Ohm
Lead Channel Impedance Value: 228 Ohm
Lead Channel Impedance Value: 228 Ohm
Lead Channel Impedance Value: 228 Ohm
Lead Channel Pacing Threshold Amplitude: 1 V
Lead Channel Pacing Threshold Pulse Width: 0.4 ms
Lead Channel Sensing Intrinsic Amplitude: 12 mV
Lead Channel Sensing Intrinsic Amplitude: 12 mV
Lead Channel Setting Pacing Amplitude: 2 V
Lead Channel Setting Pacing Amplitude: 2.5 V
Lead Channel Setting Pacing Pulse Width: 0.4 ms
Lead Channel Setting Sensing Sensitivity: 8 mV
Zone Setting Status: 755011
Zone Setting Status: 755011

## 2021-12-22 DIAGNOSIS — Z7985 Long-term (current) use of injectable non-insulin antidiabetic drugs: Secondary | ICD-10-CM | POA: Diagnosis not present

## 2021-12-22 DIAGNOSIS — I129 Hypertensive chronic kidney disease with stage 1 through stage 4 chronic kidney disease, or unspecified chronic kidney disease: Secondary | ICD-10-CM | POA: Diagnosis not present

## 2021-12-22 DIAGNOSIS — N189 Chronic kidney disease, unspecified: Secondary | ICD-10-CM | POA: Diagnosis not present

## 2021-12-22 DIAGNOSIS — I480 Paroxysmal atrial fibrillation: Secondary | ICD-10-CM | POA: Diagnosis not present

## 2021-12-22 DIAGNOSIS — E1122 Type 2 diabetes mellitus with diabetic chronic kidney disease: Secondary | ICD-10-CM | POA: Diagnosis not present

## 2021-12-22 DIAGNOSIS — Z7984 Long term (current) use of oral hypoglycemic drugs: Secondary | ICD-10-CM | POA: Diagnosis not present

## 2021-12-23 DIAGNOSIS — Z7984 Long term (current) use of oral hypoglycemic drugs: Secondary | ICD-10-CM | POA: Diagnosis not present

## 2021-12-23 DIAGNOSIS — Z7985 Long-term (current) use of injectable non-insulin antidiabetic drugs: Secondary | ICD-10-CM | POA: Diagnosis not present

## 2021-12-23 DIAGNOSIS — I129 Hypertensive chronic kidney disease with stage 1 through stage 4 chronic kidney disease, or unspecified chronic kidney disease: Secondary | ICD-10-CM | POA: Diagnosis not present

## 2021-12-23 DIAGNOSIS — E1122 Type 2 diabetes mellitus with diabetic chronic kidney disease: Secondary | ICD-10-CM | POA: Diagnosis not present

## 2021-12-23 DIAGNOSIS — N189 Chronic kidney disease, unspecified: Secondary | ICD-10-CM | POA: Diagnosis not present

## 2021-12-23 DIAGNOSIS — I480 Paroxysmal atrial fibrillation: Secondary | ICD-10-CM | POA: Diagnosis not present

## 2021-12-24 DIAGNOSIS — I129 Hypertensive chronic kidney disease with stage 1 through stage 4 chronic kidney disease, or unspecified chronic kidney disease: Secondary | ICD-10-CM | POA: Diagnosis not present

## 2021-12-24 DIAGNOSIS — Z7984 Long term (current) use of oral hypoglycemic drugs: Secondary | ICD-10-CM | POA: Diagnosis not present

## 2021-12-24 DIAGNOSIS — N189 Chronic kidney disease, unspecified: Secondary | ICD-10-CM | POA: Diagnosis not present

## 2021-12-24 DIAGNOSIS — I480 Paroxysmal atrial fibrillation: Secondary | ICD-10-CM | POA: Diagnosis not present

## 2021-12-24 DIAGNOSIS — E1122 Type 2 diabetes mellitus with diabetic chronic kidney disease: Secondary | ICD-10-CM | POA: Diagnosis not present

## 2021-12-24 DIAGNOSIS — Z7985 Long-term (current) use of injectable non-insulin antidiabetic drugs: Secondary | ICD-10-CM | POA: Diagnosis not present

## 2021-12-26 DIAGNOSIS — I129 Hypertensive chronic kidney disease with stage 1 through stage 4 chronic kidney disease, or unspecified chronic kidney disease: Secondary | ICD-10-CM | POA: Diagnosis not present

## 2021-12-26 DIAGNOSIS — E1122 Type 2 diabetes mellitus with diabetic chronic kidney disease: Secondary | ICD-10-CM | POA: Diagnosis not present

## 2021-12-26 DIAGNOSIS — Z7985 Long-term (current) use of injectable non-insulin antidiabetic drugs: Secondary | ICD-10-CM | POA: Diagnosis not present

## 2021-12-26 DIAGNOSIS — N189 Chronic kidney disease, unspecified: Secondary | ICD-10-CM | POA: Diagnosis not present

## 2021-12-26 DIAGNOSIS — I480 Paroxysmal atrial fibrillation: Secondary | ICD-10-CM | POA: Diagnosis not present

## 2021-12-26 DIAGNOSIS — Z7984 Long term (current) use of oral hypoglycemic drugs: Secondary | ICD-10-CM | POA: Diagnosis not present

## 2021-12-27 DIAGNOSIS — Z7985 Long-term (current) use of injectable non-insulin antidiabetic drugs: Secondary | ICD-10-CM | POA: Diagnosis not present

## 2021-12-27 DIAGNOSIS — I480 Paroxysmal atrial fibrillation: Secondary | ICD-10-CM | POA: Diagnosis not present

## 2021-12-27 DIAGNOSIS — Z7984 Long term (current) use of oral hypoglycemic drugs: Secondary | ICD-10-CM | POA: Diagnosis not present

## 2021-12-27 DIAGNOSIS — I129 Hypertensive chronic kidney disease with stage 1 through stage 4 chronic kidney disease, or unspecified chronic kidney disease: Secondary | ICD-10-CM | POA: Diagnosis not present

## 2021-12-27 DIAGNOSIS — N189 Chronic kidney disease, unspecified: Secondary | ICD-10-CM | POA: Diagnosis not present

## 2021-12-27 DIAGNOSIS — E1122 Type 2 diabetes mellitus with diabetic chronic kidney disease: Secondary | ICD-10-CM | POA: Diagnosis not present

## 2021-12-28 DIAGNOSIS — I129 Hypertensive chronic kidney disease with stage 1 through stage 4 chronic kidney disease, or unspecified chronic kidney disease: Secondary | ICD-10-CM | POA: Diagnosis not present

## 2021-12-28 DIAGNOSIS — Z7985 Long-term (current) use of injectable non-insulin antidiabetic drugs: Secondary | ICD-10-CM | POA: Diagnosis not present

## 2021-12-28 DIAGNOSIS — I480 Paroxysmal atrial fibrillation: Secondary | ICD-10-CM | POA: Diagnosis not present

## 2021-12-28 DIAGNOSIS — N189 Chronic kidney disease, unspecified: Secondary | ICD-10-CM | POA: Diagnosis not present

## 2021-12-28 DIAGNOSIS — Z7984 Long term (current) use of oral hypoglycemic drugs: Secondary | ICD-10-CM | POA: Diagnosis not present

## 2021-12-28 DIAGNOSIS — E1122 Type 2 diabetes mellitus with diabetic chronic kidney disease: Secondary | ICD-10-CM | POA: Diagnosis not present

## 2021-12-29 ENCOUNTER — Ambulatory Visit: Payer: Self-pay

## 2021-12-29 NOTE — Patient Outreach (Signed)
  Care Coordination   Initial Visit Note   12/29/2021 Name: Leighla Chestnutt MRN: [id number] DOB: [birth year]  Calley Drenning is a 88 y.o. year old female who sees Antony Contras, MD for primary care. I spoke with  Karlyne Greenspan Stolarz by phone today.  What matters to the patients health and wellness today?  I feel good and do not need assistance at this time.    Goals Addressed             This Visit's Progress    COMPLETED: Care Coordination Activites - nofollow up required        Care Coordination Interventions: Active listening / Reflection utilized  Discussed/.Educated Care Coordination Program 2.   Please inform PCP if services needed in the future  I spoke with Mrs. Dempario and explained the concept of care coordination to her. However, she informed me that she already has a nurse and receives assistance from people who visit her 2-3 times a week, along with physical therapy. She also mentioned that she feels good and doesn't require any further assistance at the moment. I advised her to inform her primary care physician (PCP) if she needs any help in the future, and we will be happy to provide assistance.         SDOH assessments and interventions completed:  No     Care Coordination Interventions Activated:  Yes  Care Coordination Interventions:  Yes, provided   Follow up plan: No further intervention required.   Encounter Outcome:  Pt. Visit Completed   Lazaro Arms RN, BSN, [facility]   Phone: [phone]

## 2021-12-29 NOTE — Patient Instructions (Signed)
Visit Information  Thank you for taking time to visit with me today. Please don't hesitate to contact me if I can be of assistance to you.   Following are the goals we discussed today:   Goals Addressed             This Visit's Progress    COMPLETED: Care Coordination Activites - nofollow up required        Care Coordination Interventions: Active listening / Reflection utilized  Discussed/.Educated Care Coordination Program 2.   Please inform PCP if services needed in the future  I spoke with Mrs. Dempario and explained the concept of care coordination to her. However, she informed me that she already has a nurse and receives assistance from people who visit her 2-3 times a week, along with physical therapy. She also mentioned that she feels good and doesn't require any further assistance at the moment. I advised her to inform her primary care physician (PCP) if she needs any help in the future, and we will be happy to provide assistance.         .   If you are experiencing a Mental Health or [place] or need someone to talk to, please call 1-800-273-TALK (toll free, 24 hour hotline)  Patient verbalizes understanding of instructions and care plan provided today and agrees to view in [place]. Active MyChart status and patient understanding of how to access instructions and care plan via MyChart confirmed with patient.     Lazaro Arms RN, BSN, [facility]   Phone: [phone]

## 2021-12-30 DIAGNOSIS — I480 Paroxysmal atrial fibrillation: Secondary | ICD-10-CM | POA: Diagnosis not present

## 2021-12-30 DIAGNOSIS — Z7984 Long term (current) use of oral hypoglycemic drugs: Secondary | ICD-10-CM | POA: Diagnosis not present

## 2021-12-30 DIAGNOSIS — E1122 Type 2 diabetes mellitus with diabetic chronic kidney disease: Secondary | ICD-10-CM | POA: Diagnosis not present

## 2021-12-30 DIAGNOSIS — N189 Chronic kidney disease, unspecified: Secondary | ICD-10-CM | POA: Diagnosis not present

## 2021-12-30 DIAGNOSIS — Z7985 Long-term (current) use of injectable non-insulin antidiabetic drugs: Secondary | ICD-10-CM | POA: Diagnosis not present

## 2021-12-30 DIAGNOSIS — I129 Hypertensive chronic kidney disease with stage 1 through stage 4 chronic kidney disease, or unspecified chronic kidney disease: Secondary | ICD-10-CM | POA: Diagnosis not present

## 2022-01-01 DIAGNOSIS — R310 Gross hematuria: Secondary | ICD-10-CM | POA: Diagnosis not present

## 2022-01-01 DIAGNOSIS — N3091 Cystitis, unspecified with hematuria: Secondary | ICD-10-CM | POA: Diagnosis not present

## 2022-01-03 DIAGNOSIS — I480 Paroxysmal atrial fibrillation: Secondary | ICD-10-CM | POA: Diagnosis not present

## 2022-01-03 DIAGNOSIS — Z7985 Long-term (current) use of injectable non-insulin antidiabetic drugs: Secondary | ICD-10-CM | POA: Diagnosis not present

## 2022-01-03 DIAGNOSIS — Z7984 Long term (current) use of oral hypoglycemic drugs: Secondary | ICD-10-CM | POA: Diagnosis not present

## 2022-01-03 DIAGNOSIS — N189 Chronic kidney disease, unspecified: Secondary | ICD-10-CM | POA: Diagnosis not present

## 2022-01-03 DIAGNOSIS — I129 Hypertensive chronic kidney disease with stage 1 through stage 4 chronic kidney disease, or unspecified chronic kidney disease: Secondary | ICD-10-CM | POA: Diagnosis not present

## 2022-01-03 DIAGNOSIS — E1122 Type 2 diabetes mellitus with diabetic chronic kidney disease: Secondary | ICD-10-CM | POA: Diagnosis not present

## 2022-01-04 DIAGNOSIS — Z7985 Long-term (current) use of injectable non-insulin antidiabetic drugs: Secondary | ICD-10-CM | POA: Diagnosis not present

## 2022-01-04 DIAGNOSIS — N189 Chronic kidney disease, unspecified: Secondary | ICD-10-CM | POA: Diagnosis not present

## 2022-01-04 DIAGNOSIS — I480 Paroxysmal atrial fibrillation: Secondary | ICD-10-CM | POA: Diagnosis not present

## 2022-01-04 DIAGNOSIS — Z7984 Long term (current) use of oral hypoglycemic drugs: Secondary | ICD-10-CM | POA: Diagnosis not present

## 2022-01-04 DIAGNOSIS — I129 Hypertensive chronic kidney disease with stage 1 through stage 4 chronic kidney disease, or unspecified chronic kidney disease: Secondary | ICD-10-CM | POA: Diagnosis not present

## 2022-01-04 DIAGNOSIS — E1122 Type 2 diabetes mellitus with diabetic chronic kidney disease: Secondary | ICD-10-CM | POA: Diagnosis not present

## 2022-01-05 DIAGNOSIS — Z7985 Long-term (current) use of injectable non-insulin antidiabetic drugs: Secondary | ICD-10-CM | POA: Diagnosis not present

## 2022-01-05 DIAGNOSIS — N189 Chronic kidney disease, unspecified: Secondary | ICD-10-CM | POA: Diagnosis not present

## 2022-01-05 DIAGNOSIS — E1122 Type 2 diabetes mellitus with diabetic chronic kidney disease: Secondary | ICD-10-CM | POA: Diagnosis not present

## 2022-01-05 DIAGNOSIS — I480 Paroxysmal atrial fibrillation: Secondary | ICD-10-CM | POA: Diagnosis not present

## 2022-01-05 DIAGNOSIS — Z7984 Long term (current) use of oral hypoglycemic drugs: Secondary | ICD-10-CM | POA: Diagnosis not present

## 2022-01-05 DIAGNOSIS — I129 Hypertensive chronic kidney disease with stage 1 through stage 4 chronic kidney disease, or unspecified chronic kidney disease: Secondary | ICD-10-CM | POA: Diagnosis not present

## 2022-01-06 DIAGNOSIS — Z7985 Long-term (current) use of injectable non-insulin antidiabetic drugs: Secondary | ICD-10-CM | POA: Diagnosis not present

## 2022-01-06 DIAGNOSIS — I129 Hypertensive chronic kidney disease with stage 1 through stage 4 chronic kidney disease, or unspecified chronic kidney disease: Secondary | ICD-10-CM | POA: Diagnosis not present

## 2022-01-06 DIAGNOSIS — I480 Paroxysmal atrial fibrillation: Secondary | ICD-10-CM | POA: Diagnosis not present

## 2022-01-06 DIAGNOSIS — E1122 Type 2 diabetes mellitus with diabetic chronic kidney disease: Secondary | ICD-10-CM | POA: Diagnosis not present

## 2022-01-06 DIAGNOSIS — N189 Chronic kidney disease, unspecified: Secondary | ICD-10-CM | POA: Diagnosis not present

## 2022-01-06 DIAGNOSIS — N390 Urinary tract infection, site not specified: Secondary | ICD-10-CM | POA: Diagnosis not present

## 2022-01-06 DIAGNOSIS — Z7984 Long term (current) use of oral hypoglycemic drugs: Secondary | ICD-10-CM | POA: Diagnosis not present

## 2022-01-06 NOTE — Progress Notes (Signed)
Remote pacemaker transmission.   

## 2022-01-09 DIAGNOSIS — Z7985 Long-term (current) use of injectable non-insulin antidiabetic drugs: Secondary | ICD-10-CM | POA: Diagnosis not present

## 2022-01-09 DIAGNOSIS — N189 Chronic kidney disease, unspecified: Secondary | ICD-10-CM | POA: Diagnosis not present

## 2022-01-09 DIAGNOSIS — I480 Paroxysmal atrial fibrillation: Secondary | ICD-10-CM | POA: Diagnosis not present

## 2022-01-09 DIAGNOSIS — Z7984 Long term (current) use of oral hypoglycemic drugs: Secondary | ICD-10-CM | POA: Diagnosis not present

## 2022-01-09 DIAGNOSIS — Z9181 History of falling: Secondary | ICD-10-CM | POA: Diagnosis not present

## 2022-01-09 DIAGNOSIS — E1122 Type 2 diabetes mellitus with diabetic chronic kidney disease: Secondary | ICD-10-CM | POA: Diagnosis not present

## 2022-01-09 DIAGNOSIS — Z978 Presence of other specified devices: Secondary | ICD-10-CM | POA: Diagnosis not present

## 2022-01-09 DIAGNOSIS — Z9989 Dependence on other enabling machines and devices: Secondary | ICD-10-CM | POA: Diagnosis not present

## 2022-01-09 DIAGNOSIS — G629 Polyneuropathy, unspecified: Secondary | ICD-10-CM | POA: Diagnosis not present

## 2022-01-09 DIAGNOSIS — D509 Iron deficiency anemia, unspecified: Secondary | ICD-10-CM | POA: Diagnosis not present

## 2022-01-09 DIAGNOSIS — I129 Hypertensive chronic kidney disease with stage 1 through stage 4 chronic kidney disease, or unspecified chronic kidney disease: Secondary | ICD-10-CM | POA: Diagnosis not present

## 2022-01-10 DIAGNOSIS — Z7984 Long term (current) use of oral hypoglycemic drugs: Secondary | ICD-10-CM | POA: Diagnosis not present

## 2022-01-10 DIAGNOSIS — E1122 Type 2 diabetes mellitus with diabetic chronic kidney disease: Secondary | ICD-10-CM | POA: Diagnosis not present

## 2022-01-10 DIAGNOSIS — I10 Essential (primary) hypertension: Secondary | ICD-10-CM | POA: Diagnosis not present

## 2022-01-10 DIAGNOSIS — I480 Paroxysmal atrial fibrillation: Secondary | ICD-10-CM | POA: Diagnosis not present

## 2022-01-10 DIAGNOSIS — I129 Hypertensive chronic kidney disease with stage 1 through stage 4 chronic kidney disease, or unspecified chronic kidney disease: Secondary | ICD-10-CM | POA: Diagnosis not present

## 2022-01-10 DIAGNOSIS — Z7985 Long-term (current) use of injectable non-insulin antidiabetic drugs: Secondary | ICD-10-CM | POA: Diagnosis not present

## 2022-01-10 DIAGNOSIS — E7800 Pure hypercholesterolemia, unspecified: Secondary | ICD-10-CM | POA: Diagnosis not present

## 2022-01-10 DIAGNOSIS — N189 Chronic kidney disease, unspecified: Secondary | ICD-10-CM | POA: Diagnosis not present

## 2022-01-11 DIAGNOSIS — Z8673 Personal history of transient ischemic attack (TIA), and cerebral infarction without residual deficits: Secondary | ICD-10-CM | POA: Diagnosis not present

## 2022-01-11 DIAGNOSIS — N189 Chronic kidney disease, unspecified: Secondary | ICD-10-CM | POA: Diagnosis not present

## 2022-01-11 DIAGNOSIS — Z466 Encounter for fitting and adjustment of urinary device: Secondary | ICD-10-CM | POA: Diagnosis not present

## 2022-01-11 DIAGNOSIS — Z5181 Encounter for therapeutic drug level monitoring: Secondary | ICD-10-CM | POA: Diagnosis not present

## 2022-01-11 DIAGNOSIS — I480 Paroxysmal atrial fibrillation: Secondary | ICD-10-CM | POA: Diagnosis not present

## 2022-01-11 DIAGNOSIS — I129 Hypertensive chronic kidney disease with stage 1 through stage 4 chronic kidney disease, or unspecified chronic kidney disease: Secondary | ICD-10-CM | POA: Diagnosis not present

## 2022-01-11 DIAGNOSIS — Z7985 Long-term (current) use of injectable non-insulin antidiabetic drugs: Secondary | ICD-10-CM | POA: Diagnosis not present

## 2022-01-11 DIAGNOSIS — E1122 Type 2 diabetes mellitus with diabetic chronic kidney disease: Secondary | ICD-10-CM | POA: Diagnosis not present

## 2022-01-11 DIAGNOSIS — K219 Gastro-esophageal reflux disease without esophagitis: Secondary | ICD-10-CM | POA: Diagnosis not present

## 2022-01-11 DIAGNOSIS — Z85828 Personal history of other malignant neoplasm of skin: Secondary | ICD-10-CM | POA: Diagnosis not present

## 2022-01-11 DIAGNOSIS — E785 Hyperlipidemia, unspecified: Secondary | ICD-10-CM | POA: Diagnosis not present

## 2022-01-11 DIAGNOSIS — Z7901 Long term (current) use of anticoagulants: Secondary | ICD-10-CM | POA: Diagnosis not present

## 2022-01-11 DIAGNOSIS — E039 Hypothyroidism, unspecified: Secondary | ICD-10-CM | POA: Diagnosis not present

## 2022-01-11 DIAGNOSIS — Z7984 Long term (current) use of oral hypoglycemic drugs: Secondary | ICD-10-CM | POA: Diagnosis not present

## 2022-01-16 DIAGNOSIS — Z7985 Long-term (current) use of injectable non-insulin antidiabetic drugs: Secondary | ICD-10-CM | POA: Diagnosis not present

## 2022-01-16 DIAGNOSIS — E1122 Type 2 diabetes mellitus with diabetic chronic kidney disease: Secondary | ICD-10-CM | POA: Diagnosis not present

## 2022-01-16 DIAGNOSIS — Z7984 Long term (current) use of oral hypoglycemic drugs: Secondary | ICD-10-CM | POA: Diagnosis not present

## 2022-01-16 DIAGNOSIS — I480 Paroxysmal atrial fibrillation: Secondary | ICD-10-CM | POA: Diagnosis not present

## 2022-01-16 DIAGNOSIS — N189 Chronic kidney disease, unspecified: Secondary | ICD-10-CM | POA: Diagnosis not present

## 2022-01-16 DIAGNOSIS — I129 Hypertensive chronic kidney disease with stage 1 through stage 4 chronic kidney disease, or unspecified chronic kidney disease: Secondary | ICD-10-CM | POA: Diagnosis not present

## 2022-01-17 DIAGNOSIS — Z7985 Long-term (current) use of injectable non-insulin antidiabetic drugs: Secondary | ICD-10-CM | POA: Diagnosis not present

## 2022-01-17 DIAGNOSIS — I480 Paroxysmal atrial fibrillation: Secondary | ICD-10-CM | POA: Diagnosis not present

## 2022-01-17 DIAGNOSIS — E1122 Type 2 diabetes mellitus with diabetic chronic kidney disease: Secondary | ICD-10-CM | POA: Diagnosis not present

## 2022-01-17 DIAGNOSIS — Z7984 Long term (current) use of oral hypoglycemic drugs: Secondary | ICD-10-CM | POA: Diagnosis not present

## 2022-01-17 DIAGNOSIS — N189 Chronic kidney disease, unspecified: Secondary | ICD-10-CM | POA: Diagnosis not present

## 2022-01-17 DIAGNOSIS — I129 Hypertensive chronic kidney disease with stage 1 through stage 4 chronic kidney disease, or unspecified chronic kidney disease: Secondary | ICD-10-CM | POA: Diagnosis not present

## 2022-01-23 DIAGNOSIS — N189 Chronic kidney disease, unspecified: Secondary | ICD-10-CM | POA: Diagnosis not present

## 2022-01-23 DIAGNOSIS — E1122 Type 2 diabetes mellitus with diabetic chronic kidney disease: Secondary | ICD-10-CM | POA: Diagnosis not present

## 2022-01-23 DIAGNOSIS — I129 Hypertensive chronic kidney disease with stage 1 through stage 4 chronic kidney disease, or unspecified chronic kidney disease: Secondary | ICD-10-CM | POA: Diagnosis not present

## 2022-01-23 DIAGNOSIS — I480 Paroxysmal atrial fibrillation: Secondary | ICD-10-CM | POA: Diagnosis not present

## 2022-01-23 DIAGNOSIS — Z7984 Long term (current) use of oral hypoglycemic drugs: Secondary | ICD-10-CM | POA: Diagnosis not present

## 2022-01-23 DIAGNOSIS — Z7985 Long-term (current) use of injectable non-insulin antidiabetic drugs: Secondary | ICD-10-CM | POA: Diagnosis not present

## 2022-01-24 DIAGNOSIS — I480 Paroxysmal atrial fibrillation: Secondary | ICD-10-CM | POA: Diagnosis not present

## 2022-01-24 DIAGNOSIS — I129 Hypertensive chronic kidney disease with stage 1 through stage 4 chronic kidney disease, or unspecified chronic kidney disease: Secondary | ICD-10-CM | POA: Diagnosis not present

## 2022-01-24 DIAGNOSIS — E1122 Type 2 diabetes mellitus with diabetic chronic kidney disease: Secondary | ICD-10-CM | POA: Diagnosis not present

## 2022-01-24 DIAGNOSIS — N189 Chronic kidney disease, unspecified: Secondary | ICD-10-CM | POA: Diagnosis not present

## 2022-01-24 DIAGNOSIS — Z7985 Long-term (current) use of injectable non-insulin antidiabetic drugs: Secondary | ICD-10-CM | POA: Diagnosis not present

## 2022-01-24 DIAGNOSIS — Z7984 Long term (current) use of oral hypoglycemic drugs: Secondary | ICD-10-CM | POA: Diagnosis not present

## 2022-01-31 DIAGNOSIS — E1122 Type 2 diabetes mellitus with diabetic chronic kidney disease: Secondary | ICD-10-CM | POA: Diagnosis not present

## 2022-01-31 DIAGNOSIS — Z7984 Long term (current) use of oral hypoglycemic drugs: Secondary | ICD-10-CM | POA: Diagnosis not present

## 2022-01-31 DIAGNOSIS — I129 Hypertensive chronic kidney disease with stage 1 through stage 4 chronic kidney disease, or unspecified chronic kidney disease: Secondary | ICD-10-CM | POA: Diagnosis not present

## 2022-01-31 DIAGNOSIS — Z7985 Long-term (current) use of injectable non-insulin antidiabetic drugs: Secondary | ICD-10-CM | POA: Diagnosis not present

## 2022-01-31 DIAGNOSIS — I480 Paroxysmal atrial fibrillation: Secondary | ICD-10-CM | POA: Diagnosis not present

## 2022-01-31 DIAGNOSIS — N189 Chronic kidney disease, unspecified: Secondary | ICD-10-CM | POA: Diagnosis not present

## 2022-02-07 DIAGNOSIS — E1122 Type 2 diabetes mellitus with diabetic chronic kidney disease: Secondary | ICD-10-CM | POA: Diagnosis not present

## 2022-02-07 DIAGNOSIS — Z7984 Long term (current) use of oral hypoglycemic drugs: Secondary | ICD-10-CM | POA: Diagnosis not present

## 2022-02-07 DIAGNOSIS — I129 Hypertensive chronic kidney disease with stage 1 through stage 4 chronic kidney disease, or unspecified chronic kidney disease: Secondary | ICD-10-CM | POA: Diagnosis not present

## 2022-02-07 DIAGNOSIS — Z7985 Long-term (current) use of injectable non-insulin antidiabetic drugs: Secondary | ICD-10-CM | POA: Diagnosis not present

## 2022-02-07 DIAGNOSIS — I10 Essential (primary) hypertension: Secondary | ICD-10-CM | POA: Diagnosis not present

## 2022-02-07 DIAGNOSIS — E039 Hypothyroidism, unspecified: Secondary | ICD-10-CM | POA: Diagnosis not present

## 2022-02-07 DIAGNOSIS — I480 Paroxysmal atrial fibrillation: Secondary | ICD-10-CM | POA: Diagnosis not present

## 2022-02-07 DIAGNOSIS — N189 Chronic kidney disease, unspecified: Secondary | ICD-10-CM | POA: Diagnosis not present

## 2022-02-07 DIAGNOSIS — E7800 Pure hypercholesterolemia, unspecified: Secondary | ICD-10-CM | POA: Diagnosis not present

## 2022-02-10 DIAGNOSIS — Z466 Encounter for fitting and adjustment of urinary device: Secondary | ICD-10-CM | POA: Diagnosis not present

## 2022-02-10 DIAGNOSIS — Z8673 Personal history of transient ischemic attack (TIA), and cerebral infarction without residual deficits: Secondary | ICD-10-CM | POA: Diagnosis not present

## 2022-02-10 DIAGNOSIS — Z5181 Encounter for therapeutic drug level monitoring: Secondary | ICD-10-CM | POA: Diagnosis not present

## 2022-02-10 DIAGNOSIS — Z85828 Personal history of other malignant neoplasm of skin: Secondary | ICD-10-CM | POA: Diagnosis not present

## 2022-02-10 DIAGNOSIS — Z7985 Long-term (current) use of injectable non-insulin antidiabetic drugs: Secondary | ICD-10-CM | POA: Diagnosis not present

## 2022-02-10 DIAGNOSIS — E1122 Type 2 diabetes mellitus with diabetic chronic kidney disease: Secondary | ICD-10-CM | POA: Diagnosis not present

## 2022-02-10 DIAGNOSIS — I480 Paroxysmal atrial fibrillation: Secondary | ICD-10-CM | POA: Diagnosis not present

## 2022-02-10 DIAGNOSIS — N189 Chronic kidney disease, unspecified: Secondary | ICD-10-CM | POA: Diagnosis not present

## 2022-02-10 DIAGNOSIS — K219 Gastro-esophageal reflux disease without esophagitis: Secondary | ICD-10-CM | POA: Diagnosis not present

## 2022-02-10 DIAGNOSIS — E039 Hypothyroidism, unspecified: Secondary | ICD-10-CM | POA: Diagnosis not present

## 2022-02-10 DIAGNOSIS — Z7984 Long term (current) use of oral hypoglycemic drugs: Secondary | ICD-10-CM | POA: Diagnosis not present

## 2022-02-10 DIAGNOSIS — E785 Hyperlipidemia, unspecified: Secondary | ICD-10-CM | POA: Diagnosis not present

## 2022-02-10 DIAGNOSIS — Z7901 Long term (current) use of anticoagulants: Secondary | ICD-10-CM | POA: Diagnosis not present

## 2022-02-10 DIAGNOSIS — I129 Hypertensive chronic kidney disease with stage 1 through stage 4 chronic kidney disease, or unspecified chronic kidney disease: Secondary | ICD-10-CM | POA: Diagnosis not present

## 2022-02-14 DIAGNOSIS — H9193 Unspecified hearing loss, bilateral: Secondary | ICD-10-CM | POA: Diagnosis not present

## 2022-02-14 DIAGNOSIS — I480 Paroxysmal atrial fibrillation: Secondary | ICD-10-CM | POA: Diagnosis not present

## 2022-02-14 DIAGNOSIS — Z7901 Long term (current) use of anticoagulants: Secondary | ICD-10-CM | POA: Diagnosis not present

## 2022-02-14 DIAGNOSIS — R609 Edema, unspecified: Secondary | ICD-10-CM | POA: Diagnosis not present

## 2022-02-14 DIAGNOSIS — E039 Hypothyroidism, unspecified: Secondary | ICD-10-CM | POA: Diagnosis not present

## 2022-02-14 DIAGNOSIS — N189 Chronic kidney disease, unspecified: Secondary | ICD-10-CM | POA: Diagnosis not present

## 2022-02-14 DIAGNOSIS — E7800 Pure hypercholesterolemia, unspecified: Secondary | ICD-10-CM | POA: Diagnosis not present

## 2022-02-14 DIAGNOSIS — I129 Hypertensive chronic kidney disease with stage 1 through stage 4 chronic kidney disease, or unspecified chronic kidney disease: Secondary | ICD-10-CM | POA: Diagnosis not present

## 2022-02-14 DIAGNOSIS — E1169 Type 2 diabetes mellitus with other specified complication: Secondary | ICD-10-CM | POA: Diagnosis not present

## 2022-02-14 DIAGNOSIS — E1122 Type 2 diabetes mellitus with diabetic chronic kidney disease: Secondary | ICD-10-CM | POA: Diagnosis not present

## 2022-02-14 DIAGNOSIS — Z466 Encounter for fitting and adjustment of urinary device: Secondary | ICD-10-CM | POA: Diagnosis not present

## 2022-02-14 DIAGNOSIS — K219 Gastro-esophageal reflux disease without esophagitis: Secondary | ICD-10-CM | POA: Diagnosis not present

## 2022-02-14 DIAGNOSIS — N184 Chronic kidney disease, stage 4 (severe): Secondary | ICD-10-CM | POA: Diagnosis not present

## 2022-02-14 DIAGNOSIS — D696 Thrombocytopenia, unspecified: Secondary | ICD-10-CM | POA: Diagnosis not present

## 2022-02-14 DIAGNOSIS — D649 Anemia, unspecified: Secondary | ICD-10-CM | POA: Diagnosis not present

## 2022-02-21 DIAGNOSIS — I129 Hypertensive chronic kidney disease with stage 1 through stage 4 chronic kidney disease, or unspecified chronic kidney disease: Secondary | ICD-10-CM | POA: Diagnosis not present

## 2022-02-21 DIAGNOSIS — Z466 Encounter for fitting and adjustment of urinary device: Secondary | ICD-10-CM | POA: Diagnosis not present

## 2022-02-21 DIAGNOSIS — Z7901 Long term (current) use of anticoagulants: Secondary | ICD-10-CM | POA: Diagnosis not present

## 2022-02-21 DIAGNOSIS — N189 Chronic kidney disease, unspecified: Secondary | ICD-10-CM | POA: Diagnosis not present

## 2022-02-21 DIAGNOSIS — I480 Paroxysmal atrial fibrillation: Secondary | ICD-10-CM | POA: Diagnosis not present

## 2022-02-21 DIAGNOSIS — E1122 Type 2 diabetes mellitus with diabetic chronic kidney disease: Secondary | ICD-10-CM | POA: Diagnosis not present

## 2022-02-28 DIAGNOSIS — Z7901 Long term (current) use of anticoagulants: Secondary | ICD-10-CM | POA: Diagnosis not present

## 2022-02-28 DIAGNOSIS — N189 Chronic kidney disease, unspecified: Secondary | ICD-10-CM | POA: Diagnosis not present

## 2022-02-28 DIAGNOSIS — Z466 Encounter for fitting and adjustment of urinary device: Secondary | ICD-10-CM | POA: Diagnosis not present

## 2022-02-28 DIAGNOSIS — I129 Hypertensive chronic kidney disease with stage 1 through stage 4 chronic kidney disease, or unspecified chronic kidney disease: Secondary | ICD-10-CM | POA: Diagnosis not present

## 2022-02-28 DIAGNOSIS — E1122 Type 2 diabetes mellitus with diabetic chronic kidney disease: Secondary | ICD-10-CM | POA: Diagnosis not present

## 2022-02-28 DIAGNOSIS — I480 Paroxysmal atrial fibrillation: Secondary | ICD-10-CM | POA: Diagnosis not present

## 2022-03-09 DIAGNOSIS — I10 Essential (primary) hypertension: Secondary | ICD-10-CM | POA: Diagnosis not present

## 2022-03-09 DIAGNOSIS — I129 Hypertensive chronic kidney disease with stage 1 through stage 4 chronic kidney disease, or unspecified chronic kidney disease: Secondary | ICD-10-CM | POA: Diagnosis not present

## 2022-03-09 DIAGNOSIS — Z466 Encounter for fitting and adjustment of urinary device: Secondary | ICD-10-CM | POA: Diagnosis not present

## 2022-03-09 DIAGNOSIS — I480 Paroxysmal atrial fibrillation: Secondary | ICD-10-CM | POA: Diagnosis not present

## 2022-03-09 DIAGNOSIS — N184 Chronic kidney disease, stage 4 (severe): Secondary | ICD-10-CM | POA: Diagnosis not present

## 2022-03-09 DIAGNOSIS — Z7901 Long term (current) use of anticoagulants: Secondary | ICD-10-CM | POA: Diagnosis not present

## 2022-03-09 DIAGNOSIS — E7800 Pure hypercholesterolemia, unspecified: Secondary | ICD-10-CM | POA: Diagnosis not present

## 2022-03-09 DIAGNOSIS — E1122 Type 2 diabetes mellitus with diabetic chronic kidney disease: Secondary | ICD-10-CM | POA: Diagnosis not present

## 2022-03-09 DIAGNOSIS — N189 Chronic kidney disease, unspecified: Secondary | ICD-10-CM | POA: Diagnosis not present

## 2022-03-12 DIAGNOSIS — E1122 Type 2 diabetes mellitus with diabetic chronic kidney disease: Secondary | ICD-10-CM | POA: Diagnosis not present

## 2022-03-12 DIAGNOSIS — Z7901 Long term (current) use of anticoagulants: Secondary | ICD-10-CM | POA: Diagnosis not present

## 2022-03-12 DIAGNOSIS — I129 Hypertensive chronic kidney disease with stage 1 through stage 4 chronic kidney disease, or unspecified chronic kidney disease: Secondary | ICD-10-CM | POA: Diagnosis not present

## 2022-03-12 DIAGNOSIS — N189 Chronic kidney disease, unspecified: Secondary | ICD-10-CM | POA: Diagnosis not present

## 2022-03-12 DIAGNOSIS — E785 Hyperlipidemia, unspecified: Secondary | ICD-10-CM | POA: Diagnosis not present

## 2022-03-12 DIAGNOSIS — Z8673 Personal history of transient ischemic attack (TIA), and cerebral infarction without residual deficits: Secondary | ICD-10-CM | POA: Diagnosis not present

## 2022-03-12 DIAGNOSIS — E039 Hypothyroidism, unspecified: Secondary | ICD-10-CM | POA: Diagnosis not present

## 2022-03-12 DIAGNOSIS — Z466 Encounter for fitting and adjustment of urinary device: Secondary | ICD-10-CM | POA: Diagnosis not present

## 2022-03-12 DIAGNOSIS — Z7985 Long-term (current) use of injectable non-insulin antidiabetic drugs: Secondary | ICD-10-CM | POA: Diagnosis not present

## 2022-03-12 DIAGNOSIS — Z7984 Long term (current) use of oral hypoglycemic drugs: Secondary | ICD-10-CM | POA: Diagnosis not present

## 2022-03-12 DIAGNOSIS — Z85828 Personal history of other malignant neoplasm of skin: Secondary | ICD-10-CM | POA: Diagnosis not present

## 2022-03-12 DIAGNOSIS — I480 Paroxysmal atrial fibrillation: Secondary | ICD-10-CM | POA: Diagnosis not present

## 2022-03-12 DIAGNOSIS — K219 Gastro-esophageal reflux disease without esophagitis: Secondary | ICD-10-CM | POA: Diagnosis not present

## 2022-03-12 DIAGNOSIS — Z5181 Encounter for therapeutic drug level monitoring: Secondary | ICD-10-CM | POA: Diagnosis not present

## 2022-03-14 DIAGNOSIS — N189 Chronic kidney disease, unspecified: Secondary | ICD-10-CM | POA: Diagnosis not present

## 2022-03-14 DIAGNOSIS — I129 Hypertensive chronic kidney disease with stage 1 through stage 4 chronic kidney disease, or unspecified chronic kidney disease: Secondary | ICD-10-CM | POA: Diagnosis not present

## 2022-03-14 DIAGNOSIS — I480 Paroxysmal atrial fibrillation: Secondary | ICD-10-CM | POA: Diagnosis not present

## 2022-03-14 DIAGNOSIS — Z7901 Long term (current) use of anticoagulants: Secondary | ICD-10-CM | POA: Diagnosis not present

## 2022-03-14 DIAGNOSIS — Z466 Encounter for fitting and adjustment of urinary device: Secondary | ICD-10-CM | POA: Diagnosis not present

## 2022-03-14 DIAGNOSIS — E1122 Type 2 diabetes mellitus with diabetic chronic kidney disease: Secondary | ICD-10-CM | POA: Diagnosis not present

## 2022-03-21 ENCOUNTER — Ambulatory Visit: Payer: Medicare Other | Attending: Cardiovascular Disease

## 2022-03-21 DIAGNOSIS — I495 Sick sinus syndrome: Secondary | ICD-10-CM | POA: Diagnosis not present

## 2022-03-21 DIAGNOSIS — Z7901 Long term (current) use of anticoagulants: Secondary | ICD-10-CM | POA: Diagnosis not present

## 2022-03-21 DIAGNOSIS — E1122 Type 2 diabetes mellitus with diabetic chronic kidney disease: Secondary | ICD-10-CM | POA: Diagnosis not present

## 2022-03-21 DIAGNOSIS — N189 Chronic kidney disease, unspecified: Secondary | ICD-10-CM | POA: Diagnosis not present

## 2022-03-21 DIAGNOSIS — Z466 Encounter for fitting and adjustment of urinary device: Secondary | ICD-10-CM | POA: Diagnosis not present

## 2022-03-21 DIAGNOSIS — I129 Hypertensive chronic kidney disease with stage 1 through stage 4 chronic kidney disease, or unspecified chronic kidney disease: Secondary | ICD-10-CM | POA: Diagnosis not present

## 2022-03-21 DIAGNOSIS — I480 Paroxysmal atrial fibrillation: Secondary | ICD-10-CM | POA: Diagnosis not present

## 2022-03-21 LAB — CUP PACEART REMOTE DEVICE CHECK
Battery Remaining Longevity: 61 mo
Battery Voltage: 2.97 V
Brady Statistic AP VP Percent: 99.98 %
Brady Statistic AP VS Percent: 0 %
Brady Statistic AS VP Percent: 0 %
Brady Statistic AS VS Percent: 0.02 %
Brady Statistic RA Percent Paced: 100 %
Brady Statistic RV Percent Paced: 99.98 %
Date Time Interrogation Session: 20240123021114
Implantable Lead Connection Status: 753985
Implantable Lead Connection Status: 753985
Implantable Lead Implant Date: 19990907
Implantable Lead Implant Date: 19990907
Implantable Lead Location: 753859
Implantable Lead Location: 753860
Implantable Lead Serial Number: 32904
Implantable Lead Serial Number: 33421
Implantable Pulse Generator Implant Date: 20211014
Lead Channel Impedance Value: 228 Ohm
Lead Channel Impedance Value: 247 Ohm
Lead Channel Impedance Value: 247 Ohm
Lead Channel Impedance Value: 266 Ohm
Lead Channel Pacing Threshold Amplitude: 0.875 V
Lead Channel Pacing Threshold Pulse Width: 0.4 ms
Lead Channel Sensing Intrinsic Amplitude: 12 mV
Lead Channel Sensing Intrinsic Amplitude: 12 mV
Lead Channel Setting Pacing Amplitude: 2 V
Lead Channel Setting Pacing Amplitude: 2.5 V
Lead Channel Setting Pacing Pulse Width: 0.4 ms
Lead Channel Setting Sensing Sensitivity: 8 mV
Zone Setting Status: 755011
Zone Setting Status: 755011

## 2022-03-28 DIAGNOSIS — I129 Hypertensive chronic kidney disease with stage 1 through stage 4 chronic kidney disease, or unspecified chronic kidney disease: Secondary | ICD-10-CM | POA: Diagnosis not present

## 2022-03-28 DIAGNOSIS — N189 Chronic kidney disease, unspecified: Secondary | ICD-10-CM | POA: Diagnosis not present

## 2022-03-28 DIAGNOSIS — I480 Paroxysmal atrial fibrillation: Secondary | ICD-10-CM | POA: Diagnosis not present

## 2022-03-28 DIAGNOSIS — Z466 Encounter for fitting and adjustment of urinary device: Secondary | ICD-10-CM | POA: Diagnosis not present

## 2022-03-28 DIAGNOSIS — E1122 Type 2 diabetes mellitus with diabetic chronic kidney disease: Secondary | ICD-10-CM | POA: Diagnosis not present

## 2022-03-28 DIAGNOSIS — Z7901 Long term (current) use of anticoagulants: Secondary | ICD-10-CM | POA: Diagnosis not present

## 2022-04-03 DIAGNOSIS — Z7901 Long term (current) use of anticoagulants: Secondary | ICD-10-CM | POA: Diagnosis not present

## 2022-04-03 DIAGNOSIS — I480 Paroxysmal atrial fibrillation: Secondary | ICD-10-CM | POA: Diagnosis not present

## 2022-04-03 DIAGNOSIS — I129 Hypertensive chronic kidney disease with stage 1 through stage 4 chronic kidney disease, or unspecified chronic kidney disease: Secondary | ICD-10-CM | POA: Diagnosis not present

## 2022-04-03 DIAGNOSIS — E1122 Type 2 diabetes mellitus with diabetic chronic kidney disease: Secondary | ICD-10-CM | POA: Diagnosis not present

## 2022-04-03 DIAGNOSIS — N189 Chronic kidney disease, unspecified: Secondary | ICD-10-CM | POA: Diagnosis not present

## 2022-04-03 DIAGNOSIS — Z466 Encounter for fitting and adjustment of urinary device: Secondary | ICD-10-CM | POA: Diagnosis not present

## 2022-04-10 DIAGNOSIS — Z466 Encounter for fitting and adjustment of urinary device: Secondary | ICD-10-CM | POA: Diagnosis not present

## 2022-04-10 DIAGNOSIS — E1122 Type 2 diabetes mellitus with diabetic chronic kidney disease: Secondary | ICD-10-CM | POA: Diagnosis not present

## 2022-04-10 DIAGNOSIS — I480 Paroxysmal atrial fibrillation: Secondary | ICD-10-CM | POA: Diagnosis not present

## 2022-04-10 DIAGNOSIS — I129 Hypertensive chronic kidney disease with stage 1 through stage 4 chronic kidney disease, or unspecified chronic kidney disease: Secondary | ICD-10-CM | POA: Diagnosis not present

## 2022-04-10 DIAGNOSIS — N189 Chronic kidney disease, unspecified: Secondary | ICD-10-CM | POA: Diagnosis not present

## 2022-04-10 DIAGNOSIS — Z7901 Long term (current) use of anticoagulants: Secondary | ICD-10-CM | POA: Diagnosis not present

## 2022-04-11 DIAGNOSIS — K219 Gastro-esophageal reflux disease without esophagitis: Secondary | ICD-10-CM | POA: Diagnosis not present

## 2022-04-11 DIAGNOSIS — Z5181 Encounter for therapeutic drug level monitoring: Secondary | ICD-10-CM | POA: Diagnosis not present

## 2022-04-11 DIAGNOSIS — E039 Hypothyroidism, unspecified: Secondary | ICD-10-CM | POA: Diagnosis not present

## 2022-04-11 DIAGNOSIS — Z7901 Long term (current) use of anticoagulants: Secondary | ICD-10-CM | POA: Diagnosis not present

## 2022-04-11 DIAGNOSIS — Z7984 Long term (current) use of oral hypoglycemic drugs: Secondary | ICD-10-CM | POA: Diagnosis not present

## 2022-04-11 DIAGNOSIS — E1122 Type 2 diabetes mellitus with diabetic chronic kidney disease: Secondary | ICD-10-CM | POA: Diagnosis not present

## 2022-04-11 DIAGNOSIS — Z85828 Personal history of other malignant neoplasm of skin: Secondary | ICD-10-CM | POA: Diagnosis not present

## 2022-04-11 DIAGNOSIS — N189 Chronic kidney disease, unspecified: Secondary | ICD-10-CM | POA: Diagnosis not present

## 2022-04-11 DIAGNOSIS — E785 Hyperlipidemia, unspecified: Secondary | ICD-10-CM | POA: Diagnosis not present

## 2022-04-11 DIAGNOSIS — I480 Paroxysmal atrial fibrillation: Secondary | ICD-10-CM | POA: Diagnosis not present

## 2022-04-11 DIAGNOSIS — Z8673 Personal history of transient ischemic attack (TIA), and cerebral infarction without residual deficits: Secondary | ICD-10-CM | POA: Diagnosis not present

## 2022-04-11 DIAGNOSIS — Z7985 Long-term (current) use of injectable non-insulin antidiabetic drugs: Secondary | ICD-10-CM | POA: Diagnosis not present

## 2022-04-11 DIAGNOSIS — I129 Hypertensive chronic kidney disease with stage 1 through stage 4 chronic kidney disease, or unspecified chronic kidney disease: Secondary | ICD-10-CM | POA: Diagnosis not present

## 2022-04-11 DIAGNOSIS — Z466 Encounter for fitting and adjustment of urinary device: Secondary | ICD-10-CM | POA: Diagnosis not present

## 2022-04-13 ENCOUNTER — Emergency Department (HOSPITAL_COMMUNITY)
Admission: EM | Admit: 2022-04-13 | Discharge: 2022-04-13 | Disposition: A | Discharge status: Home / Self Care | Payer: Medicare Other | Attending: Emergency Medicine | Admitting: Emergency Medicine

## 2022-04-13 DIAGNOSIS — N189 Chronic kidney disease, unspecified: Secondary | ICD-10-CM | POA: Insufficient documentation

## 2022-04-13 DIAGNOSIS — R739 Hyperglycemia, unspecified: Secondary | ICD-10-CM | POA: Diagnosis not present

## 2022-04-13 DIAGNOSIS — I10 Essential (primary) hypertension: Secondary | ICD-10-CM | POA: Diagnosis not present

## 2022-04-13 DIAGNOSIS — R319 Hematuria, unspecified: Secondary | ICD-10-CM | POA: Diagnosis not present

## 2022-04-13 DIAGNOSIS — I129 Hypertensive chronic kidney disease with stage 1 through stage 4 chronic kidney disease, or unspecified chronic kidney disease: Secondary | ICD-10-CM | POA: Diagnosis not present

## 2022-04-13 DIAGNOSIS — I480 Paroxysmal atrial fibrillation: Secondary | ICD-10-CM | POA: Diagnosis not present

## 2022-04-13 DIAGNOSIS — N390 Urinary tract infection, site not specified: Secondary | ICD-10-CM | POA: Diagnosis not present

## 2022-04-13 DIAGNOSIS — E1122 Type 2 diabetes mellitus with diabetic chronic kidney disease: Secondary | ICD-10-CM | POA: Diagnosis not present

## 2022-04-13 DIAGNOSIS — Z7901 Long term (current) use of anticoagulants: Secondary | ICD-10-CM | POA: Diagnosis not present

## 2022-04-13 DIAGNOSIS — Z466 Encounter for fitting and adjustment of urinary device: Secondary | ICD-10-CM | POA: Diagnosis not present

## 2022-04-13 LAB — URINALYSIS, ROUTINE W REFLEX MICROSCOPIC
Bilirubin Urine: NEGATIVE
Glucose, UA: NEGATIVE mg/dL
Ketones, ur: NEGATIVE mg/dL
Nitrite: NEGATIVE
Protein, ur: NEGATIVE mg/dL
RBC / HPF: >50 RBC/hpf (ref 0–5)
Specific Gravity, Urine: 1.008 (ref 1.005–1.030)
WBC, UA: >50 WBC/hpf (ref 0–5)
pH: 5 (ref 5.0–8.0)

## 2022-04-13 LAB — CBC WITH DIFFERENTIAL/PLATELET
Abs Immature Granulocytes: 0.02 10*3/uL (ref 0.00–0.07)
Basophils Absolute: 0 10*3/uL (ref 0.0–0.1)
Basophils Relative: 0 %
Eosinophils Absolute: 0.4 10*3/uL (ref 0.0–0.5)
Eosinophils Relative: 7 %
HCT: 31.8 % — ABNORMAL LOW (ref 36.0–46.0)
Hemoglobin: 9.9 g/dL — ABNORMAL LOW (ref 12.0–15.0)
Immature Granulocytes: 0 %
Lymphocytes Relative: 9 %
Lymphs Abs: 0.5 10*3/uL — ABNORMAL LOW (ref 0.7–4.0)
MCH: 27.7 pg (ref 26.0–34.0)
MCHC: 31.1 g/dL (ref 30.0–36.0)
MCV: 89.1 fL (ref 80.0–100.0)
Monocytes Absolute: 0.3 10*3/uL (ref 0.1–1.0)
Monocytes Relative: 6 %
Neutro Abs: 4.1 10*3/uL (ref 1.7–7.7)
Neutrophils Relative %: 78 %
Platelets: 111 10*3/uL — ABNORMAL LOW (ref 150–400)
RBC: 3.57 MIL/uL — ABNORMAL LOW (ref 3.87–5.11)
RDW: 16.9 % — ABNORMAL HIGH (ref 11.5–15.5)
WBC: 5.3 10*3/uL (ref 4.0–10.5)
nRBC: 0 % (ref 0.0–0.2)

## 2022-04-13 LAB — COMPREHENSIVE METABOLIC PANEL
ALT: 19 U/L (ref 0–44)
AST: 28 U/L (ref 15–41)
Albumin: 3.5 g/dL (ref 3.5–5.0)
Alkaline Phosphatase: 62 U/L (ref 38–126)
Anion gap: 12 (ref 5–15)
BUN: 56 mg/dL — ABNORMAL HIGH (ref 8–23)
CO2: 23 mmol/L (ref 22–32)
Calcium: 9 mg/dL (ref 8.9–10.3)
Chloride: 106 mmol/L (ref 98–111)
Creatinine, Ser: 2.04 mg/dL — ABNORMAL HIGH (ref 0.44–1.00)
GFR, Estimated: 23 mL/min — ABNORMAL LOW (ref >60)
Glucose, Bld: 236 mg/dL — ABNORMAL HIGH (ref 70–99)
Potassium: 4.1 mmol/L (ref 3.5–5.1)
Sodium: 141 mmol/L (ref 135–145)
Total Bilirubin: 0.8 mg/dL (ref 0.3–1.2)
Total Protein: 7.4 g/dL (ref 6.5–8.1)

## 2022-04-13 LAB — LIPASE, BLOOD: Lipase: 81 U/L — ABNORMAL HIGH (ref 11–51)

## 2022-04-13 MED ORDER — CEFDINIR 300 MG PO CAPS: 300.0000 mg | ORAL_CAPSULE | Freq: Two times a day (BID) | ORAL | 0 refills | Status: DC

## 2022-04-13 MED ORDER — CEFDINIR 300 MG PO CAPS
300.0000 mg | ORAL_CAPSULE | Freq: Once | ORAL | Status: AC
  Administered 2022-04-13: 300 mg via ORAL
  Filled 2022-04-13: qty 1

## 2022-04-13 MED ORDER — CIPROFLOXACIN HCL 500 MG PO TABS: 250.0000 mg | ORAL_TABLET | Freq: Once | ORAL | Status: DC

## 2022-04-13 NOTE — ED Notes (Addendum)
Pt provided discharge instructions and prescription information. Pt was given the opportunity to ask questions and questions were answered.   

## 2022-04-13 NOTE — ED Provider Notes (Signed)
[facility] Provider Note   CSN: [id number] Arrival date & time: 04/13/22  [id number]     History Chief Complaint  Patient presents with   Hematuria    HPI Tiffany [name] is a 89 y.o. female presenting for chief complaint of discoloration of her urine bag.  States that her Foley was replaced 4 days ago and yesterday she started noticing discoloration in the bag.  Overnight she states that she had chills and felt like she could not get warm. Denies any acute pain or discomfort but states she has had multiple urinary tract infections without discomfort.  Patient is worried she has a recurrent infection based on her symptoms. Patient's recorded medical, surgical, social, medication list and allergies were reviewed in the Snapshot window as part of the initial history.   Review of Systems   Review of Systems  Constitutional:  Positive for chills and fatigue. Negative for fever.  HENT:  Negative for ear pain and sore throat.   Eyes:  Negative for pain and visual disturbance.  Respiratory:  Negative for cough and shortness of breath.   Cardiovascular:  Negative for chest pain and palpitations.  Gastrointestinal:  Negative for abdominal pain and vomiting.  Genitourinary:  Positive for hematuria. Negative for dysuria.  Musculoskeletal:  Negative for arthralgias and back pain.  Skin:  Negative for color change and rash.  Neurological:  Negative for seizures and syncope.  All other systems reviewed and are negative.   Physical Exam Updated Vital Signs BP (!) 161/57   Pulse 72   Temp 98 F (36.7 C) (Oral)   Resp 18   SpO2 99%  Physical Exam Vitals and nursing note reviewed.  Constitutional:      General: She is not in acute distress.    Appearance: She is well-developed.  HENT:     Head: Normocephalic and atraumatic.  Eyes:     Conjunctiva/sclera: Conjunctivae normal.  Cardiovascular:     Rate and Rhythm: Normal rate and regular  rhythm.     Heart sounds: No murmur heard. Pulmonary:     Effort: Pulmonary effort is normal. No respiratory distress.     Breath sounds: Normal breath sounds.  Abdominal:     General: There is no distension.     Palpations: Abdomen is soft.     Tenderness: There is no abdominal tenderness. There is no right CVA tenderness or left CVA tenderness.  Musculoskeletal:        General: No swelling or tenderness. Normal range of motion.     Cervical back: Neck supple.  Skin:    General: Skin is warm and dry.  Neurological:     General: No focal deficit present.     Mental Status: She is alert and oriented to person, place, and time. Mental status is at baseline.     Cranial Nerves: No cranial nerve deficit.      ED Course/ Medical Decision Making/ A&P    Procedures Procedures   Medications Ordered in ED Medications  cefdinir (OMNICEF) capsule 300 mg (has no administration in time range)    Medical Decision Making:    Chaise Tewalt is a 89 y.o. female who presented to the ED today with hematuria detailed above.     Handoff received from EMS.  Patient's presentation is complicated by their history of multiple comorbid medical problems.  Patient placed on continuous vitals and telemetry monitoring while in ED which was reviewed periodically.   Complete  initial physical exam performed, notably the patient  was hemodynamically stable in no acute distress.      Reviewed and confirmed nursing documentation for past medical history, family history, social history.    Initial Assessment:   With the patient's presentation of urinary discoloration, most likely diagnosis is urinary tract infection. Other diagnoses were considered including (but not limited to) medical replacement 2 days ago, renal/bladder cancer. These are considered less likely due to history of present illness and physical exam findings.   This is most consistent with an acute life/limb threatening illness  complicated by underlying chronic conditions.  Initial Plan:  Screening labs including CBC and Metabolic panel to evaluate for infectious or metabolic etiology of disease.  Urinalysis with reflex culture ordered to evaluate for UTI or relevant urologic/nephrologic pathology.  Objective evaluation as below reviewed with plan for close reassessment  Initial Study Results:   Laboratory  CKD is stable, urine with bacteriuria and leukocyturia  Final Assessment and Plan:   Reevaluated at bedside.  She is grossly hemodynamically stable.  This is likely an ongoing urinary tract infection.  Will broaden patient to [facility] based on prior culture reports she will take it twice daily for the next 5 days.  Plan for close follow-up with PCP for repeat urine testing at that time.   Disposition:  I have considered need for hospitalization, however, considering all of the above, I believe this patient is stable for discharge at this time.  Patient/family educated about specific return precautions for given chief complaint and symptoms.  Patient/family educated about follow-up with PCP.     Patient/family expressed understanding of return precautions and need for follow-up. Patient spoken to regarding all imaging and laboratory results and appropriate follow up for these results. All education provided in verbal form with additional information in written form. Time was allowed for answering of patient questions. Patient discharged.    Emergency Department Medication Summary:   Medications  cefdinir (OMNICEF) capsule 300 mg (has no administration in time range)         Clinical Impression:  1. Lower urinary tract infectious disease      Data Unavailable   Final Clinical Impression(s) / ED Diagnoses Final diagnoses:  Lower urinary tract infectious disease    Rx / DC Orders ED Discharge Orders          Ordered    cefdinir (OMNICEF) 300 MG capsule  2 times daily        04/13/22 1403               Tretha Sciara, MD 04/13/22 1653

## 2022-04-13 NOTE — ED Triage Notes (Signed)
From home. Chronic foley catheter in place, noticed some blood in bag yesterday. Today woke up with more blood in urine. Denies any pain.

## 2022-04-14 DIAGNOSIS — I480 Paroxysmal atrial fibrillation: Secondary | ICD-10-CM | POA: Diagnosis not present

## 2022-04-14 DIAGNOSIS — I10 Essential (primary) hypertension: Secondary | ICD-10-CM | POA: Diagnosis not present

## 2022-04-14 DIAGNOSIS — E1122 Type 2 diabetes mellitus with diabetic chronic kidney disease: Secondary | ICD-10-CM | POA: Diagnosis not present

## 2022-04-14 DIAGNOSIS — E7800 Pure hypercholesterolemia, unspecified: Secondary | ICD-10-CM | POA: Diagnosis not present

## 2022-04-17 DIAGNOSIS — I480 Paroxysmal atrial fibrillation: Secondary | ICD-10-CM | POA: Diagnosis not present

## 2022-04-17 DIAGNOSIS — E1122 Type 2 diabetes mellitus with diabetic chronic kidney disease: Secondary | ICD-10-CM | POA: Diagnosis not present

## 2022-04-17 DIAGNOSIS — N189 Chronic kidney disease, unspecified: Secondary | ICD-10-CM | POA: Diagnosis not present

## 2022-04-17 DIAGNOSIS — I129 Hypertensive chronic kidney disease with stage 1 through stage 4 chronic kidney disease, or unspecified chronic kidney disease: Secondary | ICD-10-CM | POA: Diagnosis not present

## 2022-04-17 DIAGNOSIS — Z466 Encounter for fitting and adjustment of urinary device: Secondary | ICD-10-CM | POA: Diagnosis not present

## 2022-04-17 DIAGNOSIS — Z7901 Long term (current) use of anticoagulants: Secondary | ICD-10-CM | POA: Diagnosis not present

## 2022-04-18 NOTE — Progress Notes (Signed)
Remote pacemaker transmission.   

## 2022-04-24 DIAGNOSIS — E1122 Type 2 diabetes mellitus with diabetic chronic kidney disease: Secondary | ICD-10-CM | POA: Diagnosis not present

## 2022-04-24 DIAGNOSIS — I480 Paroxysmal atrial fibrillation: Secondary | ICD-10-CM | POA: Diagnosis not present

## 2022-04-24 DIAGNOSIS — I129 Hypertensive chronic kidney disease with stage 1 through stage 4 chronic kidney disease, or unspecified chronic kidney disease: Secondary | ICD-10-CM | POA: Diagnosis not present

## 2022-04-24 DIAGNOSIS — N189 Chronic kidney disease, unspecified: Secondary | ICD-10-CM | POA: Diagnosis not present

## 2022-04-24 DIAGNOSIS — Z7901 Long term (current) use of anticoagulants: Secondary | ICD-10-CM | POA: Diagnosis not present

## 2022-04-24 DIAGNOSIS — Z466 Encounter for fitting and adjustment of urinary device: Secondary | ICD-10-CM | POA: Diagnosis not present

## 2022-05-01 DIAGNOSIS — N189 Chronic kidney disease, unspecified: Secondary | ICD-10-CM | POA: Diagnosis not present

## 2022-05-01 DIAGNOSIS — I129 Hypertensive chronic kidney disease with stage 1 through stage 4 chronic kidney disease, or unspecified chronic kidney disease: Secondary | ICD-10-CM | POA: Diagnosis not present

## 2022-05-01 DIAGNOSIS — I480 Paroxysmal atrial fibrillation: Secondary | ICD-10-CM | POA: Diagnosis not present

## 2022-05-01 DIAGNOSIS — Z7901 Long term (current) use of anticoagulants: Secondary | ICD-10-CM | POA: Diagnosis not present

## 2022-05-01 DIAGNOSIS — E1122 Type 2 diabetes mellitus with diabetic chronic kidney disease: Secondary | ICD-10-CM | POA: Diagnosis not present

## 2022-05-01 DIAGNOSIS — Z466 Encounter for fitting and adjustment of urinary device: Secondary | ICD-10-CM | POA: Diagnosis not present

## 2022-05-08 DIAGNOSIS — E1122 Type 2 diabetes mellitus with diabetic chronic kidney disease: Secondary | ICD-10-CM | POA: Diagnosis not present

## 2022-05-08 DIAGNOSIS — N189 Chronic kidney disease, unspecified: Secondary | ICD-10-CM | POA: Diagnosis not present

## 2022-05-08 DIAGNOSIS — Z466 Encounter for fitting and adjustment of urinary device: Secondary | ICD-10-CM | POA: Diagnosis not present

## 2022-05-08 DIAGNOSIS — I480 Paroxysmal atrial fibrillation: Secondary | ICD-10-CM | POA: Diagnosis not present

## 2022-05-08 DIAGNOSIS — Z7901 Long term (current) use of anticoagulants: Secondary | ICD-10-CM | POA: Diagnosis not present

## 2022-05-08 DIAGNOSIS — I129 Hypertensive chronic kidney disease with stage 1 through stage 4 chronic kidney disease, or unspecified chronic kidney disease: Secondary | ICD-10-CM | POA: Diagnosis not present

## 2022-05-09 DIAGNOSIS — E1122 Type 2 diabetes mellitus with diabetic chronic kidney disease: Secondary | ICD-10-CM | POA: Diagnosis not present

## 2022-05-09 DIAGNOSIS — I10 Essential (primary) hypertension: Secondary | ICD-10-CM | POA: Diagnosis not present

## 2022-05-09 DIAGNOSIS — E7800 Pure hypercholesterolemia, unspecified: Secondary | ICD-10-CM | POA: Diagnosis not present

## 2022-05-09 DIAGNOSIS — I480 Paroxysmal atrial fibrillation: Secondary | ICD-10-CM | POA: Diagnosis not present

## 2022-05-11 DIAGNOSIS — Z466 Encounter for fitting and adjustment of urinary device: Secondary | ICD-10-CM | POA: Diagnosis not present

## 2022-05-11 DIAGNOSIS — E113291 Type 2 diabetes mellitus with mild nonproliferative diabetic retinopathy without macular edema, right eye: Secondary | ICD-10-CM | POA: Diagnosis not present

## 2022-05-11 DIAGNOSIS — E1122 Type 2 diabetes mellitus with diabetic chronic kidney disease: Secondary | ICD-10-CM | POA: Diagnosis not present

## 2022-05-11 DIAGNOSIS — I480 Paroxysmal atrial fibrillation: Secondary | ICD-10-CM | POA: Diagnosis not present

## 2022-05-11 DIAGNOSIS — Z7985 Long-term (current) use of injectable non-insulin antidiabetic drugs: Secondary | ICD-10-CM | POA: Diagnosis not present

## 2022-05-11 DIAGNOSIS — Z7984 Long term (current) use of oral hypoglycemic drugs: Secondary | ICD-10-CM | POA: Diagnosis not present

## 2022-05-11 DIAGNOSIS — Z85828 Personal history of other malignant neoplasm of skin: Secondary | ICD-10-CM | POA: Diagnosis not present

## 2022-05-11 DIAGNOSIS — Z5181 Encounter for therapeutic drug level monitoring: Secondary | ICD-10-CM | POA: Diagnosis not present

## 2022-05-11 DIAGNOSIS — I129 Hypertensive chronic kidney disease with stage 1 through stage 4 chronic kidney disease, or unspecified chronic kidney disease: Secondary | ICD-10-CM | POA: Diagnosis not present

## 2022-05-11 DIAGNOSIS — E785 Hyperlipidemia, unspecified: Secondary | ICD-10-CM | POA: Diagnosis not present

## 2022-05-11 DIAGNOSIS — E039 Hypothyroidism, unspecified: Secondary | ICD-10-CM | POA: Diagnosis not present

## 2022-05-11 DIAGNOSIS — E113392 Type 2 diabetes mellitus with moderate nonproliferative diabetic retinopathy without macular edema, left eye: Secondary | ICD-10-CM | POA: Diagnosis not present

## 2022-05-11 DIAGNOSIS — Z7901 Long term (current) use of anticoagulants: Secondary | ICD-10-CM | POA: Diagnosis not present

## 2022-05-11 DIAGNOSIS — K219 Gastro-esophageal reflux disease without esophagitis: Secondary | ICD-10-CM | POA: Diagnosis not present

## 2022-05-11 DIAGNOSIS — Z8673 Personal history of transient ischemic attack (TIA), and cerebral infarction without residual deficits: Secondary | ICD-10-CM | POA: Diagnosis not present

## 2022-05-11 DIAGNOSIS — N189 Chronic kidney disease, unspecified: Secondary | ICD-10-CM | POA: Diagnosis not present

## 2022-05-15 DIAGNOSIS — N189 Chronic kidney disease, unspecified: Secondary | ICD-10-CM | POA: Diagnosis not present

## 2022-05-15 DIAGNOSIS — N184 Chronic kidney disease, stage 4 (severe): Secondary | ICD-10-CM | POA: Diagnosis not present

## 2022-05-15 DIAGNOSIS — N2581 Secondary hyperparathyroidism of renal origin: Secondary | ICD-10-CM | POA: Diagnosis not present

## 2022-05-15 DIAGNOSIS — D631 Anemia in chronic kidney disease: Secondary | ICD-10-CM | POA: Diagnosis not present

## 2022-05-15 DIAGNOSIS — I129 Hypertensive chronic kidney disease with stage 1 through stage 4 chronic kidney disease, or unspecified chronic kidney disease: Secondary | ICD-10-CM | POA: Diagnosis not present

## 2022-05-15 DIAGNOSIS — I4891 Unspecified atrial fibrillation: Secondary | ICD-10-CM | POA: Diagnosis not present

## 2022-05-16 DIAGNOSIS — Z7901 Long term (current) use of anticoagulants: Secondary | ICD-10-CM | POA: Diagnosis not present

## 2022-05-16 DIAGNOSIS — I480 Paroxysmal atrial fibrillation: Secondary | ICD-10-CM | POA: Diagnosis not present

## 2022-05-16 DIAGNOSIS — E1122 Type 2 diabetes mellitus with diabetic chronic kidney disease: Secondary | ICD-10-CM | POA: Diagnosis not present

## 2022-05-16 DIAGNOSIS — I129 Hypertensive chronic kidney disease with stage 1 through stage 4 chronic kidney disease, or unspecified chronic kidney disease: Secondary | ICD-10-CM | POA: Diagnosis not present

## 2022-05-16 DIAGNOSIS — N189 Chronic kidney disease, unspecified: Secondary | ICD-10-CM | POA: Diagnosis not present

## 2022-05-16 DIAGNOSIS — Z466 Encounter for fitting and adjustment of urinary device: Secondary | ICD-10-CM | POA: Diagnosis not present

## 2022-05-18 DIAGNOSIS — K219 Gastro-esophageal reflux disease without esophagitis: Secondary | ICD-10-CM | POA: Diagnosis not present

## 2022-05-18 DIAGNOSIS — H9193 Unspecified hearing loss, bilateral: Secondary | ICD-10-CM | POA: Diagnosis not present

## 2022-05-18 DIAGNOSIS — E1169 Type 2 diabetes mellitus with other specified complication: Secondary | ICD-10-CM | POA: Diagnosis not present

## 2022-05-18 DIAGNOSIS — D649 Anemia, unspecified: Secondary | ICD-10-CM | POA: Diagnosis not present

## 2022-05-18 DIAGNOSIS — D696 Thrombocytopenia, unspecified: Secondary | ICD-10-CM | POA: Diagnosis not present

## 2022-05-18 DIAGNOSIS — I129 Hypertensive chronic kidney disease with stage 1 through stage 4 chronic kidney disease, or unspecified chronic kidney disease: Secondary | ICD-10-CM | POA: Diagnosis not present

## 2022-05-18 DIAGNOSIS — D6869 Other thrombophilia: Secondary | ICD-10-CM | POA: Diagnosis not present

## 2022-05-18 DIAGNOSIS — R609 Edema, unspecified: Secondary | ICD-10-CM | POA: Diagnosis not present

## 2022-05-18 DIAGNOSIS — I480 Paroxysmal atrial fibrillation: Secondary | ICD-10-CM | POA: Diagnosis not present

## 2022-05-18 DIAGNOSIS — E7800 Pure hypercholesterolemia, unspecified: Secondary | ICD-10-CM | POA: Diagnosis not present

## 2022-05-18 DIAGNOSIS — E039 Hypothyroidism, unspecified: Secondary | ICD-10-CM | POA: Diagnosis not present

## 2022-05-18 DIAGNOSIS — N184 Chronic kidney disease, stage 4 (severe): Secondary | ICD-10-CM | POA: Diagnosis not present

## 2022-05-23 DIAGNOSIS — I129 Hypertensive chronic kidney disease with stage 1 through stage 4 chronic kidney disease, or unspecified chronic kidney disease: Secondary | ICD-10-CM | POA: Diagnosis not present

## 2022-05-23 DIAGNOSIS — Z7901 Long term (current) use of anticoagulants: Secondary | ICD-10-CM | POA: Diagnosis not present

## 2022-05-23 DIAGNOSIS — E1122 Type 2 diabetes mellitus with diabetic chronic kidney disease: Secondary | ICD-10-CM | POA: Diagnosis not present

## 2022-05-23 DIAGNOSIS — I480 Paroxysmal atrial fibrillation: Secondary | ICD-10-CM | POA: Diagnosis not present

## 2022-05-23 DIAGNOSIS — Z466 Encounter for fitting and adjustment of urinary device: Secondary | ICD-10-CM | POA: Diagnosis not present

## 2022-05-23 DIAGNOSIS — N189 Chronic kidney disease, unspecified: Secondary | ICD-10-CM | POA: Diagnosis not present

## 2022-05-30 DIAGNOSIS — I480 Paroxysmal atrial fibrillation: Secondary | ICD-10-CM | POA: Diagnosis not present

## 2022-05-30 DIAGNOSIS — N189 Chronic kidney disease, unspecified: Secondary | ICD-10-CM | POA: Diagnosis not present

## 2022-05-30 DIAGNOSIS — Z466 Encounter for fitting and adjustment of urinary device: Secondary | ICD-10-CM | POA: Diagnosis not present

## 2022-05-30 DIAGNOSIS — E1122 Type 2 diabetes mellitus with diabetic chronic kidney disease: Secondary | ICD-10-CM | POA: Diagnosis not present

## 2022-05-30 DIAGNOSIS — I129 Hypertensive chronic kidney disease with stage 1 through stage 4 chronic kidney disease, or unspecified chronic kidney disease: Secondary | ICD-10-CM | POA: Diagnosis not present

## 2022-05-30 DIAGNOSIS — Z7901 Long term (current) use of anticoagulants: Secondary | ICD-10-CM | POA: Diagnosis not present

## 2022-06-07 DIAGNOSIS — E1122 Type 2 diabetes mellitus with diabetic chronic kidney disease: Secondary | ICD-10-CM | POA: Diagnosis not present

## 2022-06-07 DIAGNOSIS — Z7901 Long term (current) use of anticoagulants: Secondary | ICD-10-CM | POA: Diagnosis not present

## 2022-06-07 DIAGNOSIS — Z466 Encounter for fitting and adjustment of urinary device: Secondary | ICD-10-CM | POA: Diagnosis not present

## 2022-06-07 DIAGNOSIS — N189 Chronic kidney disease, unspecified: Secondary | ICD-10-CM | POA: Diagnosis not present

## 2022-06-07 DIAGNOSIS — I480 Paroxysmal atrial fibrillation: Secondary | ICD-10-CM | POA: Diagnosis not present

## 2022-06-07 DIAGNOSIS — I129 Hypertensive chronic kidney disease with stage 1 through stage 4 chronic kidney disease, or unspecified chronic kidney disease: Secondary | ICD-10-CM | POA: Diagnosis not present

## 2022-06-08 DIAGNOSIS — I10 Essential (primary) hypertension: Secondary | ICD-10-CM | POA: Diagnosis not present

## 2022-06-08 DIAGNOSIS — E1122 Type 2 diabetes mellitus with diabetic chronic kidney disease: Secondary | ICD-10-CM | POA: Diagnosis not present

## 2022-06-08 DIAGNOSIS — E039 Hypothyroidism, unspecified: Secondary | ICD-10-CM | POA: Diagnosis not present

## 2022-06-08 DIAGNOSIS — E7800 Pure hypercholesterolemia, unspecified: Secondary | ICD-10-CM | POA: Diagnosis not present

## 2022-06-08 DIAGNOSIS — N184 Chronic kidney disease, stage 4 (severe): Secondary | ICD-10-CM | POA: Diagnosis not present

## 2022-06-10 DIAGNOSIS — E039 Hypothyroidism, unspecified: Secondary | ICD-10-CM | POA: Diagnosis not present

## 2022-06-10 DIAGNOSIS — K219 Gastro-esophageal reflux disease without esophagitis: Secondary | ICD-10-CM | POA: Diagnosis not present

## 2022-06-10 DIAGNOSIS — E1122 Type 2 diabetes mellitus with diabetic chronic kidney disease: Secondary | ICD-10-CM | POA: Diagnosis not present

## 2022-06-10 DIAGNOSIS — Z85828 Personal history of other malignant neoplasm of skin: Secondary | ICD-10-CM | POA: Diagnosis not present

## 2022-06-10 DIAGNOSIS — Z7985 Long-term (current) use of injectable non-insulin antidiabetic drugs: Secondary | ICD-10-CM | POA: Diagnosis not present

## 2022-06-10 DIAGNOSIS — N189 Chronic kidney disease, unspecified: Secondary | ICD-10-CM | POA: Diagnosis not present

## 2022-06-10 DIAGNOSIS — Z8673 Personal history of transient ischemic attack (TIA), and cerebral infarction without residual deficits: Secondary | ICD-10-CM | POA: Diagnosis not present

## 2022-06-10 DIAGNOSIS — I480 Paroxysmal atrial fibrillation: Secondary | ICD-10-CM | POA: Diagnosis not present

## 2022-06-10 DIAGNOSIS — Z7901 Long term (current) use of anticoagulants: Secondary | ICD-10-CM | POA: Diagnosis not present

## 2022-06-10 DIAGNOSIS — Z466 Encounter for fitting and adjustment of urinary device: Secondary | ICD-10-CM | POA: Diagnosis not present

## 2022-06-10 DIAGNOSIS — Z5181 Encounter for therapeutic drug level monitoring: Secondary | ICD-10-CM | POA: Diagnosis not present

## 2022-06-10 DIAGNOSIS — E785 Hyperlipidemia, unspecified: Secondary | ICD-10-CM | POA: Diagnosis not present

## 2022-06-10 DIAGNOSIS — Z7984 Long term (current) use of oral hypoglycemic drugs: Secondary | ICD-10-CM | POA: Diagnosis not present

## 2022-06-10 DIAGNOSIS — I129 Hypertensive chronic kidney disease with stage 1 through stage 4 chronic kidney disease, or unspecified chronic kidney disease: Secondary | ICD-10-CM | POA: Diagnosis not present

## 2022-06-13 DIAGNOSIS — Z466 Encounter for fitting and adjustment of urinary device: Secondary | ICD-10-CM | POA: Diagnosis not present

## 2022-06-13 DIAGNOSIS — E1122 Type 2 diabetes mellitus with diabetic chronic kidney disease: Secondary | ICD-10-CM | POA: Diagnosis not present

## 2022-06-13 DIAGNOSIS — I480 Paroxysmal atrial fibrillation: Secondary | ICD-10-CM | POA: Diagnosis not present

## 2022-06-13 DIAGNOSIS — Z7901 Long term (current) use of anticoagulants: Secondary | ICD-10-CM | POA: Diagnosis not present

## 2022-06-13 DIAGNOSIS — I129 Hypertensive chronic kidney disease with stage 1 through stage 4 chronic kidney disease, or unspecified chronic kidney disease: Secondary | ICD-10-CM | POA: Diagnosis not present

## 2022-06-13 DIAGNOSIS — N189 Chronic kidney disease, unspecified: Secondary | ICD-10-CM | POA: Diagnosis not present

## 2022-06-20 ENCOUNTER — Ambulatory Visit (INDEPENDENT_AMBULATORY_CARE_PROVIDER_SITE_OTHER): Payer: Medicare Other

## 2022-06-20 DIAGNOSIS — I495 Sick sinus syndrome: Secondary | ICD-10-CM | POA: Diagnosis not present

## 2022-06-20 LAB — CUP PACEART REMOTE DEVICE CHECK
Battery Remaining Longevity: 56 mo
Battery Voltage: 2.96 V
Brady Statistic AP VP Percent: 99.96 %
Brady Statistic AP VS Percent: 0 %
Brady Statistic AS VP Percent: 0 %
Brady Statistic AS VS Percent: 0.03 %
Brady Statistic RA Percent Paced: 100 %
Brady Statistic RV Percent Paced: 99.97 %
Date Time Interrogation Session: 20240423040353
Implantable Lead Connection Status: 753985
Implantable Lead Connection Status: 753985
Implantable Lead Implant Date: 19990907
Implantable Lead Implant Date: 19990907
Implantable Lead Location: 753859
Implantable Lead Location: 753860
Implantable Lead Serial Number: 32904
Implantable Lead Serial Number: 33421
Implantable Pulse Generator Implant Date: 20211014
Lead Channel Impedance Value: 228 Ohm
Lead Channel Impedance Value: 228 Ohm
Lead Channel Impedance Value: 228 Ohm
Lead Channel Impedance Value: 247 Ohm
Lead Channel Pacing Threshold Amplitude: 1 V
Lead Channel Pacing Threshold Pulse Width: 0.4 ms
Lead Channel Sensing Intrinsic Amplitude: 12 mV
Lead Channel Sensing Intrinsic Amplitude: 12 mV
Lead Channel Setting Pacing Amplitude: 2 V
Lead Channel Setting Pacing Amplitude: 2.5 V
Lead Channel Setting Pacing Pulse Width: 0.4 ms
Lead Channel Setting Sensing Sensitivity: 8 mV
Zone Setting Status: 755011
Zone Setting Status: 755011

## 2022-06-27 DIAGNOSIS — Z466 Encounter for fitting and adjustment of urinary device: Secondary | ICD-10-CM | POA: Diagnosis not present

## 2022-06-27 DIAGNOSIS — N189 Chronic kidney disease, unspecified: Secondary | ICD-10-CM | POA: Diagnosis not present

## 2022-06-27 DIAGNOSIS — Z7901 Long term (current) use of anticoagulants: Secondary | ICD-10-CM | POA: Diagnosis not present

## 2022-06-27 DIAGNOSIS — I129 Hypertensive chronic kidney disease with stage 1 through stage 4 chronic kidney disease, or unspecified chronic kidney disease: Secondary | ICD-10-CM | POA: Diagnosis not present

## 2022-06-27 DIAGNOSIS — E1122 Type 2 diabetes mellitus with diabetic chronic kidney disease: Secondary | ICD-10-CM | POA: Diagnosis not present

## 2022-06-27 DIAGNOSIS — I480 Paroxysmal atrial fibrillation: Secondary | ICD-10-CM | POA: Diagnosis not present

## 2022-07-07 DIAGNOSIS — N189 Chronic kidney disease, unspecified: Secondary | ICD-10-CM | POA: Diagnosis not present

## 2022-07-07 DIAGNOSIS — E1122 Type 2 diabetes mellitus with diabetic chronic kidney disease: Secondary | ICD-10-CM | POA: Diagnosis not present

## 2022-07-07 DIAGNOSIS — E7800 Pure hypercholesterolemia, unspecified: Secondary | ICD-10-CM | POA: Diagnosis not present

## 2022-07-07 DIAGNOSIS — I480 Paroxysmal atrial fibrillation: Secondary | ICD-10-CM | POA: Diagnosis not present

## 2022-07-07 DIAGNOSIS — Z466 Encounter for fitting and adjustment of urinary device: Secondary | ICD-10-CM | POA: Diagnosis not present

## 2022-07-07 DIAGNOSIS — I10 Essential (primary) hypertension: Secondary | ICD-10-CM | POA: Diagnosis not present

## 2022-07-07 DIAGNOSIS — Z7901 Long term (current) use of anticoagulants: Secondary | ICD-10-CM | POA: Diagnosis not present

## 2022-07-07 DIAGNOSIS — I129 Hypertensive chronic kidney disease with stage 1 through stage 4 chronic kidney disease, or unspecified chronic kidney disease: Secondary | ICD-10-CM | POA: Diagnosis not present

## 2022-07-10 DIAGNOSIS — Z466 Encounter for fitting and adjustment of urinary device: Secondary | ICD-10-CM | POA: Diagnosis not present

## 2022-07-10 DIAGNOSIS — Z5181 Encounter for therapeutic drug level monitoring: Secondary | ICD-10-CM | POA: Diagnosis not present

## 2022-07-10 DIAGNOSIS — E785 Hyperlipidemia, unspecified: Secondary | ICD-10-CM | POA: Diagnosis not present

## 2022-07-10 DIAGNOSIS — Z8673 Personal history of transient ischemic attack (TIA), and cerebral infarction without residual deficits: Secondary | ICD-10-CM | POA: Diagnosis not present

## 2022-07-10 DIAGNOSIS — Z7984 Long term (current) use of oral hypoglycemic drugs: Secondary | ICD-10-CM | POA: Diagnosis not present

## 2022-07-10 DIAGNOSIS — K219 Gastro-esophageal reflux disease without esophagitis: Secondary | ICD-10-CM | POA: Diagnosis not present

## 2022-07-10 DIAGNOSIS — Z85828 Personal history of other malignant neoplasm of skin: Secondary | ICD-10-CM | POA: Diagnosis not present

## 2022-07-10 DIAGNOSIS — Z7901 Long term (current) use of anticoagulants: Secondary | ICD-10-CM | POA: Diagnosis not present

## 2022-07-10 DIAGNOSIS — Z7985 Long-term (current) use of injectable non-insulin antidiabetic drugs: Secondary | ICD-10-CM | POA: Diagnosis not present

## 2022-07-10 DIAGNOSIS — I480 Paroxysmal atrial fibrillation: Secondary | ICD-10-CM | POA: Diagnosis not present

## 2022-07-10 DIAGNOSIS — E1122 Type 2 diabetes mellitus with diabetic chronic kidney disease: Secondary | ICD-10-CM | POA: Diagnosis not present

## 2022-07-10 DIAGNOSIS — N189 Chronic kidney disease, unspecified: Secondary | ICD-10-CM | POA: Diagnosis not present

## 2022-07-10 DIAGNOSIS — E039 Hypothyroidism, unspecified: Secondary | ICD-10-CM | POA: Diagnosis not present

## 2022-07-10 DIAGNOSIS — I129 Hypertensive chronic kidney disease with stage 1 through stage 4 chronic kidney disease, or unspecified chronic kidney disease: Secondary | ICD-10-CM | POA: Diagnosis not present

## 2022-07-12 DIAGNOSIS — I129 Hypertensive chronic kidney disease with stage 1 through stage 4 chronic kidney disease, or unspecified chronic kidney disease: Secondary | ICD-10-CM | POA: Diagnosis not present

## 2022-07-12 DIAGNOSIS — Z466 Encounter for fitting and adjustment of urinary device: Secondary | ICD-10-CM | POA: Diagnosis not present

## 2022-07-12 DIAGNOSIS — N189 Chronic kidney disease, unspecified: Secondary | ICD-10-CM | POA: Diagnosis not present

## 2022-07-12 DIAGNOSIS — E1122 Type 2 diabetes mellitus with diabetic chronic kidney disease: Secondary | ICD-10-CM | POA: Diagnosis not present

## 2022-07-12 DIAGNOSIS — Z7901 Long term (current) use of anticoagulants: Secondary | ICD-10-CM | POA: Diagnosis not present

## 2022-07-12 DIAGNOSIS — I480 Paroxysmal atrial fibrillation: Secondary | ICD-10-CM | POA: Diagnosis not present

## 2022-07-18 NOTE — Progress Notes (Signed)
Remote pacemaker transmission.   

## 2022-07-27 DIAGNOSIS — I129 Hypertensive chronic kidney disease with stage 1 through stage 4 chronic kidney disease, or unspecified chronic kidney disease: Secondary | ICD-10-CM | POA: Diagnosis not present

## 2022-07-27 DIAGNOSIS — E1122 Type 2 diabetes mellitus with diabetic chronic kidney disease: Secondary | ICD-10-CM | POA: Diagnosis not present

## 2022-07-27 DIAGNOSIS — N189 Chronic kidney disease, unspecified: Secondary | ICD-10-CM | POA: Diagnosis not present

## 2022-07-27 DIAGNOSIS — I480 Paroxysmal atrial fibrillation: Secondary | ICD-10-CM | POA: Diagnosis not present

## 2022-07-27 DIAGNOSIS — Z7901 Long term (current) use of anticoagulants: Secondary | ICD-10-CM | POA: Diagnosis not present

## 2022-07-27 DIAGNOSIS — Z466 Encounter for fitting and adjustment of urinary device: Secondary | ICD-10-CM | POA: Diagnosis not present

## 2022-08-08 ENCOUNTER — Encounter (HOSPITAL_COMMUNITY): Payer: Self-pay | Admitting: Emergency Medicine

## 2022-08-08 ENCOUNTER — Inpatient Hospital Stay (HOSPITAL_COMMUNITY)
Admission: EM | Admit: 2022-08-08 | Discharge: 2022-08-12 | DRG: 291 | Disposition: A | Discharge status: Home Health | Payer: Medicare Other | Attending: Internal Medicine | Admitting: Internal Medicine

## 2022-08-08 ENCOUNTER — Other Ambulatory Visit: Payer: Self-pay

## 2022-08-08 DIAGNOSIS — E039 Hypothyroidism, unspecified: Secondary | ICD-10-CM | POA: Diagnosis present

## 2022-08-08 DIAGNOSIS — Z79890 Hormone replacement therapy (postmenopausal): Secondary | ICD-10-CM

## 2022-08-08 DIAGNOSIS — Z8673 Personal history of transient ischemic attack (TIA), and cerebral infarction without residual deficits: Secondary | ICD-10-CM

## 2022-08-08 DIAGNOSIS — R739 Hyperglycemia, unspecified: Secondary | ICD-10-CM | POA: Diagnosis not present

## 2022-08-08 DIAGNOSIS — I5031 Acute diastolic (congestive) heart failure: Secondary | ICD-10-CM | POA: Diagnosis not present

## 2022-08-08 DIAGNOSIS — N189 Chronic kidney disease, unspecified: Secondary | ICD-10-CM | POA: Diagnosis not present

## 2022-08-08 DIAGNOSIS — J189 Pneumonia, unspecified organism: Secondary | ICD-10-CM | POA: Diagnosis not present

## 2022-08-08 DIAGNOSIS — E872 Acidosis, unspecified: Secondary | ICD-10-CM | POA: Diagnosis present

## 2022-08-08 DIAGNOSIS — I480 Paroxysmal atrial fibrillation: Secondary | ICD-10-CM | POA: Diagnosis not present

## 2022-08-08 DIAGNOSIS — I2489 Other forms of acute ischemic heart disease: Secondary | ICD-10-CM | POA: Diagnosis present

## 2022-08-08 DIAGNOSIS — I129 Hypertensive chronic kidney disease with stage 1 through stage 4 chronic kidney disease, or unspecified chronic kidney disease: Secondary | ICD-10-CM | POA: Diagnosis not present

## 2022-08-08 DIAGNOSIS — J90 Pleural effusion, not elsewhere classified: Secondary | ICD-10-CM | POA: Diagnosis not present

## 2022-08-08 DIAGNOSIS — Z1152 Encounter for screening for COVID-19: Secondary | ICD-10-CM

## 2022-08-08 DIAGNOSIS — L89151 Pressure ulcer of sacral region, stage 1: Secondary | ICD-10-CM | POA: Diagnosis present

## 2022-08-08 DIAGNOSIS — Z950 Presence of cardiac pacemaker: Secondary | ICD-10-CM | POA: Diagnosis not present

## 2022-08-08 DIAGNOSIS — K219 Gastro-esophageal reflux disease without esophagitis: Secondary | ICD-10-CM | POA: Diagnosis present

## 2022-08-08 DIAGNOSIS — E119 Type 2 diabetes mellitus without complications: Secondary | ICD-10-CM

## 2022-08-08 DIAGNOSIS — I272 Pulmonary hypertension, unspecified: Secondary | ICD-10-CM | POA: Diagnosis present

## 2022-08-08 DIAGNOSIS — R778 Other specified abnormalities of plasma proteins: Secondary | ICD-10-CM | POA: Diagnosis not present

## 2022-08-08 DIAGNOSIS — R059 Cough, unspecified: Secondary | ICD-10-CM | POA: Diagnosis not present

## 2022-08-08 DIAGNOSIS — Z91018 Allergy to other foods: Secondary | ICD-10-CM

## 2022-08-08 DIAGNOSIS — I130 Hypertensive heart and chronic kidney disease with heart failure and stage 1 through stage 4 chronic kidney disease, or unspecified chronic kidney disease: Principal | ICD-10-CM | POA: Diagnosis present

## 2022-08-08 DIAGNOSIS — R062 Wheezing: Secondary | ICD-10-CM | POA: Diagnosis not present

## 2022-08-08 DIAGNOSIS — D649 Anemia, unspecified: Secondary | ICD-10-CM | POA: Diagnosis present

## 2022-08-08 DIAGNOSIS — J9601 Acute respiratory failure with hypoxia: Secondary | ICD-10-CM | POA: Diagnosis present

## 2022-08-08 DIAGNOSIS — E1165 Type 2 diabetes mellitus with hyperglycemia: Secondary | ICD-10-CM | POA: Diagnosis not present

## 2022-08-08 DIAGNOSIS — Z66 Do not resuscitate: Secondary | ICD-10-CM | POA: Diagnosis present

## 2022-08-08 DIAGNOSIS — E785 Hyperlipidemia, unspecified: Secondary | ICD-10-CM | POA: Diagnosis present

## 2022-08-08 DIAGNOSIS — I10 Essential (primary) hypertension: Secondary | ICD-10-CM | POA: Diagnosis not present

## 2022-08-08 DIAGNOSIS — N184 Chronic kidney disease, stage 4 (severe): Secondary | ICD-10-CM | POA: Diagnosis present

## 2022-08-08 DIAGNOSIS — D6832 Hemorrhagic disorder due to extrinsic circulating anticoagulants: Secondary | ICD-10-CM | POA: Diagnosis present

## 2022-08-08 DIAGNOSIS — I509 Heart failure, unspecified: Principal | ICD-10-CM

## 2022-08-08 DIAGNOSIS — L8990 Pressure ulcer of unspecified site, unspecified stage: Secondary | ICD-10-CM | POA: Insufficient documentation

## 2022-08-08 DIAGNOSIS — Z79899 Other long term (current) drug therapy: Secondary | ICD-10-CM

## 2022-08-08 DIAGNOSIS — I4891 Unspecified atrial fibrillation: Secondary | ICD-10-CM | POA: Diagnosis present

## 2022-08-08 DIAGNOSIS — Z466 Encounter for fitting and adjustment of urinary device: Secondary | ICD-10-CM | POA: Diagnosis not present

## 2022-08-08 DIAGNOSIS — T45515A Adverse effect of anticoagulants, initial encounter: Secondary | ICD-10-CM | POA: Diagnosis present

## 2022-08-08 DIAGNOSIS — R791 Abnormal coagulation profile: Secondary | ICD-10-CM | POA: Diagnosis present

## 2022-08-08 DIAGNOSIS — Z90710 Acquired absence of both cervix and uterus: Secondary | ICD-10-CM

## 2022-08-08 DIAGNOSIS — I110 Hypertensive heart disease with heart failure: Secondary | ICD-10-CM | POA: Diagnosis not present

## 2022-08-08 DIAGNOSIS — I5033 Acute on chronic diastolic (congestive) heart failure: Secondary | ICD-10-CM | POA: Diagnosis present

## 2022-08-08 DIAGNOSIS — Z7985 Long-term (current) use of injectable non-insulin antidiabetic drugs: Secondary | ICD-10-CM | POA: Diagnosis not present

## 2022-08-08 DIAGNOSIS — R051 Acute cough: Secondary | ICD-10-CM | POA: Diagnosis not present

## 2022-08-08 DIAGNOSIS — Z5181 Encounter for therapeutic drug level monitoring: Secondary | ICD-10-CM | POA: Diagnosis not present

## 2022-08-08 DIAGNOSIS — R001 Bradycardia, unspecified: Secondary | ICD-10-CM | POA: Diagnosis not present

## 2022-08-08 DIAGNOSIS — B372 Candidiasis of skin and nail: Secondary | ICD-10-CM | POA: Diagnosis not present

## 2022-08-08 DIAGNOSIS — J811 Chronic pulmonary edema: Secondary | ICD-10-CM | POA: Diagnosis not present

## 2022-08-08 DIAGNOSIS — Z7901 Long term (current) use of anticoagulants: Secondary | ICD-10-CM | POA: Diagnosis not present

## 2022-08-08 DIAGNOSIS — Z9049 Acquired absence of other specified parts of digestive tract: Secondary | ICD-10-CM

## 2022-08-08 DIAGNOSIS — Z888 Allergy status to other drugs, medicaments and biological substances status: Secondary | ICD-10-CM

## 2022-08-08 DIAGNOSIS — Z7984 Long term (current) use of oral hypoglycemic drugs: Secondary | ICD-10-CM | POA: Diagnosis not present

## 2022-08-08 DIAGNOSIS — R7989 Other specified abnormal findings of blood chemistry: Secondary | ICD-10-CM

## 2022-08-08 DIAGNOSIS — E1122 Type 2 diabetes mellitus with diabetic chronic kidney disease: Secondary | ICD-10-CM | POA: Diagnosis not present

## 2022-08-08 NOTE — ED Triage Notes (Signed)
  Patient BIB EMS for hyperglycemia and wheezing.  Patient was seen earlier at [facility] for cough/SOB.  CBG per EMS was 325.  SPO2 94% on RA.  Expiratory wheezing heard in lower lobes.  Patient given duo neb and solumedrol by EMS.  No pain at this time.

## 2022-08-09 ENCOUNTER — Inpatient Hospital Stay (HOSPITAL_COMMUNITY): Payer: Medicare Other

## 2022-08-09 ENCOUNTER — Encounter (HOSPITAL_COMMUNITY): Payer: Self-pay | Admitting: Internal Medicine

## 2022-08-09 ENCOUNTER — Emergency Department (HOSPITAL_COMMUNITY): Payer: Medicare Other

## 2022-08-09 DIAGNOSIS — E872 Acidosis, unspecified: Secondary | ICD-10-CM | POA: Diagnosis present

## 2022-08-09 DIAGNOSIS — I5033 Acute on chronic diastolic (congestive) heart failure: Secondary | ICD-10-CM | POA: Diagnosis present

## 2022-08-09 DIAGNOSIS — E1122 Type 2 diabetes mellitus with diabetic chronic kidney disease: Secondary | ICD-10-CM | POA: Diagnosis present

## 2022-08-09 DIAGNOSIS — Z950 Presence of cardiac pacemaker: Secondary | ICD-10-CM | POA: Diagnosis not present

## 2022-08-09 DIAGNOSIS — E1165 Type 2 diabetes mellitus with hyperglycemia: Secondary | ICD-10-CM | POA: Diagnosis present

## 2022-08-09 DIAGNOSIS — R791 Abnormal coagulation profile: Secondary | ICD-10-CM | POA: Diagnosis present

## 2022-08-09 DIAGNOSIS — Z79890 Hormone replacement therapy (postmenopausal): Secondary | ICD-10-CM | POA: Diagnosis not present

## 2022-08-09 DIAGNOSIS — I509 Heart failure, unspecified: Secondary | ICD-10-CM

## 2022-08-09 DIAGNOSIS — N184 Chronic kidney disease, stage 4 (severe): Secondary | ICD-10-CM | POA: Diagnosis present

## 2022-08-09 DIAGNOSIS — Z7901 Long term (current) use of anticoagulants: Secondary | ICD-10-CM | POA: Diagnosis not present

## 2022-08-09 DIAGNOSIS — I130 Hypertensive heart and chronic kidney disease with heart failure and stage 1 through stage 4 chronic kidney disease, or unspecified chronic kidney disease: Secondary | ICD-10-CM | POA: Diagnosis present

## 2022-08-09 DIAGNOSIS — E039 Hypothyroidism, unspecified: Secondary | ICD-10-CM | POA: Diagnosis present

## 2022-08-09 DIAGNOSIS — I480 Paroxysmal atrial fibrillation: Secondary | ICD-10-CM | POA: Diagnosis present

## 2022-08-09 DIAGNOSIS — Z1152 Encounter for screening for COVID-19: Secondary | ICD-10-CM | POA: Diagnosis not present

## 2022-08-09 DIAGNOSIS — Z7985 Long-term (current) use of injectable non-insulin antidiabetic drugs: Secondary | ICD-10-CM | POA: Diagnosis not present

## 2022-08-09 DIAGNOSIS — I129 Hypertensive chronic kidney disease with stage 1 through stage 4 chronic kidney disease, or unspecified chronic kidney disease: Secondary | ICD-10-CM | POA: Diagnosis not present

## 2022-08-09 DIAGNOSIS — Z466 Encounter for fitting and adjustment of urinary device: Secondary | ICD-10-CM | POA: Diagnosis not present

## 2022-08-09 DIAGNOSIS — L89151 Pressure ulcer of sacral region, stage 1: Secondary | ICD-10-CM | POA: Diagnosis present

## 2022-08-09 DIAGNOSIS — D649 Anemia, unspecified: Secondary | ICD-10-CM | POA: Diagnosis present

## 2022-08-09 DIAGNOSIS — J811 Chronic pulmonary edema: Secondary | ICD-10-CM | POA: Diagnosis not present

## 2022-08-09 DIAGNOSIS — J9601 Acute respiratory failure with hypoxia: Secondary | ICD-10-CM | POA: Diagnosis present

## 2022-08-09 DIAGNOSIS — I272 Pulmonary hypertension, unspecified: Secondary | ICD-10-CM | POA: Diagnosis present

## 2022-08-09 DIAGNOSIS — J90 Pleural effusion, not elsewhere classified: Secondary | ICD-10-CM | POA: Diagnosis not present

## 2022-08-09 DIAGNOSIS — I5031 Acute diastolic (congestive) heart failure: Secondary | ICD-10-CM | POA: Insufficient documentation

## 2022-08-09 DIAGNOSIS — N189 Chronic kidney disease, unspecified: Secondary | ICD-10-CM | POA: Diagnosis not present

## 2022-08-09 DIAGNOSIS — Z66 Do not resuscitate: Secondary | ICD-10-CM | POA: Diagnosis present

## 2022-08-09 DIAGNOSIS — Z7984 Long term (current) use of oral hypoglycemic drugs: Secondary | ICD-10-CM | POA: Diagnosis not present

## 2022-08-09 DIAGNOSIS — I2489 Other forms of acute ischemic heart disease: Secondary | ICD-10-CM | POA: Diagnosis present

## 2022-08-09 DIAGNOSIS — Z8673 Personal history of transient ischemic attack (TIA), and cerebral infarction without residual deficits: Secondary | ICD-10-CM | POA: Diagnosis not present

## 2022-08-09 DIAGNOSIS — Z5181 Encounter for therapeutic drug level monitoring: Secondary | ICD-10-CM | POA: Diagnosis not present

## 2022-08-09 DIAGNOSIS — R059 Cough, unspecified: Secondary | ICD-10-CM | POA: Diagnosis not present

## 2022-08-09 DIAGNOSIS — K219 Gastro-esophageal reflux disease without esophagitis: Secondary | ICD-10-CM | POA: Diagnosis present

## 2022-08-09 DIAGNOSIS — T45515A Adverse effect of anticoagulants, initial encounter: Secondary | ICD-10-CM | POA: Diagnosis present

## 2022-08-09 DIAGNOSIS — E785 Hyperlipidemia, unspecified: Secondary | ICD-10-CM | POA: Diagnosis present

## 2022-08-09 LAB — CBG MONITORING, ED
Glucose-Capillary: 287 mg/dL — ABNORMAL HIGH (ref 70–99)
Glucose-Capillary: 357 mg/dL — ABNORMAL HIGH (ref 70–99)
Glucose-Capillary: 382 mg/dL — ABNORMAL HIGH (ref 70–99)

## 2022-08-09 LAB — BASIC METABOLIC PANEL
Anion gap: 10 (ref 5–15)
Anion gap: 13 (ref 5–15)
BUN: 57 mg/dL — ABNORMAL HIGH (ref 8–23)
BUN: 58 mg/dL — ABNORMAL HIGH (ref 8–23)
CO2: 17 mmol/L — ABNORMAL LOW (ref 22–32)
CO2: 19 mmol/L — ABNORMAL LOW (ref 22–32)
Calcium: 8.2 mg/dL — ABNORMAL LOW (ref 8.9–10.3)
Calcium: 8.3 mg/dL — ABNORMAL LOW (ref 8.9–10.3)
Chloride: 104 mmol/L (ref 98–111)
Chloride: 107 mmol/L (ref 98–111)
Creatinine, Ser: 2.16 mg/dL — ABNORMAL HIGH (ref 0.44–1.00)
Creatinine, Ser: 2.24 mg/dL — ABNORMAL HIGH (ref 0.44–1.00)
GFR, Estimated: 21 mL/min — ABNORMAL LOW (ref >60)
GFR, Estimated: 22 mL/min — ABNORMAL LOW (ref >60)
Glucose, Bld: 291 mg/dL — ABNORMAL HIGH (ref 70–99)
Glucose, Bld: 343 mg/dL — ABNORMAL HIGH (ref 70–99)
Potassium: 4.2 mmol/L (ref 3.5–5.1)
Potassium: 4.5 mmol/L (ref 3.5–5.1)
Sodium: 134 mmol/L — ABNORMAL LOW (ref 135–145)
Sodium: 136 mmol/L (ref 135–145)

## 2022-08-09 LAB — CBC
HCT: 27.2 % — ABNORMAL LOW (ref 36.0–46.0)
Hemoglobin: 8.4 g/dL — ABNORMAL LOW (ref 12.0–15.0)
MCH: 28.6 pg (ref 26.0–34.0)
MCHC: 30.9 g/dL (ref 30.0–36.0)
MCV: 92.5 fL (ref 80.0–100.0)
Platelets: 124 10*3/uL — ABNORMAL LOW (ref 150–400)
RBC: 2.94 MIL/uL — ABNORMAL LOW (ref 3.87–5.11)
RDW: 17.3 % — ABNORMAL HIGH (ref 11.5–15.5)
WBC: 5.7 10*3/uL (ref 4.0–10.5)
nRBC: 0 % (ref 0.0–0.2)

## 2022-08-09 LAB — CBC WITH DIFFERENTIAL/PLATELET
Abs Immature Granulocytes: 0.1 10*3/uL — ABNORMAL HIGH (ref 0.00–0.07)
Basophils Absolute: 0 10*3/uL (ref 0.0–0.1)
Basophils Relative: 0 %
Eosinophils Absolute: 0.1 10*3/uL (ref 0.0–0.5)
Eosinophils Relative: 1 %
HCT: 28 % — ABNORMAL LOW (ref 36.0–46.0)
Hemoglobin: 8.7 g/dL — ABNORMAL LOW (ref 12.0–15.0)
Immature Granulocytes: 1 %
Lymphocytes Relative: 19 %
Lymphs Abs: 1.4 10*3/uL (ref 0.7–4.0)
MCH: 28.6 pg (ref 26.0–34.0)
MCHC: 31.1 g/dL (ref 30.0–36.0)
MCV: 92.1 fL (ref 80.0–100.0)
Monocytes Absolute: 0.2 10*3/uL (ref 0.1–1.0)
Monocytes Relative: 3 %
Neutro Abs: 5.6 10*3/uL (ref 1.7–7.7)
Neutrophils Relative %: 76 %
Platelets: 132 10*3/uL — ABNORMAL LOW (ref 150–400)
RBC: 3.04 MIL/uL — ABNORMAL LOW (ref 3.87–5.11)
RDW: 17.2 % — ABNORMAL HIGH (ref 11.5–15.5)
WBC: 7.4 10*3/uL (ref 4.0–10.5)
nRBC: 0 % (ref 0.0–0.2)

## 2022-08-09 LAB — HEMOGLOBIN A1C
Hgb A1c MFr Bld: 6 % — ABNORMAL HIGH (ref 4.8–5.6)
Mean Plasma Glucose: 125.5 mg/dL

## 2022-08-09 LAB — ECHOCARDIOGRAM COMPLETE
AR max vel: 1 cm2
AV Area VTI: 1 cm2
AV Area mean vel: 1 cm2
AV Mean grad: 26 mmHg
AV Peak grad: 45.8 mmHg
Ao pk vel: 3.39 m/s
Area-P 1/2: 6.96 cm2
Height: 62.5 in
MV M vel: 5.48 m/s
MV Peak grad: 120.1 mmHg
MV VTI: 2.2 cm2
P 1/2 time: 350 msec
S' Lateral: 2.8 cm
Weight: 2400 oz

## 2022-08-09 LAB — RESP PANEL BY RT-PCR (RSV, FLU A&B, COVID)  RVPGX2
Influenza A by PCR: NEGATIVE
Influenza B by PCR: NEGATIVE
Resp Syncytial Virus by PCR: NEGATIVE
SARS Coronavirus 2 by RT PCR: NEGATIVE

## 2022-08-09 LAB — PROTIME-INR
INR: 2.3 — ABNORMAL HIGH (ref 0.8–1.2)
Prothrombin Time: 25.4 seconds — ABNORMAL HIGH (ref 11.4–15.2)

## 2022-08-09 LAB — GLUCOSE, CAPILLARY
Glucose-Capillary: 225 mg/dL — ABNORMAL HIGH (ref 70–99)
Glucose-Capillary: 224 mg/dL — ABNORMAL HIGH (ref 70–99)

## 2022-08-09 LAB — MAGNESIUM: Magnesium: 2.1 mg/dL (ref 1.7–2.4)

## 2022-08-09 LAB — TROPONIN I (HIGH SENSITIVITY)
Troponin I (High Sensitivity): 75 ng/L — ABNORMAL HIGH (ref <18)
Troponin I (High Sensitivity): 81 ng/L — ABNORMAL HIGH (ref <18)

## 2022-08-09 LAB — PROCALCITONIN: Procalcitonin: <0.1 ng/mL

## 2022-08-09 LAB — BRAIN NATRIURETIC PEPTIDE: B Natriuretic Peptide: 229.6 pg/mL — ABNORMAL HIGH (ref 0.0–100.0)

## 2022-08-09 LAB — PHOSPHORUS: Phosphorus: 4.4 mg/dL (ref 2.5–4.6)

## 2022-08-09 MED ORDER — FUROSEMIDE 10 MG/ML IJ SOLN
40.0000 mg | Freq: Once | INTRAMUSCULAR | Status: AC
  Administered 2022-08-09: 40 mg via INTRAVENOUS
  Filled 2022-08-09: qty 4

## 2022-08-09 MED ORDER — ROSUVASTATIN CALCIUM 10 MG PO TABS
10.0000 mg | ORAL_TABLET | Freq: Every day | ORAL | Status: DC
  Administered 2022-08-09 – 2022-08-12 (×4): 10 mg via ORAL
  Filled 2022-08-09 (×4): qty 1

## 2022-08-09 MED ORDER — FUROSEMIDE 10 MG/ML IJ SOLN
20.0000 mg | Freq: Once | INTRAMUSCULAR | Status: AC
  Administered 2022-08-09: 20 mg via INTRAVENOUS
  Filled 2022-08-09: qty 4

## 2022-08-09 MED ORDER — FUROSEMIDE 10 MG/ML IJ SOLN
40.0000 mg | Freq: Every day | INTRAMUSCULAR | Status: DC
  Administered 2022-08-09 – 2022-08-11 (×3): 40 mg via INTRAVENOUS
  Filled 2022-08-09 (×3): qty 4

## 2022-08-09 MED ORDER — WARFARIN - PHARMACIST DOSING INPATIENT: Freq: Every day | Status: DC

## 2022-08-09 MED ORDER — ACETAMINOPHEN 325 MG PO TABS
650.0000 mg | ORAL_TABLET | Freq: Four times a day (QID) | ORAL | Status: DC | PRN
  Administered 2022-08-12: 650 mg via ORAL
  Filled 2022-08-09: qty 2

## 2022-08-09 MED ORDER — POLYETHYLENE GLYCOL 3350 17 G PO PACK: 17.0000 g | PACK | Freq: Every day | ORAL | Status: DC | PRN

## 2022-08-09 MED ORDER — PROCHLORPERAZINE EDISYLATE 10 MG/2ML IJ SOLN: 5.0000 mg | Freq: Four times a day (QID) | INTRAMUSCULAR | Status: DC | PRN

## 2022-08-09 MED ORDER — INSULIN ASPART 100 UNIT/ML IJ SOLN
0.0000 [IU] | Freq: Every day | INTRAMUSCULAR | Status: DC
  Administered 2022-08-09 – 2022-08-11 (×2): 2 [IU] via SUBCUTANEOUS
  Filled 2022-08-09: qty 0.05

## 2022-08-09 MED ORDER — INSULIN ASPART 100 UNIT/ML IJ SOLN
0.0000 [IU] | Freq: Three times a day (TID) | INTRAMUSCULAR | Status: DC
  Administered 2022-08-09: 3 [IU] via SUBCUTANEOUS
  Administered 2022-08-09 (×2): 9 [IU] via SUBCUTANEOUS
  Administered 2022-08-10: 2 [IU] via SUBCUTANEOUS
  Administered 2022-08-10: 3 [IU] via SUBCUTANEOUS
  Administered 2022-08-11: 1 [IU] via SUBCUTANEOUS
  Administered 2022-08-11: 3 [IU] via SUBCUTANEOUS
  Administered 2022-08-11 – 2022-08-12 (×2): 1 [IU] via SUBCUTANEOUS
  Administered 2022-08-12: 3 [IU] via SUBCUTANEOUS
  Filled 2022-08-09: qty 0.09

## 2022-08-09 MED ORDER — LEVOTHYROXINE SODIUM 50 MCG PO TABS
50.0000 ug | ORAL_TABLET | Freq: Every day | ORAL | Status: DC
  Administered 2022-08-09 – 2022-08-12 (×4): 50 ug via ORAL
  Filled 2022-08-09 (×4): qty 1

## 2022-08-09 MED ORDER — WARFARIN SODIUM 1 MG PO TABS
1.5000 mg | ORAL_TABLET | Freq: Once | ORAL | Status: AC
  Administered 2022-08-09: 1.5 mg via ORAL
  Filled 2022-08-09: qty 1

## 2022-08-09 NOTE — H&P (Addendum)
History and Physical  Tiffany [name] ZOX:[id number] DOB: 1931-07-22 DOA: 08/08/2022  Referring physician: Dr. Nicanor Alcon, EDP  PCP: Tally Joe, MD  Outpatient Specialists: Cardiology Patient coming from: Home.  Chief Complaint: Shortness of breath.  HPI: Tiffany [name] is a 89 y.o. female with medical history significant for paroxysmal A-fib on Coumadin, HFpEF, hypothyroidism, hyperlipidemia, CKD 4, who presented to [facility] ED from home due to progressive shortness of breath for the past few days.  Associated with a productive cough and audible wheezing.  Denies chest pain.  She presented to the ED via EMS.  In the ED, frail-appearing with audible wheezing.  Chest x-ray showing mild cardiogenic failure with small left pleural effusion.  COVID-19 screening test, influenza A&B by PCR, RSV by PCR negative.  The patient received IV Lasix with improvement of her symptomatology.  TRH, hospitalist service, was asked to admit.  ED Course: Temperature 98.  BP 139/31, pulse 59, respiratory 18, saturation 95% on 2 L.  Lab studies markable for WBC 7.4, hemoglobin 8.7, MCV 92, platelet 132.  Sodium 134, serum bicarb 17, glucose 347, BUN 57, creatinine 2.24, GFR 21, high-sensitivity troponin 75 from 81.  Review of Systems: Review of systems as noted in the HPI. All other systems reviewed and are negative.   Past Medical History:  Diagnosis Date   Chronic kidney disease    Diverticulosis    DM2 (diabetes mellitus, type 2) (HCC)    Esophageal reflux    HTN (hypertension)    Hyperlipidemia    Hypothyroidism    Microalbuminuria    last done in 1/07    Paroxysmal atrial fibrillation (HCC)    and atrial tachycardia   Presence of permanent cardiac pacemaker    Stroke (HCC) 1999   Tachycardia-bradycardia syndrome (HCC)    s/p PPM   Unspecified hypothyroidism 11/20/2013   Past Surgical History:  Procedure Laterality Date   CHOLECYSTECTOMY     COLONOSCOPY N/A 11/21/2013   Procedure:  COLONOSCOPY;  Surgeon: Petra Kuba, MD;  Location: [facility] ENDOSCOPY;  Service: Endoscopy;  Laterality: N/A;   ESOPHAGOGASTRODUODENOSCOPY (EGD) WITH PROPOFOL N/A 05/26/2020   Procedure: ESOPHAGOGASTRODUODENOSCOPY (EGD) WITH PROPOFOL;  Surgeon: Kerin Salen, MD;  Location: [facility] ENDOSCOPY;  Service: Gastroenterology;  Laterality: N/A;   HEMORRHOID SURGERY     hysterectomy (other)     PACEMAKER GENERATOR CHANGE  10/03/12   DMT Advisa DR gen change by Dr Johney Frame   PACEMAKER GENERATOR CHANGE N/A 10/03/2012   Procedure: PACEMAKER GENERATOR CHANGE;  Surgeon: Hillis Range, MD;  Location: [facility] CATH LAB;  Service: Cardiovascular;  Laterality: N/A;   permanent pacemaker  1999   updated 2006 (MDT) by Dr Reyes Ivan; gen change 09-2012 by Dr Johney Frame (MDT)   PPM GENERATOR CHANGEOUT N/A 12/11/2019   Procedure: PPM GENERATOR CHANGEOUT;  Surgeon: Hillis Range, MD;  Location: [facility] INVASIVE CV LAB;  Service: Cardiovascular;  Laterality: N/A;    Social History:  reports that she has never smoked. She has never been exposed to tobacco smoke. She has never used smokeless tobacco. She reports that she does not drink alcohol and does not use drugs.   Allergies  Allergen Reactions   Other Other (See Comments)    Nuts, Corn, popcorn - diverticulitis   Sitagliptin     Other reaction(s): Unknown    Family History  Problem Relation Age of Onset   Tuberculosis Mother    Diabetes Father       Prior to Admission medications   Medication Sig Start Date End  Date Taking? Authorizing Provider  cefdinir (OMNICEF) 300 MG capsule Take 1 capsule (300 mg total) by mouth 2 (two) times daily. 04/13/22   Glyn Ade, MD  diltiazem (TIAZAC) 120 MG 24 hr capsule Take 120 mg by mouth daily.    [provider]  FERGON 240 (27 FE) MG tablet Take 240 mg by mouth in the morning and at bedtime. 10/31/14   [provider]  furosemide (LASIX) 20 MG tablet Take 20 mg by mouth daily.    [provider]  glipiZIDE (GLUCOTROL)  5 MG tablet Take 5 mg by mouth daily before breakfast.     [provider]  levothyroxine (SYNTHROID, LEVOTHROID) 50 MCG tablet Take 50 mcg by mouth daily before breakfast.     [provider]  Multiple Vitamins-Minerals (MULTIVITAMIN WITH MINERALS) tablet Take 1 tablet by mouth daily.    [provider]  OZEMPIC, 1 MG/DOSE, 4 MG/3ML SOPN Inject 1 mg into the skin once a week. 04/29/20   [provider]  potassium chloride SA (K-DUR,KLOR-CON) 20 MEQ tablet Take 20 mEq by mouth daily.    [provider]  rosuvastatin (CRESTOR) 10 MG tablet Take 10 mg by mouth daily.    [provider]  warfarin (COUMADIN) 2 MG tablet Take 2 mg by mouth daily.    [provider]    Physical Exam: BP (!) 152/48   Pulse 72   Temp 98 F (36.7 C) (Oral)   Resp (!) 22   Ht 5' 2.5" (1.588 m)   Wt 68 kg   SpO2 95%   BMI 27.00 kg/m   General: 89 y.o. year-old female well developed well nourished in no acute distress.  Alert and oriented x3. Cardiovascular: Regular rate and rhythm with no rubs or gallops.  No thyromegaly or JVD noted.  Trace lower extremity edema bilaterally Respiratory: Mild rales at bases.  Diffuse wheezing noted.  Poor inspiratory effort. Abdomen: Soft nontender nondistended with normal bowel sounds x4 quadrants. Muskuloskeletal: No cyanosis or clubbing noted bilaterally Neuro: CN II-XII intact, strength, sensation, reflexes Skin: No ulcerative lesions noted or rashes Psychiatry: Judgement and insight appear normal. Mood is appropriate for condition and setting          Labs on Admission:  Basic Metabolic Panel: Recent Labs  Lab 08/09/22 0023 08/09/22 0501  NA 136 134*  K 4.5 4.2  CL 107 104  CO2 19* 17*  GLUCOSE 291* 343*  BUN 58* 57*  CREATININE 2.16* 2.24*  CALCIUM 8.3* 8.2*  MG  --  2.1  PHOS  --  4.4   Liver Function Tests: No results for input(s): "AST", "ALT", "ALKPHOS", "BILITOT", "PROT", "ALBUMIN" in the  last 168 hours. No results for input(s): "LIPASE", "AMYLASE" in the last 168 hours. No results for input(s): "AMMONIA" in the last 168 hours. CBC: Recent Labs  Lab 08/09/22 0023 08/09/22 0501  WBC 7.4 5.7  NEUTROABS 5.6  --   HGB 8.7* 8.4*  HCT 28.0* 27.2*  MCV 92.1 92.5  PLT 132* 124*   Cardiac Enzymes: No results for input(s): "CKTOTAL", "CKMB", "CKMBINDEX", "TROPONINI" in the last 168 hours.  BNP (last 3 results) No results for input(s): "BNP" in the last 8760 hours.  ProBNP (last 3 results) No results for input(s): "PROBNP" in the last 8760 hours.  CBG: Recent Labs  Lab 08/08/22 2357  GLUCAP 287*    Radiological Exams on Admission: DG Chest Portable 1 View  Result Date: 08/09/2022 CLINICAL DATA:  Cough EXAM:  PORTABLE CHEST 1 VIEW COMPARISON:  10/25/2016 FINDINGS: Lungs are clear. No pneumothorax. Small left pleural effusion has developed. Right subclavian dual lead pacemaker is unchanged. Cardiomegaly is stable when accounting for patient positioning. Mild bilateral perihilar interstitial pulmonary infiltrate has developed most in keeping with mild cardiogenic failure. No acute bone abnormality. IMPRESSION: 1. Mild cardiogenic failure with small left pleural effusion. Electronically Signed   By: Helyn Numbers M.D.   On: 08/09/2022 00:20    EKG: I independently viewed the EKG done and my findings are as followed: AV dual paced rhythm rate of 62.  QTc 497.  LBBB.  Assessment/Plan Present on Admission: **None**  Principal Problem:   CHF exacerbation (HCC)  Acute on chronic diastolic CHF Mild pulmonary edema on chest x-ray with left pleural effusion Progressive exertional dyspnea Trace lower extremity edema BNP pending Obtain 2D echo Continue IV diuresing Start strict I's and O's and daily weight  Elevated troponin likely demand ischemia in the setting of acute on chronic diastolic CHF No anginal symptoms reported at the time of this visit Troponin peaked at  81 and downtrended Follow 2D echo  Paroxysmal A-fib on Coumadin INR therapeutic 2.3 Pharmacy consulted to assist with Coumadin dosing  Type 2 diabetes with hyperglycemia Obtain hemoglobin A1c Start insulin sliding scale  CKD 4 Appears to be at her baseline creatinine Closely monitor urine output  Non anion gap metabolic acidosis Serum bicarb 17, anion gap 13 Repeat labs if metabolic acidosis persists consider replacing bicarb with low pH.  Chronic normocytic anemia Hemoglobin 8.4 No reported overt bleeding Continue to monitor H&H  Physical debility PT OT to assess Fall precautions.  Hyperlipidemia Resume home Lipitor  Hypothyroidism Resume home levothyroxine.   DVT prophylaxis: Home Coumadin  Code Status: DNR, form under ACP.  Family Communication: None at bedside  Disposition Plan: Admitted to telemetry unit  Consults called: None.  Admission status: Inpatient status.   Status is: Inpatient The patient requires at least 2 midnights for further evaluation and treatment of present condition.   Darlin Drop MD [facility] Pager [phone]  If 7PM-7AM, please contact night-coverage [email or web address] Password [facility]  08/09/2022, 6:22 AM

## 2022-08-09 NOTE — Progress Notes (Signed)
  PROGRESS NOTE  Patient admitted earlier this morning. See H&P.   Tiffany [name] is a 89 y.o. female with medical history significant for paroxysmal A-fib on Coumadin, HFpEF, hypothyroidism, hyperlipidemia, CKD 4, who presented to [facility] ED from home due to progressive shortness of breath for the past few days.  Associated with a productive cough and audible wheezing.  Denies chest pain.  Chest x-ray revealed mild cardiogenic failure with small left pleural effusion.  Patient was started on IV Lasix and admitted to the hospital.  Patient seen in the emergency department.  States that she is feeling a little bit better since admission.  She is on 2 L nasal cannula O2.  Has a chronic Foley in place.  Acute on chronic diastolic CHF -Presented with shortness of breath, pulmonary edema with effusion -BNP 229.6 -Continue IV Lasix -Echocardiogram pending -Strict I's and O's, daily weight -Heart healthy diet, fluid restriction  Acute hypoxemic respiratory failure -Dropped down to 88% on room air in the emergency department.  Currently on 2 L nasal cannula O2  Demand ischemia -Insetting of CHF exacerbation -Troponin 81, 75 -Echocardiogram pending  Paroxysmal A-fib on Coumadin -Coumadin dosing per pharmacy  Diabetes mellitus -A1c 6.0 -Sliding scale insulin  CKD stage IV -Baseline creatinine 2.0  Hyperlipidemia -Crestor  Hypothyroidism -Synthroid    Status is: Inpatient Remains inpatient appropriate because: IV Lasix   Noralee Stain, DO [facility] 08/09/2022, 1:16 PM  Available via Epic secure chat 7am-7pm After these hours, please refer to coverage provider listed on [email or web address]

## 2022-08-09 NOTE — ED Notes (Signed)
[place] at bedside, unable to take pt upstairs due to them being in the room

## 2022-08-09 NOTE — Progress Notes (Signed)
Echocardiogram 2D Echocardiogram has been performed.  Lucendia Herrlich 08/09/2022, 3:16 PM

## 2022-08-09 NOTE — ED Notes (Signed)
[place] still at bedside preventing pt from going upstairs

## 2022-08-09 NOTE — ED Notes (Signed)
Dr. Alvino Chapel notified of pt DNR form that is at bedside

## 2022-08-09 NOTE — ED Provider Notes (Signed)
[place] EMERGENCY DEPARTMENT AT [facility] Provider Note   CSN: [id number] Arrival date & time: 08/08/22  2347     History  Chief Complaint  Patient presents with   Hyperglycemia   Wheezing    Tiffany [name] is a 89 y.o. female.  The history is provided by the patient.  Shortness of Breath Severity:  Moderate Onset quality:  Gradual Timing:  Constant Progression:  Worsening Chronicity:  New Context: not URI   Relieved by:  Nothing Worsened by:  Nothing Ineffective treatments:  Diuretics Associated symptoms: no chest pain and no fever   Patient with AFIB on coumadin and CKD presents with SOB and cough.      Past Medical History:  Diagnosis Date   Chronic kidney disease    Diverticulosis    DM2 (diabetes mellitus, type 2) (HCC)    Esophageal reflux    HTN (hypertension)    Hyperlipidemia    Hypothyroidism    Microalbuminuria    last done in 1/07    Paroxysmal atrial fibrillation (HCC)    and atrial tachycardia   Presence of permanent cardiac pacemaker    Stroke [facility]) 1999   Tachycardia-bradycardia syndrome (HCC)    s/p PPM   Unspecified hypothyroidism 11/20/2013     Home Medications Prior to Admission medications   Medication Sig Start Date End Date Taking? Authorizing Provider  cefdinir (OMNICEF) 300 MG capsule Take 1 capsule (300 mg total) by mouth 2 (two) times daily. 04/13/22   Glyn Ade, MD  diltiazem (TIAZAC) 120 MG 24 hr capsule Take 120 mg by mouth daily.    [provider]  FERGON 240 (27 FE) MG tablet Take 240 mg by mouth in the morning and at bedtime. 10/31/14   [provider]  furosemide (LASIX) 20 MG tablet Take 20 mg by mouth daily.    [provider]  glipiZIDE (GLUCOTROL) 5 MG tablet Take 5 mg by mouth daily before breakfast.     [provider]  levothyroxine (SYNTHROID, LEVOTHROID) 50 MCG tablet Take 50 mcg by mouth daily before breakfast.     [provider]   Multiple Vitamins-Minerals (MULTIVITAMIN WITH MINERALS) tablet Take 1 tablet by mouth daily.    [provider]  OZEMPIC, 1 MG/DOSE, 4 MG/3ML SOPN Inject 1 mg into the skin once a week. 04/29/20   [provider]  potassium chloride SA (K-DUR,KLOR-CON) 20 MEQ tablet Take 20 mEq by mouth daily.    [provider]  rosuvastatin (CRESTOR) 10 MG tablet Take 10 mg by mouth daily.    [provider]  warfarin (COUMADIN) 2 MG tablet Take 2 mg by mouth daily.    [provider]      Allergies    Other and Sitagliptin    Review of Systems   Review of Systems  Constitutional:  Negative for fever.  HENT:  Negative for facial swelling.   Eyes:  Negative for redness.  Respiratory:  Positive for cough, shortness of breath and wheezing.   Cardiovascular:  Negative for chest pain.  All other systems reviewed and are negative.   Physical Exam Updated Vital Signs BP (!) 134/38   Pulse 60   Temp 98 F (36.7 C) (Oral)   Resp 20   Ht 5' 2.5" (1.588 m)   Wt 68 kg   SpO2 90%   BMI 27.00 kg/m  Physical Exam Vitals and nursing note reviewed.  Constitutional:      General: She is not  in acute distress.    Appearance: Normal appearance. She is well-developed.  HENT:     Head: Normocephalic and atraumatic.     Nose: Nose normal.  Eyes:     Pupils: Pupils are equal, round, and reactive to light.  Cardiovascular:     Rate and Rhythm: Normal rate and regular rhythm.     Pulses: Normal pulses.     Heart sounds: Normal heart sounds.  Pulmonary:     Effort: No respiratory distress.     Breath sounds: Rhonchi and rales present.  Abdominal:     General: Bowel sounds are normal. There is no distension.     Palpations: Abdomen is soft.     Tenderness: There is no abdominal tenderness. There is no guarding or rebound.  Genitourinary:    Vagina: No vaginal discharge.  Musculoskeletal:        General: Normal range of motion.     Cervical back: Normal range  of motion and neck supple.  Skin:    General: Skin is warm and dry.     Capillary Refill: Capillary refill takes less than 2 seconds.     Findings: No erythema or rash.  Neurological:     General: No focal deficit present.     Mental Status: She is alert.     Deep Tendon Reflexes: Reflexes normal.  Psychiatric:        Mood and Affect: Mood normal.     ED Results / Procedures / Treatments   Labs (all labs ordered are listed, but only abnormal results are displayed) Results for orders placed or performed during the hospital encounter of 08/08/22  Resp panel by RT-PCR (RSV, Flu A&B, Covid) Anterior Nasal Swab   Specimen: Anterior Nasal Swab  Result Value Ref Range   SARS Coronavirus 2 by RT PCR NEGATIVE NEGATIVE   Influenza A by PCR NEGATIVE NEGATIVE   Influenza B by PCR NEGATIVE NEGATIVE   Resp Syncytial Virus by PCR NEGATIVE NEGATIVE  CBC with Differential  Result Value Ref Range   WBC 7.4 4.0 - 10.5 K/uL   RBC 3.04 (L) 3.87 - 5.11 MIL/uL   Hemoglobin 8.7 (L) 12.0 - 15.0 g/dL   HCT 16.1 (L) 09.6 - 04.5 %   MCV 92.1 80.0 - 100.0 fL   MCH 28.6 26.0 - 34.0 pg   MCHC 31.1 30.0 - 36.0 g/dL   RDW 40.9 (H) 81.1 - 91.4 %   Platelets 132 (L) 150 - 400 K/uL   nRBC 0.0 0.0 - 0.2 %   Neutrophils Relative % 76 %   Neutro Abs 5.6 1.7 - 7.7 K/uL   Lymphocytes Relative 19 %   Lymphs Abs 1.4 0.7 - 4.0 K/uL   Monocytes Relative 3 %   Monocytes Absolute 0.2 0.1 - 1.0 K/uL   Eosinophils Relative 1 %   Eosinophils Absolute 0.1 0.0 - 0.5 K/uL   Basophils Relative 0 %   Basophils Absolute 0.0 0.0 - 0.1 K/uL   Immature Granulocytes 1 %   Abs Immature Granulocytes 0.10 (H) 0.00 - 0.07 K/uL  Basic metabolic panel  Result Value Ref Range   Sodium 136 135 - 145 mmol/L   Potassium 4.5 3.5 - 5.1 mmol/L   Chloride 107 98 - 111 mmol/L   CO2 19 (L) 22 - 32 mmol/L   Glucose, Bld 291 (H) 70 - 99 mg/dL   BUN 58 (H) 8 - 23 mg/dL   Creatinine, Ser 7.82 (H) 0.44 - 1.00 mg/dL  Calcium 8.3 (L) 8.9 -  10.3 mg/dL   GFR, Estimated 22 (L) >60 mL/min   Anion gap 10 5 - 15  CBG monitoring, ED  Result Value Ref Range   Glucose-Capillary 287 (H) 70 - 99 mg/dL  Troponin I (High Sensitivity)  Result Value Ref Range   Troponin I (High Sensitivity) 81 (H) <18 ng/L  Troponin I (High Sensitivity)  Result Value Ref Range   Troponin I (High Sensitivity) 75 (H) <18 ng/L   DG Chest Portable 1 View  Result Date: 08/09/2022 CLINICAL DATA:  Cough EXAM: PORTABLE CHEST 1 VIEW COMPARISON:  10/25/2016 FINDINGS: Lungs are clear. No pneumothorax. Small left pleural effusion has developed. Right subclavian dual lead pacemaker is unchanged. Cardiomegaly is stable when accounting for patient positioning. Mild bilateral perihilar interstitial pulmonary infiltrate has developed most in keeping with mild cardiogenic failure. No acute bone abnormality. IMPRESSION: 1. Mild cardiogenic failure with small left pleural effusion. Electronically Signed   By: Helyn Numbers M.D.   On: 08/09/2022 00:20    EKG EKG Interpretation  Date/Time:  Wednesday August 09 2022 00:01:54 EDT Ventricular Rate:  67 PR Interval:    QRS Duration: 172 QT Interval:  474 QTC Calculation: 497 R Axis:   -86 Text Interpretation: AV dual-paced rhythm Left bundle branch block Confirmed by Nicanor Alcon, Aala Ransom ([id number]) on 08/09/2022 12:05:42 AM  Radiology DG Chest Portable 1 View  Result Date: 08/09/2022 CLINICAL DATA:  Cough EXAM: PORTABLE CHEST 1 VIEW COMPARISON:  10/25/2016 FINDINGS: Lungs are clear. No pneumothorax. Small left pleural effusion has developed. Right subclavian dual lead pacemaker is unchanged. Cardiomegaly is stable when accounting for patient positioning. Mild bilateral perihilar interstitial pulmonary infiltrate has developed most in keeping with mild cardiogenic failure. No acute bone abnormality. IMPRESSION: 1. Mild cardiogenic failure with small left pleural effusion. Electronically Signed   By: Helyn Numbers M.D.   On: 08/09/2022  00:20    Procedures Procedures    Medications Ordered in ED Medications  furosemide (LASIX) injection 40 mg (has no administration in time range)    ED Course/ Medical Decision Making/ A&P                             Medical Decision Making Patient with afib and ckd presents with SOB   Amount and/or Complexity of Data Reviewed Independent Historian: EMS    Details: See above  External Data Reviewed: notes.    Details: Previous notes reviewed  Labs: ordered.    Details: Elevated troponin 81, negative covid and flu, white count is normal 7.4, hemoglobin 8.7, low platelets 132.  Normal sodium 136, normal potassium 4.5, elevated creatinine 2.16 Radiology: ordered and independent interpretation performed.    Details: Chf by me on CXR ECG/medicine tests: ordered and independent interpretation performed. Decision-making details documented in ED Course.  Risk Prescription drug management. Decision regarding hospitalization. Risk Details: Patient with CHF, will admit.      Final Clinical Impression(s) / ED Diagnoses Final diagnoses:  Acute congestive heart failure, unspecified heart failure type (HCC)  Elevated troponin I level   The patient appears reasonably stabilized for admission considering the current resources, flow, and capabilities available in the ED at this time, and I doubt any other [facility] requiring further screening and/or treatment in the ED prior to admission.     Mystique Bjelland, MD 08/09/22 [phone]

## 2022-08-09 NOTE — ED Notes (Signed)
ED TO INPATIENT HANDOFF REPORT  ED Nurse Name and Phone #: Tiffany [name] Name/Age/Gender Tiffany [name] Que 89 y.o. female Room/Bed: WA12/WA12  Code Status   Code Status: DNR  Home/SNF/Other Home Patient oriented to: self, place, time, and situation Is this baseline? Yes   Triage Complete: Triage complete  Chief Complaint CHF exacerbation [facility]) [I50.9]  Triage Note  Patient BIB EMS for hyperglycemia and wheezing.  Patient was seen earlier at [facility] for cough/SOB.  CBG per EMS was 325.  SPO2 94% on RA.  Expiratory wheezing heard in lower lobes.  Patient given duo neb and solumedrol by EMS.  No pain at this time.   Allergies Allergies  Allergen Reactions   Other Other (See Comments)    Nuts, Corn, popcorn - diverticulitis   Sitagliptin     Other reaction(s): Unknown    Level of Care/Admitting Diagnosis ED Disposition     ED Disposition  Admit   Condition  --   Comment  Hospital Area: [facility] [[id number]]  Level of Care: Telemetry [5]  Admit to tele based on following criteria: Monitor for Ischemic changes  May admit patient to [facility] or [facility] if equivalent level of care is available:: Yes  Covid Evaluation: Asymptomatic - no recent exposure (last 10 days) testing not required  Diagnosis: CHF exacerbation [facility]) [[id number]]  Admitting Physician: Darlin Drop [[id number]]  Attending Physician: Darlin Drop [[id number]]  Certification:: I certify this patient will need inpatient services for at least 2 midnights  Estimated Length of Stay: 2          B Medical/Surgery History Past Medical History:  Diagnosis Date   Chronic kidney disease    Diverticulosis    DM2 (diabetes mellitus, type 2) (HCC)    Esophageal reflux    HTN (hypertension)    Hyperlipidemia    Hypothyroidism    Microalbuminuria    last done in 1/07    Paroxysmal atrial fibrillation (HCC)    and atrial tachycardia   Presence of permanent cardiac pacemaker    Stroke  (HCC) 1999   Tachycardia-bradycardia syndrome (HCC)    s/p PPM   Unspecified hypothyroidism 11/20/2013   Past Surgical History:  Procedure Laterality Date   CHOLECYSTECTOMY     COLONOSCOPY N/A 11/21/2013   Procedure: COLONOSCOPY;  Surgeon: Petra Kuba, MD;  Location: [facility] ENDOSCOPY;  Service: Endoscopy;  Laterality: N/A;   ESOPHAGOGASTRODUODENOSCOPY (EGD) WITH PROPOFOL N/A 05/26/2020   Procedure: ESOPHAGOGASTRODUODENOSCOPY (EGD) WITH PROPOFOL;  Surgeon: Kerin Salen, MD;  Location: [facility] ENDOSCOPY;  Service: Gastroenterology;  Laterality: N/A;   HEMORRHOID SURGERY     hysterectomy (other)     PACEMAKER GENERATOR CHANGE  10/03/12   DMT Advisa DR gen change by Dr Johney Frame   PACEMAKER GENERATOR CHANGE N/A 10/03/2012   Procedure: PACEMAKER GENERATOR CHANGE;  Surgeon: Hillis Range, MD;  Location: [facility] CATH LAB;  Service: Cardiovascular;  Laterality: N/A;   permanent pacemaker  1999   updated 2006 (MDT) by Dr Reyes Ivan; gen change 09-2012 by Dr Johney Frame (MDT)   PPM GENERATOR CHANGEOUT N/A 12/11/2019   Procedure: PPM GENERATOR CHANGEOUT;  Surgeon: Hillis Range, MD;  Location: [facility] INVASIVE CV LAB;  Service: Cardiovascular;  Laterality: N/A;     A IV Location/Drains/Wounds Patient Lines/Drains/Airways Status     Active Line/Drains/Airways     Name Placement date Placement time Site Days   Peripheral IV 08/09/22 20 G 1" Anterior;Left Forearm 08/09/22  0001  Forearm  less than 1  Intake/Output Last 24 hours  Intake/Output Summary (Last 24 hours) at 08/09/2022 1301 Last data filed at 08/09/2022 0708 Gross per 24 hour  Intake --  Output 300 ml  Net -300 ml    Labs/Imaging Results for orders placed or performed during the hospital encounter of 08/08/22 (from the past 48 hour(s))  CBG monitoring, ED     Status: Abnormal   Collection Time: 08/08/22 11:57 PM  Result Value Ref Range   Glucose-Capillary 287 (H) 70 - 99 mg/dL    Comment: Glucose reference range applies only to samples taken  after fasting for at least 8 hours.  Resp panel by RT-PCR (RSV, Flu A&B, Covid) Anterior Nasal Swab     Status: None   Collection Time: 08/09/22 12:07 AM   Specimen: Anterior Nasal Swab  Result Value Ref Range   SARS Coronavirus 2 by RT PCR NEGATIVE NEGATIVE    Comment: (NOTE) SARS-CoV-2 target nucleic acids are NOT DETECTED.  The SARS-CoV-2 RNA is generally detectable in upper respiratory specimens during the acute phase of infection. The lowest concentration of SARS-CoV-2 viral copies this assay can detect is 138 copies/mL. A negative result does not preclude SARS-Cov-2 infection and should not be used as the sole basis for treatment or other patient management decisions. A negative result may occur with  improper specimen collection/handling, submission of specimen other than nasopharyngeal swab, presence of viral mutation(s) within the areas targeted by this assay, and inadequate number of viral copies(<138 copies/mL). A negative result must be combined with clinical observations, patient history, and epidemiological information. The expected result is Negative.  Fact Sheet for Patients:  BloggerCourse.com  Fact Sheet for Healthcare Providers:  SeriousBroker.it  This test is no t yet approved or cleared by the [place] FDA and  has been authorized for detection and/or diagnosis of SARS-CoV-2 by FDA under an Emergency Use Authorization (EUA). This EUA will remain  in effect (meaning this test can be used) for the duration of the COVID-19 declaration under Section 564(b)(1) of the Act, 21 U.S.C.section 360bbb-3(b)(1), unless the authorization is terminated  or revoked sooner.       Influenza A by PCR NEGATIVE NEGATIVE   Influenza B by PCR NEGATIVE NEGATIVE    Comment: (NOTE) The Xpert Xpress SARS-CoV-2/FLU/RSV plus assay is intended as an aid in the diagnosis of influenza from Nasopharyngeal swab specimens and should  not be used as a sole basis for treatment. Nasal washings and aspirates are unacceptable for Xpert Xpress SARS-CoV-2/FLU/RSV testing.  Fact Sheet for Patients: BloggerCourse.com  Fact Sheet for Healthcare Providers: SeriousBroker.it  This test is not yet approved or cleared by the [place] FDA and has been authorized for detection and/or diagnosis of SARS-CoV-2 by FDA under an Emergency Use Authorization (EUA). This EUA will remain in effect (meaning this test can be used) for the duration of the COVID-19 declaration under Section 564(b)(1) of the Act, 21 U.S.C. section 360bbb-3(b)(1), unless the authorization is terminated or revoked.     Resp Syncytial Virus by PCR NEGATIVE NEGATIVE    Comment: (NOTE) Fact Sheet for Patients: BloggerCourse.com  Fact Sheet for Healthcare Providers: SeriousBroker.it  This test is not yet approved or cleared by the [place] FDA and has been authorized for detection and/or diagnosis of SARS-CoV-2 by FDA under an Emergency Use Authorization (EUA). This EUA will remain in effect (meaning this test can be used) for the duration of the COVID-19 declaration under Section 564(b)(1) of the Act, 21 U.S.C. section 360bbb-3(b)(1), unless the  authorization is terminated or revoked.  Performed at [facility], [address] [place], [place] [address]   CBC with Differential     Status: Abnormal   Collection Time: 08/09/22 12:23 AM  Result Value Ref Range   WBC 7.4 4.0 - 10.5 K/uL   RBC 3.04 (L) 3.87 - 5.11 MIL/uL   Hemoglobin 8.7 (L) 12.0 - 15.0 g/dL   HCT 60.4 (L) 54.0 - 98.1 %   MCV 92.1 80.0 - 100.0 fL   MCH 28.6 26.0 - 34.0 pg   MCHC 31.1 30.0 - 36.0 g/dL   RDW 19.1 (H) 47.8 - 29.5 %   Platelets 132 (L) 150 - 400 K/uL   nRBC 0.0 0.0 - 0.2 %   Neutrophils Relative % 76 %   Neutro Abs 5.6 1.7 - 7.7 K/uL    Lymphocytes Relative 19 %   Lymphs Abs 1.4 0.7 - 4.0 K/uL   Monocytes Relative 3 %   Monocytes Absolute 0.2 0.1 - 1.0 K/uL   Eosinophils Relative 1 %   Eosinophils Absolute 0.1 0.0 - 0.5 K/uL   Basophils Relative 0 %   Basophils Absolute 0.0 0.0 - 0.1 K/uL   Immature Granulocytes 1 %   Abs Immature Granulocytes 0.10 (H) 0.00 - 0.07 K/uL    Comment: Performed at [facility], [address], [place], [place] [address]  Basic metabolic panel     Status: Abnormal   Collection Time: 08/09/22 12:23 AM  Result Value Ref Range   Sodium 136 135 - 145 mmol/L   Potassium 4.5 3.5 - 5.1 mmol/L   Chloride 107 98 - 111 mmol/L   CO2 19 (L) 22 - 32 mmol/L   Glucose, Bld 291 (H) 70 - 99 mg/dL    Comment: Glucose reference range applies only to samples taken after fasting for at least 8 hours.   BUN 58 (H) 8 - 23 mg/dL   Creatinine, Ser 8.65 (H) 0.44 - 1.00 mg/dL   Calcium 8.3 (L) 8.9 - 10.3 mg/dL   GFR, Estimated 22 (L) >60 mL/min    Comment: (NOTE) Calculated using the CKD-EPI Creatinine Equation (2021)    Anion gap 10 5 - 15    Comment: Performed at [facility], [address], [place], [place] [address]  Troponin I (High Sensitivity)     Status: Abnormal   Collection Time: 08/09/22 12:23 AM  Result Value Ref Range   Troponin I (High Sensitivity) 81 (H) <18 ng/L    Comment: (NOTE) Elevated high sensitivity troponin I (hsTnI) values and significant  changes across serial measurements may suggest ACS but many other  chronic and acute conditions are known to elevate hsTnI results.  Refer to the "Links" section for chest pain algorithms and additional  guidance. Performed at [facility], [address], [place], [place] [address]   Troponin I (High Sensitivity)     Status: Abnormal   Collection Time: 08/09/22  2:16 AM  Result Value Ref Range   Troponin I (High Sensitivity) 75 (H) <18 ng/L    Comment: (NOTE) Elevated high  sensitivity troponin I (hsTnI) values and significant  changes across serial measurements may suggest ACS but many other  chronic and acute conditions are known to elevate hsTnI results.  Refer to the "Links" section for chest pain algorithms and additional  guidance. Performed at [facility], [address], [place], [place] [address]   Protime-INR     Status: Abnormal  Collection Time: 08/09/22  3:00 AM  Result Value Ref Range   Prothrombin Time 25.4 (H) 11.4 - 15.2 seconds   INR 2.3 (H) 0.8 - 1.2    Comment: (NOTE) INR goal varies based on device and disease states. Performed at [facility], [address] [place], [place] [address]   Procalcitonin     Status: None   Collection Time: 08/09/22  5:01 AM  Result Value Ref Range   Procalcitonin <0.10 ng/mL    Comment:        Interpretation: PCT (Procalcitonin) <= 0.5 ng/mL: Systemic infection (sepsis) is not likely. Local bacterial infection is possible. (NOTE)       Sepsis PCT Algorithm           Lower Respiratory Tract                                      Infection PCT Algorithm    ----------------------------     ----------------------------         PCT < 0.25 ng/mL                PCT < 0.10 ng/mL          Strongly encourage             Strongly discourage   discontinuation of antibiotics    initiation of antibiotics    ----------------------------     -----------------------------       PCT 0.25 - 0.50 ng/mL            PCT 0.10 - 0.25 ng/mL               OR       >80% decrease in PCT            Discourage initiation of                                            antibiotics      Encourage discontinuation           of antibiotics    ----------------------------     -----------------------------         PCT >= 0.50 ng/mL              PCT 0.26 - 0.50 ng/mL               AND        <80% decrease in PCT             Encourage initiation of                                              antibiotics       Encourage continuation           of antibiotics    ----------------------------     -----------------------------        PCT >= 0.50 ng/mL                  PCT > 0.50 ng/mL               AND         increase in  PCT                  Strongly encourage                                      initiation of antibiotics    Strongly encourage escalation           of antibiotics                                     -----------------------------                                           PCT <= 0.25 ng/mL                                                 OR                                        > 80% decrease in PCT                                      Discontinue / Do not initiate                                             antibiotics  Performed at [facility], [address] [place], [place] [address]   CBC     Status: Abnormal   Collection Time: 08/09/22  5:01 AM  Result Value Ref Range   WBC 5.7 4.0 - 10.5 K/uL   RBC 2.94 (L) 3.87 - 5.11 MIL/uL   Hemoglobin 8.4 (L) 12.0 - 15.0 g/dL   HCT 60.4 (L) 54.0 - 98.1 %   MCV 92.5 80.0 - 100.0 fL   MCH 28.6 26.0 - 34.0 pg   MCHC 30.9 30.0 - 36.0 g/dL   RDW 19.1 (H) 47.8 - 29.5 %   Platelets 124 (L) 150 - 400 K/uL   nRBC 0.0 0.0 - 0.2 %    Comment: Performed at [facility], [address] [place], [place] [address]  Basic metabolic panel     Status: Abnormal   Collection Time: 08/09/22  5:01 AM  Result Value Ref Range   Sodium 134 (L) 135 - 145 mmol/L   Potassium 4.2 3.5 - 5.1 mmol/L   Chloride 104 98 - 111 mmol/L   CO2 17 (L) 22 - 32 mmol/L   Glucose, Bld 343 (H) 70 - 99 mg/dL    Comment: Glucose reference range applies only to samples taken after fasting for at least 8 hours.   BUN 57 (H) 8 - 23 mg/dL   Creatinine, Ser 8.65 (H) 0.44 - 1.00 mg/dL   Calcium 8.2 (L) 8.9 - 10.3 mg/dL   GFR, Estimated 21 (L) >60  mL/min    Comment: (NOTE) Calculated using the CKD-EPI Creatinine  Equation (2021)    Anion gap 13 5 - 15    Comment: Performed at [facility], [address] [place], [place] [address]  Magnesium     Status: None   Collection Time: 08/09/22  5:01 AM  Result Value Ref Range   Magnesium 2.1 1.7 - 2.4 mg/dL    Comment: Performed at [facility], [address] [place], [place] [address]  Phosphorus     Status: None   Collection Time: 08/09/22  5:01 AM  Result Value Ref Range   Phosphorus 4.4 2.5 - 4.6 mg/dL    Comment: Performed at [facility], [address] [place], [place] [address]  Hemoglobin A1c     Status: Abnormal   Collection Time: 08/09/22  5:01 AM  Result Value Ref Range   Hgb A1c MFr Bld 6.0 (H) 4.8 - 5.6 %    Comment: (NOTE) Pre diabetes:          5.7%-6.4%  Diabetes:              >6.4%  Glycemic control for   <7.0% adults with diabetes    Mean Plasma Glucose 125.5 mg/dL    Comment: Performed at [facility], [address], [place], [place] [address]  Brain natriuretic peptide     Status: Abnormal   Collection Time: 08/09/22  7:00 AM  Result Value Ref Range   B Natriuretic Peptide 229.6 (H) 0.0 - 100.0 pg/mL    Comment: Performed at [facility], [address], [place], [place] [address]  CBG monitoring, ED     Status: Abnormal   Collection Time: 08/09/22  7:43 AM  Result Value Ref Range   Glucose-Capillary 357 (H) 70 - 99 mg/dL    Comment: Glucose reference range applies only to samples taken after fasting for at least 8 hours.  CBG monitoring, ED     Status: Abnormal   Collection Time: 08/09/22 12:06 PM  Result Value Ref Range   Glucose-Capillary 382 (H) 70 - 99 mg/dL    Comment: Glucose reference range applies only to samples taken after fasting for at least 8 hours.   DG Chest Portable 1 View  Result Date: 08/09/2022 CLINICAL DATA:  Cough EXAM: PORTABLE CHEST 1 VIEW COMPARISON:  10/25/2016 FINDINGS: Lungs are clear. No pneumothorax.  Small left pleural effusion has developed. Right subclavian dual lead pacemaker is unchanged. Cardiomegaly is stable when accounting for patient positioning. Mild bilateral perihilar interstitial pulmonary infiltrate has developed most in keeping with mild cardiogenic failure. No acute bone abnormality. IMPRESSION: 1. Mild cardiogenic failure with small left pleural effusion. Electronically Signed   By: Helyn Numbers M.D.   On: 08/09/2022 00:20    Pending Labs Unresulted Labs (From admission, onward)    None       Vitals/Pain Today's Vitals   08/09/22 0645 08/09/22 0650 08/09/22 0700 08/09/22 1040  BP: (!) 124/45  (!) 139/50 (!) 156/51  Pulse: (!) 59  60 60  Resp: 17  19 19   Temp:    (!) 97.5 F (36.4 C)  TempSrc:    Oral  SpO2: 96%  94% 97%  Weight:      Height:      PainSc:  Asleep      Isolation Precautions No active isolations  Medications Medications  acetaminophen (TYLENOL) tablet 650 mg (has no administration in time range)  prochlorperazine (  COMPAZINE) injection 5 mg (has no administration in time range)  polyethylene glycol (MIRALAX / GLYCOLAX) packet 17 g (has no administration in time range)  insulin aspart (novoLOG) injection 0-9 Units (9 Units Subcutaneous Given 08/09/22 1246)  insulin aspart (novoLOG) injection 0-5 Units (has no administration in time range)  rosuvastatin (CRESTOR) tablet 10 mg (10 mg Oral Given 08/09/22 1003)  levothyroxine (SYNTHROID) tablet 50 mcg (50 mcg Oral Given 08/09/22 0751)  furosemide (LASIX) injection 40 mg (40 mg Intravenous Given 08/09/22 1010)  warfarin (COUMADIN) tablet 1.5 mg (has no administration in time range)  Warfarin - Pharmacist Dosing Inpatient (0 each Does not apply Hold 08/09/22 1600)  furosemide (LASIX) injection 40 mg (40 mg Intravenous Given 08/09/22 0254)  furosemide (LASIX) injection 20 mg (20 mg Intravenous Given 08/09/22 0453)    Mobility walks with device     Focused Assessments     R Recommendations: See  Admitting Provider Note  Report given to:   Additional Notes:

## 2022-08-09 NOTE — Progress Notes (Signed)
ANTICOAGULATION CONSULT NOTE - Initial Consult  Pharmacy Consult for Warfarin Indication: atrial fibrillation  Allergies  Allergen Reactions   Other Other (See Comments)    Nuts, Corn, popcorn - diverticulitis   Sitagliptin     Other reaction(s): Unknown    Patient Measurements: Height: 5' 2.5" (158.8 cm) Weight: 68 kg (150 lb) IBW/kg (Calculated) : 51.25 Heparin Dosing Weight:   Vital Signs: Temp: 97.8 F (36.6 C) (06/12 0630) Temp Source: Oral (06/12 0630) BP: 139/50 (06/12 0700) Pulse Rate: 60 (06/12 0700)  Labs: Recent Labs    08/09/22 0023 08/09/22 0216 08/09/22 0300 08/09/22 0501  HGB 8.7*  --   --  8.4*  HCT 28.0*  --   --  27.2*  PLT 132*  --   --  124*  LABPROT  --   --  25.4*  --   INR  --   --  2.3*  --   CREATININE 2.16*  --   --  2.24*  TROPONINIHS 81* 75*  --   --     Estimated Creatinine Clearance: 15.9 mL/min (A) (by C-G formula based on SCr of 2.24 mg/dL (H)).   Medical History: Past Medical History:  Diagnosis Date   Chronic kidney disease    Diverticulosis    DM2 (diabetes mellitus, type 2) (HCC)    Esophageal reflux    HTN (hypertension)    Hyperlipidemia    Hypothyroidism    Microalbuminuria    last done in 1/07    Paroxysmal atrial fibrillation (HCC)    and atrial tachycardia   Presence of permanent cardiac pacemaker    Stroke (HCC) 1999   Tachycardia-bradycardia syndrome (HCC)    s/p PPM   Unspecified hypothyroidism 11/20/2013    Medications:  Scheduled:   furosemide  40 mg Intravenous Daily   insulin aspart  0-5 Units Subcutaneous QHS   insulin aspart  0-9 Units Subcutaneous TID WC   levothyroxine  50 mcg Oral Q0600   rosuvastatin  10 mg Oral Daily   Infusions:   Assessment: 89 yoF admitted on 6/11 with acute on chronic HF exacerbation.  PMH is significant for Afib on chronic warfarin anticoagulation and pharmacy is consulted to continue warfarin dosing.   Potential drug-drug interaction: She was taking Augmentin and  Doxy PTA which may prolong INR  Home warfarin dose is 1.5 mg daily (LD 6/11 6am) with admit INR therapeutic at 2.3 CBC:  Hgb is low/stable at 8.4, Plt 124k No bleeding or complications noted  Goal of Therapy:  INR 2-3 Monitor platelets by anticoagulation protocol: Yes   Plan:  Warfarin 1.5 mg PO x1 dose today.  Daily PT/INR. Monitor for signs and symptoms of bleeding.   Lynann Beaver PharmD, BCPS [facility] main pharmacy [phone] 08/09/2022 10:21 AM

## 2022-08-10 DIAGNOSIS — L8990 Pressure ulcer of unspecified site, unspecified stage: Secondary | ICD-10-CM | POA: Insufficient documentation

## 2022-08-10 DIAGNOSIS — I5031 Acute diastolic (congestive) heart failure: Secondary | ICD-10-CM | POA: Diagnosis not present

## 2022-08-10 DIAGNOSIS — I272 Pulmonary hypertension, unspecified: Secondary | ICD-10-CM | POA: Insufficient documentation

## 2022-08-10 LAB — GLUCOSE, CAPILLARY
Glucose-Capillary: 182 mg/dL — ABNORMAL HIGH (ref 70–99)
Glucose-Capillary: 203 mg/dL — ABNORMAL HIGH (ref 70–99)
Glucose-Capillary: 101 mg/dL — ABNORMAL HIGH (ref 70–99)
Glucose-Capillary: 166 mg/dL — ABNORMAL HIGH (ref 70–99)

## 2022-08-10 LAB — BASIC METABOLIC PANEL
Anion gap: 11 (ref 5–15)
BUN: 71 mg/dL — ABNORMAL HIGH (ref 8–23)
CO2: 21 mmol/L — ABNORMAL LOW (ref 22–32)
Calcium: 8.2 mg/dL — ABNORMAL LOW (ref 8.9–10.3)
Chloride: 104 mmol/L (ref 98–111)
Creatinine, Ser: 2.4 mg/dL — ABNORMAL HIGH (ref 0.44–1.00)
GFR, Estimated: 19 mL/min — ABNORMAL LOW (ref >60)
Glucose, Bld: 212 mg/dL — ABNORMAL HIGH (ref 70–99)
Potassium: 4.2 mmol/L (ref 3.5–5.1)
Sodium: 136 mmol/L (ref 135–145)

## 2022-08-10 LAB — PROTIME-INR
INR: 2.3 — ABNORMAL HIGH (ref 0.8–1.2)
Prothrombin Time: 25.4 seconds — ABNORMAL HIGH (ref 11.4–15.2)

## 2022-08-10 MED ORDER — WARFARIN SODIUM 1 MG PO TABS
1.5000 mg | ORAL_TABLET | Freq: Once | ORAL | Status: AC
  Administered 2022-08-10: 1.5 mg via ORAL
  Filled 2022-08-10: qty 1

## 2022-08-10 MED ORDER — CHLORHEXIDINE GLUCONATE CLOTH 2 % EX PADS
6.0000 | MEDICATED_PAD | Freq: Every day | CUTANEOUS | Status: DC
  Administered 2022-08-10 – 2022-08-12 (×3): 6 via TOPICAL

## 2022-08-10 MED ORDER — ZINC OXIDE 40 % EX OINT
TOPICAL_OINTMENT | CUTANEOUS | Status: DC | PRN
  Filled 2022-08-10: qty 57

## 2022-08-10 MED ORDER — DILTIAZEM HCL ER COATED BEADS 120 MG PO CP24
120.0000 mg | ORAL_CAPSULE | Freq: Every day | ORAL | Status: DC
  Administered 2022-08-10 – 2022-08-12 (×3): 120 mg via ORAL
  Filled 2022-08-10 (×3): qty 1

## 2022-08-10 NOTE — Progress Notes (Signed)
ANTICOAGULATION CONSULT NOTE -   Consult  Pharmacy Consult for Warfarin Indication: atrial fibrillation  Allergies  Allergen Reactions   Other Other (See Comments)    Nuts, Corn, popcorn - diverticulitis   Sitagliptin     Other reaction(s): Unknown    Patient Measurements: Height: 5' (152.4 cm) Weight: 72.4 kg (159 lb 9.8 oz) IBW/kg (Calculated) : 45.5 Heparin Dosing Weight:   Vital Signs: Temp: 98 F (36.7 C) (06/13 0004) Temp Source: Oral (06/13 0004) BP: 135/54 (06/13 0004) Pulse Rate: 56 (06/13 0004)  Labs: Recent Labs    08/09/22 0023 08/09/22 0216 08/09/22 0300 08/09/22 0501 08/10/22 0523 08/10/22 0828  HGB 8.7*  --   --  8.4*  --   --   HCT 28.0*  --   --  27.2*  --   --   PLT 132*  --   --  124*  --   --   LABPROT  --   --  25.4*  --   --  25.4*  INR  --   --  2.3*  --   --  2.3*  CREATININE 2.16*  --   --  2.24* 2.40*  --   TROPONINIHS 81* 75*  --   --   --   --      Estimated Creatinine Clearance: 14.4 mL/min (A) (by C-G formula based on SCr of 2.4 mg/dL (H)).   Medical History: Past Medical History:  Diagnosis Date   Chronic kidney disease    Diverticulosis    DM2 (diabetes mellitus, type 2) (HCC)    Esophageal reflux    HTN (hypertension)    Hyperlipidemia    Hypothyroidism    Microalbuminuria    last done in 1/07    Paroxysmal atrial fibrillation (HCC)    and atrial tachycardia   Presence of permanent cardiac pacemaker    Stroke (HCC) 1999   Tachycardia-bradycardia syndrome (HCC)    s/p PPM   Unspecified hypothyroidism 11/20/2013    Medications:  Scheduled:   furosemide  40 mg Intravenous Daily   insulin aspart  0-5 Units Subcutaneous QHS   insulin aspart  0-9 Units Subcutaneous TID WC   levothyroxine  50 mcg Oral Q0600   rosuvastatin  10 mg Oral Daily   Warfarin - Pharmacist Dosing Inpatient   Does not apply q1600   Infusions:   Assessment: 29 yoF admitted on 6/11 with acute on chronic HF exacerbation.  PMH is significant  for Afib on chronic warfarin anticoagulation and pharmacy is consulted to continue warfarin dosing.   Potential drug-drug interaction: She was taking Augmentin and Doxy PTA which may prolong INR  Home warfarin dose is 1.5 mg daily (LD 6/11 6am) with admit INR therapeutic at 2.3  Today, 08/10/22 INR 2.3, therapeutic  Hgb is low/stable at 8.4, Plt 124k ( 6/12)  No bleeding or complications noted No new interacting meds started or stopped   Goal of Therapy:  INR 2-3 Monitor platelets by anticoagulation protocol: Yes   Plan:  Warfarin 1.5 mg PO x1 dose today.  Daily PT/INR. Monitor for signs and symptoms of bleeding.    Adalberto Cole, PharmD, BCPS 08/10/2022 9:13 AM

## 2022-08-10 NOTE — Progress Notes (Signed)
Mobility Specialist - Progress Note   08/10/22 1327  Oxygen Therapy  O2 Device Nasal Cannula  O2 Flow Rate (L/min) 2 L/min  Mobility  Activity Ambulated with assistance in hallway  Level of Assistance Standby assist, set-up cues, supervision of patient - no hands on  Assistive Device Front wheel walker  Distance Ambulated (ft) 80 ft  Activity Response Tolerated well  Mobility Referral Yes  $Mobility charge 1 Mobility  Mobility Specialist Start Time (ACUTE ONLY) 0112  Mobility Specialist Stop Time (ACUTE ONLY) 0126  Mobility Specialist Time Calculation (min) (ACUTE ONLY) 14 min   Pt received in bed and agreeable to mobility. Unable to obtain O2 stats during ambulation due to pulse ox not working correctly. At EOS, pt had a some coughing, but stated she was okay. Pt to recliner after session with all needs met.    Post-mobility: 72 HR, 95% SPO2 (2L [place])  Chief Technology Officer

## 2022-08-10 NOTE — Progress Notes (Signed)
Catheter continues to leak when patient coughs. Balloon was assessed for positioning and amount, 10ml inflated. Will assess need to replace per order.

## 2022-08-10 NOTE — Progress Notes (Signed)
PROGRESS NOTE    Tiffany [name]  ZOX:[id number] DOB: 1931/04/23 DOA: 08/08/2022 PCP: Tally Joe, MD     Brief Narrative:  Tiffany [name] is a 89 y.o. female with medical history significant for paroxysmal A-fib on Coumadin, HFpEF, hypothyroidism, hyperlipidemia, CKD 4, who presented to [facility] ED from home due to progressive shortness of breath for the past few days.  Associated with a productive cough and audible wheezing.  Denies chest pain.  Chest x-ray revealed mild cardiogenic failure with small left pleural effusion.  Patient was started on IV Lasix and admitted to the hospital.   New events last 24 hours / Subjective: Worked with PT today.  Now sitting in a recliner.  Feeling well overall, denies worsening shortness of breath today  Assessment & Plan:   Principal Problem:   Acute diastolic (congestive) heart failure (HCC) Active Problems:   DM type 2 (diabetes mellitus, type 2) (HCC)   Hyperlipidemia   Essential hypertension   Atrial fibrillation (HCC)   Warfarin-induced coagulopathy (HCC)   Hypothyroidism   Pressure injury of skin   Pulmonary hypertension (HCC)   Acute diastolic CHF Pulmonary HTN  -Presented with shortness of breath, pulmonary edema with effusion -BNP 229.6 -Echocardiogram revealed EF 60 to 65%, LVH, elevated pulmonary artery systolic pressure -Continue IV Lasix -Strict I's and O's, daily weight -Heart healthy diet, fluid restriction -Repeat chest x-ray in the morning   Acute hypoxemic respiratory failure -Dropped down to 88% on room air in the emergency department.  Currently on 2 L nasal cannula O2   Demand ischemia -Insetting of CHF exacerbation -Troponin 81, 75 -Echocardiogram did not reveal regional wall motion abnormalities   Paroxysmal A-fib on Coumadin -Coumadin dosing per pharmacy -Diltiazem   Diabetes mellitus -A1c 6.0 -Sliding scale insulin   CKD stage IV -Baseline creatinine 2.0 -Continue to monitor while on  Lasix   Hyperlipidemia -Crestor   Hypothyroidism -Synthroid     In agreement with assessment of the pressure ulcer as below:  Pressure Injury 08/09/22 Sacrum Mid Stage 1 -  Intact skin with non-blanchable redness of a localized area usually over a bony prominence. pink healing area (Active)  08/09/22 1600  Location: Sacrum  Location Orientation: Mid  Staging: Stage 1 -  Intact skin with non-blanchable redness of a localized area usually over a bony prominence.  Wound Description (Comments): pink healing area  Present on Admission: Yes  Dressing Type None 08/09/22 2000         DVT prophylaxis:  warfarin (COUMADIN) tablet 1.5 mg  Code Status: DNR Family Communication: No family at bedside Disposition Plan: Home Status is: Inpatient Remains inpatient appropriate because: IV Lasix    Antimicrobials:  Anti-infectives (From admission, onward)    None        Objective: Vitals:   08/09/22 2017 08/10/22 0004 08/10/22 0439 08/10/22 1211  BP: (!) 142/57 (!) 135/54  (!) 131/39  Pulse: 62 (!) 56  60  Resp: 20 20  18   Temp: 98 F (36.7 C) 98 F (36.7 C)    TempSrc: Oral Oral    SpO2: 99% 96%  100%  Weight:   72.4 kg   Height:        Intake/Output Summary (Last 24 hours) at 08/10/2022 1312 Last data filed at 08/10/2022 1128 Gross per 24 hour  Intake 680 ml  Output 1000 ml  Net -320 ml   Filed Weights   08/08/22 2358 08/09/22 1553 08/10/22 0439  Weight: 68 kg 70.8 kg 72.4 kg  Examination:  General exam: Appears calm and comfortable  Respiratory system: Clear to auscultation. Respiratory effort normal. No respiratory distress. No conversational dyspnea.  On room air Cardiovascular system: S1 & S2 heard. No murmurs.  Minimal pedal edema. Gastrointestinal system: Abdomen is nondistended, soft and nontender. Normal bowel sounds heard. Central nervous system: Alert and oriented. No focal neurological deficits. Speech clear.  Extremities: Symmetric in appearance   Skin: No rashes, lesions or ulcers on exposed skin  Psychiatry: Judgement and insight appear normal. Mood & affect appropriate.   Data Reviewed: I have personally reviewed following labs and imaging studies  CBC: Recent Labs  Lab 08/09/22 0023 08/09/22 0501  WBC 7.4 5.7  NEUTROABS 5.6  --   HGB 8.7* 8.4*  HCT 28.0* 27.2*  MCV 92.1 92.5  PLT 132* 124*   Basic Metabolic Panel: Recent Labs  Lab 08/09/22 0023 08/09/22 0501 08/10/22 0523  NA 136 134* 136  K 4.5 4.2 4.2  CL 107 104 104  CO2 19* 17* 21*  GLUCOSE 291* 343* 212*  BUN 58* 57* 71*  CREATININE 2.16* 2.24* 2.40*  CALCIUM 8.3* 8.2* 8.2*  MG  --  2.1  --   PHOS  --  4.4  --    GFR: Estimated Creatinine Clearance: 14.4 mL/min (A) (by C-G formula based on SCr of 2.4 mg/dL (H)). Liver Function Tests: No results for input(s): "AST", "ALT", "ALKPHOS", "BILITOT", "PROT", "ALBUMIN" in the last 168 hours. No results for input(s): "LIPASE", "AMYLASE" in the last 168 hours. No results for input(s): "AMMONIA" in the last 168 hours. Coagulation Profile: Recent Labs  Lab 08/09/22 0300 08/10/22 0828  INR 2.3* 2.3*   Cardiac Enzymes: No results for input(s): "CKTOTAL", "CKMB", "CKMBINDEX", "TROPONINI" in the last 168 hours. BNP (last 3 results) No results for input(s): "PROBNP" in the last 8760 hours. HbA1C: Recent Labs    08/09/22 0501  HGBA1C 6.0*   CBG: Recent Labs  Lab 08/09/22 1206 08/09/22 1628 08/09/22 2111 08/10/22 0725 08/10/22 1138  GLUCAP 382* 225* 224* 182* 203*   Lipid Profile: No results for input(s): "CHOL", "HDL", "LDLCALC", "TRIG", "CHOLHDL", "LDLDIRECT" in the last 72 hours. Thyroid Function Tests: No results for input(s): "TSH", "T4TOTAL", "FREET4", "T3FREE", "THYROIDAB" in the last 72 hours. Anemia Panel: No results for input(s): "VITAMINB12", "FOLATE", "FERRITIN", "TIBC", "IRON", "RETICCTPCT" in the last 72 hours. Sepsis Labs: Recent Labs  Lab 08/09/22 0501  PROCALCITON <0.10     Recent Results (from the past 240 hour(s))  Resp panel by RT-PCR (RSV, Flu A&B, Covid) Anterior Nasal Swab     Status: None   Collection Time: 08/09/22 12:07 AM   Specimen: Anterior Nasal Swab  Result Value Ref Range Status   SARS Coronavirus 2 by RT PCR NEGATIVE NEGATIVE Final    Comment: (NOTE) SARS-CoV-2 target nucleic acids are NOT DETECTED.  The SARS-CoV-2 RNA is generally detectable in upper respiratory specimens during the acute phase of infection. The lowest concentration of SARS-CoV-2 viral copies this assay can detect is 138 copies/mL. A negative result does not preclude SARS-Cov-2 infection and should not be used as the sole basis for treatment or other patient management decisions. A negative result may occur with  improper specimen collection/handling, submission of specimen other than nasopharyngeal swab, presence of viral mutation(s) within the areas targeted by this assay, and inadequate number of viral copies(<138 copies/mL). A negative result must be combined with clinical observations, patient history, and epidemiological information. The expected result is Negative.  Fact Sheet for Patients:  BloggerCourse.com  Fact Sheet for Healthcare Providers:  SeriousBroker.it  This test is no t yet approved or cleared by the [place] FDA and  has been authorized for detection and/or diagnosis of SARS-CoV-2 by FDA under an Emergency Use Authorization (EUA). This EUA will remain  in effect (meaning this test can be used) for the duration of the COVID-19 declaration under Section 564(b)(1) of the Act, 21 U.S.C.section 360bbb-3(b)(1), unless the authorization is terminated  or revoked sooner.       Influenza A by PCR NEGATIVE NEGATIVE Final   Influenza B by PCR NEGATIVE NEGATIVE Final    Comment: (NOTE) The Xpert Xpress SARS-CoV-2/FLU/RSV plus assay is intended as an aid in the diagnosis of influenza from  Nasopharyngeal swab specimens and should not be used as a sole basis for treatment. Nasal washings and aspirates are unacceptable for Xpert Xpress SARS-CoV-2/FLU/RSV testing.  Fact Sheet for Patients: BloggerCourse.com  Fact Sheet for Healthcare Providers: SeriousBroker.it  This test is not yet approved or cleared by the [place] FDA and has been authorized for detection and/or diagnosis of SARS-CoV-2 by FDA under an Emergency Use Authorization (EUA). This EUA will remain in effect (meaning this test can be used) for the duration of the COVID-19 declaration under Section 564(b)(1) of the Act, 21 U.S.C. section 360bbb-3(b)(1), unless the authorization is terminated or revoked.     Resp Syncytial Virus by PCR NEGATIVE NEGATIVE Final    Comment: (NOTE) Fact Sheet for Patients: BloggerCourse.com  Fact Sheet for Healthcare Providers: SeriousBroker.it  This test is not yet approved or cleared by the [place] FDA and has been authorized for detection and/or diagnosis of SARS-CoV-2 by FDA under an Emergency Use Authorization (EUA). This EUA will remain in effect (meaning this test can be used) for the duration of the COVID-19 declaration under Section 564(b)(1) of the Act, 21 U.S.C. section 360bbb-3(b)(1), unless the authorization is terminated or revoked.  Performed at [facility], [address], [place], [place] [address]       Radiology Studies: ECHOCARDIOGRAM COMPLETE  Result Date: 08/09/2022    ECHOCARDIOGRAM REPORT   Patient Name:   YOSELI RUBALCAVA [facility] Date of Exam: 08/09/2022 Medical Rec #:  [id number]              Height:       62.5 in Accession #:    [id number]             Weight:       150.0 lb Date of Birth:  1931/10/09             BSA:          1.702 m Patient Age:    89 years               BP:           139/50 mmHg Patient Gender: F                       HR:           62 bpm. Exam Location:  Inpatient Procedure: 2D Echo, Cardiac Doppler and Color Doppler Indications:    CHF-Acute Diastolic I50.31  History:        Patient has no prior history of Echocardiogram examinations.                 Cardiomyopathy and CHF, Pacemaker, Stroke, Arrythmias:Atrial  Fibrillation and Tachycardia; Risk Factors:Hypertension,                 Diabetes and Dyslipidemia.  Sonographer:    Lucendia Herrlich Referring Phys: [id number] CAROLE N HALL IMPRESSIONS  1. Left ventricular ejection fraction, by estimation, is 60 to 65%. The left ventricle has normal function. The left ventricle has no regional wall motion abnormalities. There is severe asymmetric left ventricular hypertrophy of the inferior segment. Left ventricular diastolic parameters are indeterminate.  2. Right ventricular systolic function is normal. The right ventricular size is normal. There is severely elevated pulmonary artery systolic pressure. The estimated right ventricular systolic pressure is 85.6 mmHg.  3. Left atrial size was severely dilated.  4. Right atrial size was severely dilated.  5. 2D Mitral valve area 2.77 cm2; Caseous mitral annular calcification noted. The mitral valve is degenerative. Moderate mitral valve regurgitation. Mild to moderate mitral stenosis. The mean mitral valve gradient is 5.0 mmHg.  6. Tricuspid valve regurgitation is moderate to severe.  7. The aortic valve is tricuspid. There is mild calcification of the aortic valve. There is mild thickening of the aortic valve. Aortic valve regurgitation is moderate. Moderate aortic valve stenosis. Aortic regurgitation PHT measures 350 msec. Aortic valve mean gradient measures 26.0 mmHg.  8. The inferior vena cava is dilated in size with <50% respiratory variability, suggesting right atrial pressure of 15 mmHg. Comparison(s): No prior Echocardiogram. FINDINGS  Left Ventricle: Basal inferior hypertrophy 28 mm at largest. Left  ventricular ejection fraction, by estimation, is 60 to 65%. The left ventricle has normal function. The left ventricle has no regional wall motion abnormalities. The left ventricular internal cavity size was normal in size. There is severe asymmetric left ventricular hypertrophy of the inferior segment. Left ventricular diastolic parameters are indeterminate. Right Ventricle: The right ventricular size is normal. No increase in right ventricular wall thickness. Right ventricular systolic function is normal. There is severely elevated pulmonary artery systolic pressure. The tricuspid regurgitant velocity is 4.20 m/s, and with an assumed right atrial pressure of 15 mmHg, the estimated right ventricular systolic pressure is 85.6 mmHg. Left Atrium: Left atrial size was severely dilated. Right Atrium: Right atrial size was severely dilated. Pericardium: There is no evidence of pericardial effusion. Mitral Valve: 2D Mitral valve area 2.77 cm2; Caseous mitral annular calcification noted. The mitral valve is degenerative in appearance. Moderate mitral valve regurgitation. Mild to moderate mitral valve stenosis. MV peak gradient, 14.2 mmHg. The mean mitral valve gradient is 5.0 mmHg with average heart rate of 61 bpm. Tricuspid Valve: The tricuspid valve is normal in structure. Tricuspid valve regurgitation is moderate to severe. No evidence of tricuspid stenosis. Aortic Valve: The aortic valve is tricuspid. There is mild calcification of the aortic valve. There is mild thickening of the aortic valve. There is mild aortic valve annular calcification. Aortic valve regurgitation is moderate. Aortic regurgitation PHT  measures 350 msec. Moderate aortic stenosis is present. Aortic valve mean gradient measures 26.0 mmHg. Aortic valve peak gradient measures 45.8 mmHg. Aortic valve area, by VTI measures 1.00 cm. Pulmonic Valve: Eccentric jet. The pulmonic valve was normal in structure. Pulmonic valve regurgitation is mild to  moderate. No evidence of pulmonic stenosis. Aorta: The aortic root and ascending aorta are structurally normal, with no evidence of dilitation. Venous: The inferior vena cava is dilated in size with less than 50% respiratory variability, suggesting right atrial pressure of 15 mmHg. IAS/Shunts: No atrial level shunt detected by color flow Doppler. Additional Comments: A  device lead is visualized in the right ventricle and right atrium.  LEFT VENTRICLE PLAX 2D LVIDd:         3.90 cm   Diastology LVIDs:         2.80 cm   LV e' medial:    4.95 cm/s LV PW:         2.00 cm   LV E/e' medial:  32.0 LV IVS:        1.10 cm   LV e' lateral:   13.80 cm/s LVOT diam:     2.00 cm   LV E/e' lateral: 11.5 LV SV:         82 LV SV Index:   48 LVOT Area:     3.14 cm  RIGHT VENTRICLE             IVC RV S prime:     10.50 cm/s  IVC diam: 2.50 cm TAPSE (M-mode): 1.4 cm LEFT ATRIUM             Index        RIGHT ATRIUM           Index LA diam:        5.20 cm 3.06 cm/m   RA Area:     35.30 cm LA Vol (A2C):   86.3 ml 50.71 ml/m  RA Volume:   137.00 ml 80.50 ml/m LA Vol (A4C):   93.4 ml 54.88 ml/m LA Biplane Vol: 97.2 ml 57.12 ml/m  AORTIC VALVE AV Area (Vmax):    1.00 cm AV Area (Vmean):   1.00 cm AV Area (VTI):     1.00 cm AV Vmax:           338.50 cm/s AV Vmean:          228.500 cm/s AV VTI:            0.818 m AV Peak Grad:      45.8 mmHg AV Mean Grad:      26.0 mmHg LVOT Vmax:         107.67 cm/s LVOT Vmean:        72.733 cm/s LVOT VTI:          0.261 m LVOT/AV VTI ratio: 0.32 AI PHT:            350 msec  AORTA Ao Root diam: 3.40 cm Ao Asc diam:  3.50 cm MITRAL VALVE                TRICUSPID VALVE MV Area (PHT): 6.96 cm     TR Peak grad:   70.6 mmHg MV Area VTI:   2.20 cm     TR Vmax:        420.00 cm/s MV Peak grad:  14.2 mmHg MV Mean grad:  5.0 mmHg     SHUNTS MV Vmax:       1.88 m/s     Systemic VTI:  0.26 m MV Vmean:      84.2 cm/s    Systemic Diam: 2.00 cm MV Decel Time: 109 msec MR Peak grad: 120.1 mmHg MR Vmax:       548.00 cm/s MV E velocity: 158.50 cm/s MV A velocity: 82.50 cm/s MV E/A ratio:  1.92 Riley Lam MD Electronically signed by Riley Lam MD Signature Date/Time: 08/09/2022/4:32:29 PM    Final    DG Chest Portable 1 View  Result Date: 08/09/2022 CLINICAL DATA:  Cough EXAM: PORTABLE CHEST 1 VIEW COMPARISON:  10/25/2016 FINDINGS: Lungs are  clear. No pneumothorax. Small left pleural effusion has developed. Right subclavian dual lead pacemaker is unchanged. Cardiomegaly is stable when accounting for patient positioning. Mild bilateral perihilar interstitial pulmonary infiltrate has developed most in keeping with mild cardiogenic failure. No acute bone abnormality. IMPRESSION: 1. Mild cardiogenic failure with small left pleural effusion. Electronically Signed   By: Helyn Numbers M.D.   On: 08/09/2022 00:20      Scheduled Meds:  diltiazem  120 mg Oral Daily   furosemide  40 mg Intravenous Daily   insulin aspart  0-5 Units Subcutaneous QHS   insulin aspart  0-9 Units Subcutaneous TID WC   levothyroxine  50 mcg Oral Q0600   rosuvastatin  10 mg Oral Daily   warfarin  1.5 mg Oral ONCE-1600   Warfarin - Pharmacist Dosing Inpatient   Does not apply q1600   Continuous Infusions:   LOS: 1 day   Time spent: 25 minutes   Noralee Stain, DO [facility] 08/10/2022, 1:12 PM   Available via Epic secure chat 7am-7pm After these hours, please refer to coverage provider listed on [email or web address]

## 2022-08-10 NOTE — Evaluation (Signed)
Physical Therapy Evaluation Patient Details Name: Tiffany [name] MRN: [id number] DOB: Jul 08, 1931 Today's Date: 08/10/2022  History of Present Illness  Tiffany [name] is an 89 y.o. female admitted for CHF exacerbation. Chest x-ray revealed mild cardiogenic failure with small left pleural effusion. PMH:  paroxysmal A-fib, HFpEF, hypothyroidism, hyperlipidemia, CKD 4, HTN, s/p pacemaker  Clinical Impression  Pt admitted with above diagnosis. Pt from home using RW at baseline, ind with self care and minimal household chores, daughters assist with grocery shopping and driving pt to appointments. Pt mobilizing with min G-supv, using RW. Pt on 2L upon arrival with SpO2 99%, placed on RA due to baseline with SpO2 94-96% with mobility. Returned 2L at [employer] and notified RN of SpO2. Will follow pt acutely, anticipate no d/u after discharge. Pt currently with functional limitations due to the deficits listed below (see PT Problem List). Pt will benefit from acute skilled PT to increase their independence and safety with mobility to allow discharge.          Recommendations for follow up therapy are one component of a multi-disciplinary discharge planning process, led by the attending physician.  Recommendations may be updated based on patient status, additional functional criteria and insurance authorization.  Follow Up Recommendations       Assistance Recommended at Discharge PRN  Patient can return home with the following  Assistance with cooking/housework;Assist for transportation    Equipment Recommendations None recommended by PT  Recommendations for Other Services       Functional Status Assessment Patient has had a recent decline in their functional status and demonstrates the ability to make significant improvements in function in a reasonable and predictable amount of time.     Precautions / Restrictions Precautions Precautions: Fall Restrictions Weight Bearing  Restrictions: No      Mobility  Bed Mobility Overal bed mobility: Needs Assistance Bed Mobility: Supine to Sit     Supine to sit: Supervision     General bed mobility comments: slightly increased time, HOB elevated    Transfers Overall transfer level: Needs assistance Equipment used: Rolling walker (2 wheels) Transfers: Sit to/from Stand Sit to Stand: Min guard           General transfer comment: slow to power up, cues for hand placement    Ambulation/Gait Ambulation/Gait assistance: Supervision Gait Distance (Feet): 90 Feet Assistive device: Rolling walker (2 wheels) Gait Pattern/deviations: Step-through pattern, Decreased stride length Gait velocity: slightly decreased     General Gait Details: step through gait pattern, trunk slightly flexed, good bil foot clearance  Stairs            Wheelchair Mobility    Modified Rankin (Stroke Patients Only)       Balance Overall balance assessment: Mild deficits observed, not formally tested                                           Pertinent Vitals/Pain Pain Assessment Pain Assessment: No/denies pain    Home Living Family/patient expects to be discharged to:: Private residence Living Arrangements: Alone Available Help at Discharge: Family;Available PRN/intermittently Type of Home: Apartment Home Access: Level entry       Home Layout: One level Home Equipment: Agricultural consultant (2 wheels);Shower seat Additional Comments: pt reports chronic foley catheter for ~5 years    Prior Function Prior Level of Function : Independent/Modified Independent  Mobility Comments: pt reports household ambulator with RW, sleeps in adjustable bed ADLs Comments: pt reports ind with self care, does minimal cleaning and cooking; daughters grocery shop for pt     Hand Dominance        Extremity/Trunk Assessment   Upper Extremity Assessment Upper Extremity Assessment: Overall WFL for  tasks assessed    Lower Extremity Assessment Lower Extremity Assessment: Overall WFL for tasks assessed (AROM WFL, strength grossly 4-/5, denies numbness/tingling)    Cervical / Trunk Assessment Cervical / Trunk Assessment: Kyphotic  Communication   Communication: HOH  Cognition Arousal/Alertness: Awake/alert Behavior During Therapy: WFL for tasks assessed/performed Overall Cognitive Status: Within Functional Limits for tasks assessed                                          General Comments General comments (skin integrity, edema, etc.): Pt on RA with SpO2 94-96% notified RN    Exercises     Assessment/Plan    PT Assessment Patient needs continued PT services  PT Problem List Decreased activity tolerance;Decreased balance;Cardiopulmonary status limiting activity       PT Treatment Interventions DME instruction;Gait training;Functional mobility training;Therapeutic activities;Therapeutic exercise;Balance training;Neuromuscular re-education;Patient/family education    PT Goals (Current goals can be found in the Care Plan section)  Acute Rehab PT Goals Patient Stated Goal: return home PT Goal Formulation: With patient Time For Goal Achievement: 08/24/22 Potential to Achieve Goals: Good    Frequency Min 1X/week     Co-evaluation               AM-PAC PT "6 Clicks" Mobility  Outcome Measure Help needed turning from your back to your side while in a flat bed without using bedrails?: A Little Help needed moving from lying on your back to sitting on the side of a flat bed without using bedrails?: A Little Help needed moving to and from a bed to a chair (including a wheelchair)?: A Little Help needed standing up from a chair using your arms (e.g., wheelchair or bedside chair)?: A Little Help needed to walk in hospital room?: A Little Help needed climbing 3-5 steps with a railing? : A Little 6 Click Score: 18    End of Session Equipment Utilized  During Treatment: Gait belt;Oxygen Activity Tolerance: Patient tolerated treatment well Patient left: in chair;with call bell/phone within reach Nurse Communication: Mobility status;Other (comment) (SPO2) PT Visit Diagnosis: Other abnormalities of gait and mobility (R26.89)    Time: 4098-1191 PT Time Calculation (min) (ACUTE ONLY): 23 min   Charges:   PT Evaluation $PT Eval Low Complexity: 1 Low PT Treatments $Gait Training: 8-22 mins        Tori Lilleigh Hechavarria PT, DPT 08/10/22, 9:50 AM

## 2022-08-10 NOTE — TOC Initial Note (Signed)
Transition of Care [facility]) - Initial/Assessment Note    Patient Details  Name: Tiffany [name] MRN: [id number] Date of Birth: 11/18/1931  Transition of Care [facility] CM/SW Contact:    Adrian Prows, RN Phone Number: 08/10/2022, 10:11 AM  Clinical Narrative:                 [facility] consult for d/c planning; spoke w/ pt in room; pt says she is from home and plans to return at d/c; she identified POC dtr Rico Ala [phone]); she denies housing/food insecurity, difficulty paying utilities, and IPV; pt says she has transportation; pt says she has a walker and Rolator; she also has upper dentures and left HA; she also has shower chair and grab bars; she does not have home oxygen; pt says she has HHRN from [place] for chronic foley; Amy at [facility] notified of pt's location; no TOC needs.  Expected Discharge Plan: Home w Home Health Services Barriers to Discharge: Continued Medical Work up   Patient Goals and CMS Choice Patient states their goals for this hospitalization and ongoing recovery are:: home          Expected Discharge Plan and Services   Discharge Planning Services: CM Consult Post Acute Care Choice: Resumption of Svcs/PTA Provider [facility] services w/ [phone]) Living arrangements for the past 2 months: Apartment                                      Prior Living Arrangements/Services Living arrangements for the past 2 months: Apartment Lives with:: Self Patient language and need for interpreter reviewed:: Yes        Need for Family Participation in Patient Care: Yes (Comment) Care giver support system in place?: Yes (comment) Current home services: DME, Home RN (walker, Rolator, HH services w/ [phone]) Criminal Activity/Legal Involvement Pertinent to Current Situation/Hospitalization: No - Comment as needed  Activities of Daily Living Home Assistive Devices/Equipment: Walker (specify type), Other (Comment) (with wheels) ADL Screening (condition at  time of admission) Patient's cognitive ability adequate to safely complete daily activities?: Yes Is the patient deaf or have difficulty hearing?: Yes Does the patient have difficulty seeing, even when wearing glasses/contacts?: No Does the patient have difficulty concentrating, remembering, or making decisions?: No Patient able to express need for assistance with ADLs?: No Does the patient have difficulty dressing or bathing?: No Independently performs ADLs?: Yes (appropriate for developmental age) Does the patient have difficulty walking or climbing stairs?: No Weakness of Legs: Both Weakness of Arms/Hands: None  Permission Sought/Granted Permission sought to share information with : Case Manager Permission granted to share information with : Yes, Verbal Permission Granted  Share Information with NAME: Case Manager     Permission granted to share info w Relationship: Rico Ala [phone]     Emotional Assessment Appearance:: Appears stated age Attitude/Demeanor/Rapport: Gracious Affect (typically observed): Accepting Orientation: : Oriented to Self, Oriented to Place, Oriented to  Time, Oriented to Situation Alcohol / Substance Use: Not Applicable Psych Involvement: No (comment)  Admission diagnosis:  CHF exacerbation (HCC) [I50.9] Elevated troponin I level [R79.89] Acute congestive heart failure, unspecified heart failure type [facility]) [I50.9] Patient Active Problem List   Diagnosis Date Noted   CHF exacerbation (HCC) 08/09/2022   GI bleed 05/25/2020   Symptomatic anemia 05/25/2020   Acute renal failure (ARF) (HCC) 10/30/2016   Acute renal failure syndrome (HCC)    Dehydration  ARF (acute renal failure) (HCC) 11/19/2014   Acute gastroenteritis 11/19/2014   UTI (urinary tract infection) 11/22/2013   Hypothyroidism 11/20/2013   Acute blood loss anemia 11/20/2013   Hematochezia 11/18/2013   Warfarin-induced coagulopathy (HCC) 11/18/2013   Pacemaker-[employer]  01/04/2012   Long term (current) use of anticoagulants 10/25/2011   Tachycardia-bradycardia syndrome (HCC) 01/04/2011   Atrial fibrillation (HCC) 01/05/2010   Hyperlipidemia 07/28/2006   Essential hypertension 07/28/2006   CARDIOMYOPATHY 07/28/2006   DM type 2 (diabetes mellitus, type 2) (HCC) 05/16/2006   C V A/STROKE 05/16/2006   HEMORRHOIDS, WITH BLEEDING 05/16/2006   PCP:  Tally Joe, MD Pharmacy:   [facility] - [place], [place] - [address] [place] [address] Phone: [phone] Fax: [phone]     Social Determinants of Health (SDOH) Social History: SDOH Screenings   Food Insecurity: No Food Insecurity (08/10/2022)  Housing: Patient Declined (08/10/2022)  Transportation Needs: No Transportation Needs (08/10/2022)  Utilities: Not At Risk (08/10/2022)  Tobacco Use: Low Risk  (08/09/2022)   SDOH Interventions: Food Insecurity Interventions: Inpatient TOC Housing Interventions: Inpatient TOC Transportation Interventions: Inpatient TOC Utilities Interventions: Inpatient TOC   Readmission Risk Interventions     No data to display

## 2022-08-11 ENCOUNTER — Inpatient Hospital Stay (HOSPITAL_COMMUNITY): Payer: Medicare Other

## 2022-08-11 DIAGNOSIS — I5031 Acute diastolic (congestive) heart failure: Secondary | ICD-10-CM | POA: Diagnosis not present

## 2022-08-11 LAB — BASIC METABOLIC PANEL
Anion gap: 9 (ref 5–15)
BUN: 84 mg/dL — ABNORMAL HIGH (ref 8–23)
CO2: 23 mmol/L (ref 22–32)
Calcium: 7.8 mg/dL — ABNORMAL LOW (ref 8.9–10.3)
Chloride: 104 mmol/L (ref 98–111)
Creatinine, Ser: 2.13 mg/dL — ABNORMAL HIGH (ref 0.44–1.00)
GFR, Estimated: 22 mL/min — ABNORMAL LOW (ref >60)
Glucose, Bld: 153 mg/dL — ABNORMAL HIGH (ref 70–99)
Potassium: 3.8 mmol/L (ref 3.5–5.1)
Sodium: 136 mmol/L (ref 135–145)

## 2022-08-11 LAB — GLUCOSE, CAPILLARY
Glucose-Capillary: 143 mg/dL — ABNORMAL HIGH (ref 70–99)
Glucose-Capillary: 218 mg/dL — ABNORMAL HIGH (ref 70–99)
Glucose-Capillary: 129 mg/dL — ABNORMAL HIGH (ref 70–99)
Glucose-Capillary: 233 mg/dL — ABNORMAL HIGH (ref 70–99)

## 2022-08-11 LAB — PROTIME-INR
INR: 2.2 — ABNORMAL HIGH (ref 0.8–1.2)
Prothrombin Time: 24.5 seconds — ABNORMAL HIGH (ref 11.4–15.2)

## 2022-08-11 MED ORDER — FUROSEMIDE 40 MG PO TABS
40.0000 mg | ORAL_TABLET | Freq: Every day | ORAL | Status: DC
  Administered 2022-08-12: 40 mg via ORAL
  Filled 2022-08-11: qty 1

## 2022-08-11 MED ORDER — WARFARIN SODIUM 1 MG PO TABS
1.5000 mg | ORAL_TABLET | Freq: Once | ORAL | Status: AC
  Administered 2022-08-11: 1.5 mg via ORAL
  Filled 2022-08-11: qty 1

## 2022-08-11 NOTE — Progress Notes (Signed)
Mobility Specialist - Progress Note   08/11/22 1117  Mobility  Activity Ambulated with assistance in hallway  Level of Assistance Standby assist, set-up cues, supervision of patient - no hands on  Assistive Device Front wheel walker  Distance Ambulated (ft) 100 ft  Range of Motion/Exercises Active  Activity Response Tolerated well  Mobility Referral Yes  $Mobility charge 1 Mobility  Mobility Specialist Start Time (ACUTE ONLY) 1052  Mobility Specialist Stop Time (ACUTE ONLY) 1111  Mobility Specialist Time Calculation (min) (ACUTE ONLY) 19 min   Nurse requested Mobility Specialist to perform oxygen saturation test with pt which includes removing pt from oxygen both at rest and while ambulating.  Below are the results from that testing.     Patient Saturations on Room Air at Rest = spO2 98%  Patient Saturations on Room Air while Ambulating = sp02 89% .  Rested and performed pursed lip breathing for 1 minute with sp02 at 94%.  At end of testing pt left in room on RA  Reported results to nurse.    Pt was found on recliner chair and agreeable to ambulate. SPO2 maintained >90% for majority of ambulation. When returning to room SPO2 dropped to 89%. Within seconds she was able to increase SPO2 to 94%. At EOS was left on RA with SPO2 at 96%. Once back in room had a nose bleed and RN notified. Was left with call bell in reach and all needs met.   Billey Chang Mobility Specialist

## 2022-08-11 NOTE — Progress Notes (Signed)
ANTICOAGULATION CONSULT NOTE -   Consult  Pharmacy Consult for Warfarin Indication: atrial fibrillation  Allergies  Allergen Reactions   Sitagliptin     Other reaction(s): Unknown    Patient Measurements: Height: 5' (152.4 cm) Weight: 71.4 kg (157 lb 6.5 oz) IBW/kg (Calculated) : 45.5 Heparin Dosing Weight:   Vital Signs: Temp: 97.6 F (36.4 C) (06/14 0559) Temp Source: Oral (06/14 0559) BP: 122/58 (06/14 0559) Pulse Rate: 63 (06/14 0559)  Labs: Recent Labs    08/09/22 0023 08/09/22 0216 08/09/22 0300 08/09/22 0501 08/10/22 0523 08/10/22 0828 08/11/22 0512  HGB 8.7*  --   --  8.4*  --   --   --   HCT 28.0*  --   --  27.2*  --   --   --   PLT 132*  --   --  124*  --   --   --   LABPROT  --   --  25.4*  --   --  25.4* 24.5*  INR  --   --  2.3*  --   --  2.3* 2.2*  CREATININE 2.16*  --   --  2.24* 2.40*  --  2.13*  TROPONINIHS 81* 75*  --   --   --   --   --      Estimated Creatinine Clearance: 16.1 mL/min (A) (by C-G formula based on SCr of 2.13 mg/dL (H)).   Medical History: Past Medical History:  Diagnosis Date   Chronic kidney disease    Diverticulosis    DM2 (diabetes mellitus, type 2) (HCC)    Esophageal reflux    HTN (hypertension)    Hyperlipidemia    Hypothyroidism    Microalbuminuria    last done in 1/07    Paroxysmal atrial fibrillation (HCC)    and atrial tachycardia   Presence of permanent cardiac pacemaker    Stroke (HCC) 1999   Tachycardia-bradycardia syndrome (HCC)    s/p PPM   Unspecified hypothyroidism 11/20/2013    Medications:  Scheduled:   Chlorhexidine Gluconate Cloth  6 each Topical Daily   diltiazem  120 mg Oral Daily   furosemide  40 mg Intravenous Daily   insulin aspart  0-5 Units Subcutaneous QHS   insulin aspart  0-9 Units Subcutaneous TID WC   levothyroxine  50 mcg Oral Q0600   rosuvastatin  10 mg Oral Daily   Warfarin - Pharmacist Dosing Inpatient   Does not apply q1600   Infusions:   Assessment: 62 yoF  admitted on 6/11 with acute on chronic HF exacerbation.  PMH is significant for Afib on chronic warfarin anticoagulation and pharmacy is consulted to continue warfarin dosing.   Potential drug-drug interaction: She was taking Augmentin and Doxy PTA which may prolong INR  Home warfarin dose is 1.5 mg daily (LD 6/11 6am) with admit INR therapeutic at 2.3  Today, 08/11/22 INR 2.2, therapeutic  Hgb is low/stable at 8.4, Plt 124k ( 6/12)  No bleeding or complications noted No new interacting meds started or stopped   Goal of Therapy:  INR 2-3 Monitor platelets by anticoagulation protocol: Yes   Plan:  Warfarin 1.5 mg PO x1 dose today.  Daily PT/INR. Monitor for signs and symptoms of bleeding.    Adalberto Cole, PharmD, BCPS 08/11/2022 8:09 AM

## 2022-08-11 NOTE — Progress Notes (Signed)
PROGRESS NOTE    Rokia Quance Standiford  ZOX:[id number] DOB: 1932-02-15 DOA: 08/08/2022 PCP: Tally Joe, MD     Brief Narrative:  Lyrae Mesnard is a 89 y.o. female with medical history significant for paroxysmal A-fib on Coumadin, HFpEF, hypothyroidism, hyperlipidemia, CKD 4, who presented to [facility] ED from home due to progressive shortness of breath for the past few days.  Associated with a productive cough and audible wheezing.  Denies chest pain.  Chest x-ray revealed mild cardiogenic failure with small left pleural effusion.  Patient was started on IV Lasix and admitted to the hospital.   New events last 24 hours / Subjective: No physical complaints this morning.  Denies worsening shortness of breath  Assessment & Plan:   Principal Problem:   Acute diastolic (congestive) heart failure (HCC) Active Problems:   DM type 2 (diabetes mellitus, type 2) (HCC)   Hyperlipidemia   Essential hypertension   Atrial fibrillation (HCC)   Warfarin-induced coagulopathy (HCC)   Hypothyroidism   Pressure injury of skin   Pulmonary hypertension (HCC)   Acute diastolic CHF Pulmonary HTN  -Presented with shortness of breath, pulmonary edema with effusion -BNP 229.6 -Echocardiogram revealed EF 60 to 65%, LVH, elevated pulmonary artery systolic pressure -Continue IV Lasix --> plan to transition to p.o. tomorrow -Strict I's and O's, daily weight -Heart healthy diet, fluid restriction -Repeat chest x-ray reviewed independently, improvement in edema   Acute hypoxemic respiratory failure -Dropped down to 88% on room air in the emergency department.  Currently on 2 L nasal cannula O2 -wean   Demand ischemia -Insetting of CHF exacerbation -Troponin 81, 75 -Echocardiogram did not reveal regional wall motion abnormalities   Paroxysmal A-fib on Coumadin -Coumadin dosing per pharmacy -Diltiazem   Diabetes mellitus -A1c 6.0 -Sliding scale insulin   CKD stage IV -Baseline creatinine  2.0 -Continue to monitor while on Lasix, stable   Hyperlipidemia -Crestor   Hypothyroidism -Synthroid     In agreement with assessment of the pressure ulcer as below:  Pressure Injury 08/09/22 Sacrum Mid Stage 1 -  Intact skin with non-blanchable redness of a localized area usually over a bony prominence. pink healing area (Active)  08/09/22 1600  Location: Sacrum  Location Orientation: Mid  Staging: Stage 1 -  Intact skin with non-blanchable redness of a localized area usually over a bony prominence.  Wound Description (Comments): pink healing area  Present on Admission: Yes  Dressing Type Foam - Lift dressing to assess site every shift 08/11/22 0831         DVT prophylaxis:  warfarin (COUMADIN) tablet 1.5 mg  Code Status: DNR Family Communication: Updated daughter over the phone today Disposition Plan: Home Status is: Inpatient Remains inpatient appropriate because: IV Lasix    Antimicrobials:  Anti-infectives (From admission, onward)    None        Objective: Vitals:   08/10/22 0439 08/10/22 1211 08/10/22 2141 08/11/22 0559  BP:  (!) 131/39 (!) 126/98 (!) 122/58  Pulse:  60 62 63  Resp:  18 20 16   Temp:   97.8 F (36.6 C) 97.6 F (36.4 C)  TempSrc:   Oral Oral  SpO2:  100% 100% 100%  Weight: 72.4 kg   71.4 kg  Height:        Intake/Output Summary (Last 24 hours) at 08/11/2022 1251 Last data filed at 08/11/2022 1000 Gross per 24 hour  Intake 820 ml  Output 1650 ml  Net -830 ml    Filed Weights   08/09/22  1553 08/10/22 0439 08/11/22 0559  Weight: 70.8 kg 72.4 kg 71.4 kg    Examination:  General exam: Appears calm and comfortable  Respiratory system: Clear to auscultation. Respiratory effort normal. No respiratory distress. No conversational dyspnea.   Cardiovascular system: S1 & S2 heard. No murmurs.  No pedal edema. Gastrointestinal system: Abdomen is nondistended, soft and nontender. Normal bowel sounds heard. Central nervous system:  Alert and oriented. No focal neurological deficits. Speech clear.  Extremities: Symmetric in appearance  Skin: No rashes, lesions or ulcers on exposed skin  Psychiatry: Judgement and insight appear normal. Mood & affect appropriate.   Data Reviewed: I have personally reviewed following labs and imaging studies  CBC: Recent Labs  Lab 08/09/22 0023 08/09/22 0501  WBC 7.4 5.7  NEUTROABS 5.6  --   HGB 8.7* 8.4*  HCT 28.0* 27.2*  MCV 92.1 92.5  PLT 132* 124*    Basic Metabolic Panel: Recent Labs  Lab 08/09/22 0023 08/09/22 0501 08/10/22 0523 08/11/22 0512  NA 136 134* 136 136  K 4.5 4.2 4.2 3.8  CL 107 104 104 104  CO2 19* 17* 21* 23  GLUCOSE 291* 343* 212* 153*  BUN 58* 57* 71* 84*  CREATININE 2.16* 2.24* 2.40* 2.13*  CALCIUM 8.3* 8.2* 8.2* 7.8*  MG  --  2.1  --   --   PHOS  --  4.4  --   --     GFR: Estimated Creatinine Clearance: 16.1 mL/min (A) (by C-G formula based on SCr of 2.13 mg/dL (H)). Liver Function Tests: No results for input(s): "AST", "ALT", "ALKPHOS", "BILITOT", "PROT", "ALBUMIN" in the last 168 hours. No results for input(s): "LIPASE", "AMYLASE" in the last 168 hours. No results for input(s): "AMMONIA" in the last 168 hours. Coagulation Profile: Recent Labs  Lab 08/09/22 0300 08/10/22 0828 08/11/22 0512  INR 2.3* 2.3* 2.2*    Cardiac Enzymes: No results for input(s): "CKTOTAL", "CKMB", "CKMBINDEX", "TROPONINI" in the last 168 hours. BNP (last 3 results) No results for input(s): "PROBNP" in the last 8760 hours. HbA1C: Recent Labs    08/09/22 0501  HGBA1C 6.0*    CBG: Recent Labs  Lab 08/10/22 1138 08/10/22 1613 08/10/22 2144 08/11/22 0736 08/11/22 1138  GLUCAP 203* 101* 166* 143* 218*    Lipid Profile: No results for input(s): "CHOL", "HDL", "LDLCALC", "TRIG", "CHOLHDL", "LDLDIRECT" in the last 72 hours. Thyroid Function Tests: No results for input(s): "TSH", "T4TOTAL", "FREET4", "T3FREE", "THYROIDAB" in the last 72  hours. Anemia Panel: No results for input(s): "VITAMINB12", "FOLATE", "FERRITIN", "TIBC", "IRON", "RETICCTPCT" in the last 72 hours. Sepsis Labs: Recent Labs  Lab 08/09/22 0501  PROCALCITON <0.10     Recent Results (from the past 240 hour(s))  Resp panel by RT-PCR (RSV, Flu A&B, Covid) Anterior Nasal Swab     Status: None   Collection Time: 08/09/22 12:07 AM   Specimen: Anterior Nasal Swab  Result Value Ref Range Status   SARS Coronavirus 2 by RT PCR NEGATIVE NEGATIVE Final    Comment: (NOTE) SARS-CoV-2 target nucleic acids are NOT DETECTED.  The SARS-CoV-2 RNA is generally detectable in upper respiratory specimens during the acute phase of infection. The lowest concentration of SARS-CoV-2 viral copies this assay can detect is 138 copies/mL. A negative result does not preclude SARS-Cov-2 infection and should not be used as the sole basis for treatment or other patient management decisions. A negative result may occur with  improper specimen collection/handling, submission of specimen other than nasopharyngeal swab, presence of viral mutation(s)  within the areas targeted by this assay, and inadequate number of viral copies(<138 copies/mL). A negative result must be combined with clinical observations, patient history, and epidemiological information. The expected result is Negative.  Fact Sheet for Patients:  BloggerCourse.com  Fact Sheet for Healthcare Providers:  SeriousBroker.it  This test is no t yet approved or cleared by the [place] FDA and  has been authorized for detection and/or diagnosis of SARS-CoV-2 by FDA under an Emergency Use Authorization (EUA). This EUA will remain  in effect (meaning this test can be used) for the duration of the COVID-19 declaration under Section 564(b)(1) of the Act, 21 U.S.C.section 360bbb-3(b)(1), unless the authorization is terminated  or revoked sooner.       Influenza A by  PCR NEGATIVE NEGATIVE Final   Influenza B by PCR NEGATIVE NEGATIVE Final    Comment: (NOTE) The Xpert Xpress SARS-CoV-2/FLU/RSV plus assay is intended as an aid in the diagnosis of influenza from Nasopharyngeal swab specimens and should not be used as a sole basis for treatment. Nasal washings and aspirates are unacceptable for Xpert Xpress SARS-CoV-2/FLU/RSV testing.  Fact Sheet for Patients: BloggerCourse.com  Fact Sheet for Healthcare Providers: SeriousBroker.it  This test is not yet approved or cleared by the [place] FDA and has been authorized for detection and/or diagnosis of SARS-CoV-2 by FDA under an Emergency Use Authorization (EUA). This EUA will remain in effect (meaning this test can be used) for the duration of the COVID-19 declaration under Section 564(b)(1) of the Act, 21 U.S.C. section 360bbb-3(b)(1), unless the authorization is terminated or revoked.     Resp Syncytial Virus by PCR NEGATIVE NEGATIVE Final    Comment: (NOTE) Fact Sheet for Patients: BloggerCourse.com  Fact Sheet for Healthcare Providers: SeriousBroker.it  This test is not yet approved or cleared by the [place] FDA and has been authorized for detection and/or diagnosis of SARS-CoV-2 by FDA under an Emergency Use Authorization (EUA). This EUA will remain in effect (meaning this test can be used) for the duration of the COVID-19 declaration under Section 564(b)(1) of the Act, 21 U.S.C. section 360bbb-3(b)(1), unless the authorization is terminated or revoked.  Performed at [facility], [address], [place], [place] [address]       Radiology Studies: DG CHEST PORT 1 VIEW  Result Date: 08/11/2022 CLINICAL DATA:  89 year old female with pulmonary edema. EXAM: PORTABLE CHEST 1 VIEW COMPARISON:  Portable chest 08/09/2022 and earlier. FINDINGS: Portable AP semi  upright view at 0418 hours. Stable cardiomegaly and mediastinal contours. Stable right chest pacemaker. Mildly improved lung volumes. No overt edema. No pneumothorax or consolidation. Small pleural effusions difficult to exclude, but otherwise no confluent lung opacity. Paucity of bowel gas in the visible abdomen. No acute osseous abnormality identified. IMPRESSION: Stable cardiomegaly. No overt pulmonary edema. Small pleural effusions difficult to exclude. Electronically Signed   By: Odessa Fleming M.D.   On: 08/11/2022 08:14   ECHOCARDIOGRAM COMPLETE  Result Date: 08/09/2022    ECHOCARDIOGRAM REPORT   Patient Name:   BELISA KASSAM [facility] Date of Exam: 08/09/2022 Medical Rec #:  [id number]              Height:       62.5 in Accession #:    [id number]             Weight:       150.0 lb Date of Birth:  01-12-1932             BSA:  1.702 m Patient Age:    89 years               BP:           139/50 mmHg Patient Gender: F                      HR:           62 bpm. Exam Location:  Inpatient Procedure: 2D Echo, Cardiac Doppler and Color Doppler Indications:    CHF-Acute Diastolic I50.31  History:        Patient has no prior history of Echocardiogram examinations.                 Cardiomyopathy and CHF, Pacemaker, Stroke, Arrythmias:Atrial                 Fibrillation and Tachycardia; Risk Factors:Hypertension,                 Diabetes and Dyslipidemia.  Sonographer:    Lucendia Herrlich Referring Phys: [id number] CAROLE N HALL IMPRESSIONS  1. Left ventricular ejection fraction, by estimation, is 60 to 65%. The left ventricle has normal function. The left ventricle has no regional wall motion abnormalities. There is severe asymmetric left ventricular hypertrophy of the inferior segment. Left ventricular diastolic parameters are indeterminate.  2. Right ventricular systolic function is normal. The right ventricular size is normal. There is severely elevated pulmonary artery systolic pressure. The estimated right  ventricular systolic pressure is 85.6 mmHg.  3. Left atrial size was severely dilated.  4. Right atrial size was severely dilated.  5. 2D Mitral valve area 2.77 cm2; Caseous mitral annular calcification noted. The mitral valve is degenerative. Moderate mitral valve regurgitation. Mild to moderate mitral stenosis. The mean mitral valve gradient is 5.0 mmHg.  6. Tricuspid valve regurgitation is moderate to severe.  7. The aortic valve is tricuspid. There is mild calcification of the aortic valve. There is mild thickening of the aortic valve. Aortic valve regurgitation is moderate. Moderate aortic valve stenosis. Aortic regurgitation PHT measures 350 msec. Aortic valve mean gradient measures 26.0 mmHg.  8. The inferior vena cava is dilated in size with <50% respiratory variability, suggesting right atrial pressure of 15 mmHg. Comparison(s): No prior Echocardiogram. FINDINGS  Left Ventricle: Basal inferior hypertrophy 28 mm at largest. Left ventricular ejection fraction, by estimation, is 60 to 65%. The left ventricle has normal function. The left ventricle has no regional wall motion abnormalities. The left ventricular internal cavity size was normal in size. There is severe asymmetric left ventricular hypertrophy of the inferior segment. Left ventricular diastolic parameters are indeterminate. Right Ventricle: The right ventricular size is normal. No increase in right ventricular wall thickness. Right ventricular systolic function is normal. There is severely elevated pulmonary artery systolic pressure. The tricuspid regurgitant velocity is 4.20 m/s, and with an assumed right atrial pressure of 15 mmHg, the estimated right ventricular systolic pressure is 85.6 mmHg. Left Atrium: Left atrial size was severely dilated. Right Atrium: Right atrial size was severely dilated. Pericardium: There is no evidence of pericardial effusion. Mitral Valve: 2D Mitral valve area 2.77 cm2; Caseous mitral annular calcification noted.  The mitral valve is degenerative in appearance. Moderate mitral valve regurgitation. Mild to moderate mitral valve stenosis. MV peak gradient, 14.2 mmHg. The mean mitral valve gradient is 5.0 mmHg with average heart rate of 61 bpm. Tricuspid Valve: The tricuspid valve is normal in structure. Tricuspid valve regurgitation is moderate to severe.  No evidence of tricuspid stenosis. Aortic Valve: The aortic valve is tricuspid. There is mild calcification of the aortic valve. There is mild thickening of the aortic valve. There is mild aortic valve annular calcification. Aortic valve regurgitation is moderate. Aortic regurgitation PHT  measures 350 msec. Moderate aortic stenosis is present. Aortic valve mean gradient measures 26.0 mmHg. Aortic valve peak gradient measures 45.8 mmHg. Aortic valve area, by VTI measures 1.00 cm. Pulmonic Valve: Eccentric jet. The pulmonic valve was normal in structure. Pulmonic valve regurgitation is mild to moderate. No evidence of pulmonic stenosis. Aorta: The aortic root and ascending aorta are structurally normal, with no evidence of dilitation. Venous: The inferior vena cava is dilated in size with less than 50% respiratory variability, suggesting right atrial pressure of 15 mmHg. IAS/Shunts: No atrial level shunt detected by color flow Doppler. Additional Comments: A device lead is visualized in the right ventricle and right atrium.  LEFT VENTRICLE PLAX 2D LVIDd:         3.90 cm   Diastology LVIDs:         2.80 cm   LV e' medial:    4.95 cm/s LV PW:         2.00 cm   LV E/e' medial:  32.0 LV IVS:        1.10 cm   LV e' lateral:   13.80 cm/s LVOT diam:     2.00 cm   LV E/e' lateral: 11.5 LV SV:         82 LV SV Index:   48 LVOT Area:     3.14 cm  RIGHT VENTRICLE             IVC RV S prime:     10.50 cm/s  IVC diam: 2.50 cm TAPSE (M-mode): 1.4 cm LEFT ATRIUM             Index        RIGHT ATRIUM           Index LA diam:        5.20 cm 3.06 cm/m   RA Area:     35.30 cm LA Vol (A2C):    86.3 ml 50.71 ml/m  RA Volume:   137.00 ml 80.50 ml/m LA Vol (A4C):   93.4 ml 54.88 ml/m LA Biplane Vol: 97.2 ml 57.12 ml/m  AORTIC VALVE AV Area (Vmax):    1.00 cm AV Area (Vmean):   1.00 cm AV Area (VTI):     1.00 cm AV Vmax:           338.50 cm/s AV Vmean:          228.500 cm/s AV VTI:            0.818 m AV Peak Grad:      45.8 mmHg AV Mean Grad:      26.0 mmHg LVOT Vmax:         107.67 cm/s LVOT Vmean:        72.733 cm/s LVOT VTI:          0.261 m LVOT/AV VTI ratio: 0.32 AI PHT:            350 msec  AORTA Ao Root diam: 3.40 cm Ao Asc diam:  3.50 cm MITRAL VALVE                TRICUSPID VALVE MV Area (PHT): 6.96 cm     TR Peak grad:   70.6 mmHg MV Area VTI:  2.20 cm     TR Vmax:        420.00 cm/s MV Peak grad:  14.2 mmHg MV Mean grad:  5.0 mmHg     SHUNTS MV Vmax:       1.88 m/s     Systemic VTI:  0.26 m MV Vmean:      84.2 cm/s    Systemic Diam: 2.00 cm MV Decel Time: 109 msec MR Peak grad: 120.1 mmHg MR Vmax:      548.00 cm/s MV E velocity: 158.50 cm/s MV A velocity: 82.50 cm/s MV E/A ratio:  1.92 Riley Lam MD Electronically signed by Riley Lam MD Signature Date/Time: 08/09/2022/4:32:29 PM    Final       Scheduled Meds:  Chlorhexidine Gluconate Cloth  6 each Topical Daily   diltiazem  120 mg Oral Daily   furosemide  40 mg Intravenous Daily   insulin aspart  0-5 Units Subcutaneous QHS   insulin aspart  0-9 Units Subcutaneous TID WC   levothyroxine  50 mcg Oral Q0600   rosuvastatin  10 mg Oral Daily   warfarin  1.5 mg Oral ONCE-1600   Warfarin - Pharmacist Dosing Inpatient   Does not apply q1600   Continuous Infusions:   LOS: 2 days   Time spent: 25 minutes   Noralee Stain, DO [facility] 08/11/2022, 12:51 PM   Available via Epic secure chat 7am-7pm After these hours, please refer to coverage provider listed on [email or web address]

## 2022-08-12 DIAGNOSIS — I5031 Acute diastolic (congestive) heart failure: Secondary | ICD-10-CM | POA: Diagnosis not present

## 2022-08-12 LAB — BASIC METABOLIC PANEL
Anion gap: 10 (ref 5–15)
BUN: 80 mg/dL — ABNORMAL HIGH (ref 8–23)
CO2: 23 mmol/L (ref 22–32)
Calcium: 8 mg/dL — ABNORMAL LOW (ref 8.9–10.3)
Chloride: 104 mmol/L (ref 98–111)
Creatinine, Ser: 2.08 mg/dL — ABNORMAL HIGH (ref 0.44–1.00)
GFR, Estimated: 23 mL/min — ABNORMAL LOW (ref >60)
Glucose, Bld: 139 mg/dL — ABNORMAL HIGH (ref 70–99)
Potassium: 3.7 mmol/L (ref 3.5–5.1)
Sodium: 137 mmol/L (ref 135–145)

## 2022-08-12 LAB — PROTIME-INR
INR: 2.2 — ABNORMAL HIGH (ref 0.8–1.2)
Prothrombin Time: 24.5 seconds — ABNORMAL HIGH (ref 11.4–15.2)

## 2022-08-12 LAB — GLUCOSE, CAPILLARY
Glucose-Capillary: 134 mg/dL — ABNORMAL HIGH (ref 70–99)
Glucose-Capillary: 231 mg/dL — ABNORMAL HIGH (ref 70–99)

## 2022-08-12 MED ORDER — FUROSEMIDE 40 MG PO TABS: 40.0000 mg | ORAL_TABLET | Freq: Every morning | ORAL | 1 refills | Status: DC

## 2022-08-12 MED ORDER — ZINC OXIDE 40 % EX OINT: TOPICAL_OINTMENT | CUTANEOUS | 0 refills | Status: DC | PRN

## 2022-08-12 MED ORDER — ZINC OXIDE 40 % EX OINT: TOPICAL_OINTMENT | CUTANEOUS | 0 refills | Status: AC | PRN

## 2022-08-12 MED ORDER — SALINE SPRAY 0.65 % NA SOLN
1.0000 | NASAL | Status: DC | PRN
  Filled 2022-08-12: qty 44

## 2022-08-12 MED ORDER — WARFARIN SODIUM 1 MG PO TABS
1.5000 mg | ORAL_TABLET | Freq: Once | ORAL | Status: DC
  Filled 2022-08-12: qty 1

## 2022-08-12 NOTE — Progress Notes (Signed)
ANTICOAGULATION CONSULT NOTE -   Consult  Pharmacy Consult for Warfarin Indication: atrial fibrillation  Allergies  Allergen Reactions   Sitagliptin     Other reaction(s): Unknown    Patient Measurements: Height: 5' (152.4 cm) Weight: 71.9 kg (158 lb 8.2 oz) IBW/kg (Calculated) : 45.5 Heparin Dosing Weight:   Vital Signs: Temp: 98.2 F (36.8 C) (06/15 0436) Temp Source: Oral (06/15 0436) BP: 116/65 (06/15 0436) Pulse Rate: 60 (06/15 0436)  Labs: Recent Labs    08/10/22 0523 08/10/22 0828 08/11/22 0512 08/12/22 0535  LABPROT  --  25.4* 24.5* 24.5*  INR  --  2.3* 2.2* 2.2*  CREATININE 2.40*  --  2.13* 2.08*     Estimated Creatinine Clearance: 16.6 mL/min (A) (by C-G formula based on SCr of 2.08 mg/dL (H)).   Medical History: Past Medical History:  Diagnosis Date   Chronic kidney disease    Diverticulosis    DM2 (diabetes mellitus, type 2) (HCC)    Esophageal reflux    HTN (hypertension)    Hyperlipidemia    Hypothyroidism    Microalbuminuria    last done in 1/07    Paroxysmal atrial fibrillation (HCC)    and atrial tachycardia   Presence of permanent cardiac pacemaker    Stroke (HCC) 1999   Tachycardia-bradycardia syndrome (HCC)    s/p PPM   Unspecified hypothyroidism 11/20/2013    Medications:  Scheduled:   Chlorhexidine Gluconate Cloth  6 each Topical Daily   diltiazem  120 mg Oral Daily   furosemide  40 mg Oral Daily   insulin aspart  0-5 Units Subcutaneous QHS   insulin aspart  0-9 Units Subcutaneous TID WC   levothyroxine  50 mcg Oral Q0600   rosuvastatin  10 mg Oral Daily   Warfarin - Pharmacist Dosing Inpatient   Does not apply q1600   Infusions:   Assessment: 80 yoF admitted on 6/11 with acute on chronic HF exacerbation.  PMH is significant for Afib on chronic warfarin anticoagulation and pharmacy is consulted to continue warfarin dosing.   Potential drug-drug interaction: She was taking Augmentin and Doxy PTA which may prolong  INR  Home warfarin dose is 1.5 mg daily (LD 6/11 6am) with admit INR therapeutic at 2.3  Today, 08/12/22 INR 2.2, therapeutic  Hgb is low/stable at 8.4, Plt 124k ( 6/12)  No bleeding or complications noted No new interacting meds started or stopped   Goal of Therapy:  INR 2-3 Monitor platelets by anticoagulation protocol: Yes   Plan:  Warfarin 1.5 mg PO x1 dose today.  Daily PT/INR. Monitor for signs and symptoms of bleeding.    Adalberto Cole, PharmD, BCPS 08/12/2022 9:01 AM

## 2022-08-12 NOTE — Discharge Summary (Signed)
Physician Discharge Summary  Tiffany [name]:[id number] DOB: 1931-08-01 DOA: 08/08/2022  PCP: Tally Joe, MD  Admit date: 08/08/2022 Discharge date: 08/12/2022  Admitted From: Home Disposition:  Home with home health   Recommendations for Outpatient Follow-up:  Follow up with PCP in 1 week  Discharge Condition: Stable CODE STATUS: DNR  Diet recommendation: Heart healthy diet, fluid restriction   Brief/Interim Summary: Tiffany [name] is a 89 y.o. female with medical history significant for paroxysmal A-fib on Coumadin, HFpEF, hypothyroidism, hyperlipidemia, CKD 4, who presented to [facility] ED from home due to progressive shortness of breath for the past few days.  Associated with a productive cough and audible wheezing.  Denies chest pain.  Chest x-ray revealed mild cardiogenic failure with small left pleural effusion.  Patient was started on IV Lasix and admitted to the hospital.   Patient diuresed well with IV Lasix, shortness of breath improved.  IV Lasix was transition to p.o. prior to discharge.  Repeat chest x-ray showed improvement in edema.  Discussed fluid restriction diet with daughter as well as keeping an eye on urine output.  Patient does have chronic Foley from prior to admission.  Discharge Diagnoses:   Principal Problem:   Acute diastolic (congestive) heart failure (HCC) Active Problems:   DM type 2 (diabetes mellitus, type 2) (HCC)   Hyperlipidemia   Essential hypertension   Atrial fibrillation (HCC)   Warfarin-induced coagulopathy (HCC)   Hypothyroidism   Pressure injury of skin   Pulmonary hypertension (HCC)    Acute diastolic CHF Pulmonary HTN  -Presented with shortness of breath, pulmonary edema with effusion -BNP 229.6 -Echocardiogram revealed EF 60 to 65%, LVH, elevated pulmonary artery systolic pressure -Strict I's and O's, daily weight -Heart healthy diet, fluid restriction -Repeat chest x-ray reviewed independently, improvement in  edema -IV Lasix --> PO lasix 40mg  daily    Acute hypoxemic respiratory failure -Dropped down to 88% on room air in the emergency department.  Weaned to room air   Demand ischemia -Insetting of CHF exacerbation -Troponin 81, 75 -Echocardiogram did not reveal regional wall motion abnormalities   Paroxysmal A-fib on Coumadin -Coumadin dosing per pharmacy -Diltiazem   Diabetes mellitus -A1c 6.0 -Sliding scale insulin   CKD stage IV -Baseline creatinine 2.0 -Continue to monitor while on Lasix, stable   Hyperlipidemia -Crestor   Hypothyroidism -Synthroid     In agreement with assessment of the pressure ulcer as below:  Pressure Injury 08/09/22 Sacrum Mid Stage 1 -  Intact skin with non-blanchable redness of a localized area usually over a bony prominence. pink healing area (Active)  08/09/22 1600  Location: Sacrum  Location Orientation: Mid  Staging: Stage 1 -  Intact skin with non-blanchable redness of a localized area usually over a bony prominence.  Wound Description (Comments): pink healing area  Present on Admission: Yes  Dressing Type None 08/11/22 2000       Discharge Instructions  Discharge Instructions     (HEART FAILURE PATIENTS) Call MD:  Anytime you have any of the following symptoms: 1) 3 pound weight gain in 24 hours or 5 pounds in 1 week 2) shortness of breath, with or without a dry hacking cough 3) swelling in the hands, feet or stomach 4) if you have to sleep on extra pillows at night in order to breathe.   Complete by: As directed    Call MD for:  difficulty breathing, headache or visual disturbances   Complete by: As directed    Call MD for:  extreme fatigue   Complete by: As directed    Call MD for:  persistant dizziness or light-headedness   Complete by: As directed    Call MD for:  persistant nausea and vomiting   Complete by: As directed    Call MD for:  severe uncontrolled pain   Complete by: As directed    Call MD for:  temperature >100.4    Complete by: As directed    Diet - low sodium heart healthy   Complete by: As directed    Fluid restriction diet < (or 2L)   Discharge instructions   Complete by: As directed    You were cared for by a hospitalist during your hospital stay. If you have any questions about your discharge medications or the care you received while you were in the hospital after you are discharged, you can call the unit and ask to speak with the hospitalist on call if the hospitalist that took care of you is not available. Once you are discharged, your primary care physician will handle any further medical issues. Please note that NO REFILLS for any discharge medications will be authorized once you are discharged, as it is imperative that you return to your primary care physician (or establish a relationship with a primary care physician if you do not have one) for your aftercare needs so that they can reassess your need for medications and monitor your lab values.   Increase activity slowly   Complete by: As directed    No wound care   Complete by: As directed       Allergies as of 08/12/2022       Reactions   Sitagliptin    Other reaction(s): Unknown        Medication List     STOP taking these medications    triamcinolone cream 0.1 % Commonly known as: KENALOG       TAKE these medications    acetaminophen 650 MG CR tablet Commonly known as: TYLENOL Take 650 mg by mouth every 8 (eight) hours as needed for pain.   Calcium Citrate 250 MG Tabs Take 250 mg by mouth every morning.   cetirizine 10 MG tablet Commonly known as: ZYRTEC Take 10 mg by mouth daily.   diltiazem 120 MG 24 hr capsule Commonly known as: TIAZAC Take 120 mg by mouth daily.   Fergon 240 (27 FE) MG tablet Generic drug: ferrous gluconate Take 240 mg by mouth in the morning and at bedtime.   furosemide 40 MG tablet Commonly known as: LASIX Take 1 tablet (40 mg total) by mouth every morning. What changed:   medication strength how much to take   glipiZIDE 5 MG tablet Commonly known as: GLUCOTROL Take 5 mg by mouth daily before breakfast.   levothyroxine 50 MCG tablet Commonly known as: SYNTHROID Take 50 mcg by mouth daily before breakfast.   liver oil-zinc oxide 40 % ointment Commonly known as: DESITIN Apply topically as needed for irritation.   melatonin 3 MG Tabs tablet Take 3 mg by mouth at bedtime.   multivitamin with minerals tablet Take 1 tablet by mouth daily.   Ozempic (1 MG/DOSE) 4 MG/3ML Sopn Generic drug: Semaglutide (1 MG/DOSE) Inject 1 mg into the skin once a week.   potassium chloride SA 20 MEQ tablet Commonly known as: KLOR-CON M Take 20 mEq by mouth every morning.   rosuvastatin 10 MG tablet Commonly known as: CRESTOR Take 10 mg by mouth daily.   warfarin  1 MG tablet Commonly known as: COUMADIN Take 1.5 mg by mouth daily.        Follow-up Information     Tally Joe, MD Follow up.   Specialty: Family Medicine Contact information: [phone] [address] [place] [address] [phone]                Allergies  Allergen Reactions   Sitagliptin     Other reaction(s): Unknown    Consultations: None    Procedures/Studies: DG CHEST PORT 1 VIEW  Result Date: 08/11/2022 CLINICAL DATA:  89 year old female with pulmonary edema. EXAM: PORTABLE CHEST 1 VIEW COMPARISON:  Portable chest 08/09/2022 and earlier. FINDINGS: Portable AP semi upright view at 0418 hours. Stable cardiomegaly and mediastinal contours. Stable right chest pacemaker. Mildly improved lung volumes. No overt edema. No pneumothorax or consolidation. Small pleural effusions difficult to exclude, but otherwise no confluent lung opacity. Paucity of bowel gas in the visible abdomen. No acute osseous abnormality identified. IMPRESSION: Stable cardiomegaly. No overt pulmonary edema. Small pleural effusions difficult to exclude. Electronically Signed   By: Odessa Fleming M.D.    On: 08/11/2022 08:14   ECHOCARDIOGRAM COMPLETE  Result Date: 08/09/2022    ECHOCARDIOGRAM REPORT   Patient Name:   Tiffany [name] [facility] Date of Exam: 08/09/2022 Medical Rec #:  [id number]              Height:       62.5 in Accession #:    [id number]             Weight:       150.0 lb Date of Birth:  [birth year]             BSA:          1.702 m Patient Age:    89 years               BP:           139/50 mmHg Patient Gender: F                      HR:           62 bpm. Exam Location:  Inpatient Procedure: 2D Echo, Cardiac Doppler and Color Doppler Indications:    CHF-Acute Diastolic I50.31  History:        Patient has no prior history of Echocardiogram examinations.                 Cardiomyopathy and CHF, Pacemaker, Stroke, Arrythmias:Atrial                 Fibrillation and Tachycardia; Risk Factors:Hypertension,                 Diabetes and Dyslipidemia.  Sonographer:    Lucendia Herrlich Referring Phys: [id number] CAROLE N HALL IMPRESSIONS  1. Left ventricular ejection fraction, by estimation, is 60 to 65%. The left ventricle has normal function. The left ventricle has no regional wall motion abnormalities. There is severe asymmetric left ventricular hypertrophy of the inferior segment. Left ventricular diastolic parameters are indeterminate.  2. Right ventricular systolic function is normal. The right ventricular size is normal. There is severely elevated pulmonary artery systolic pressure. The estimated right ventricular systolic pressure is 85.6 mmHg.  3. Left atrial size was severely dilated.  4. Right atrial size was severely dilated.  5. 2D Mitral valve area 2.77 cm2; Caseous mitral annular calcification noted. The mitral valve is degenerative. Moderate mitral  valve regurgitation. Mild to moderate mitral stenosis. The mean mitral valve gradient is 5.0 mmHg.  6. Tricuspid valve regurgitation is moderate to severe.  7. The aortic valve is tricuspid. There is mild calcification of the aortic valve. There is mild  thickening of the aortic valve. Aortic valve regurgitation is moderate. Moderate aortic valve stenosis. Aortic regurgitation PHT measures 350 msec. Aortic valve mean gradient measures 26.0 mmHg.  8. The inferior vena cava is dilated in size with <50% respiratory variability, suggesting right atrial pressure of 15 mmHg. Comparison(s): No prior Echocardiogram. FINDINGS  Left Ventricle: Basal inferior hypertrophy 28 mm at largest. Left ventricular ejection fraction, by estimation, is 60 to 65%. The left ventricle has normal function. The left ventricle has no regional wall motion abnormalities. The left ventricular internal cavity size was normal in size. There is severe asymmetric left ventricular hypertrophy of the inferior segment. Left ventricular diastolic parameters are indeterminate. Right Ventricle: The right ventricular size is normal. No increase in right ventricular wall thickness. Right ventricular systolic function is normal. There is severely elevated pulmonary artery systolic pressure. The tricuspid regurgitant velocity is 4.20 m/s, and with an assumed right atrial pressure of 15 mmHg, the estimated right ventricular systolic pressure is 85.6 mmHg. Left Atrium: Left atrial size was severely dilated. Right Atrium: Right atrial size was severely dilated. Pericardium: There is no evidence of pericardial effusion. Mitral Valve: 2D Mitral valve area 2.77 cm2; Caseous mitral annular calcification noted. The mitral valve is degenerative in appearance. Moderate mitral valve regurgitation. Mild to moderate mitral valve stenosis. MV peak gradient, 14.2 mmHg. The mean mitral valve gradient is 5.0 mmHg with average heart rate of 61 bpm. Tricuspid Valve: The tricuspid valve is normal in structure. Tricuspid valve regurgitation is moderate to severe. No evidence of tricuspid stenosis. Aortic Valve: The aortic valve is tricuspid. There is mild calcification of the aortic valve. There is mild thickening of the aortic  valve. There is mild aortic valve annular calcification. Aortic valve regurgitation is moderate. Aortic regurgitation PHT  measures 350 msec. Moderate aortic stenosis is present. Aortic valve mean gradient measures 26.0 mmHg. Aortic valve peak gradient measures 45.8 mmHg. Aortic valve area, by VTI measures 1.00 cm. Pulmonic Valve: Eccentric jet. The pulmonic valve was normal in structure. Pulmonic valve regurgitation is mild to moderate. No evidence of pulmonic stenosis. Aorta: The aortic root and ascending aorta are structurally normal, with no evidence of dilitation. Venous: The inferior vena cava is dilated in size with less than 50% respiratory variability, suggesting right atrial pressure of 15 mmHg. IAS/Shunts: No atrial level shunt detected by color flow Doppler. Additional Comments: A device lead is visualized in the right ventricle and right atrium.  LEFT VENTRICLE PLAX 2D LVIDd:         3.90 cm   Diastology LVIDs:         2.80 cm   LV e' medial:    4.95 cm/s LV PW:         2.00 cm   LV E/e' medial:  32.0 LV IVS:        1.10 cm   LV e' lateral:   13.80 cm/s LVOT diam:     2.00 cm   LV E/e' lateral: 11.5 LV SV:         82 LV SV Index:   48 LVOT Area:     3.14 cm  RIGHT VENTRICLE             IVC RV S prime:  10.50 cm/s  IVC diam: 2.50 cm TAPSE (M-mode): 1.4 cm LEFT ATRIUM             Index        RIGHT ATRIUM           Index LA diam:        5.20 cm 3.06 cm/m   RA Area:     35.30 cm LA Vol (A2C):   86.3 ml 50.71 ml/m  RA Volume:   137.00 ml 80.50 ml/m LA Vol (A4C):   93.4 ml 54.88 ml/m LA Biplane Vol: 97.2 ml 57.12 ml/m  AORTIC VALVE AV Area (Vmax):    1.00 cm AV Area (Vmean):   1.00 cm AV Area (VTI):     1.00 cm AV Vmax:           338.50 cm/s AV Vmean:          228.500 cm/s AV VTI:            0.818 m AV Peak Grad:      45.8 mmHg AV Mean Grad:      26.0 mmHg LVOT Vmax:         107.67 cm/s LVOT Vmean:        72.733 cm/s LVOT VTI:          0.261 m LVOT/AV VTI ratio: 0.32 AI PHT:            350 msec   AORTA Ao Root diam: 3.40 cm Ao Asc diam:  3.50 cm MITRAL VALVE                TRICUSPID VALVE MV Area (PHT): 6.96 cm     TR Peak grad:   70.6 mmHg MV Area VTI:   2.20 cm     TR Vmax:        420.00 cm/s MV Peak grad:  14.2 mmHg MV Mean grad:  5.0 mmHg     SHUNTS MV Vmax:       1.88 m/s     Systemic VTI:  0.26 m MV Vmean:      84.2 cm/s    Systemic Diam: 2.00 cm MV Decel Time: 109 msec MR Peak grad: 120.1 mmHg MR Vmax:      548.00 cm/s MV E velocity: 158.50 cm/s MV A velocity: 82.50 cm/s MV E/A ratio:  1.92 Riley Lam MD Electronically signed by Riley Lam MD Signature Date/Time: 08/09/2022/4:32:29 PM    Final    DG Chest Portable 1 View  Result Date: 08/09/2022 CLINICAL DATA:  Cough EXAM: PORTABLE CHEST 1 VIEW COMPARISON:  10/25/2016 FINDINGS: Lungs are clear. No pneumothorax. Small left pleural effusion has developed. Right subclavian dual lead pacemaker is unchanged. Cardiomegaly is stable when accounting for patient positioning. Mild bilateral perihilar interstitial pulmonary infiltrate has developed most in keeping with mild cardiogenic failure. No acute bone abnormality. IMPRESSION: 1. Mild cardiogenic failure with small left pleural effusion. Electronically Signed   By: Helyn Numbers M.D.   On: 08/09/2022 00:20       Discharge Exam: Vitals:   08/11/22 2030 08/12/22 0436  BP: (!) 131/56 116/65  Pulse: (!) 59 60  Resp: 20 (!) 22  Temp: 98.3 F (36.8 C) 98.2 F (36.8 C)  SpO2: 94% 92%    General: Pt is alert, awake, not in acute distress Cardiovascular: S1/S2 +, no edema Respiratory: CTA bilaterally, no wheezing, no rhonchi, no respiratory distress, no conversational dyspnea, on room air  Abdominal: Soft, NT, ND, bowel sounds +  Extremities: no edema, no cyanosis Psych: Normal mood and affect, stable judgement and insight     The results of significant diagnostics from this hospitalization (including imaging, microbiology, ancillary and laboratory) are listed  below for reference.     Microbiology: Recent Results (from the past 240 hour(s))  Resp panel by RT-PCR (RSV, Flu A&B, Covid) Anterior Nasal Swab     Status: None   Collection Time: 08/09/22 12:07 AM   Specimen: Anterior Nasal Swab  Result Value Ref Range Status   SARS Coronavirus 2 by RT PCR NEGATIVE NEGATIVE Final    Comment: (NOTE) SARS-CoV-2 target nucleic acids are NOT DETECTED.  The SARS-CoV-2 RNA is generally detectable in upper respiratory specimens during the acute phase of infection. The lowest concentration of SARS-CoV-2 viral copies this assay can detect is 138 copies/mL. A negative result does not preclude SARS-Cov-2 infection and should not be used as the sole basis for treatment or other patient management decisions. A negative result may occur with  improper specimen collection/handling, submission of specimen other than nasopharyngeal swab, presence of viral mutation(s) within the areas targeted by this assay, and inadequate number of viral copies(<138 copies/mL). A negative result must be combined with clinical observations, patient history, and epidemiological information. The expected result is Negative.  Fact Sheet for Patients:  BloggerCourse.com  Fact Sheet for Healthcare Providers:  SeriousBroker.it  This test is no t yet approved or cleared by the [place] FDA and  has been authorized for detection and/or diagnosis of SARS-CoV-2 by FDA under an Emergency Use Authorization (EUA). This EUA will remain  in effect (meaning this test can be used) for the duration of the COVID-19 declaration under Section 564(b)(1) of the Act, 21 U.S.C.section 360bbb-3(b)(1), unless the authorization is terminated  or revoked sooner.       Influenza A by PCR NEGATIVE NEGATIVE Final   Influenza B by PCR NEGATIVE NEGATIVE Final    Comment: (NOTE) The Xpert Xpress SARS-CoV-2/FLU/RSV plus assay is intended as an aid in  the diagnosis of influenza from Nasopharyngeal swab specimens and should not be used as a sole basis for treatment. Nasal washings and aspirates are unacceptable for Xpert Xpress SARS-CoV-2/FLU/RSV testing.  Fact Sheet for Patients: BloggerCourse.com  Fact Sheet for Healthcare Providers: SeriousBroker.it  This test is not yet approved or cleared by the [place] FDA and has been authorized for detection and/or diagnosis of SARS-CoV-2 by FDA under an Emergency Use Authorization (EUA). This EUA will remain in effect (meaning this test can be used) for the duration of the COVID-19 declaration under Section 564(b)(1) of the Act, 21 U.S.C. section 360bbb-3(b)(1), unless the authorization is terminated or revoked.     Resp Syncytial Virus by PCR NEGATIVE NEGATIVE Final    Comment: (NOTE) Fact Sheet for Patients: BloggerCourse.com  Fact Sheet for Healthcare Providers: SeriousBroker.it  This test is not yet approved or cleared by the [place] FDA and has been authorized for detection and/or diagnosis of SARS-CoV-2 by FDA under an Emergency Use Authorization (EUA). This EUA will remain in effect (meaning this test can be used) for the duration of the COVID-19 declaration under Section 564(b)(1) of the Act, 21 U.S.C. section 360bbb-3(b)(1), unless the authorization is terminated or revoked.  Performed at [facility], [address], [place], [place] [address]      Labs: BNP (last 3 results) Recent Labs    08/09/22 0700  BNP 229.6*   Basic Metabolic Panel: Recent Labs  Lab 08/09/22 0023 08/09/22  0501 08/10/22 0523 08/11/22 0512 08/12/22 0535  NA 136 134* 136 136 137  K 4.5 4.2 4.2 3.8 3.7  CL 107 104 104 104 104  CO2 19* 17* 21* 23 23  GLUCOSE 291* 343* 212* 153* 139*  BUN 58* 57* 71* 84* 80*  CREATININE 2.16* 2.24* 2.40* 2.13* 2.08*   CALCIUM 8.3* 8.2* 8.2* 7.8* 8.0*  MG  --  2.1  --   --   --   PHOS  --  4.4  --   --   --    Liver Function Tests: No results for input(s): "AST", "ALT", "ALKPHOS", "BILITOT", "PROT", "ALBUMIN" in the last 168 hours. No results for input(s): "LIPASE", "AMYLASE" in the last 168 hours. No results for input(s): "AMMONIA" in the last 168 hours. CBC: Recent Labs  Lab 08/09/22 0023 08/09/22 0501  WBC 7.4 5.7  NEUTROABS 5.6  --   HGB 8.7* 8.4*  HCT 28.0* 27.2*  MCV 92.1 92.5  PLT 132* 124*   Cardiac Enzymes: No results for input(s): "CKTOTAL", "CKMB", "CKMBINDEX", "TROPONINI" in the last 168 hours. BNP: Invalid input(s): "POCBNP" CBG: Recent Labs  Lab 08/11/22 0736 08/11/22 1138 08/11/22 1630 08/11/22 2025 08/12/22 0724  GLUCAP 143* 218* 129* 233* 134*   D-Dimer No results for input(s): "DDIMER" in the last 72 hours. Hgb A1c No results for input(s): "HGBA1C" in the last 72 hours. Lipid Profile No results for input(s): "CHOL", "HDL", "LDLCALC", "TRIG", "CHOLHDL", "LDLDIRECT" in the last 72 hours. Thyroid function studies No results for input(s): "TSH", "T4TOTAL", "T3FREE", "THYROIDAB" in the last 72 hours.  Invalid input(s): "FREET3" Anemia work up No results for input(s): "VITAMINB12", "FOLATE", "FERRITIN", "TIBC", "IRON", "RETICCTPCT" in the last 72 hours. Urinalysis    Component Value Date/Time   COLORURINE STRAW (A) 04/13/2022 1256   APPEARANCEUR CLEAR 04/13/2022 1256   LABSPEC 1.008 04/13/2022 1256   PHURINE 5.0 04/13/2022 1256   GLUCOSEU NEGATIVE 04/13/2022 1256   HGBUR LARGE (A) 04/13/2022 1256   BILIRUBINUR NEGATIVE 04/13/2022 1256   BILIRUBINUR small 09/20/2011 1037   KETONESUR NEGATIVE 04/13/2022 1256   PROTEINUR NEGATIVE 04/13/2022 1256   UROBILINOGEN 0.2 11/18/2014 1941   NITRITE NEGATIVE 04/13/2022 1256   LEUKOCYTESUR MODERATE (A) 04/13/2022 1256   Sepsis Labs Recent Labs  Lab 08/09/22 0023 08/09/22 0501  WBC 7.4 5.7   Microbiology Recent  Results (from the past 240 hour(s))  Resp panel by RT-PCR (RSV, Flu A&B, Covid) Anterior Nasal Swab     Status: None   Collection Time: 08/09/22 12:07 AM   Specimen: Anterior Nasal Swab  Result Value Ref Range Status   SARS Coronavirus 2 by RT PCR NEGATIVE NEGATIVE Final    Comment: (NOTE) SARS-CoV-2 target nucleic acids are NOT DETECTED.  The SARS-CoV-2 RNA is generally detectable in upper respiratory specimens during the acute phase of infection. The lowest concentration of SARS-CoV-2 viral copies this assay can detect is 138 copies/mL. A negative result does not preclude SARS-Cov-2 infection and should not be used as the sole basis for treatment or other patient management decisions. A negative result may occur with  improper specimen collection/handling, submission of specimen other than nasopharyngeal swab, presence of viral mutation(s) within the areas targeted by this assay, and inadequate number of viral copies(<138 copies/mL). A negative result must be combined with clinical observations, patient history, and epidemiological information. The expected result is Negative.  Fact Sheet for Patients:  BloggerCourse.com  Fact Sheet for Healthcare Providers:  SeriousBroker.it  This test is no t yet approved  or cleared by the [place] and  has been authorized for detection and/or diagnosis of SARS-CoV-2 by FDA under an Emergency Use Authorization (EUA). This EUA will remain  in effect (meaning this test can be used) for the duration of the COVID-19 declaration under Section 564(b)(1) of the Act, 21 U.S.C.section 360bbb-3(b)(1), unless the authorization is terminated  or revoked sooner.       Influenza A by PCR NEGATIVE NEGATIVE Final   Influenza B by PCR NEGATIVE NEGATIVE Final    Comment: (NOTE) The Xpert Xpress SARS-CoV-2/FLU/RSV plus assay is intended as an aid in the diagnosis of influenza from Nasopharyngeal  swab specimens and should not be used as a sole basis for treatment. Nasal washings and aspirates are unacceptable for Xpert Xpress SARS-CoV-2/FLU/RSV testing.  Fact Sheet for Patients: BloggerCourse.com  Fact Sheet for Healthcare Providers: SeriousBroker.it  This test is not yet approved or cleared by the [place] FDA and has been authorized for detection and/or diagnosis of SARS-CoV-2 by FDA under an Emergency Use Authorization (EUA). This EUA will remain in effect (meaning this test can be used) for the duration of the COVID-19 declaration under Section 564(b)(1) of the Act, 21 U.S.C. section 360bbb-3(b)(1), unless the authorization is terminated or revoked.     Resp Syncytial Virus by PCR NEGATIVE NEGATIVE Final    Comment: (NOTE) Fact Sheet for Patients: BloggerCourse.com  Fact Sheet for Healthcare Providers: SeriousBroker.it  This test is not yet approved or cleared by the [place] FDA and has been authorized for detection and/or diagnosis of SARS-CoV-2 by FDA under an Emergency Use Authorization (EUA). This EUA will remain in effect (meaning this test can be used) for the duration of the COVID-19 declaration under Section 564(b)(1) of the Act, 21 U.S.C. section 360bbb-3(b)(1), unless the authorization is terminated or revoked.  Performed at [facility], [address] [place], [place] [address]      Patient was seen and examined on the day of discharge and was found to be in stable condition. Time coordinating discharge: 35 minutes including assessment and coordination of care, as well as examination of the patient.   SIGNED:  Noralee Stain, DO [facility] 08/12/2022, 10:45 AM

## 2022-08-12 NOTE — TOC Transition Note (Addendum)
Transition of Care [facility]) - CM/SW Discharge Note   Patient Details  Name: Tiffany [name] MRN: [id number] Date of Birth: Sep 29, 1931  Transition of Care [facility]) CM/SW Contact:  Adrian Prows, RN Phone Number: 08/12/2022, 10:51 AM   Clinical Narrative:    D/C orders received; LVM for Amy at [facility] for notification pt d/c today; no TOC needs.   Final next level of care: Home/Self Care Barriers to Discharge: No Barriers Identified   Patient Goals and CMS Choice      Discharge Placement                         Discharge Plan and Services Additional resources added to the After Visit Summary for     Discharge Planning Services: CM Consult Post Acute Care Choice: Resumption of Svcs/PTA Provider [facility] services w/ Iantha Fallen)                               Social Determinants of Health (SDOH) Interventions SDOH Screenings   Food Insecurity: No Food Insecurity (08/10/2022)  Housing: Patient Declined (08/10/2022)  Transportation Needs: No Transportation Needs (08/10/2022)  Utilities: Not At Risk (08/10/2022)  Tobacco Use: Low Risk  (08/09/2022)     Readmission Risk Interventions     No data to display

## 2022-08-14 DIAGNOSIS — Z978 Presence of other specified devices: Secondary | ICD-10-CM | POA: Diagnosis not present

## 2022-08-14 DIAGNOSIS — E7800 Pure hypercholesterolemia, unspecified: Secondary | ICD-10-CM | POA: Diagnosis not present

## 2022-08-14 DIAGNOSIS — D6869 Other thrombophilia: Secondary | ICD-10-CM | POA: Diagnosis not present

## 2022-08-14 DIAGNOSIS — I129 Hypertensive chronic kidney disease with stage 1 through stage 4 chronic kidney disease, or unspecified chronic kidney disease: Secondary | ICD-10-CM | POA: Diagnosis not present

## 2022-08-14 DIAGNOSIS — N184 Chronic kidney disease, stage 4 (severe): Secondary | ICD-10-CM | POA: Diagnosis not present

## 2022-08-14 DIAGNOSIS — D696 Thrombocytopenia, unspecified: Secondary | ICD-10-CM | POA: Diagnosis not present

## 2022-08-14 DIAGNOSIS — E1169 Type 2 diabetes mellitus with other specified complication: Secondary | ICD-10-CM | POA: Diagnosis not present

## 2022-08-14 DIAGNOSIS — I480 Paroxysmal atrial fibrillation: Secondary | ICD-10-CM | POA: Diagnosis not present

## 2022-08-14 DIAGNOSIS — N3946 Mixed incontinence: Secondary | ICD-10-CM | POA: Diagnosis not present

## 2022-08-14 DIAGNOSIS — E039 Hypothyroidism, unspecified: Secondary | ICD-10-CM | POA: Diagnosis not present

## 2022-08-14 DIAGNOSIS — Z7901 Long term (current) use of anticoagulants: Secondary | ICD-10-CM | POA: Diagnosis not present

## 2022-08-14 DIAGNOSIS — I5032 Chronic diastolic (congestive) heart failure: Secondary | ICD-10-CM | POA: Diagnosis not present

## 2022-08-19 DIAGNOSIS — E039 Hypothyroidism, unspecified: Secondary | ICD-10-CM | POA: Diagnosis not present

## 2022-08-19 DIAGNOSIS — I480 Paroxysmal atrial fibrillation: Secondary | ICD-10-CM | POA: Diagnosis not present

## 2022-08-19 DIAGNOSIS — D6832 Hemorrhagic disorder due to extrinsic circulating anticoagulants: Secondary | ICD-10-CM | POA: Diagnosis not present

## 2022-08-19 DIAGNOSIS — Z7984 Long term (current) use of oral hypoglycemic drugs: Secondary | ICD-10-CM | POA: Diagnosis not present

## 2022-08-19 DIAGNOSIS — I5031 Acute diastolic (congestive) heart failure: Secondary | ICD-10-CM | POA: Diagnosis not present

## 2022-08-19 DIAGNOSIS — E1122 Type 2 diabetes mellitus with diabetic chronic kidney disease: Secondary | ICD-10-CM | POA: Diagnosis not present

## 2022-08-19 DIAGNOSIS — Z7901 Long term (current) use of anticoagulants: Secondary | ICD-10-CM | POA: Diagnosis not present

## 2022-08-19 DIAGNOSIS — Z7985 Long-term (current) use of injectable non-insulin antidiabetic drugs: Secondary | ICD-10-CM | POA: Diagnosis not present

## 2022-08-19 DIAGNOSIS — I272 Pulmonary hypertension, unspecified: Secondary | ICD-10-CM | POA: Diagnosis not present

## 2022-08-19 DIAGNOSIS — Z466 Encounter for fitting and adjustment of urinary device: Secondary | ICD-10-CM | POA: Diagnosis not present

## 2022-08-19 DIAGNOSIS — N184 Chronic kidney disease, stage 4 (severe): Secondary | ICD-10-CM | POA: Diagnosis not present

## 2022-08-19 DIAGNOSIS — E785 Hyperlipidemia, unspecified: Secondary | ICD-10-CM | POA: Diagnosis not present

## 2022-08-19 DIAGNOSIS — I2489 Other forms of acute ischemic heart disease: Secondary | ICD-10-CM | POA: Diagnosis not present

## 2022-08-19 DIAGNOSIS — I130 Hypertensive heart and chronic kidney disease with heart failure and stage 1 through stage 4 chronic kidney disease, or unspecified chronic kidney disease: Secondary | ICD-10-CM | POA: Diagnosis not present

## 2022-08-24 DIAGNOSIS — E1122 Type 2 diabetes mellitus with diabetic chronic kidney disease: Secondary | ICD-10-CM | POA: Diagnosis not present

## 2022-08-24 DIAGNOSIS — I272 Pulmonary hypertension, unspecified: Secondary | ICD-10-CM | POA: Diagnosis not present

## 2022-08-24 DIAGNOSIS — N184 Chronic kidney disease, stage 4 (severe): Secondary | ICD-10-CM | POA: Diagnosis not present

## 2022-08-24 DIAGNOSIS — Z7984 Long term (current) use of oral hypoglycemic drugs: Secondary | ICD-10-CM | POA: Diagnosis not present

## 2022-08-24 DIAGNOSIS — I130 Hypertensive heart and chronic kidney disease with heart failure and stage 1 through stage 4 chronic kidney disease, or unspecified chronic kidney disease: Secondary | ICD-10-CM | POA: Diagnosis not present

## 2022-08-24 DIAGNOSIS — I5031 Acute diastolic (congestive) heart failure: Secondary | ICD-10-CM | POA: Diagnosis not present

## 2022-08-30 DIAGNOSIS — E1122 Type 2 diabetes mellitus with diabetic chronic kidney disease: Secondary | ICD-10-CM | POA: Diagnosis not present

## 2022-08-30 DIAGNOSIS — Z7984 Long term (current) use of oral hypoglycemic drugs: Secondary | ICD-10-CM | POA: Diagnosis not present

## 2022-08-30 DIAGNOSIS — I5031 Acute diastolic (congestive) heart failure: Secondary | ICD-10-CM | POA: Diagnosis not present

## 2022-08-30 DIAGNOSIS — I130 Hypertensive heart and chronic kidney disease with heart failure and stage 1 through stage 4 chronic kidney disease, or unspecified chronic kidney disease: Secondary | ICD-10-CM | POA: Diagnosis not present

## 2022-08-30 DIAGNOSIS — I272 Pulmonary hypertension, unspecified: Secondary | ICD-10-CM | POA: Diagnosis not present

## 2022-08-30 DIAGNOSIS — N184 Chronic kidney disease, stage 4 (severe): Secondary | ICD-10-CM | POA: Diagnosis not present

## 2022-09-05 DIAGNOSIS — N184 Chronic kidney disease, stage 4 (severe): Secondary | ICD-10-CM | POA: Diagnosis not present

## 2022-09-05 DIAGNOSIS — I5031 Acute diastolic (congestive) heart failure: Secondary | ICD-10-CM | POA: Diagnosis not present

## 2022-09-05 DIAGNOSIS — E1122 Type 2 diabetes mellitus with diabetic chronic kidney disease: Secondary | ICD-10-CM | POA: Diagnosis not present

## 2022-09-05 DIAGNOSIS — I130 Hypertensive heart and chronic kidney disease with heart failure and stage 1 through stage 4 chronic kidney disease, or unspecified chronic kidney disease: Secondary | ICD-10-CM | POA: Diagnosis not present

## 2022-09-05 DIAGNOSIS — Z7984 Long term (current) use of oral hypoglycemic drugs: Secondary | ICD-10-CM | POA: Diagnosis not present

## 2022-09-05 DIAGNOSIS — I272 Pulmonary hypertension, unspecified: Secondary | ICD-10-CM | POA: Diagnosis not present

## 2022-09-06 DIAGNOSIS — N184 Chronic kidney disease, stage 4 (severe): Secondary | ICD-10-CM | POA: Diagnosis not present

## 2022-09-06 DIAGNOSIS — I5032 Chronic diastolic (congestive) heart failure: Secondary | ICD-10-CM | POA: Diagnosis not present

## 2022-09-06 DIAGNOSIS — E7800 Pure hypercholesterolemia, unspecified: Secondary | ICD-10-CM | POA: Diagnosis not present

## 2022-09-06 DIAGNOSIS — I480 Paroxysmal atrial fibrillation: Secondary | ICD-10-CM | POA: Diagnosis not present

## 2022-09-06 DIAGNOSIS — E1122 Type 2 diabetes mellitus with diabetic chronic kidney disease: Secondary | ICD-10-CM | POA: Diagnosis not present

## 2022-09-06 DIAGNOSIS — I10 Essential (primary) hypertension: Secondary | ICD-10-CM | POA: Diagnosis not present

## 2022-09-12 ENCOUNTER — Telehealth: Payer: Self-pay

## 2022-09-12 DIAGNOSIS — I272 Pulmonary hypertension, unspecified: Secondary | ICD-10-CM | POA: Diagnosis not present

## 2022-09-12 DIAGNOSIS — E1122 Type 2 diabetes mellitus with diabetic chronic kidney disease: Secondary | ICD-10-CM | POA: Diagnosis not present

## 2022-09-12 DIAGNOSIS — Z7984 Long term (current) use of oral hypoglycemic drugs: Secondary | ICD-10-CM | POA: Diagnosis not present

## 2022-09-12 DIAGNOSIS — I130 Hypertensive heart and chronic kidney disease with heart failure and stage 1 through stage 4 chronic kidney disease, or unspecified chronic kidney disease: Secondary | ICD-10-CM | POA: Diagnosis not present

## 2022-09-12 DIAGNOSIS — I5031 Acute diastolic (congestive) heart failure: Secondary | ICD-10-CM | POA: Diagnosis not present

## 2022-09-12 DIAGNOSIS — N184 Chronic kidney disease, stage 4 (severe): Secondary | ICD-10-CM | POA: Diagnosis not present

## 2022-09-12 NOTE — Telephone Encounter (Signed)
Tiffany [name], [facility] nurse with [facility] LVM reporting pt's INR. This pt is not in the [place] coumadin clinic. Contacted Tiffany [name] and advised her warfarin is managed by Dr. Azucena Cecil. Tiffany [name] verbalized understanding and said she would contact their office.

## 2022-09-13 DIAGNOSIS — I5031 Acute diastolic (congestive) heart failure: Secondary | ICD-10-CM | POA: Diagnosis not present

## 2022-09-13 DIAGNOSIS — E1122 Type 2 diabetes mellitus with diabetic chronic kidney disease: Secondary | ICD-10-CM | POA: Diagnosis not present

## 2022-09-13 DIAGNOSIS — N184 Chronic kidney disease, stage 4 (severe): Secondary | ICD-10-CM | POA: Diagnosis not present

## 2022-09-13 DIAGNOSIS — Z7984 Long term (current) use of oral hypoglycemic drugs: Secondary | ICD-10-CM | POA: Diagnosis not present

## 2022-09-13 DIAGNOSIS — I272 Pulmonary hypertension, unspecified: Secondary | ICD-10-CM | POA: Diagnosis not present

## 2022-09-13 DIAGNOSIS — I130 Hypertensive heart and chronic kidney disease with heart failure and stage 1 through stage 4 chronic kidney disease, or unspecified chronic kidney disease: Secondary | ICD-10-CM | POA: Diagnosis not present

## 2022-09-18 DIAGNOSIS — Z466 Encounter for fitting and adjustment of urinary device: Secondary | ICD-10-CM | POA: Diagnosis not present

## 2022-09-18 DIAGNOSIS — I2489 Other forms of acute ischemic heart disease: Secondary | ICD-10-CM | POA: Diagnosis not present

## 2022-09-18 DIAGNOSIS — I272 Pulmonary hypertension, unspecified: Secondary | ICD-10-CM | POA: Diagnosis not present

## 2022-09-18 DIAGNOSIS — E039 Hypothyroidism, unspecified: Secondary | ICD-10-CM | POA: Diagnosis not present

## 2022-09-18 DIAGNOSIS — E785 Hyperlipidemia, unspecified: Secondary | ICD-10-CM | POA: Diagnosis not present

## 2022-09-18 DIAGNOSIS — Z7984 Long term (current) use of oral hypoglycemic drugs: Secondary | ICD-10-CM | POA: Diagnosis not present

## 2022-09-18 DIAGNOSIS — Z7985 Long-term (current) use of injectable non-insulin antidiabetic drugs: Secondary | ICD-10-CM | POA: Diagnosis not present

## 2022-09-18 DIAGNOSIS — I5031 Acute diastolic (congestive) heart failure: Secondary | ICD-10-CM | POA: Diagnosis not present

## 2022-09-18 DIAGNOSIS — Z7901 Long term (current) use of anticoagulants: Secondary | ICD-10-CM | POA: Diagnosis not present

## 2022-09-18 DIAGNOSIS — E1122 Type 2 diabetes mellitus with diabetic chronic kidney disease: Secondary | ICD-10-CM | POA: Diagnosis not present

## 2022-09-18 DIAGNOSIS — D6832 Hemorrhagic disorder due to extrinsic circulating anticoagulants: Secondary | ICD-10-CM | POA: Diagnosis not present

## 2022-09-18 DIAGNOSIS — I480 Paroxysmal atrial fibrillation: Secondary | ICD-10-CM | POA: Diagnosis not present

## 2022-09-18 DIAGNOSIS — I130 Hypertensive heart and chronic kidney disease with heart failure and stage 1 through stage 4 chronic kidney disease, or unspecified chronic kidney disease: Secondary | ICD-10-CM | POA: Diagnosis not present

## 2022-09-18 DIAGNOSIS — N184 Chronic kidney disease, stage 4 (severe): Secondary | ICD-10-CM | POA: Diagnosis not present

## 2022-09-19 ENCOUNTER — Ambulatory Visit (INDEPENDENT_AMBULATORY_CARE_PROVIDER_SITE_OTHER): Payer: Medicare Other

## 2022-09-19 DIAGNOSIS — I130 Hypertensive heart and chronic kidney disease with heart failure and stage 1 through stage 4 chronic kidney disease, or unspecified chronic kidney disease: Secondary | ICD-10-CM | POA: Diagnosis not present

## 2022-09-19 DIAGNOSIS — N184 Chronic kidney disease, stage 4 (severe): Secondary | ICD-10-CM | POA: Diagnosis not present

## 2022-09-19 DIAGNOSIS — I272 Pulmonary hypertension, unspecified: Secondary | ICD-10-CM | POA: Diagnosis not present

## 2022-09-19 DIAGNOSIS — Z7984 Long term (current) use of oral hypoglycemic drugs: Secondary | ICD-10-CM | POA: Diagnosis not present

## 2022-09-19 DIAGNOSIS — E1122 Type 2 diabetes mellitus with diabetic chronic kidney disease: Secondary | ICD-10-CM | POA: Diagnosis not present

## 2022-09-19 DIAGNOSIS — I495 Sick sinus syndrome: Secondary | ICD-10-CM | POA: Diagnosis not present

## 2022-09-19 DIAGNOSIS — I5031 Acute diastolic (congestive) heart failure: Secondary | ICD-10-CM | POA: Diagnosis not present

## 2022-09-19 LAB — CUP PACEART REMOTE DEVICE CHECK
Battery Remaining Longevity: 52 mo
Battery Voltage: 2.95 V
Brady Statistic AP VP Percent: 99.95 %
Brady Statistic AP VS Percent: 0 %
Brady Statistic AS VP Percent: 0 %
Brady Statistic AS VS Percent: 0.04 %
Brady Statistic RA Percent Paced: 100 %
Brady Statistic RV Percent Paced: 99.95 %
Date Time Interrogation Session: 20240723020303
Implantable Lead Connection Status: 753985
Implantable Lead Connection Status: 753985
Implantable Lead Implant Date: 19990907
Implantable Lead Implant Date: 19990907
Implantable Lead Location: 753859
Implantable Lead Location: 753860
Implantable Lead Serial Number: 32904
Implantable Lead Serial Number: 33421
Implantable Pulse Generator Implant Date: 20211014
Lead Channel Impedance Value: 228 Ohm
Lead Channel Impedance Value: 228 Ohm
Lead Channel Impedance Value: 247 Ohm
Lead Channel Impedance Value: 266 Ohm
Lead Channel Pacing Threshold Amplitude: 0.875 V
Lead Channel Pacing Threshold Pulse Width: 0.4 ms
Lead Channel Sensing Intrinsic Amplitude: 12.625 mV
Lead Channel Sensing Intrinsic Amplitude: 12.625 mV
Lead Channel Setting Pacing Amplitude: 2 V
Lead Channel Setting Pacing Amplitude: 2.5 V
Lead Channel Setting Pacing Pulse Width: 0.4 ms
Lead Channel Setting Sensing Sensitivity: 8 mV
Zone Setting Status: 755011
Zone Setting Status: 755011

## 2022-10-02 DIAGNOSIS — E1122 Type 2 diabetes mellitus with diabetic chronic kidney disease: Secondary | ICD-10-CM | POA: Diagnosis not present

## 2022-10-02 DIAGNOSIS — I272 Pulmonary hypertension, unspecified: Secondary | ICD-10-CM | POA: Diagnosis not present

## 2022-10-02 DIAGNOSIS — Z7984 Long term (current) use of oral hypoglycemic drugs: Secondary | ICD-10-CM | POA: Diagnosis not present

## 2022-10-02 DIAGNOSIS — I5031 Acute diastolic (congestive) heart failure: Secondary | ICD-10-CM | POA: Diagnosis not present

## 2022-10-02 DIAGNOSIS — I130 Hypertensive heart and chronic kidney disease with heart failure and stage 1 through stage 4 chronic kidney disease, or unspecified chronic kidney disease: Secondary | ICD-10-CM | POA: Diagnosis not present

## 2022-10-02 DIAGNOSIS — N184 Chronic kidney disease, stage 4 (severe): Secondary | ICD-10-CM | POA: Diagnosis not present

## 2022-10-03 NOTE — Progress Notes (Signed)
Remote pacemaker transmission.   

## 2022-10-09 ENCOUNTER — Other Ambulatory Visit: Payer: Self-pay

## 2022-10-09 ENCOUNTER — Emergency Department (HOSPITAL_COMMUNITY)
Admission: EM | Admit: 2022-10-09 | Discharge: 2022-10-10 | Disposition: A | Discharge status: Home / Self Care | Payer: Medicare Other | Source: Home / Self Care | Attending: Emergency Medicine | Admitting: Emergency Medicine

## 2022-10-09 ENCOUNTER — Ambulatory Visit
Admission: EM | Admit: 2022-10-09 | Discharge: 2022-10-09 | Disposition: A | Discharge status: Home / Self Care | Payer: Medicare Other

## 2022-10-09 ENCOUNTER — Emergency Department (HOSPITAL_COMMUNITY): Payer: Medicare Other

## 2022-10-09 ENCOUNTER — Encounter (HOSPITAL_COMMUNITY): Payer: Self-pay | Admitting: Emergency Medicine

## 2022-10-09 DIAGNOSIS — R0989 Other specified symptoms and signs involving the circulatory and respiratory systems: Secondary | ICD-10-CM | POA: Diagnosis not present

## 2022-10-09 DIAGNOSIS — I130 Hypertensive heart and chronic kidney disease with heart failure and stage 1 through stage 4 chronic kidney disease, or unspecified chronic kidney disease: Secondary | ICD-10-CM | POA: Diagnosis not present

## 2022-10-09 DIAGNOSIS — D649 Anemia, unspecified: Secondary | ICD-10-CM | POA: Diagnosis not present

## 2022-10-09 DIAGNOSIS — N189 Chronic kidney disease, unspecified: Secondary | ICD-10-CM | POA: Insufficient documentation

## 2022-10-09 DIAGNOSIS — N184 Chronic kidney disease, stage 4 (severe): Secondary | ICD-10-CM | POA: Diagnosis not present

## 2022-10-09 DIAGNOSIS — E1122 Type 2 diabetes mellitus with diabetic chronic kidney disease: Secondary | ICD-10-CM | POA: Insufficient documentation

## 2022-10-09 DIAGNOSIS — N289 Disorder of kidney and ureter, unspecified: Secondary | ICD-10-CM | POA: Insufficient documentation

## 2022-10-09 DIAGNOSIS — R0602 Shortness of breath: Secondary | ICD-10-CM | POA: Diagnosis not present

## 2022-10-09 DIAGNOSIS — Z7901 Long term (current) use of anticoagulants: Secondary | ICD-10-CM | POA: Diagnosis not present

## 2022-10-09 DIAGNOSIS — I509 Heart failure, unspecified: Secondary | ICD-10-CM | POA: Diagnosis not present

## 2022-10-09 DIAGNOSIS — I272 Pulmonary hypertension, unspecified: Secondary | ICD-10-CM | POA: Diagnosis not present

## 2022-10-09 DIAGNOSIS — I517 Cardiomegaly: Secondary | ICD-10-CM | POA: Diagnosis not present

## 2022-10-09 DIAGNOSIS — Z7984 Long term (current) use of oral hypoglycemic drugs: Secondary | ICD-10-CM | POA: Diagnosis not present

## 2022-10-09 DIAGNOSIS — R059 Cough, unspecified: Secondary | ICD-10-CM | POA: Diagnosis not present

## 2022-10-09 DIAGNOSIS — I110 Hypertensive heart disease with heart failure: Secondary | ICD-10-CM | POA: Diagnosis not present

## 2022-10-09 DIAGNOSIS — I5031 Acute diastolic (congestive) heart failure: Secondary | ICD-10-CM | POA: Diagnosis not present

## 2022-10-09 LAB — CBC WITH DIFFERENTIAL/PLATELET
Abs Immature Granulocytes: 0.02 10*3/uL (ref 0.00–0.07)
Basophils Absolute: 0 10*3/uL (ref 0.0–0.1)
Basophils Relative: 1 %
Eosinophils Absolute: 0.7 10*3/uL — ABNORMAL HIGH (ref 0.0–0.5)
Eosinophils Relative: 12 %
HCT: 27.5 % — ABNORMAL LOW (ref 36.0–46.0)
Hemoglobin: 8.3 g/dL — ABNORMAL LOW (ref 12.0–15.0)
Immature Granulocytes: 0 %
Lymphocytes Relative: 12 %
Lymphs Abs: 0.7 10*3/uL (ref 0.7–4.0)
MCH: 29.4 pg (ref 26.0–34.0)
MCHC: 30.2 g/dL (ref 30.0–36.0)
MCV: 97.5 fL (ref 80.0–100.0)
Monocytes Absolute: 0.5 10*3/uL (ref 0.1–1.0)
Monocytes Relative: 9 %
Neutro Abs: 3.7 10*3/uL (ref 1.7–7.7)
Neutrophils Relative %: 66 %
Platelets: 180 10*3/uL (ref 150–400)
RBC: 2.82 MIL/uL — ABNORMAL LOW (ref 3.87–5.11)
RDW: 17.7 % — ABNORMAL HIGH (ref 11.5–15.5)
WBC: 5.6 10*3/uL (ref 4.0–10.5)
nRBC: 0 % (ref 0.0–0.2)

## 2022-10-09 LAB — BRAIN NATRIURETIC PEPTIDE: B Natriuretic Peptide: 146.3 pg/mL — ABNORMAL HIGH (ref 0.0–100.0)

## 2022-10-09 LAB — BASIC METABOLIC PANEL
Anion gap: 11 (ref 5–15)
BUN: 72 mg/dL — ABNORMAL HIGH (ref 8–23)
CO2: 21 mmol/L — ABNORMAL LOW (ref 22–32)
Calcium: 9 mg/dL (ref 8.9–10.3)
Chloride: 108 mmol/L (ref 98–111)
Creatinine, Ser: 1.95 mg/dL — ABNORMAL HIGH (ref 0.44–1.00)
GFR, Estimated: 24 mL/min — ABNORMAL LOW (ref >60)
Glucose, Bld: 132 mg/dL — ABNORMAL HIGH (ref 70–99)
Potassium: 3.9 mmol/L (ref 3.5–5.1)
Sodium: 140 mmol/L (ref 135–145)

## 2022-10-09 LAB — CBG MONITORING, ED: Glucose-Capillary: 128 mg/dL — ABNORMAL HIGH (ref 70–99)

## 2022-10-09 LAB — TROPONIN I (HIGH SENSITIVITY): Troponin I (High Sensitivity): 17 ng/L (ref <18)

## 2022-10-09 NOTE — ED Provider Notes (Signed)
[facility]    CSN: [id number] Arrival date & time: 10/09/22  1744      History   Chief Complaint Chief Complaint  Patient presents with   Wheezing   Otalgia    HPI Tiffany [name] is a 89 y.o. female.   Patient here today with family members for evaluation of wheezing and shortness of breath.  They state that the nurse who checked her out earlier said that she had fluid on her lungs and recommended she be seen.  The history is provided by the patient.  Wheezing Associated symptoms: ear pain and shortness of breath   Associated symptoms: no fever   Otalgia Associated symptoms: no abdominal pain, no fever and no vomiting     Past Medical History:  Diagnosis Date   Chronic kidney disease    Diverticulosis    DM2 (diabetes mellitus, type 2) (HCC)    Esophageal reflux    HTN (hypertension)    Hyperlipidemia    Hypothyroidism    Microalbuminuria    last done in 1/07    Paroxysmal atrial fibrillation (HCC)    and atrial tachycardia   Presence of permanent cardiac pacemaker    Stroke (HCC) 1999   Tachycardia-bradycardia syndrome (HCC)    s/p PPM   Unspecified hypothyroidism 11/20/2013    Patient Active Problem List   Diagnosis Date Noted   Pressure injury of skin 08/10/2022   Pulmonary hypertension (HCC) 08/10/2022   Acute diastolic (congestive) heart failure (HCC) 08/09/2022   GI bleed 05/25/2020   Symptomatic anemia 05/25/2020   Acute renal failure (ARF) (HCC) 10/30/2016   Acute renal failure syndrome (HCC)    Dehydration    ARF (acute renal failure) (HCC) 11/19/2014   Acute gastroenteritis 11/19/2014   UTI (urinary tract infection) 11/22/2013   Hypothyroidism 11/20/2013   Acute blood loss anemia 11/20/2013   Hematochezia 11/18/2013   Warfarin-induced coagulopathy (HCC) 11/18/2013   Pacemaker-Medtronic 01/04/2012   Long term (current) use of anticoagulants 10/25/2011   Tachycardia-bradycardia syndrome (HCC) 01/04/2011   Atrial  fibrillation (HCC) 01/05/2010   Hyperlipidemia 07/28/2006   Essential hypertension 07/28/2006   CARDIOMYOPATHY 07/28/2006   DM type 2 (diabetes mellitus, type 2) (HCC) 05/16/2006   C V A/STROKE 05/16/2006   HEMORRHOIDS, WITH BLEEDING 05/16/2006    Past Surgical History:  Procedure Laterality Date   CHOLECYSTECTOMY     COLONOSCOPY N/A 11/21/2013   Procedure: COLONOSCOPY;  Surgeon: Petra Kuba, MD;  Location: [facility] ENDOSCOPY;  Service: Endoscopy;  Laterality: N/A;   ESOPHAGOGASTRODUODENOSCOPY (EGD) WITH PROPOFOL N/A 05/26/2020   Procedure: ESOPHAGOGASTRODUODENOSCOPY (EGD) WITH PROPOFOL;  Surgeon: Kerin Salen, MD;  Location: [facility] ENDOSCOPY;  Service: Gastroenterology;  Laterality: N/A;   HEMORRHOID SURGERY     hysterectomy (other)     PACEMAKER GENERATOR CHANGE  10/03/12   DMT Advisa DR gen change by Dr Johney Frame   PACEMAKER GENERATOR CHANGE N/A 10/03/2012   Procedure: PACEMAKER GENERATOR CHANGE;  Surgeon: Hillis Range, MD;  [facility] C[facility] LAB;  Service: Cardiovascular;  Laterality: N/A;   permanent pacemaker  1999   updated 2006 (MDT) by Dr Reyes Ivan; gen change 09-2012 by Dr Johney Frame (MDT)   PPM GENERATOR CHANGEOUT N/A 12/11/2019   Procedure: PPM GENERATOR CHANGEOUT;  Surgeon: Hillis Range, MD;  Location: [facility] INVASIVE CV LAB;  Service: Cardiovascular;  Laterality: N/A;    OB History   No obstetric history on file.      Home Medications    Prior to Admission medications   Medication Sig  Start Date End Date Taking? Authorizing Provider  acetaminophen (TYLENOL) 650 MG CR tablet Take 650 mg by mouth every 8 (eight) hours as needed for pain.    [provider]  Calcium Citrate 250 MG TABS Take 250 mg by mouth every morning.    [provider]  cetirizine (ZYRTEC) 10 MG tablet Take 10 mg by mouth daily.    [provider]  diltiazem (TIAZAC) 120 MG 24 hr capsule Take 120 mg by mouth daily.    [provider]  FERGON 240 (27 FE) MG tablet Take 240 mg by mouth in  the morning and at bedtime. 10/31/14   [provider]  furosemide (LASIX) 40 MG tablet Take 1 tablet (40 mg total) by mouth every morning. 08/12/22 10/11/22  Noralee Stain, DO  glipiZIDE (GLUCOTROL) 5 MG tablet Take 5 mg by mouth daily before breakfast.     [provider]  levothyroxine (SYNTHROID, LEVOTHROID) 50 MCG tablet Take 50 mcg by mouth daily before breakfast.     [provider]  liver oil-zinc oxide (DESITIN) 40 % ointment Apply topically as needed for irritation. 08/12/22   Noralee Stain, DO  melatonin 3 MG TABS tablet Take 3 mg by mouth at bedtime.    [provider]  Multiple Vitamins-Minerals (MULTIVITAMIN WITH MINERALS) tablet Take 1 tablet by mouth daily.    [provider]  OZEMPIC, 1 MG/DOSE, 4 MG/3ML SOPN Inject 1 mg into the skin once a week. 04/29/20   [provider]  potassium chloride SA (K-DUR,KLOR-CON) 20 MEQ tablet Take 20 mEq by mouth every morning.    [provider]  rosuvastatin (CRESTOR) 10 MG tablet Take 10 mg by mouth daily.    [provider]  warfarin (COUMADIN) 1 MG tablet Take 1.5 mg by mouth daily.    [provider]    Family History Family History  Problem Relation Age of Onset   Tuberculosis Mother    Diabetes Father     Social History Social History   Tobacco Use   Smoking status: Never    Passive exposure: Never   Smokeless tobacco: Never  Vaping Use   Vaping status: Never Used  Substance Use Topics   Alcohol use: No   Drug use: No     Allergies   Sitagliptin   Review of Systems Review of Systems  Constitutional:  Negative for chills and fever.  HENT:  Positive for ear pain.   Eyes:  Negative for discharge and redness.  Respiratory:  Positive for shortness of breath and wheezing.   Gastrointestinal:  Negative for abdominal pain, nausea and vomiting.     Physical Exam Triage Vital Signs ED Triage Vitals  Encounter Vitals Group     BP 10/09/22  1758 (!) 147/66     Systolic BP Percentile --      Diastolic BP Percentile --      Pulse Rate 10/09/22 1758 63     Resp 10/09/22 1758 (!) 24     Temp 10/09/22 1758 97.7 F (36.5 C)     Temp Source 10/09/22 1758 Oral     SpO2 10/09/22 1758 96 %     Weight 10/09/22 1756 159 lb (72.1 kg)     Height 10/09/22 1756 5' (1.524 m)     Head Circumference --      Peak Flow --      Pain Score --      Pain Loc --      Pain  Education --      Exclude from [employer] Chart --    No data found.  Updated Vital Signs BP (!) 141/55 (BP Location: Right Arm)   Pulse 64   Temp 97.7 F (36.5 C) (Oral)   Resp (!) 22   Ht 5' (1.524 m)   Wt 159 lb (72.1 kg)   SpO2 95%   BMI 31.05 kg/m   Visual Acuity Right Eye Distance:   Left Eye Distance:   Bilateral Distance:    Right Eye Near:   Left Eye Near:    Bilateral Near:     Physical Exam Vitals and nursing note reviewed.  Constitutional:      General: She is not in acute distress.    Appearance: She is not ill-appearing.     Comments: Appears frail  HENT:     Head: Normocephalic and atraumatic.  Eyes:     Conjunctiva/sclera: Conjunctivae normal.  Cardiovascular:     Rate and Rhythm: Normal rate.     Heart sounds: Murmur heard.  Pulmonary:     Effort: Respiratory distress (mild tachypnea) present.     Breath sounds: Rhonchi (bases bilaterally) present.  Neurological:     Mental Status: She is alert.  Psychiatric:        Mood and Affect: Mood normal.        Behavior: Behavior normal.        Thought Content: Thought content normal.      UC Treatments / Results  Labs (all labs ordered are listed, but only abnormal results are displayed) Labs Reviewed - No data to display  EKG   Radiology No results found.  Procedures Procedures (including critical care time)  Medications Ordered in UC Medications - No data to display  Initial Impression / Assessment and Plan / UC Course  I have reviewed the triage vital signs and the  nursing notes.  Pertinent labs & imaging results that were available during my care of the patient were reviewed by me and considered in my medical decision making (see chart for details).    Given age and comorbidities recommended further evaluation in the emergency room for stat labs and imaging.  Patient is agreeable to same and family members will transport her via POV.  Final Clinical Impressions(s) / UC Diagnoses   Final diagnoses:  Shortness of breath  Abnormal lung sounds   Discharge Instructions   None    ED Prescriptions   None    PDMP not reviewed this encounter.   Tomi Bamberger, PA-C 10/09/22 1825

## 2022-10-09 NOTE — ED Triage Notes (Signed)
Spoke with Daughter. "The nurse saw here today and heard fluid in her lungs, left ear is ringing". PCP referred to UC. No fever.

## 2022-10-09 NOTE — ED Triage Notes (Signed)
Pt arrives sent from [facility] w/ [facility], "fluid in lungs" was given lasix when previously seen for same. Feels the same as the time before.

## 2022-10-09 NOTE — ED Provider Triage Note (Signed)
Emergency Medicine Provider Triage Evaluation Note  [name] , a 89 y.o. female  was evaluated in triage.  Pt complains of SOB. Chronic however worse than baseline. No fever, pain, le swelling. Hx of pleural effusion req lasix. Feels similar  Review of Systems  Positive: Sob, cough Negative: Fever, le edema  Physical Exam  BP (!) 136/42   Pulse 62   Resp 20   SpO2 100%  Gen:   Awake, no distress   Resp:  Normal effort  MSK:   Moves extremities without difficulty  Other:    Medical Decision Making  Medically screening exam initiated at 7:46 PM.  Appropriate orders placed.  Tiffany [name] was informed that the remainder of the evaluation will be completed by another provider, this initial triage assessment does not replace that evaluation, and the importance of remaining in the ED until their evaluation is complete.  sob   ,  A, PA-C 10/09/22 1946

## 2022-10-10 DIAGNOSIS — D649 Anemia, unspecified: Secondary | ICD-10-CM | POA: Diagnosis not present

## 2022-10-10 MED ORDER — FUROSEMIDE 40 MG PO TABS: ORAL_TABLET | ORAL | 1 refills | Status: AC

## 2022-10-10 NOTE — ED Provider Notes (Signed)
[place] EMERGENCY DEPARTMENT AT [facility] Provider Note   CSN: [id number] Arrival date & time: 10/09/22  9528     History  Chief Complaint  Patient presents with   Shortness of Breath    Tiffany [name] is a 89 y.o. female.  The history is provided by the patient.  Shortness of Breath She has history of hypertension, diabetes, hyperlipidemia, chronic kidney disease, paroxysmal atrial fibrillation anticoagulated on warfarin, stroke, heart failure with preserved ejection fraction was sent here from urgent care because of shortness of breath.  She was diagnosed with heart failure 2 months ago.  Home health nurse noted crackles in the lungs and called her primary care provider who recommended that she be seen to rule out pneumonia.  She has had a nonproductive cough but no fever or chills.  Family actually states that she has not been any more short of breath than normal.  She has had some soreness in the left lateral chest which she relates to her coughing.   Home Medications Prior to Admission medications   Medication Sig Start Date End Date Taking? Authorizing Provider  acetaminophen (TYLENOL) 650 MG CR tablet Take 650 mg by mouth every 8 (eight) hours as needed for pain.    [provider]  Calcium Citrate 250 MG TABS Take 250 mg by mouth every morning.    [provider]  cetirizine (ZYRTEC) 10 MG tablet Take 10 mg by mouth daily.    [provider]  diltiazem (TIAZAC) 120 MG 24 hr capsule Take 120 mg by mouth daily.    [provider]  FERGON 240 (27 FE) MG tablet Take 240 mg by mouth in the morning and at bedtime. 10/31/14   [provider]  furosemide (LASIX) 40 MG tablet Take 1 tablet (40 mg total) by mouth every morning. 08/12/22 10/11/22  Noralee Stain, DO  glipiZIDE (GLUCOTROL) 5 MG tablet Take 5 mg by mouth daily before breakfast.     [provider]  levothyroxine (SYNTHROID, LEVOTHROID) 50 MCG tablet  Take 50 mcg by mouth daily before breakfast.     [provider]  liver oil-zinc oxide (DESITIN) 40 % ointment Apply topically as needed for irritation. 08/12/22   Noralee Stain, DO  melatonin 3 MG TABS tablet Take 3 mg by mouth at bedtime.    [provider]  Multiple Vitamins-Minerals (MULTIVITAMIN WITH MINERALS) tablet Take 1 tablet by mouth daily.    [provider]  OZEMPIC, 1 MG/DOSE, 4 MG/3ML SOPN Inject 1 mg into the skin once a week. 04/29/20   [provider]  potassium chloride SA (K-DUR,KLOR-CON) 20 MEQ tablet Take 20 mEq by mouth every morning.    [provider]  rosuvastatin (CRESTOR) 10 MG tablet Take 10 mg by mouth daily.    [provider]  warfarin (COUMADIN) 1 MG tablet Take 1.5 mg by mouth daily.    [provider]      Allergies    Sitagliptin    Review of Systems   Review of Systems  Respiratory:  Positive for shortness of breath.   All other systems reviewed and are negative.   Physical Exam Updated Vital Signs BP 131/63   Pulse 63   Temp 98 F (36.7 C)   Resp 17   SpO2 100%  Physical Exam Vitals and nursing note reviewed.   89 year old female, resting comfortably and in no acute distress. Vital signs are normal. Oxygen saturation is 100%, which  is normal. Head is normocephalic and atraumatic. PERRLA, EOMI. Oropharynx is clear. Neck is nontender and supple without adenopathy or JVD. Back is nontender and there is no CVA tenderness. Lungs have rales and rhonchi about halfway up. Chest is mildly tender in the left anterolateral rib cage, which reproduces her pain. Heart has regular rate and rhythm with 4/6 systolic ejection murmur heard along the left sternal border. Abdomen is soft, flat, nontender. Extremities have no cyanosis or edema, full range of motion is present. Skin is warm and dry without rash. Neurologic: Mental status is normal, cranial nerves are intact, moves all extremities  equally.  ED Results / Procedures / Treatments   Labs (all labs ordered are listed, but only abnormal results are displayed) Labs Reviewed  CBC WITH DIFFERENTIAL/PLATELET - Abnormal; Notable for the following components:      Result Value   RBC 2.82 (*)    Hemoglobin 8.3 (*)    HCT 27.5 (*)    RDW 17.7 (*)    Eosinophils Absolute 0.7 (*)    All other components within normal limits  BASIC METABOLIC PANEL - Abnormal; Notable for the following components:   CO2 21 (*)    Glucose, Bld 132 (*)    BUN 72 (*)    Creatinine, Ser 1.95 (*)    GFR, Estimated 24 (*)    All other components within normal limits  BRAIN NATRIURETIC PEPTIDE - Abnormal; Notable for the following components:   B Natriuretic Peptide 146.3 (*)    All other components within normal limits  CBG MONITORING, ED - Abnormal; Notable for the following components:   Glucose-Capillary 128 (*)    All other components within normal limits  TROPONIN I (HIGH SENSITIVITY)  TROPONIN I (HIGH SENSITIVITY)    EKG EKG Interpretation Date/Time:  Monday October 09 2022 20:38:10 EDT Ventricular Rate:  61 PR Interval:  194 QRS Duration:  176 QT Interval:  500 QTC Calculation: 503 R Axis:   -46  Text Interpretation: AV dual-paced rhythm Abnormal ECG When compared with ECG of 09-Aug-2022 00:01, No significant change was found Confirmed by Dione Booze ([id number]) on 10/10/2022 2:52:56 AM  Radiology DG Chest 2 View  Result Date: 10/09/2022 CLINICAL DATA:  Shortness of breath with history of fluid in the lungs. Wet cough. Nonsmoker. EXAM: CHEST - 2 VIEW COMPARISON:  08/11/2022 FINDINGS: Cardiac pacemaker. Cardiac enlargement. Mild vascular congestion. No airspace disease or consolidation. No pleural effusions. No pneumothorax. Mediastinal contours appear intact. Degenerative changes in the spine and shoulders. IMPRESSION: Cardiac enlargement with mild vascular congestion. No edema or consolidation in the lungs. Electronically Signed   By:  Burman Nieves M.D.   On: 10/09/2022 20:22    Procedures Procedures    Medications Ordered in ED Medications - No data to display  ED Course/ Medical Decision Making/ A&P                                 Medical Decision Making  Rales likely secondary to known heart failure, but clinically patient is at her baseline.  Cough, need to rule out pneumonia.  Chest x-ray shows cardiomegaly with no edema or consolidation.  Have independently viewed the images, and agree with the radiologist interpretation.  Have reviewed and interpreted her electrocardiogram and my interpretation is AV dual paced rhythm, unchanged from prior.  I have reviewed and interpreted her laboratory tests, and my interpretation is stable anemia, stable renal insufficiency,  mild elevation of BNP significantly less than when hospitalized 2 months ago.  Troponin is normal, repeat is essentially unchanged although technically high.  I have reviewed her past records, and she was admitted from 08/08/2022-08/12/2018 for with acute diastolic heart failure.  Echocardiogram on 08/09/2022 showed ejection fraction 60-65% with severe asymmetric left ventricular hypertrophy and indeterminate diastolic parameters.  As near as I can tell, patient is at her baseline, no indication for hospital admission.  She is maintaining excellent oxygen saturation on room air.  I am recommending that she increase her furosemide to 80 mg a day for 3 days and then drop back to her usual 40 mg a day.  I offered the option of cardiology referral to family, but they feel that she can be adequately managed by her primary care provider.  She already sees cardiology for pacemaker follow-up.  Final Clinical Impression(s) / ED Diagnoses Final diagnoses:  Acute on chronic congestive heart failure, unspecified heart failure type (HCC)  Renal insufficiency  Normochromic normocytic anemia  Anticoagulated on warfarin    Rx / DC Orders ED Discharge Orders     None          Dione Booze, MD 10/10/22 0330

## 2022-10-10 NOTE — Discharge Instructions (Addendum)
Increase your furosemide to 2 tablets (80 mg) a day for the next 3 days.  Then resume taking 1 tablet (40 mg) every day.  Return to the emergency department if your symptoms are getting worse.

## 2022-10-12 DIAGNOSIS — I272 Pulmonary hypertension, unspecified: Secondary | ICD-10-CM | POA: Diagnosis not present

## 2022-10-12 DIAGNOSIS — I130 Hypertensive heart and chronic kidney disease with heart failure and stage 1 through stage 4 chronic kidney disease, or unspecified chronic kidney disease: Secondary | ICD-10-CM | POA: Diagnosis not present

## 2022-10-12 DIAGNOSIS — E1122 Type 2 diabetes mellitus with diabetic chronic kidney disease: Secondary | ICD-10-CM | POA: Diagnosis not present

## 2022-10-12 DIAGNOSIS — Z7984 Long term (current) use of oral hypoglycemic drugs: Secondary | ICD-10-CM | POA: Diagnosis not present

## 2022-10-12 DIAGNOSIS — I5031 Acute diastolic (congestive) heart failure: Secondary | ICD-10-CM | POA: Diagnosis not present

## 2022-10-12 DIAGNOSIS — N184 Chronic kidney disease, stage 4 (severe): Secondary | ICD-10-CM | POA: Diagnosis not present

## 2022-10-13 DIAGNOSIS — I272 Pulmonary hypertension, unspecified: Secondary | ICD-10-CM | POA: Diagnosis not present

## 2022-10-13 DIAGNOSIS — Z7984 Long term (current) use of oral hypoglycemic drugs: Secondary | ICD-10-CM | POA: Diagnosis not present

## 2022-10-13 DIAGNOSIS — E1122 Type 2 diabetes mellitus with diabetic chronic kidney disease: Secondary | ICD-10-CM | POA: Diagnosis not present

## 2022-10-13 DIAGNOSIS — I130 Hypertensive heart and chronic kidney disease with heart failure and stage 1 through stage 4 chronic kidney disease, or unspecified chronic kidney disease: Secondary | ICD-10-CM | POA: Diagnosis not present

## 2022-10-13 DIAGNOSIS — N184 Chronic kidney disease, stage 4 (severe): Secondary | ICD-10-CM | POA: Diagnosis not present

## 2022-10-13 DIAGNOSIS — I5031 Acute diastolic (congestive) heart failure: Secondary | ICD-10-CM | POA: Diagnosis not present

## 2022-10-18 DIAGNOSIS — E1122 Type 2 diabetes mellitus with diabetic chronic kidney disease: Secondary | ICD-10-CM | POA: Diagnosis not present

## 2022-10-18 DIAGNOSIS — Z466 Encounter for fitting and adjustment of urinary device: Secondary | ICD-10-CM | POA: Diagnosis not present

## 2022-10-18 DIAGNOSIS — Z7901 Long term (current) use of anticoagulants: Secondary | ICD-10-CM | POA: Diagnosis not present

## 2022-10-18 DIAGNOSIS — I272 Pulmonary hypertension, unspecified: Secondary | ICD-10-CM | POA: Diagnosis not present

## 2022-10-18 DIAGNOSIS — I2489 Other forms of acute ischemic heart disease: Secondary | ICD-10-CM | POA: Diagnosis not present

## 2022-10-18 DIAGNOSIS — Z7984 Long term (current) use of oral hypoglycemic drugs: Secondary | ICD-10-CM | POA: Diagnosis not present

## 2022-10-18 DIAGNOSIS — I5031 Acute diastolic (congestive) heart failure: Secondary | ICD-10-CM | POA: Diagnosis not present

## 2022-10-18 DIAGNOSIS — Z7985 Long-term (current) use of injectable non-insulin antidiabetic drugs: Secondary | ICD-10-CM | POA: Diagnosis not present

## 2022-10-18 DIAGNOSIS — D6832 Hemorrhagic disorder due to extrinsic circulating anticoagulants: Secondary | ICD-10-CM | POA: Diagnosis not present

## 2022-10-18 DIAGNOSIS — E039 Hypothyroidism, unspecified: Secondary | ICD-10-CM | POA: Diagnosis not present

## 2022-10-18 DIAGNOSIS — I480 Paroxysmal atrial fibrillation: Secondary | ICD-10-CM | POA: Diagnosis not present

## 2022-10-18 DIAGNOSIS — E785 Hyperlipidemia, unspecified: Secondary | ICD-10-CM | POA: Diagnosis not present

## 2022-10-18 DIAGNOSIS — I130 Hypertensive heart and chronic kidney disease with heart failure and stage 1 through stage 4 chronic kidney disease, or unspecified chronic kidney disease: Secondary | ICD-10-CM | POA: Diagnosis not present

## 2022-10-18 DIAGNOSIS — N184 Chronic kidney disease, stage 4 (severe): Secondary | ICD-10-CM | POA: Diagnosis not present

## 2022-10-19 ENCOUNTER — Ambulatory Visit: Payer: Medicare Other | Admitting: Cardiovascular Disease

## 2022-10-23 DIAGNOSIS — Z7901 Long term (current) use of anticoagulants: Secondary | ICD-10-CM | POA: Diagnosis not present

## 2022-10-23 DIAGNOSIS — E1122 Type 2 diabetes mellitus with diabetic chronic kidney disease: Secondary | ICD-10-CM | POA: Diagnosis not present

## 2022-10-23 DIAGNOSIS — Z466 Encounter for fitting and adjustment of urinary device: Secondary | ICD-10-CM | POA: Diagnosis not present

## 2022-10-23 DIAGNOSIS — I480 Paroxysmal atrial fibrillation: Secondary | ICD-10-CM | POA: Diagnosis not present

## 2022-10-23 DIAGNOSIS — I130 Hypertensive heart and chronic kidney disease with heart failure and stage 1 through stage 4 chronic kidney disease, or unspecified chronic kidney disease: Secondary | ICD-10-CM | POA: Diagnosis not present

## 2022-10-23 DIAGNOSIS — I5031 Acute diastolic (congestive) heart failure: Secondary | ICD-10-CM | POA: Diagnosis not present

## 2022-10-31 DIAGNOSIS — I130 Hypertensive heart and chronic kidney disease with heart failure and stage 1 through stage 4 chronic kidney disease, or unspecified chronic kidney disease: Secondary | ICD-10-CM | POA: Diagnosis not present

## 2022-10-31 DIAGNOSIS — I480 Paroxysmal atrial fibrillation: Secondary | ICD-10-CM | POA: Diagnosis not present

## 2022-10-31 DIAGNOSIS — E1122 Type 2 diabetes mellitus with diabetic chronic kidney disease: Secondary | ICD-10-CM | POA: Diagnosis not present

## 2022-10-31 DIAGNOSIS — I5031 Acute diastolic (congestive) heart failure: Secondary | ICD-10-CM | POA: Diagnosis not present

## 2022-10-31 DIAGNOSIS — Z466 Encounter for fitting and adjustment of urinary device: Secondary | ICD-10-CM | POA: Diagnosis not present

## 2022-10-31 DIAGNOSIS — Z7901 Long term (current) use of anticoagulants: Secondary | ICD-10-CM | POA: Diagnosis not present

## 2022-11-02 ENCOUNTER — Telehealth: Payer: Self-pay | Admitting: *Deleted

## 2022-11-02 NOTE — Progress Notes (Signed)
  Health [employer] Plan  Note   11/02/2022 Name: Tiffany [name] MRN: [id number] DOB: 1931-12-27  Tiffany [name] is a 89 y.o. year old female who sees Tally Joe, MD for primary care. I reached out to [facility] by phone today to offer care coordination services.  Ms. Obrion was given information about Care Coordination services today including:   The Care Coordination services include support from the care team which includes your Nurse Coordinator, Clinical Social Worker, or Pharmacist.  The Care Coordination team is here to help remove barriers to the health concerns and goals most important to you. Care Coordination services are voluntary, and the patient may decline or stop services at any time by request to their care team member.   Care Coordination Consent Status: Patient agreed to services and verbal consent obtained.   Follow up plan:  Telephone appointment with care coordination team member scheduled for:  11/10/22  Encounter Outcome:  Patient Scheduled [facility] Coordination Care Guide  Direct Dial: [phone]

## 2022-11-06 DIAGNOSIS — N184 Chronic kidney disease, stage 4 (severe): Secondary | ICD-10-CM | POA: Diagnosis not present

## 2022-11-06 DIAGNOSIS — I509 Heart failure, unspecified: Secondary | ICD-10-CM | POA: Diagnosis not present

## 2022-11-06 DIAGNOSIS — D631 Anemia in chronic kidney disease: Secondary | ICD-10-CM | POA: Diagnosis not present

## 2022-11-06 DIAGNOSIS — I129 Hypertensive chronic kidney disease with stage 1 through stage 4 chronic kidney disease, or unspecified chronic kidney disease: Secondary | ICD-10-CM | POA: Diagnosis not present

## 2022-11-06 DIAGNOSIS — F0390 Unspecified dementia without behavioral disturbance: Secondary | ICD-10-CM | POA: Diagnosis not present

## 2022-11-06 DIAGNOSIS — E1122 Type 2 diabetes mellitus with diabetic chronic kidney disease: Secondary | ICD-10-CM | POA: Diagnosis not present

## 2022-11-06 DIAGNOSIS — I130 Hypertensive heart and chronic kidney disease with heart failure and stage 1 through stage 4 chronic kidney disease, or unspecified chronic kidney disease: Secondary | ICD-10-CM | POA: Diagnosis not present

## 2022-11-06 DIAGNOSIS — I5031 Acute diastolic (congestive) heart failure: Secondary | ICD-10-CM | POA: Diagnosis not present

## 2022-11-06 DIAGNOSIS — N2581 Secondary hyperparathyroidism of renal origin: Secondary | ICD-10-CM | POA: Diagnosis not present

## 2022-11-06 DIAGNOSIS — Z466 Encounter for fitting and adjustment of urinary device: Secondary | ICD-10-CM | POA: Diagnosis not present

## 2022-11-06 DIAGNOSIS — N189 Chronic kidney disease, unspecified: Secondary | ICD-10-CM | POA: Diagnosis not present

## 2022-11-06 DIAGNOSIS — Z7901 Long term (current) use of anticoagulants: Secondary | ICD-10-CM | POA: Diagnosis not present

## 2022-11-06 DIAGNOSIS — I480 Paroxysmal atrial fibrillation: Secondary | ICD-10-CM | POA: Diagnosis not present

## 2022-11-10 ENCOUNTER — Ambulatory Visit: Payer: Self-pay | Admitting: *Deleted

## 2022-11-10 ENCOUNTER — Encounter: Payer: Self-pay | Admitting: *Deleted

## 2022-11-10 NOTE — Patient Outreach (Signed)
Care Coordination   Initial Visit Note   11/10/2022 Name: Tiffany [name] MRN: [id number] DOB: 07-Jun-1931  Tiffany [name] is a 89 y.o. year old female who sees Tally Joe, MD for primary care. I spoke with  Deatra Canter Gessel and daughter Okey Dupre by phone today.  What matters to the patients health and wellness today?  Remain stable and able to live independently with more support in the home. Will have BSW follow up for possible PCS applications    Goals Addressed             This Visit's Progress    Effective management of chronic medical conditions       Interventions Today    Flowsheet Row Most Recent Value  Chronic Disease   Chronic disease during today's visit Diabetes, Congestive Heart Failure (CHF), Atrial Fibrillation (AFib), Hypertension (HTN)  General Interventions   General Interventions Discussed/Reviewed General Interventions Reviewed, Annual Eye Exam, Labs, Annual Foot Exam, Doctor Visits, Community Resources  Labs Hgb A1c every 3 months  [6.7]  Doctor Visits Discussed/Reviewed Doctor Visits Reviewed, PCP, Specialist  [upcoming: cardiology and eye 9/17, PCP 9/30]  PCP/Specialist Visits Compliance with follow-up visit  Exercise Interventions   Exercise Discussed/Reviewed Physical Activity, Exercise Discussed  Physical Activity Discussed/Reviewed Physical Activity Discussed  [Does not have routine physical activity, uses walker for mobility, she is a fall risk]  Education Interventions   Education Provided Provided Education  Provided Verbal Education On Blood Sugar Monitoring, Eye Care, Foot Care, Labs, Nutrition, When to see the doctor, Medication, Community Resources  Labs Reviewed Hgb A1c  Mental Health Interventions   Mental Health Discussed/Reviewed Mental Health Discussed, Coping Strategies  Nutrition Interventions   Nutrition Discussed/Reviewed Nutrition Discussed, Adding fruits and vegetables, Decreasing sugar intake, Increasing  proteins, Decreasing salt  [Does not cook much, family and friends prepare meals for her.  On wait list for mobile meals]  Pharmacy Interventions   Pharmacy Dicussed/Reviewed Pharmacy Topics Discussed, Affording Medications  Safety Interventions   Safety Discussed/Reviewed Safety Discussed, Fall Risk  [uses walker, does not have life alert]  Advanced Directive Interventions   Advanced Directives Discussed/Reviewed Advanced Directives Discussed, Advanced Care Planning, End of Life  End of Life Palliative  [active with [facility], visits every month, may be on the verge of transitioning to hospice]              SDOH assessments and interventions completed:  Yes  SDOH Interventions Today    Flowsheet Row Most Recent Value  SDOH Interventions   Food Insecurity Interventions Intervention Not Indicated  Housing Interventions Intervention Not Indicated  Transportation Interventions Intervention Not Indicated  Utilities Interventions Intervention Not Indicated        Care Coordination Interventions:  Yes, provided   Follow up plan: Follow up call scheduled for 10/21 for home visit    Encounter Outcome:  Patient Visit Completed   Kemper Durie, RN, MSN, [facility] Care Management Care Management Coordinator [phone]

## 2022-11-10 NOTE — Patient Instructions (Signed)
Visit Information  Thank you for taking time to visit with me today. Please don't hesitate to contact me if I can be of assistance to you before our next scheduled telephone appointment.  Following are the goals we discussed today:  Continue monitoring blood sugar, blood pressure, and weights daily.   Our next appointment is  in person at your home  on 10/21 at 1pm  Please call the care guide team at [phone] if you need to cancel or reschedule your appointment.   Please call the Suicide and Crisis Lifeline: 988 call the [place] National Suicide Prevention Lifeline: (639)725-6306 or TTY: 938-689-0298 TTY 519-699-7927) to talk to a trained counselor call 1-800-273-TALK (toll free, 24 hour hotline) call 911 if you are experiencing a Mental Health or Behavioral Health Crisis or need someone to talk to.  Patient verbalizes understanding of instructions and care plan provided today and agrees to view in MyChart. Active MyChart status and patient understanding of how to access instructions and care plan via MyChart confirmed with patient.     The patient has been provided with contact information for the care management team and has been advised to call with any health related questions or concerns.   Kemper Durie,  RN, MSN, [facility] Care Management Care Management Coordinator [phone]

## 2022-11-14 ENCOUNTER — Ambulatory Visit: Payer: Medicare Other | Attending: Cardiovascular Disease | Admitting: Cardiovascular Disease

## 2022-11-14 ENCOUNTER — Encounter: Payer: Self-pay | Admitting: Cardiovascular Disease

## 2022-11-14 VITALS — BP 116/58 | HR 82 | Ht 60.0 in | Wt 156.0 lb

## 2022-11-14 DIAGNOSIS — I480 Paroxysmal atrial fibrillation: Secondary | ICD-10-CM | POA: Insufficient documentation

## 2022-11-14 DIAGNOSIS — Z7901 Long term (current) use of anticoagulants: Secondary | ICD-10-CM | POA: Diagnosis not present

## 2022-11-14 DIAGNOSIS — E1122 Type 2 diabetes mellitus with diabetic chronic kidney disease: Secondary | ICD-10-CM | POA: Diagnosis not present

## 2022-11-14 DIAGNOSIS — Z466 Encounter for fitting and adjustment of urinary device: Secondary | ICD-10-CM | POA: Diagnosis not present

## 2022-11-14 DIAGNOSIS — I130 Hypertensive heart and chronic kidney disease with heart failure and stage 1 through stage 4 chronic kidney disease, or unspecified chronic kidney disease: Secondary | ICD-10-CM | POA: Diagnosis not present

## 2022-11-14 DIAGNOSIS — I5031 Acute diastolic (congestive) heart failure: Secondary | ICD-10-CM | POA: Diagnosis not present

## 2022-11-14 NOTE — Progress Notes (Signed)
Electrophysiology Office Note:    Date:  11/14/2022   ID:  Tiffany [name], DOB August 18, 1931, MRN [id number]  PCP:  Tally Joe, MD   [facility] Providers Cardiologist:  None     Referring MD: Tally Joe, MD   History of Present Illness:    Tiffany [name] is a 89 y.o. female with a medical history significant for atrial fibrillation with tachybradycardia syndrome status post placement of a Medtronic dual-chamber pacemaker who presents for EP follow-up.     Medtronic dual-chamber pacemaker placed for tachybradycardia syndrome.     she has no device related complaints -- no new tenderness, drainage, redness.   EKGs/Labs/Other Studies Reviewed Today:    Echocardiogram:  TTE August 09, 2022 Ejection fraction 60 to 65%.  Severe asymmetric LVH of the inferior segment.  Severe bilateral atrial dilation.  Moderate mitral regurgitation.  Mild to moderate mitral stenosis.  Moderate aortic regurgitation.  Moderate aortic valve stenosis.    EKG:   EKG Interpretation Date/Time:  Tuesday November 14 2022 15:15:20 EDT Ventricular Rate:  82 PR Interval:  196 QRS Duration:  178 QT Interval:  462 QTC Calculation: 539 R Axis:   -59  Text Interpretation: AV dual-paced rhythm with occasional ventricular-paced complexes When compared with ECG of 09-Oct-2022 20:38, Vent. rate has increased BY  21 BPM Confirmed by York Pellant [phone]) on 11/14/2022 3:40:30 PM     Physical Exam:    VS:  BP (!) 116/58   Pulse 82   Ht 5' (1.524 m)   Wt 156 lb (70.8 kg)   SpO2 95%   BMI 30.47 kg/m     Wt Readings from Last 3 Encounters:  11/14/22 156 lb (70.8 kg)  10/09/22 159 lb (72.1 kg)  08/12/22 158 lb 8.2 oz (71.9 kg)     GEN:  Well nourished, well developed in no acute distress CARDIAC: RRR The device site is normal -- no tenderness, edema, drainage, redness, threatened erosion.  RESPIRATORY:  Normal work of breathing MUSCULOSKELETAL: trace  edema    ASSESSMENT & PLAN:    Medtronic dual-chamber pacemaker Placed interrogated today.  See paceArt for details Device is functioning normally She is device dependent in clinic today  Paroxysmal atrial fibrillation CHA2DS2-VASc score is 7 Continue warfarin, managed by her primary medical doctor      Signed, Maurice Small, MD  11/14/2022 3:43 PM    [facility]

## 2022-11-14 NOTE — Patient Instructions (Signed)
Medication Instructions:  Your physician recommends that you continue on your current medications as directed. Please refer to the Current Medication list given to you today. *If you need a refill on your cardiac medications before your next appointment, please call your pharmacy*   Follow-Up: At [facility], you and your health needs are our priority.  As part of our continuing mission to provide you with exceptional heart care, we have created designated Provider Care Teams.  These Care Teams include your primary Cardiologist (physician) and Advanced Practice Providers (APPs -  Physician Assistants and Nurse Practitioners) who all work together to provide you with the care you need, when you need it.  We recommend signing up for the patient portal called "MyChart".  Sign up information is provided on this After Visit Summary.  MyChart is used to connect with patients for Virtual Visits (Telemedicine).  Patients are able to view lab/test results, encounter notes, upcoming appointments, etc.  Non-urgent messages can be sent to your provider as well.   To learn more about what you can do with MyChart, go to [email or web address].    Your next appointment:   1 year(s)  Provider:   You will see one of the following Advanced Practice Providers on your designated Care Team:   Francis Dowse, Charlott Holler [address]" [place], [place] Sherie Don, NP Canary Brim, NP

## 2022-11-16 DIAGNOSIS — H35371 Puckering of macula, right eye: Secondary | ICD-10-CM | POA: Diagnosis not present

## 2022-11-17 DIAGNOSIS — E1122 Type 2 diabetes mellitus with diabetic chronic kidney disease: Secondary | ICD-10-CM | POA: Diagnosis not present

## 2022-11-17 DIAGNOSIS — I480 Paroxysmal atrial fibrillation: Secondary | ICD-10-CM | POA: Diagnosis not present

## 2022-11-17 DIAGNOSIS — I5031 Acute diastolic (congestive) heart failure: Secondary | ICD-10-CM | POA: Diagnosis not present

## 2022-11-17 DIAGNOSIS — Z7901 Long term (current) use of anticoagulants: Secondary | ICD-10-CM | POA: Diagnosis not present

## 2022-11-17 DIAGNOSIS — Z7984 Long term (current) use of oral hypoglycemic drugs: Secondary | ICD-10-CM | POA: Diagnosis not present

## 2022-11-17 DIAGNOSIS — D6832 Hemorrhagic disorder due to extrinsic circulating anticoagulants: Secondary | ICD-10-CM | POA: Diagnosis not present

## 2022-11-17 DIAGNOSIS — N184 Chronic kidney disease, stage 4 (severe): Secondary | ICD-10-CM | POA: Diagnosis not present

## 2022-11-17 DIAGNOSIS — E039 Hypothyroidism, unspecified: Secondary | ICD-10-CM | POA: Diagnosis not present

## 2022-11-17 DIAGNOSIS — Z466 Encounter for fitting and adjustment of urinary device: Secondary | ICD-10-CM | POA: Diagnosis not present

## 2022-11-17 DIAGNOSIS — I272 Pulmonary hypertension, unspecified: Secondary | ICD-10-CM | POA: Diagnosis not present

## 2022-11-17 DIAGNOSIS — I2489 Other forms of acute ischemic heart disease: Secondary | ICD-10-CM | POA: Diagnosis not present

## 2022-11-17 DIAGNOSIS — Z7985 Long-term (current) use of injectable non-insulin antidiabetic drugs: Secondary | ICD-10-CM | POA: Diagnosis not present

## 2022-11-17 DIAGNOSIS — I130 Hypertensive heart and chronic kidney disease with heart failure and stage 1 through stage 4 chronic kidney disease, or unspecified chronic kidney disease: Secondary | ICD-10-CM | POA: Diagnosis not present

## 2022-11-17 DIAGNOSIS — E785 Hyperlipidemia, unspecified: Secondary | ICD-10-CM | POA: Diagnosis not present

## 2022-11-22 DIAGNOSIS — Z466 Encounter for fitting and adjustment of urinary device: Secondary | ICD-10-CM | POA: Diagnosis not present

## 2022-11-22 DIAGNOSIS — I130 Hypertensive heart and chronic kidney disease with heart failure and stage 1 through stage 4 chronic kidney disease, or unspecified chronic kidney disease: Secondary | ICD-10-CM | POA: Diagnosis not present

## 2022-11-22 DIAGNOSIS — E1122 Type 2 diabetes mellitus with diabetic chronic kidney disease: Secondary | ICD-10-CM | POA: Diagnosis not present

## 2022-11-22 DIAGNOSIS — I5031 Acute diastolic (congestive) heart failure: Secondary | ICD-10-CM | POA: Diagnosis not present

## 2022-11-22 DIAGNOSIS — I480 Paroxysmal atrial fibrillation: Secondary | ICD-10-CM | POA: Diagnosis not present

## 2022-11-22 DIAGNOSIS — Z7901 Long term (current) use of anticoagulants: Secondary | ICD-10-CM | POA: Diagnosis not present

## 2022-11-27 DIAGNOSIS — E039 Hypothyroidism, unspecified: Secondary | ICD-10-CM | POA: Diagnosis not present

## 2022-11-27 DIAGNOSIS — I129 Hypertensive chronic kidney disease with stage 1 through stage 4 chronic kidney disease, or unspecified chronic kidney disease: Secondary | ICD-10-CM | POA: Diagnosis not present

## 2022-11-27 DIAGNOSIS — D649 Anemia, unspecified: Secondary | ICD-10-CM | POA: Diagnosis not present

## 2022-11-27 DIAGNOSIS — I480 Paroxysmal atrial fibrillation: Secondary | ICD-10-CM | POA: Diagnosis not present

## 2022-11-27 DIAGNOSIS — E1169 Type 2 diabetes mellitus with other specified complication: Secondary | ICD-10-CM | POA: Diagnosis not present

## 2022-11-27 DIAGNOSIS — I5032 Chronic diastolic (congestive) heart failure: Secondary | ICD-10-CM | POA: Diagnosis not present

## 2022-11-27 DIAGNOSIS — R5381 Other malaise: Secondary | ICD-10-CM | POA: Diagnosis not present

## 2022-11-27 DIAGNOSIS — E7800 Pure hypercholesterolemia, unspecified: Secondary | ICD-10-CM | POA: Diagnosis not present

## 2022-11-27 DIAGNOSIS — Z23 Encounter for immunization: Secondary | ICD-10-CM | POA: Diagnosis not present

## 2022-11-27 DIAGNOSIS — D6869 Other thrombophilia: Secondary | ICD-10-CM | POA: Diagnosis not present

## 2022-11-27 DIAGNOSIS — N184 Chronic kidney disease, stage 4 (severe): Secondary | ICD-10-CM | POA: Diagnosis not present

## 2022-11-27 DIAGNOSIS — D696 Thrombocytopenia, unspecified: Secondary | ICD-10-CM | POA: Diagnosis not present

## 2022-11-27 DIAGNOSIS — N3946 Mixed incontinence: Secondary | ICD-10-CM | POA: Diagnosis not present

## 2022-11-28 DIAGNOSIS — I130 Hypertensive heart and chronic kidney disease with heart failure and stage 1 through stage 4 chronic kidney disease, or unspecified chronic kidney disease: Secondary | ICD-10-CM | POA: Diagnosis not present

## 2022-11-28 DIAGNOSIS — Z7901 Long term (current) use of anticoagulants: Secondary | ICD-10-CM | POA: Diagnosis not present

## 2022-11-28 DIAGNOSIS — E1122 Type 2 diabetes mellitus with diabetic chronic kidney disease: Secondary | ICD-10-CM | POA: Diagnosis not present

## 2022-11-28 DIAGNOSIS — Z466 Encounter for fitting and adjustment of urinary device: Secondary | ICD-10-CM | POA: Diagnosis not present

## 2022-11-28 DIAGNOSIS — I480 Paroxysmal atrial fibrillation: Secondary | ICD-10-CM | POA: Diagnosis not present

## 2022-11-28 DIAGNOSIS — I5031 Acute diastolic (congestive) heart failure: Secondary | ICD-10-CM | POA: Diagnosis not present

## 2022-12-05 ENCOUNTER — Encounter: Payer: Medicare Other | Admitting: *Deleted

## 2022-12-05 DIAGNOSIS — I5031 Acute diastolic (congestive) heart failure: Secondary | ICD-10-CM | POA: Diagnosis not present

## 2022-12-05 DIAGNOSIS — Z466 Encounter for fitting and adjustment of urinary device: Secondary | ICD-10-CM | POA: Diagnosis not present

## 2022-12-05 DIAGNOSIS — I480 Paroxysmal atrial fibrillation: Secondary | ICD-10-CM | POA: Diagnosis not present

## 2022-12-05 DIAGNOSIS — E1122 Type 2 diabetes mellitus with diabetic chronic kidney disease: Secondary | ICD-10-CM | POA: Diagnosis not present

## 2022-12-05 DIAGNOSIS — Z7901 Long term (current) use of anticoagulants: Secondary | ICD-10-CM | POA: Diagnosis not present

## 2022-12-05 DIAGNOSIS — I130 Hypertensive heart and chronic kidney disease with heart failure and stage 1 through stage 4 chronic kidney disease, or unspecified chronic kidney disease: Secondary | ICD-10-CM | POA: Diagnosis not present

## 2022-12-12 DIAGNOSIS — I5031 Acute diastolic (congestive) heart failure: Secondary | ICD-10-CM | POA: Diagnosis not present

## 2022-12-12 DIAGNOSIS — I130 Hypertensive heart and chronic kidney disease with heart failure and stage 1 through stage 4 chronic kidney disease, or unspecified chronic kidney disease: Secondary | ICD-10-CM | POA: Diagnosis not present

## 2022-12-12 DIAGNOSIS — Z466 Encounter for fitting and adjustment of urinary device: Secondary | ICD-10-CM | POA: Diagnosis not present

## 2022-12-12 DIAGNOSIS — I480 Paroxysmal atrial fibrillation: Secondary | ICD-10-CM | POA: Diagnosis not present

## 2022-12-12 DIAGNOSIS — Z7901 Long term (current) use of anticoagulants: Secondary | ICD-10-CM | POA: Diagnosis not present

## 2022-12-12 DIAGNOSIS — E1122 Type 2 diabetes mellitus with diabetic chronic kidney disease: Secondary | ICD-10-CM | POA: Diagnosis not present

## 2022-12-17 DIAGNOSIS — I5031 Acute diastolic (congestive) heart failure: Secondary | ICD-10-CM | POA: Diagnosis not present

## 2022-12-17 DIAGNOSIS — E1122 Type 2 diabetes mellitus with diabetic chronic kidney disease: Secondary | ICD-10-CM | POA: Diagnosis not present

## 2022-12-17 DIAGNOSIS — N184 Chronic kidney disease, stage 4 (severe): Secondary | ICD-10-CM | POA: Diagnosis not present

## 2022-12-17 DIAGNOSIS — E039 Hypothyroidism, unspecified: Secondary | ICD-10-CM | POA: Diagnosis not present

## 2022-12-17 DIAGNOSIS — Z7901 Long term (current) use of anticoagulants: Secondary | ICD-10-CM | POA: Diagnosis not present

## 2022-12-17 DIAGNOSIS — I480 Paroxysmal atrial fibrillation: Secondary | ICD-10-CM | POA: Diagnosis not present

## 2022-12-17 DIAGNOSIS — Z7984 Long term (current) use of oral hypoglycemic drugs: Secondary | ICD-10-CM | POA: Diagnosis not present

## 2022-12-17 DIAGNOSIS — Z7985 Long-term (current) use of injectable non-insulin antidiabetic drugs: Secondary | ICD-10-CM | POA: Diagnosis not present

## 2022-12-17 DIAGNOSIS — I2489 Other forms of acute ischemic heart disease: Secondary | ICD-10-CM | POA: Diagnosis not present

## 2022-12-17 DIAGNOSIS — Z466 Encounter for fitting and adjustment of urinary device: Secondary | ICD-10-CM | POA: Diagnosis not present

## 2022-12-17 DIAGNOSIS — E785 Hyperlipidemia, unspecified: Secondary | ICD-10-CM | POA: Diagnosis not present

## 2022-12-17 DIAGNOSIS — D6832 Hemorrhagic disorder due to extrinsic circulating anticoagulants: Secondary | ICD-10-CM | POA: Diagnosis not present

## 2022-12-17 DIAGNOSIS — I130 Hypertensive heart and chronic kidney disease with heart failure and stage 1 through stage 4 chronic kidney disease, or unspecified chronic kidney disease: Secondary | ICD-10-CM | POA: Diagnosis not present

## 2022-12-17 DIAGNOSIS — I272 Pulmonary hypertension, unspecified: Secondary | ICD-10-CM | POA: Diagnosis not present

## 2022-12-18 ENCOUNTER — Ambulatory Visit: Payer: Self-pay | Admitting: *Deleted

## 2022-12-18 DIAGNOSIS — E1169 Type 2 diabetes mellitus with other specified complication: Secondary | ICD-10-CM

## 2022-12-18 NOTE — Patient Outreach (Signed)
Care Coordination   Follow Up Visit Note   12/19/2022 Name: Sylvie Etheridge MRN: [id number] DOB: [birth year]  Mayana Duboise is a 89 y.o. year old female who sees Tally Joe, MD for primary care. I spoke with  Deatra Canter Sciortino in person, daughter and son in law both present.  What matters to the patients health and wellness today?  Optimizing in home assistance to keep patient in the home as long as possible.     Goals Addressed             This Visit's Progress    Effective management of chronic medical conditions   On track    Interventions Today    Flowsheet Row Most Recent Value  Chronic Disease   Chronic disease during today's visit Diabetes, Atrial Fibrillation (AFib), Congestive Heart Failure (CHF)  General Interventions   General Interventions Discussed/Reviewed General Interventions Reviewed, Vaccines, Doctor Visits, Communication with, [employer]  [Discussed mobile meals and [employer] services.  Referrals placed to care guide and SW]  Vaccines Flu  Doctor Visits Discussed/Reviewed Doctor Visits Reviewed, PCP, Specialist  [upcoming with urology on 10/31]  PCP/Specialist Visits Compliance with follow-up visit  Communication with Social Work  Exercise Interventions   Exercise Discussed/Reviewed Weight Managment  Weight Management Weight maintenance  Education Interventions   Education Provided Provided Education  Provided Verbal Education On Nutrition, Medication, When to see the doctor, Blood Sugar Monitoring  Pharmacy Interventions   Pharmacy Dicussed/Reviewed Pharmacy Topics Reviewed  [medications delivered prepacked by [facility]]  Advanced Directive Interventions   Advanced Directives Discussed/Reviewed Advanced Directives Reviewed, End of Life  End of Life Palliative  [Active with [facility], home visits monthly]              SDOH assessments and interventions completed:  No     Care Coordination Interventions:  Yes, provided    Follow up plan: Follow up call scheduled for 11/18    Encounter Outcome:  Patient Visit Completed   Kemper Durie RN, MSN, CCM [place]  [facility], [facility] RN Care Coordinator Direct Dial: [phone] / Main [phone] Fax [phone] Email: [email or web address] Website: [email or web address]

## 2022-12-19 ENCOUNTER — Ambulatory Visit (INDEPENDENT_AMBULATORY_CARE_PROVIDER_SITE_OTHER): Payer: Medicare Other

## 2022-12-19 DIAGNOSIS — I495 Sick sinus syndrome: Secondary | ICD-10-CM | POA: Diagnosis not present

## 2022-12-19 DIAGNOSIS — I480 Paroxysmal atrial fibrillation: Secondary | ICD-10-CM

## 2022-12-20 LAB — CUP PACEART REMOTE DEVICE CHECK
Battery Remaining Longevity: 46 mo
Battery Voltage: 2.95 V
Brady Statistic AP VP Percent: 99.73 %
Brady Statistic AP VS Percent: 0 %
Brady Statistic AS VP Percent: 0 %
Brady Statistic AS VS Percent: 0.26 %
Brady Statistic RA Percent Paced: 100 %
Brady Statistic RV Percent Paced: 99.73 %
Date Time Interrogation Session: 20241021214002
Implantable Lead Connection Status: 753985
Implantable Lead Connection Status: 753985
Implantable Lead Implant Date: 19990907
Implantable Lead Implant Date: 19990907
Implantable Lead Location: 753859
Implantable Lead Location: 753860
Implantable Lead Serial Number: 32904
Implantable Lead Serial Number: 33421
Implantable Pulse Generator Implant Date: 20211014
Lead Channel Impedance Value: 228 Ohm
Lead Channel Impedance Value: 247 Ohm
Lead Channel Impedance Value: 285 Ohm
Lead Channel Impedance Value: 304 Ohm
Lead Channel Pacing Threshold Amplitude: 1 V
Lead Channel Pacing Threshold Pulse Width: 0.4 ms
Lead Channel Sensing Intrinsic Amplitude: 12.625 mV
Lead Channel Sensing Intrinsic Amplitude: 12.625 mV
Lead Channel Setting Pacing Amplitude: 2 V
Lead Channel Setting Pacing Amplitude: 2.5 V
Lead Channel Setting Pacing Pulse Width: 0.4 ms
Lead Channel Setting Sensing Sensitivity: 8 mV
Zone Setting Status: 755011
Zone Setting Status: 755011

## 2022-12-24 DIAGNOSIS — I5031 Acute diastolic (congestive) heart failure: Secondary | ICD-10-CM | POA: Diagnosis not present

## 2022-12-24 DIAGNOSIS — I130 Hypertensive heart and chronic kidney disease with heart failure and stage 1 through stage 4 chronic kidney disease, or unspecified chronic kidney disease: Secondary | ICD-10-CM | POA: Diagnosis not present

## 2022-12-24 DIAGNOSIS — E1122 Type 2 diabetes mellitus with diabetic chronic kidney disease: Secondary | ICD-10-CM | POA: Diagnosis not present

## 2022-12-24 DIAGNOSIS — Z466 Encounter for fitting and adjustment of urinary device: Secondary | ICD-10-CM | POA: Diagnosis not present

## 2022-12-24 DIAGNOSIS — I480 Paroxysmal atrial fibrillation: Secondary | ICD-10-CM | POA: Diagnosis not present

## 2022-12-24 DIAGNOSIS — Z7901 Long term (current) use of anticoagulants: Secondary | ICD-10-CM | POA: Diagnosis not present

## 2022-12-25 ENCOUNTER — Ambulatory Visit: Payer: Self-pay

## 2022-12-25 NOTE — Patient Outreach (Deleted)
Care Coordination   Initial Visit Note   12/25/2022 Name: Tiffany [name] MRN: [id number] DOB: 1931/03/24  Deboah [name] is a 89 y.o. year old female who sees Tiffany Joe, MD for primary care. I spoke with  Tiffany [name] by phone today.  What matters to the patients health and wellness today?  Patient wants personal care in the home and has Medicaid.     Goals Addressed             This Visit's Progress    Care Coordination Activities       Interventions Today    Flowsheet Row Most Recent Value  Chronic Disease   Chronic disease during today's visit Diabetes, Congestive Heart Failure (CHF), Hypertension (HTN), Atrial Fibrillation (AFib)  General Interventions   General Interventions Discussed/Reviewed General Interventions Discussed, General Interventions Reviewed, Level of Care  [Pt request personal care and needs help w/bathing,cleaning,dressing, meal prep, and rx. Has Medicaid, $60 foodstamps and [employer]. Friend transports as wait time is an issue w/Medicaid. Sw will forward NCLIFTS paperwork to provider.]              SDOH assessments and interventions completed:  Yes  SDOH Interventions Today    Flowsheet Row Most Recent Value  SDOH Interventions   Food Insecurity Interventions Intervention Not Indicated  Housing Interventions Intervention Not Indicated  Transportation Interventions Intervention Not Indicated, Other (Comment)  [Daughter provides transportation]  Utilities Interventions Intervention Not Indicated        Care Coordination Interventions:  Yes, provided   Follow up plan: Follow up call scheduled for 01/01/23 at 2:30    Encounter Outcome:  Patient Visit Completed

## 2022-12-25 NOTE — Patient Instructions (Addendum)
Visit Information  Thank you for taking time to visit with me today. Please don't hesitate to contact me if I can be of assistance to you.   Following are the goals we discussed today:  SW will forward paperwork to provider for [facility] services.  Our next appointment is by telephone on 01/01/23 at 11:00am  Please call the care guide team at [phone] if you need to cancel or reschedule your appointment.   If you are experiencing a Mental Health or Behavioral Health Crisis or need someone to talk to, please call 911  Patient verbalizes understanding of instructions and care plan provided today and agrees to view in MyChart. Active MyChart status and patient understanding of how to access instructions and care plan via MyChart confirmed with patient.     Telephone follow up appointment with care management team member scheduled for: 11/04/241:00am  Lysle Morales, BSW Social Worker [phone]

## 2022-12-25 NOTE — Patient Outreach (Signed)
Care Coordination   Follow Up Visit Note   12/25/2022 Name: Tiffany [name] MRN: [id number] DOB: May 07, 1931  Tiffany [name] is a 89 y.o. year old female who sees Tally Joe, MD for primary care. I spoke with  Tiffany [name] by phone today.  What matters to the patients health and wellness today?  Patient needs PCS services and has access to Medicaid.    Goals Addressed             This Visit's Progress    Care Coordination Activities       Interventions Today    Flowsheet Row Most Recent Value  Chronic Disease   Chronic disease during today's visit Diabetes, Congestive Heart Failure (CHF), Atrial Fibrillation (AFib), Hypertension (HTN)  General Interventions   General Interventions Discussed/Reviewed General Interventions Discussed, General Interventions Reviewed, Level of Care  [Pt request PCS and has active medicaid. Needs help with getting in the shower, garbag, cooking and changing linen. Daughter Tiffany [name] reports Fish farm manager SW is working on [employer]. Pt rx is high and recommends providing Medicaid card.]  Level of Care Personal Care Services  [SW will submit forms for PCS to provider for review and completion.]  Education Interventions   Education Provided Provided Education  [Educated patient on PCS initial process.]              SDOH assessments and interventions completed:  Yes  SDOH Interventions Today    Flowsheet Row Most Recent Value  SDOH Interventions   Food Insecurity Interventions Intervention Not Indicated  Housing Interventions Intervention Not Indicated  Transportation Interventions Intervention Not Indicated, Other (Comment)  [Daughter provides transportation]  Utilities Interventions Intervention Not Indicated        Care Coordination Interventions:  Yes, provided   Follow up plan: Follow up call scheduled for 01/01/23 11am    Encounter Outcome:  Patient Visit Completed

## 2022-12-28 DIAGNOSIS — R339 Retention of urine, unspecified: Secondary | ICD-10-CM | POA: Diagnosis not present

## 2022-12-28 DIAGNOSIS — N3000 Acute cystitis without hematuria: Secondary | ICD-10-CM | POA: Diagnosis not present

## 2023-01-01 ENCOUNTER — Ambulatory Visit: Payer: Self-pay

## 2023-01-01 NOTE — Patient Outreach (Signed)
  Care Coordination   Follow Up Visit Note   01/01/2023 Name: Kenesha Moshier MRN: [id number] DOB: 01-Apr-1931  Alie Moudy is a 89 y.o. year old female who sees Tally Joe, MD for primary care. I spoke with  Deatra Canter Gebbia by phone today.  What matters to the patients health and wellness today?  [facility]  confirm receiving PCS forms and faxed to requested number.    Goals Addressed             This Visit's Progress    Care Coordination Activities       Interventions Today    Flowsheet Row Most Recent Value  General Interventions   General Interventions Discussed/Reviewed General Interventions Discussed, Communication with, General Interventions Reviewed  Communication with PCP/Specialists  [T/c from [facility] that the [facility] forms were received and faxed back on 10/31. 2nd copy of forms will be shredded.]              SDOH assessments and interventions completed:  No     Care Coordination Interventions:  Yes, provided   Follow up plan: No further intervention required.   Encounter Outcome:  Patient Visit Completed

## 2023-01-01 NOTE — Patient Outreach (Signed)
  Care Coordination   Follow Up Visit Note   01/01/2023 Name: Tiffany [name] MRN: [id number] DOB: January 29, 1932  Tiffany [name] is a 89 y.o. year old female who sees Tally Joe, MD for primary care. I spoke with  Tiffany [name] by phone today.  What matters to the patients health and wellness today?  Patient request PCS for in-home care.    Goals Addressed             This Visit's Progress    Care Coordination Activities       Interventions Today    Flowsheet Row Most Recent Value  General Interventions   General Interventions Discussed/Reviewed General Interventions Discussed, General Interventions Reviewed, Level of Care, Communication with  [Pt reports no updates on PCS services. SW agreed to follow up with the provider to confirm receipt.]  Communication with PCP/Specialists  [SW t/c Dr. Maurene Capes office and inform no fax received. SW resubmits fax but staff did not receive. Sw confirms sent fax receipt for 10/29 & 11/4. Sw left vm for Medical Records. SW to email form.]              SDOH assessments and interventions completed:  No     Care Coordination Interventions:  Yes, provided   Follow up plan: Follow up call scheduled for 01/08/23 at 11am    Encounter Outcome:  Patient Visit Completed

## 2023-01-04 DIAGNOSIS — R001 Bradycardia, unspecified: Secondary | ICD-10-CM | POA: Diagnosis not present

## 2023-01-04 DIAGNOSIS — I4891 Unspecified atrial fibrillation: Secondary | ICD-10-CM | POA: Diagnosis not present

## 2023-01-04 DIAGNOSIS — D631 Anemia in chronic kidney disease: Secondary | ICD-10-CM | POA: Diagnosis not present

## 2023-01-04 DIAGNOSIS — I5032 Chronic diastolic (congestive) heart failure: Secondary | ICD-10-CM | POA: Diagnosis not present

## 2023-01-04 DIAGNOSIS — Z950 Presence of cardiac pacemaker: Secondary | ICD-10-CM | POA: Diagnosis not present

## 2023-01-04 DIAGNOSIS — E1142 Type 2 diabetes mellitus with diabetic polyneuropathy: Secondary | ICD-10-CM | POA: Diagnosis not present

## 2023-01-04 DIAGNOSIS — K219 Gastro-esophageal reflux disease without esophagitis: Secondary | ICD-10-CM | POA: Diagnosis not present

## 2023-01-04 DIAGNOSIS — N2581 Secondary hyperparathyroidism of renal origin: Secondary | ICD-10-CM | POA: Diagnosis not present

## 2023-01-04 DIAGNOSIS — E673 Hypervitaminosis D: Secondary | ICD-10-CM | POA: Diagnosis not present

## 2023-01-04 DIAGNOSIS — Z960 Presence of urogenital implants: Secondary | ICD-10-CM | POA: Diagnosis not present

## 2023-01-04 DIAGNOSIS — E1140 Type 2 diabetes mellitus with diabetic neuropathy, unspecified: Secondary | ICD-10-CM | POA: Diagnosis not present

## 2023-01-04 DIAGNOSIS — I129 Hypertensive chronic kidney disease with stage 1 through stage 4 chronic kidney disease, or unspecified chronic kidney disease: Secondary | ICD-10-CM | POA: Diagnosis not present

## 2023-01-04 DIAGNOSIS — E785 Hyperlipidemia, unspecified: Secondary | ICD-10-CM | POA: Diagnosis not present

## 2023-01-04 DIAGNOSIS — N184 Chronic kidney disease, stage 4 (severe): Secondary | ICD-10-CM | POA: Diagnosis not present

## 2023-01-04 DIAGNOSIS — E039 Hypothyroidism, unspecified: Secondary | ICD-10-CM | POA: Diagnosis not present

## 2023-01-05 DIAGNOSIS — I129 Hypertensive chronic kidney disease with stage 1 through stage 4 chronic kidney disease, or unspecified chronic kidney disease: Secondary | ICD-10-CM | POA: Diagnosis not present

## 2023-01-05 DIAGNOSIS — I5032 Chronic diastolic (congestive) heart failure: Secondary | ICD-10-CM | POA: Diagnosis not present

## 2023-01-05 DIAGNOSIS — Z950 Presence of cardiac pacemaker: Secondary | ICD-10-CM | POA: Diagnosis not present

## 2023-01-05 DIAGNOSIS — N184 Chronic kidney disease, stage 4 (severe): Secondary | ICD-10-CM | POA: Diagnosis not present

## 2023-01-05 DIAGNOSIS — R001 Bradycardia, unspecified: Secondary | ICD-10-CM | POA: Diagnosis not present

## 2023-01-05 DIAGNOSIS — I4891 Unspecified atrial fibrillation: Secondary | ICD-10-CM | POA: Diagnosis not present

## 2023-01-05 NOTE — Progress Notes (Signed)
Remote pacemaker transmission.   

## 2023-01-06 DIAGNOSIS — N184 Chronic kidney disease, stage 4 (severe): Secondary | ICD-10-CM | POA: Diagnosis not present

## 2023-01-06 DIAGNOSIS — I129 Hypertensive chronic kidney disease with stage 1 through stage 4 chronic kidney disease, or unspecified chronic kidney disease: Secondary | ICD-10-CM | POA: Diagnosis not present

## 2023-01-06 DIAGNOSIS — I5032 Chronic diastolic (congestive) heart failure: Secondary | ICD-10-CM | POA: Diagnosis not present

## 2023-01-06 DIAGNOSIS — I4891 Unspecified atrial fibrillation: Secondary | ICD-10-CM | POA: Diagnosis not present

## 2023-01-06 DIAGNOSIS — Z950 Presence of cardiac pacemaker: Secondary | ICD-10-CM | POA: Diagnosis not present

## 2023-01-06 DIAGNOSIS — R001 Bradycardia, unspecified: Secondary | ICD-10-CM | POA: Diagnosis not present

## 2023-01-08 ENCOUNTER — Ambulatory Visit: Payer: Self-pay

## 2023-01-08 DIAGNOSIS — I129 Hypertensive chronic kidney disease with stage 1 through stage 4 chronic kidney disease, or unspecified chronic kidney disease: Secondary | ICD-10-CM | POA: Diagnosis not present

## 2023-01-08 DIAGNOSIS — I5032 Chronic diastolic (congestive) heart failure: Secondary | ICD-10-CM | POA: Diagnosis not present

## 2023-01-08 DIAGNOSIS — N184 Chronic kidney disease, stage 4 (severe): Secondary | ICD-10-CM | POA: Diagnosis not present

## 2023-01-08 DIAGNOSIS — Z950 Presence of cardiac pacemaker: Secondary | ICD-10-CM | POA: Diagnosis not present

## 2023-01-08 DIAGNOSIS — I4891 Unspecified atrial fibrillation: Secondary | ICD-10-CM | POA: Diagnosis not present

## 2023-01-08 DIAGNOSIS — R001 Bradycardia, unspecified: Secondary | ICD-10-CM | POA: Diagnosis not present

## 2023-01-08 NOTE — Patient Instructions (Signed)
Visit Information  Thank you for taking time to visit with me today. Please don't hesitate to contact me if I can be of assistance to you.   Following are the goals we discussed today:  Patient will await the providers decision on PCS.   If you are experiencing a Mental Health or Behavioral Health Crisis or need someone to talk to, please call 911  Patient verbalizes understanding of instructions and care plan provided today and agrees to view in MyChart. Active MyChart status and patient understanding of how to access instructions and care plan via MyChart confirmed with patient.     No further follow up required: No follow up appointment requested.  Lysle Morales, BSW Social Worker  [phone]

## 2023-01-08 NOTE — Patient Outreach (Signed)
  Care Coordination   Follow Up Visit Note   01/08/2023 Name: Tiffany [name] MRN: [id number] DOB: May 30, 1931  Tiffany [name] is a 89 y.o. year old female who sees Tally Joe, MD for primary care. I spoke with  Tiffany [name] by phone today.  What matters to the patients health and wellness today?  Patient needs personal care.    Goals Addressed             This Visit's Progress    Care Coordination Activities       Interventions Today    Flowsheet Row Most Recent Value  Chronic Disease   Chronic disease during today's visit Diabetes, Congestive Heart Failure (CHF), Hypertension (HTN), Atrial Fibrillation (AFib), Chronic Kidney Disease/End Stage Renal Disease (ESRD), Other  [Pacemaker]  General Interventions   General Interventions Discussed/Reviewed General Interventions Discussed, General Interventions Reviewed  [Pt informed providers office is reviewing PCS forms and explained the process. Pt daughter joins the call and reports pt is now working with Hospice. They will report this information to the Hospice staff.]              SDOH assessments and interventions completed:  No     Care Coordination Interventions:  Yes, provided   Follow up plan: No further intervention required.   Encounter Outcome:  Patient Visit Completed

## 2023-01-11 DIAGNOSIS — R001 Bradycardia, unspecified: Secondary | ICD-10-CM | POA: Diagnosis not present

## 2023-01-11 DIAGNOSIS — I5032 Chronic diastolic (congestive) heart failure: Secondary | ICD-10-CM | POA: Diagnosis not present

## 2023-01-11 DIAGNOSIS — N184 Chronic kidney disease, stage 4 (severe): Secondary | ICD-10-CM | POA: Diagnosis not present

## 2023-01-11 DIAGNOSIS — I129 Hypertensive chronic kidney disease with stage 1 through stage 4 chronic kidney disease, or unspecified chronic kidney disease: Secondary | ICD-10-CM | POA: Diagnosis not present

## 2023-01-11 DIAGNOSIS — I4891 Unspecified atrial fibrillation: Secondary | ICD-10-CM | POA: Diagnosis not present

## 2023-01-11 DIAGNOSIS — Z950 Presence of cardiac pacemaker: Secondary | ICD-10-CM | POA: Diagnosis not present

## 2023-01-15 ENCOUNTER — Ambulatory Visit: Payer: Self-pay | Admitting: *Deleted

## 2023-01-15 DIAGNOSIS — Z950 Presence of cardiac pacemaker: Secondary | ICD-10-CM | POA: Diagnosis not present

## 2023-01-15 DIAGNOSIS — R001 Bradycardia, unspecified: Secondary | ICD-10-CM | POA: Diagnosis not present

## 2023-01-15 DIAGNOSIS — I5032 Chronic diastolic (congestive) heart failure: Secondary | ICD-10-CM | POA: Diagnosis not present

## 2023-01-15 DIAGNOSIS — N184 Chronic kidney disease, stage 4 (severe): Secondary | ICD-10-CM | POA: Diagnosis not present

## 2023-01-15 DIAGNOSIS — I4891 Unspecified atrial fibrillation: Secondary | ICD-10-CM | POA: Diagnosis not present

## 2023-01-15 DIAGNOSIS — I129 Hypertensive chronic kidney disease with stage 1 through stage 4 chronic kidney disease, or unspecified chronic kidney disease: Secondary | ICD-10-CM | POA: Diagnosis not present

## 2023-01-15 NOTE — Patient Outreach (Signed)
  Care Coordination   Follow Up Visit Note   01/15/2023 Name: Tiffany [name] MRN: [id number] DOB: [birth year]  Tiffany [name] is a 89 y.o. year old female who sees Tally Joe, MD for primary care. I spoke with Tiffany [name], daughter of  Tiffany [name] by phone today.  What matters to the patients health and wellness today?  Patient now active with Hospice, [facility].  Daughter report patient now has more resources in the home, has been contacted for meals on wheels and BSW working on [employer].  Denies any urgent concerns, encouraged to contact this care manager with questions.     Goals Addressed             This Visit's Progress    COMPLETED: Effective management of chronic medical conditions   On track    Interventions Today    Flowsheet Row Most Recent Value  Chronic Disease   Chronic disease during today's visit Congestive Heart Failure (CHF), Chronic Kidney Disease/End Stage Renal Disease (ESRD), Diabetes  General Interventions   General Interventions Discussed/Reviewed General Interventions Reviewed  Education Interventions   Education Provided Provided Education  Provided Verbal Education On Community Resources  [BSW assisting with PCS]  Safety Interventions   Safety Discussed/Reviewed Safety Reviewed, Fall Risk  [Daughter will make calls to obtain life alert system]  Advanced Directive Interventions   Advanced Directives Discussed/Reviewed End of Life  End of Life Hospice  [Patient has transitioned to hospice with [facility]]                SDOH assessments and interventions completed:  No     Care Coordination Interventions:  Yes, provided   Follow up plan: No further intervention required.   Encounter Outcome:  Patient Visit Completed   Rodney Langton, RN, MSN, CCM [place]  [facility], [facility] RN Care Coordinator Direct Dial: [phone] / Main [phone] Fax [phone] Email:  [email or web address] Website: [email or web address]

## 2023-01-24 DIAGNOSIS — I4891 Unspecified atrial fibrillation: Secondary | ICD-10-CM | POA: Diagnosis not present

## 2023-01-24 DIAGNOSIS — I129 Hypertensive chronic kidney disease with stage 1 through stage 4 chronic kidney disease, or unspecified chronic kidney disease: Secondary | ICD-10-CM | POA: Diagnosis not present

## 2023-01-24 DIAGNOSIS — R001 Bradycardia, unspecified: Secondary | ICD-10-CM | POA: Diagnosis not present

## 2023-01-24 DIAGNOSIS — I5032 Chronic diastolic (congestive) heart failure: Secondary | ICD-10-CM | POA: Diagnosis not present

## 2023-01-24 DIAGNOSIS — Z950 Presence of cardiac pacemaker: Secondary | ICD-10-CM | POA: Diagnosis not present

## 2023-01-24 DIAGNOSIS — N184 Chronic kidney disease, stage 4 (severe): Secondary | ICD-10-CM | POA: Diagnosis not present

## 2023-01-28 DIAGNOSIS — K219 Gastro-esophageal reflux disease without esophagitis: Secondary | ICD-10-CM | POA: Diagnosis not present

## 2023-01-28 DIAGNOSIS — Z960 Presence of urogenital implants: Secondary | ICD-10-CM | POA: Diagnosis not present

## 2023-01-28 DIAGNOSIS — I4891 Unspecified atrial fibrillation: Secondary | ICD-10-CM | POA: Diagnosis not present

## 2023-01-28 DIAGNOSIS — D631 Anemia in chronic kidney disease: Secondary | ICD-10-CM | POA: Diagnosis not present

## 2023-01-28 DIAGNOSIS — N2581 Secondary hyperparathyroidism of renal origin: Secondary | ICD-10-CM | POA: Diagnosis not present

## 2023-01-28 DIAGNOSIS — N184 Chronic kidney disease, stage 4 (severe): Secondary | ICD-10-CM | POA: Diagnosis not present

## 2023-01-28 DIAGNOSIS — E1142 Type 2 diabetes mellitus with diabetic polyneuropathy: Secondary | ICD-10-CM | POA: Diagnosis not present

## 2023-01-28 DIAGNOSIS — E673 Hypervitaminosis D: Secondary | ICD-10-CM | POA: Diagnosis not present

## 2023-01-28 DIAGNOSIS — I129 Hypertensive chronic kidney disease with stage 1 through stage 4 chronic kidney disease, or unspecified chronic kidney disease: Secondary | ICD-10-CM | POA: Diagnosis not present

## 2023-01-28 DIAGNOSIS — I5032 Chronic diastolic (congestive) heart failure: Secondary | ICD-10-CM | POA: Diagnosis not present

## 2023-01-28 DIAGNOSIS — E1140 Type 2 diabetes mellitus with diabetic neuropathy, unspecified: Secondary | ICD-10-CM | POA: Diagnosis not present

## 2023-01-28 DIAGNOSIS — E039 Hypothyroidism, unspecified: Secondary | ICD-10-CM | POA: Diagnosis not present

## 2023-01-28 DIAGNOSIS — R001 Bradycardia, unspecified: Secondary | ICD-10-CM | POA: Diagnosis not present

## 2023-01-28 DIAGNOSIS — E785 Hyperlipidemia, unspecified: Secondary | ICD-10-CM | POA: Diagnosis not present

## 2023-01-28 DIAGNOSIS — Z950 Presence of cardiac pacemaker: Secondary | ICD-10-CM | POA: Diagnosis not present

## 2023-01-30 DIAGNOSIS — I5032 Chronic diastolic (congestive) heart failure: Secondary | ICD-10-CM | POA: Diagnosis not present

## 2023-01-30 DIAGNOSIS — Z950 Presence of cardiac pacemaker: Secondary | ICD-10-CM | POA: Diagnosis not present

## 2023-01-30 DIAGNOSIS — R001 Bradycardia, unspecified: Secondary | ICD-10-CM | POA: Diagnosis not present

## 2023-01-30 DIAGNOSIS — I4891 Unspecified atrial fibrillation: Secondary | ICD-10-CM | POA: Diagnosis not present

## 2023-01-30 DIAGNOSIS — N184 Chronic kidney disease, stage 4 (severe): Secondary | ICD-10-CM | POA: Diagnosis not present

## 2023-01-30 DIAGNOSIS — I129 Hypertensive chronic kidney disease with stage 1 through stage 4 chronic kidney disease, or unspecified chronic kidney disease: Secondary | ICD-10-CM | POA: Diagnosis not present

## 2023-01-31 DIAGNOSIS — N184 Chronic kidney disease, stage 4 (severe): Secondary | ICD-10-CM | POA: Diagnosis not present

## 2023-01-31 DIAGNOSIS — I129 Hypertensive chronic kidney disease with stage 1 through stage 4 chronic kidney disease, or unspecified chronic kidney disease: Secondary | ICD-10-CM | POA: Diagnosis not present

## 2023-01-31 DIAGNOSIS — R001 Bradycardia, unspecified: Secondary | ICD-10-CM | POA: Diagnosis not present

## 2023-01-31 DIAGNOSIS — I5032 Chronic diastolic (congestive) heart failure: Secondary | ICD-10-CM | POA: Diagnosis not present

## 2023-01-31 DIAGNOSIS — Z950 Presence of cardiac pacemaker: Secondary | ICD-10-CM | POA: Diagnosis not present

## 2023-01-31 DIAGNOSIS — I4891 Unspecified atrial fibrillation: Secondary | ICD-10-CM | POA: Diagnosis not present

## 2023-02-01 DIAGNOSIS — N184 Chronic kidney disease, stage 4 (severe): Secondary | ICD-10-CM | POA: Diagnosis not present

## 2023-02-01 DIAGNOSIS — I129 Hypertensive chronic kidney disease with stage 1 through stage 4 chronic kidney disease, or unspecified chronic kidney disease: Secondary | ICD-10-CM | POA: Diagnosis not present

## 2023-02-01 DIAGNOSIS — R001 Bradycardia, unspecified: Secondary | ICD-10-CM | POA: Diagnosis not present

## 2023-02-01 DIAGNOSIS — I5032 Chronic diastolic (congestive) heart failure: Secondary | ICD-10-CM | POA: Diagnosis not present

## 2023-02-01 DIAGNOSIS — Z950 Presence of cardiac pacemaker: Secondary | ICD-10-CM | POA: Diagnosis not present

## 2023-02-01 DIAGNOSIS — I4891 Unspecified atrial fibrillation: Secondary | ICD-10-CM | POA: Diagnosis not present

## 2023-02-06 DIAGNOSIS — I129 Hypertensive chronic kidney disease with stage 1 through stage 4 chronic kidney disease, or unspecified chronic kidney disease: Secondary | ICD-10-CM | POA: Diagnosis not present

## 2023-02-06 DIAGNOSIS — Z950 Presence of cardiac pacemaker: Secondary | ICD-10-CM | POA: Diagnosis not present

## 2023-02-06 DIAGNOSIS — I4891 Unspecified atrial fibrillation: Secondary | ICD-10-CM | POA: Diagnosis not present

## 2023-02-06 DIAGNOSIS — R001 Bradycardia, unspecified: Secondary | ICD-10-CM | POA: Diagnosis not present

## 2023-02-06 DIAGNOSIS — N184 Chronic kidney disease, stage 4 (severe): Secondary | ICD-10-CM | POA: Diagnosis not present

## 2023-02-06 DIAGNOSIS — I5032 Chronic diastolic (congestive) heart failure: Secondary | ICD-10-CM | POA: Diagnosis not present

## 2023-02-07 DIAGNOSIS — I129 Hypertensive chronic kidney disease with stage 1 through stage 4 chronic kidney disease, or unspecified chronic kidney disease: Secondary | ICD-10-CM | POA: Diagnosis not present

## 2023-02-07 DIAGNOSIS — I5032 Chronic diastolic (congestive) heart failure: Secondary | ICD-10-CM | POA: Diagnosis not present

## 2023-02-07 DIAGNOSIS — R001 Bradycardia, unspecified: Secondary | ICD-10-CM | POA: Diagnosis not present

## 2023-02-07 DIAGNOSIS — N184 Chronic kidney disease, stage 4 (severe): Secondary | ICD-10-CM | POA: Diagnosis not present

## 2023-02-07 DIAGNOSIS — I4891 Unspecified atrial fibrillation: Secondary | ICD-10-CM | POA: Diagnosis not present

## 2023-02-07 DIAGNOSIS — Z950 Presence of cardiac pacemaker: Secondary | ICD-10-CM | POA: Diagnosis not present

## 2023-02-08 DIAGNOSIS — I5032 Chronic diastolic (congestive) heart failure: Secondary | ICD-10-CM | POA: Diagnosis not present

## 2023-02-08 DIAGNOSIS — I4891 Unspecified atrial fibrillation: Secondary | ICD-10-CM | POA: Diagnosis not present

## 2023-02-08 DIAGNOSIS — Z950 Presence of cardiac pacemaker: Secondary | ICD-10-CM | POA: Diagnosis not present

## 2023-02-08 DIAGNOSIS — I129 Hypertensive chronic kidney disease with stage 1 through stage 4 chronic kidney disease, or unspecified chronic kidney disease: Secondary | ICD-10-CM | POA: Diagnosis not present

## 2023-02-08 DIAGNOSIS — R001 Bradycardia, unspecified: Secondary | ICD-10-CM | POA: Diagnosis not present

## 2023-02-08 DIAGNOSIS — N184 Chronic kidney disease, stage 4 (severe): Secondary | ICD-10-CM | POA: Diagnosis not present

## 2023-02-13 DIAGNOSIS — I5032 Chronic diastolic (congestive) heart failure: Secondary | ICD-10-CM | POA: Diagnosis not present

## 2023-02-13 DIAGNOSIS — I129 Hypertensive chronic kidney disease with stage 1 through stage 4 chronic kidney disease, or unspecified chronic kidney disease: Secondary | ICD-10-CM | POA: Diagnosis not present

## 2023-02-13 DIAGNOSIS — N184 Chronic kidney disease, stage 4 (severe): Secondary | ICD-10-CM | POA: Diagnosis not present

## 2023-02-13 DIAGNOSIS — R001 Bradycardia, unspecified: Secondary | ICD-10-CM | POA: Diagnosis not present

## 2023-02-13 DIAGNOSIS — I4891 Unspecified atrial fibrillation: Secondary | ICD-10-CM | POA: Diagnosis not present

## 2023-02-13 DIAGNOSIS — Z950 Presence of cardiac pacemaker: Secondary | ICD-10-CM | POA: Diagnosis not present

## 2023-02-15 DIAGNOSIS — I5032 Chronic diastolic (congestive) heart failure: Secondary | ICD-10-CM | POA: Diagnosis not present

## 2023-02-15 DIAGNOSIS — N184 Chronic kidney disease, stage 4 (severe): Secondary | ICD-10-CM | POA: Diagnosis not present

## 2023-02-15 DIAGNOSIS — I4891 Unspecified atrial fibrillation: Secondary | ICD-10-CM | POA: Diagnosis not present

## 2023-02-15 DIAGNOSIS — Z950 Presence of cardiac pacemaker: Secondary | ICD-10-CM | POA: Diagnosis not present

## 2023-02-15 DIAGNOSIS — R001 Bradycardia, unspecified: Secondary | ICD-10-CM | POA: Diagnosis not present

## 2023-02-15 DIAGNOSIS — I129 Hypertensive chronic kidney disease with stage 1 through stage 4 chronic kidney disease, or unspecified chronic kidney disease: Secondary | ICD-10-CM | POA: Diagnosis not present

## 2023-02-20 DIAGNOSIS — Z950 Presence of cardiac pacemaker: Secondary | ICD-10-CM | POA: Diagnosis not present

## 2023-02-20 DIAGNOSIS — I4891 Unspecified atrial fibrillation: Secondary | ICD-10-CM | POA: Diagnosis not present

## 2023-02-20 DIAGNOSIS — I129 Hypertensive chronic kidney disease with stage 1 through stage 4 chronic kidney disease, or unspecified chronic kidney disease: Secondary | ICD-10-CM | POA: Diagnosis not present

## 2023-02-20 DIAGNOSIS — R001 Bradycardia, unspecified: Secondary | ICD-10-CM | POA: Diagnosis not present

## 2023-02-20 DIAGNOSIS — N184 Chronic kidney disease, stage 4 (severe): Secondary | ICD-10-CM | POA: Diagnosis not present

## 2023-02-20 DIAGNOSIS — I5032 Chronic diastolic (congestive) heart failure: Secondary | ICD-10-CM | POA: Diagnosis not present

## 2023-02-22 DIAGNOSIS — R001 Bradycardia, unspecified: Secondary | ICD-10-CM | POA: Diagnosis not present

## 2023-02-22 DIAGNOSIS — I129 Hypertensive chronic kidney disease with stage 1 through stage 4 chronic kidney disease, or unspecified chronic kidney disease: Secondary | ICD-10-CM | POA: Diagnosis not present

## 2023-02-22 DIAGNOSIS — I4891 Unspecified atrial fibrillation: Secondary | ICD-10-CM | POA: Diagnosis not present

## 2023-02-22 DIAGNOSIS — Z950 Presence of cardiac pacemaker: Secondary | ICD-10-CM | POA: Diagnosis not present

## 2023-02-22 DIAGNOSIS — N184 Chronic kidney disease, stage 4 (severe): Secondary | ICD-10-CM | POA: Diagnosis not present

## 2023-02-22 DIAGNOSIS — I5032 Chronic diastolic (congestive) heart failure: Secondary | ICD-10-CM | POA: Diagnosis not present

## 2023-02-27 DIAGNOSIS — I4891 Unspecified atrial fibrillation: Secondary | ICD-10-CM | POA: Diagnosis not present

## 2023-02-27 DIAGNOSIS — Z950 Presence of cardiac pacemaker: Secondary | ICD-10-CM | POA: Diagnosis not present

## 2023-02-27 DIAGNOSIS — R001 Bradycardia, unspecified: Secondary | ICD-10-CM | POA: Diagnosis not present

## 2023-02-27 DIAGNOSIS — N184 Chronic kidney disease, stage 4 (severe): Secondary | ICD-10-CM | POA: Diagnosis not present

## 2023-02-27 DIAGNOSIS — I5032 Chronic diastolic (congestive) heart failure: Secondary | ICD-10-CM | POA: Diagnosis not present

## 2023-02-27 DIAGNOSIS — I129 Hypertensive chronic kidney disease with stage 1 through stage 4 chronic kidney disease, or unspecified chronic kidney disease: Secondary | ICD-10-CM | POA: Diagnosis not present

## 2023-02-28 DIAGNOSIS — E1140 Type 2 diabetes mellitus with diabetic neuropathy, unspecified: Secondary | ICD-10-CM | POA: Diagnosis not present

## 2023-02-28 DIAGNOSIS — E673 Hypervitaminosis D: Secondary | ICD-10-CM | POA: Diagnosis not present

## 2023-02-28 DIAGNOSIS — Z960 Presence of urogenital implants: Secondary | ICD-10-CM | POA: Diagnosis not present

## 2023-02-28 DIAGNOSIS — Z950 Presence of cardiac pacemaker: Secondary | ICD-10-CM | POA: Diagnosis not present

## 2023-02-28 DIAGNOSIS — D631 Anemia in chronic kidney disease: Secondary | ICD-10-CM | POA: Diagnosis not present

## 2023-02-28 DIAGNOSIS — K219 Gastro-esophageal reflux disease without esophagitis: Secondary | ICD-10-CM | POA: Diagnosis not present

## 2023-02-28 DIAGNOSIS — E1142 Type 2 diabetes mellitus with diabetic polyneuropathy: Secondary | ICD-10-CM | POA: Diagnosis not present

## 2023-02-28 DIAGNOSIS — R001 Bradycardia, unspecified: Secondary | ICD-10-CM | POA: Diagnosis not present

## 2023-02-28 DIAGNOSIS — N184 Chronic kidney disease, stage 4 (severe): Secondary | ICD-10-CM | POA: Diagnosis not present

## 2023-02-28 DIAGNOSIS — I5032 Chronic diastolic (congestive) heart failure: Secondary | ICD-10-CM | POA: Diagnosis not present

## 2023-02-28 DIAGNOSIS — N2581 Secondary hyperparathyroidism of renal origin: Secondary | ICD-10-CM | POA: Diagnosis not present

## 2023-02-28 DIAGNOSIS — E785 Hyperlipidemia, unspecified: Secondary | ICD-10-CM | POA: Diagnosis not present

## 2023-02-28 DIAGNOSIS — E039 Hypothyroidism, unspecified: Secondary | ICD-10-CM | POA: Diagnosis not present

## 2023-02-28 DIAGNOSIS — I4891 Unspecified atrial fibrillation: Secondary | ICD-10-CM | POA: Diagnosis not present

## 2023-02-28 DIAGNOSIS — I129 Hypertensive chronic kidney disease with stage 1 through stage 4 chronic kidney disease, or unspecified chronic kidney disease: Secondary | ICD-10-CM | POA: Diagnosis not present

## 2023-03-01 DIAGNOSIS — I4891 Unspecified atrial fibrillation: Secondary | ICD-10-CM | POA: Diagnosis not present

## 2023-03-01 DIAGNOSIS — I129 Hypertensive chronic kidney disease with stage 1 through stage 4 chronic kidney disease, or unspecified chronic kidney disease: Secondary | ICD-10-CM | POA: Diagnosis not present

## 2023-03-01 DIAGNOSIS — N184 Chronic kidney disease, stage 4 (severe): Secondary | ICD-10-CM | POA: Diagnosis not present

## 2023-03-01 DIAGNOSIS — Z950 Presence of cardiac pacemaker: Secondary | ICD-10-CM | POA: Diagnosis not present

## 2023-03-01 DIAGNOSIS — I5032 Chronic diastolic (congestive) heart failure: Secondary | ICD-10-CM | POA: Diagnosis not present

## 2023-03-01 DIAGNOSIS — R001 Bradycardia, unspecified: Secondary | ICD-10-CM | POA: Diagnosis not present

## 2023-03-06 DIAGNOSIS — Z950 Presence of cardiac pacemaker: Secondary | ICD-10-CM | POA: Diagnosis not present

## 2023-03-06 DIAGNOSIS — I129 Hypertensive chronic kidney disease with stage 1 through stage 4 chronic kidney disease, or unspecified chronic kidney disease: Secondary | ICD-10-CM | POA: Diagnosis not present

## 2023-03-06 DIAGNOSIS — R001 Bradycardia, unspecified: Secondary | ICD-10-CM | POA: Diagnosis not present

## 2023-03-06 DIAGNOSIS — I5032 Chronic diastolic (congestive) heart failure: Secondary | ICD-10-CM | POA: Diagnosis not present

## 2023-03-06 DIAGNOSIS — N184 Chronic kidney disease, stage 4 (severe): Secondary | ICD-10-CM | POA: Diagnosis not present

## 2023-03-06 DIAGNOSIS — I4891 Unspecified atrial fibrillation: Secondary | ICD-10-CM | POA: Diagnosis not present

## 2023-03-08 DIAGNOSIS — N184 Chronic kidney disease, stage 4 (severe): Secondary | ICD-10-CM | POA: Diagnosis not present

## 2023-03-08 DIAGNOSIS — R001 Bradycardia, unspecified: Secondary | ICD-10-CM | POA: Diagnosis not present

## 2023-03-08 DIAGNOSIS — I5032 Chronic diastolic (congestive) heart failure: Secondary | ICD-10-CM | POA: Diagnosis not present

## 2023-03-08 DIAGNOSIS — I129 Hypertensive chronic kidney disease with stage 1 through stage 4 chronic kidney disease, or unspecified chronic kidney disease: Secondary | ICD-10-CM | POA: Diagnosis not present

## 2023-03-08 DIAGNOSIS — Z950 Presence of cardiac pacemaker: Secondary | ICD-10-CM | POA: Diagnosis not present

## 2023-03-08 DIAGNOSIS — I4891 Unspecified atrial fibrillation: Secondary | ICD-10-CM | POA: Diagnosis not present

## 2023-03-13 DIAGNOSIS — I5032 Chronic diastolic (congestive) heart failure: Secondary | ICD-10-CM | POA: Diagnosis not present

## 2023-03-13 DIAGNOSIS — I129 Hypertensive chronic kidney disease with stage 1 through stage 4 chronic kidney disease, or unspecified chronic kidney disease: Secondary | ICD-10-CM | POA: Diagnosis not present

## 2023-03-13 DIAGNOSIS — I4891 Unspecified atrial fibrillation: Secondary | ICD-10-CM | POA: Diagnosis not present

## 2023-03-13 DIAGNOSIS — R001 Bradycardia, unspecified: Secondary | ICD-10-CM | POA: Diagnosis not present

## 2023-03-13 DIAGNOSIS — N184 Chronic kidney disease, stage 4 (severe): Secondary | ICD-10-CM | POA: Diagnosis not present

## 2023-03-13 DIAGNOSIS — Z950 Presence of cardiac pacemaker: Secondary | ICD-10-CM | POA: Diagnosis not present

## 2023-03-14 DIAGNOSIS — I129 Hypertensive chronic kidney disease with stage 1 through stage 4 chronic kidney disease, or unspecified chronic kidney disease: Secondary | ICD-10-CM | POA: Diagnosis not present

## 2023-03-14 DIAGNOSIS — I5032 Chronic diastolic (congestive) heart failure: Secondary | ICD-10-CM | POA: Diagnosis not present

## 2023-03-14 DIAGNOSIS — R001 Bradycardia, unspecified: Secondary | ICD-10-CM | POA: Diagnosis not present

## 2023-03-14 DIAGNOSIS — I4891 Unspecified atrial fibrillation: Secondary | ICD-10-CM | POA: Diagnosis not present

## 2023-03-14 DIAGNOSIS — N184 Chronic kidney disease, stage 4 (severe): Secondary | ICD-10-CM | POA: Diagnosis not present

## 2023-03-14 DIAGNOSIS — Z950 Presence of cardiac pacemaker: Secondary | ICD-10-CM | POA: Diagnosis not present

## 2023-03-15 DIAGNOSIS — R001 Bradycardia, unspecified: Secondary | ICD-10-CM | POA: Diagnosis not present

## 2023-03-15 DIAGNOSIS — Z950 Presence of cardiac pacemaker: Secondary | ICD-10-CM | POA: Diagnosis not present

## 2023-03-15 DIAGNOSIS — I4891 Unspecified atrial fibrillation: Secondary | ICD-10-CM | POA: Diagnosis not present

## 2023-03-15 DIAGNOSIS — I5032 Chronic diastolic (congestive) heart failure: Secondary | ICD-10-CM | POA: Diagnosis not present

## 2023-03-15 DIAGNOSIS — N184 Chronic kidney disease, stage 4 (severe): Secondary | ICD-10-CM | POA: Diagnosis not present

## 2023-03-15 DIAGNOSIS — I129 Hypertensive chronic kidney disease with stage 1 through stage 4 chronic kidney disease, or unspecified chronic kidney disease: Secondary | ICD-10-CM | POA: Diagnosis not present

## 2023-03-20 ENCOUNTER — Ambulatory Visit (INDEPENDENT_AMBULATORY_CARE_PROVIDER_SITE_OTHER): Payer: PRIVATE HEALTH INSURANCE

## 2023-03-20 DIAGNOSIS — N184 Chronic kidney disease, stage 4 (severe): Secondary | ICD-10-CM | POA: Diagnosis not present

## 2023-03-20 DIAGNOSIS — Z950 Presence of cardiac pacemaker: Secondary | ICD-10-CM | POA: Diagnosis not present

## 2023-03-20 DIAGNOSIS — I495 Sick sinus syndrome: Secondary | ICD-10-CM

## 2023-03-20 DIAGNOSIS — I4891 Unspecified atrial fibrillation: Secondary | ICD-10-CM | POA: Diagnosis not present

## 2023-03-20 DIAGNOSIS — I129 Hypertensive chronic kidney disease with stage 1 through stage 4 chronic kidney disease, or unspecified chronic kidney disease: Secondary | ICD-10-CM | POA: Diagnosis not present

## 2023-03-20 DIAGNOSIS — I5032 Chronic diastolic (congestive) heart failure: Secondary | ICD-10-CM | POA: Diagnosis not present

## 2023-03-20 DIAGNOSIS — R001 Bradycardia, unspecified: Secondary | ICD-10-CM | POA: Diagnosis not present

## 2023-03-20 LAB — CUP PACEART REMOTE DEVICE CHECK
Battery Remaining Longevity: 40 mo
Battery Voltage: 2.94 V
Brady Statistic AP VP Percent: 99.85 %
Brady Statistic AP VS Percent: 0.01 %
Brady Statistic AS VP Percent: 0 %
Brady Statistic AS VS Percent: 0.15 %
Brady Statistic RA Percent Paced: 100 %
Brady Statistic RV Percent Paced: 99.85 %
Date Time Interrogation Session: 20250121033611
Implantable Lead Connection Status: 753985
Implantable Lead Connection Status: 753985
Implantable Lead Implant Date: 19990907
Implantable Lead Implant Date: 19990907
Implantable Lead Location: 753859
Implantable Lead Location: 753860
Implantable Lead Serial Number: 32904
Implantable Lead Serial Number: 33421
Implantable Pulse Generator Implant Date: 20211014
Lead Channel Impedance Value: 209 Ohm
Lead Channel Impedance Value: 228 Ohm
Lead Channel Impedance Value: 228 Ohm
Lead Channel Impedance Value: 228 Ohm
Lead Channel Pacing Threshold Amplitude: 0.875 V
Lead Channel Pacing Threshold Pulse Width: 0.4 ms
Lead Channel Sensing Intrinsic Amplitude: 12.625 mV
Lead Channel Sensing Intrinsic Amplitude: 12.625 mV
Lead Channel Setting Pacing Amplitude: 2 V
Lead Channel Setting Pacing Amplitude: 2.5 V
Lead Channel Setting Pacing Pulse Width: 0.4 ms
Lead Channel Setting Sensing Sensitivity: 8 mV
Zone Setting Status: 755011
Zone Setting Status: 755011

## 2023-03-22 DIAGNOSIS — I5032 Chronic diastolic (congestive) heart failure: Secondary | ICD-10-CM | POA: Diagnosis not present

## 2023-03-22 DIAGNOSIS — I129 Hypertensive chronic kidney disease with stage 1 through stage 4 chronic kidney disease, or unspecified chronic kidney disease: Secondary | ICD-10-CM | POA: Diagnosis not present

## 2023-03-22 DIAGNOSIS — I4891 Unspecified atrial fibrillation: Secondary | ICD-10-CM | POA: Diagnosis not present

## 2023-03-22 DIAGNOSIS — Z950 Presence of cardiac pacemaker: Secondary | ICD-10-CM | POA: Diagnosis not present

## 2023-03-22 DIAGNOSIS — R001 Bradycardia, unspecified: Secondary | ICD-10-CM | POA: Diagnosis not present

## 2023-03-22 DIAGNOSIS — N184 Chronic kidney disease, stage 4 (severe): Secondary | ICD-10-CM | POA: Diagnosis not present

## 2023-03-26 DIAGNOSIS — N184 Chronic kidney disease, stage 4 (severe): Secondary | ICD-10-CM | POA: Diagnosis not present

## 2023-03-26 DIAGNOSIS — I5032 Chronic diastolic (congestive) heart failure: Secondary | ICD-10-CM | POA: Diagnosis not present

## 2023-03-26 DIAGNOSIS — R001 Bradycardia, unspecified: Secondary | ICD-10-CM | POA: Diagnosis not present

## 2023-03-26 DIAGNOSIS — Z950 Presence of cardiac pacemaker: Secondary | ICD-10-CM | POA: Diagnosis not present

## 2023-03-26 DIAGNOSIS — I4891 Unspecified atrial fibrillation: Secondary | ICD-10-CM | POA: Diagnosis not present

## 2023-03-26 DIAGNOSIS — I129 Hypertensive chronic kidney disease with stage 1 through stage 4 chronic kidney disease, or unspecified chronic kidney disease: Secondary | ICD-10-CM | POA: Diagnosis not present

## 2023-03-28 DIAGNOSIS — I129 Hypertensive chronic kidney disease with stage 1 through stage 4 chronic kidney disease, or unspecified chronic kidney disease: Secondary | ICD-10-CM | POA: Diagnosis not present

## 2023-03-28 DIAGNOSIS — Z950 Presence of cardiac pacemaker: Secondary | ICD-10-CM | POA: Diagnosis not present

## 2023-03-28 DIAGNOSIS — N184 Chronic kidney disease, stage 4 (severe): Secondary | ICD-10-CM | POA: Diagnosis not present

## 2023-03-28 DIAGNOSIS — I5032 Chronic diastolic (congestive) heart failure: Secondary | ICD-10-CM | POA: Diagnosis not present

## 2023-03-28 DIAGNOSIS — I4891 Unspecified atrial fibrillation: Secondary | ICD-10-CM | POA: Diagnosis not present

## 2023-03-28 DIAGNOSIS — R001 Bradycardia, unspecified: Secondary | ICD-10-CM | POA: Diagnosis not present

## 2023-03-29 DIAGNOSIS — I129 Hypertensive chronic kidney disease with stage 1 through stage 4 chronic kidney disease, or unspecified chronic kidney disease: Secondary | ICD-10-CM | POA: Diagnosis not present

## 2023-03-29 DIAGNOSIS — Z950 Presence of cardiac pacemaker: Secondary | ICD-10-CM | POA: Diagnosis not present

## 2023-03-29 DIAGNOSIS — I4891 Unspecified atrial fibrillation: Secondary | ICD-10-CM | POA: Diagnosis not present

## 2023-03-29 DIAGNOSIS — N184 Chronic kidney disease, stage 4 (severe): Secondary | ICD-10-CM | POA: Diagnosis not present

## 2023-03-29 DIAGNOSIS — R001 Bradycardia, unspecified: Secondary | ICD-10-CM | POA: Diagnosis not present

## 2023-03-29 DIAGNOSIS — I5032 Chronic diastolic (congestive) heart failure: Secondary | ICD-10-CM | POA: Diagnosis not present

## 2023-03-30 DIAGNOSIS — N184 Chronic kidney disease, stage 4 (severe): Secondary | ICD-10-CM | POA: Diagnosis not present

## 2023-03-30 DIAGNOSIS — I5032 Chronic diastolic (congestive) heart failure: Secondary | ICD-10-CM | POA: Diagnosis not present

## 2023-03-30 DIAGNOSIS — I129 Hypertensive chronic kidney disease with stage 1 through stage 4 chronic kidney disease, or unspecified chronic kidney disease: Secondary | ICD-10-CM | POA: Diagnosis not present

## 2023-03-30 DIAGNOSIS — I4891 Unspecified atrial fibrillation: Secondary | ICD-10-CM | POA: Diagnosis not present

## 2023-03-30 DIAGNOSIS — R001 Bradycardia, unspecified: Secondary | ICD-10-CM | POA: Diagnosis not present

## 2023-03-30 DIAGNOSIS — Z950 Presence of cardiac pacemaker: Secondary | ICD-10-CM | POA: Diagnosis not present

## 2023-03-31 DIAGNOSIS — E785 Hyperlipidemia, unspecified: Secondary | ICD-10-CM | POA: Diagnosis not present

## 2023-03-31 DIAGNOSIS — D631 Anemia in chronic kidney disease: Secondary | ICD-10-CM | POA: Diagnosis not present

## 2023-03-31 DIAGNOSIS — E1142 Type 2 diabetes mellitus with diabetic polyneuropathy: Secondary | ICD-10-CM | POA: Diagnosis not present

## 2023-03-31 DIAGNOSIS — Z950 Presence of cardiac pacemaker: Secondary | ICD-10-CM | POA: Diagnosis not present

## 2023-03-31 DIAGNOSIS — N2581 Secondary hyperparathyroidism of renal origin: Secondary | ICD-10-CM | POA: Diagnosis not present

## 2023-03-31 DIAGNOSIS — N184 Chronic kidney disease, stage 4 (severe): Secondary | ICD-10-CM | POA: Diagnosis not present

## 2023-03-31 DIAGNOSIS — Z960 Presence of urogenital implants: Secondary | ICD-10-CM | POA: Diagnosis not present

## 2023-03-31 DIAGNOSIS — E673 Hypervitaminosis D: Secondary | ICD-10-CM | POA: Diagnosis not present

## 2023-03-31 DIAGNOSIS — E039 Hypothyroidism, unspecified: Secondary | ICD-10-CM | POA: Diagnosis not present

## 2023-03-31 DIAGNOSIS — I4891 Unspecified atrial fibrillation: Secondary | ICD-10-CM | POA: Diagnosis not present

## 2023-03-31 DIAGNOSIS — K219 Gastro-esophageal reflux disease without esophagitis: Secondary | ICD-10-CM | POA: Diagnosis not present

## 2023-03-31 DIAGNOSIS — I5032 Chronic diastolic (congestive) heart failure: Secondary | ICD-10-CM | POA: Diagnosis not present

## 2023-03-31 DIAGNOSIS — R001 Bradycardia, unspecified: Secondary | ICD-10-CM | POA: Diagnosis not present

## 2023-03-31 DIAGNOSIS — I129 Hypertensive chronic kidney disease with stage 1 through stage 4 chronic kidney disease, or unspecified chronic kidney disease: Secondary | ICD-10-CM | POA: Diagnosis not present

## 2023-03-31 DIAGNOSIS — E1140 Type 2 diabetes mellitus with diabetic neuropathy, unspecified: Secondary | ICD-10-CM | POA: Diagnosis not present

## 2023-04-01 DIAGNOSIS — I4891 Unspecified atrial fibrillation: Secondary | ICD-10-CM | POA: Diagnosis not present

## 2023-04-01 DIAGNOSIS — I129 Hypertensive chronic kidney disease with stage 1 through stage 4 chronic kidney disease, or unspecified chronic kidney disease: Secondary | ICD-10-CM | POA: Diagnosis not present

## 2023-04-01 DIAGNOSIS — Z950 Presence of cardiac pacemaker: Secondary | ICD-10-CM | POA: Diagnosis not present

## 2023-04-01 DIAGNOSIS — R001 Bradycardia, unspecified: Secondary | ICD-10-CM | POA: Diagnosis not present

## 2023-04-01 DIAGNOSIS — I5032 Chronic diastolic (congestive) heart failure: Secondary | ICD-10-CM | POA: Diagnosis not present

## 2023-04-01 DIAGNOSIS — N184 Chronic kidney disease, stage 4 (severe): Secondary | ICD-10-CM | POA: Diagnosis not present

## 2023-04-05 ENCOUNTER — Encounter: Payer: Self-pay | Admitting: Cardiovascular Disease

## 2023-04-06 ENCOUNTER — Telehealth: Payer: Self-pay

## 2023-04-06 NOTE — Telephone Encounter (Signed)
 Pt daughter states she has passed away on 04/11/23.   I canceled all upcoming remotes. Marked her deceased in [place] and took her out of [facility]. I also ordered a return kit for the pt monitor to be returned. Her daughter should receive it in 7-10 business days.

## 2023-04-28 DEATH — deceased

## 2023-04-30 NOTE — Progress Notes (Signed)
 Remote pacemaker transmission.

## 2023-06-19 ENCOUNTER — Ambulatory Visit: Payer: Medicare Other

## 2023-09-18 ENCOUNTER — Ambulatory Visit: Payer: Medicare Other

## 2023-12-18 ENCOUNTER — Ambulatory Visit: Payer: Medicare Other
